# Patient Record
Sex: Male | Born: 1955
Health system: Southern US, Community
[De-identification: ages and names within clinical notes are randomized; demographics above are authoritative.]

## PROBLEM LIST (undated history)

## (undated) DIAGNOSIS — I509 Heart failure, unspecified: Secondary | ICD-10-CM

## (undated) DIAGNOSIS — E785 Hyperlipidemia, unspecified: Secondary | ICD-10-CM

## (undated) DIAGNOSIS — M545 Low back pain, unspecified: Secondary | ICD-10-CM

## (undated) DIAGNOSIS — G4733 Obstructive sleep apnea (adult) (pediatric): Secondary | ICD-10-CM

## (undated) DIAGNOSIS — R51 Headache: Secondary | ICD-10-CM

## (undated) DIAGNOSIS — R519 Headache, unspecified: Secondary | ICD-10-CM

## (undated) DIAGNOSIS — D126 Benign neoplasm of colon, unspecified: Secondary | ICD-10-CM

## (undated) DIAGNOSIS — M1990 Unspecified osteoarthritis, unspecified site: Secondary | ICD-10-CM

## (undated) DIAGNOSIS — E119 Type 2 diabetes mellitus without complications: Secondary | ICD-10-CM

## (undated) DIAGNOSIS — N529 Male erectile dysfunction, unspecified: Secondary | ICD-10-CM

## (undated) DIAGNOSIS — I10 Essential (primary) hypertension: Secondary | ICD-10-CM

## (undated) DIAGNOSIS — L03119 Cellulitis of unspecified part of limb: Secondary | ICD-10-CM

## (undated) DIAGNOSIS — M109 Gout, unspecified: Secondary | ICD-10-CM

## (undated) DIAGNOSIS — K5730 Diverticulosis of large intestine without perforation or abscess without bleeding: Secondary | ICD-10-CM

## (undated) DIAGNOSIS — L02419 Cutaneous abscess of limb, unspecified: Secondary | ICD-10-CM

## (undated) DIAGNOSIS — L0390 Cellulitis, unspecified: Secondary | ICD-10-CM

## (undated) HISTORY — DX: Cellulitis of unspecified part of limb: L03.119

## (undated) HISTORY — DX: Diverticulosis of large intestine without perforation or abscess without bleeding: K57.30

## (undated) HISTORY — DX: Hyperlipidemia, unspecified: E78.5

## (undated) HISTORY — DX: Low back pain, unspecified: M54.50

## (undated) HISTORY — DX: Obstructive sleep apnea (adult) (pediatric): G47.33

## (undated) HISTORY — DX: Morbid (severe) obesity due to excess calories: E66.01

## (undated) HISTORY — DX: Heart failure, unspecified: I50.9

## (undated) HISTORY — DX: Type 2 diabetes mellitus without complications: E11.9

## (undated) HISTORY — DX: Low back pain: M54.5

## (undated) HISTORY — PX: FOOT SURGERY: SHX648

## (undated) HISTORY — DX: Cutaneous abscess of limb, unspecified: L02.419

## (undated) HISTORY — DX: Gout, unspecified: M10.9

## (undated) HISTORY — DX: Essential (primary) hypertension: I10

## (undated) HISTORY — DX: Male erectile dysfunction, unspecified: N52.9

## (undated) HISTORY — PX: APPENDECTOMY: SHX54

## (undated) HISTORY — DX: Benign neoplasm of colon, unspecified: D12.6

---

## 2000-11-02 ENCOUNTER — Encounter: Payer: Self-pay | Admitting: Pulmonary Disease

## 2000-11-02 ENCOUNTER — Ambulatory Visit (HOSPITAL_BASED_OUTPATIENT_CLINIC_OR_DEPARTMENT_OTHER): Admission: RE | Admit: 2000-11-02 | Discharge: 2000-11-02 | Payer: Self-pay | Admitting: Internal Medicine

## 2000-12-01 ENCOUNTER — Encounter: Payer: Self-pay | Admitting: Pulmonary Disease

## 2001-02-16 ENCOUNTER — Encounter: Payer: Self-pay | Admitting: Occupational Medicine

## 2001-02-16 ENCOUNTER — Encounter: Admission: RE | Admit: 2001-02-16 | Discharge: 2001-02-16 | Payer: Self-pay | Admitting: Occupational Medicine

## 2001-05-18 ENCOUNTER — Inpatient Hospital Stay (HOSPITAL_COMMUNITY): Admission: EM | Admit: 2001-05-18 | Discharge: 2001-05-23 | Payer: Self-pay | Admitting: *Deleted

## 2001-05-18 ENCOUNTER — Encounter: Payer: Self-pay | Admitting: Internal Medicine

## 2001-05-30 ENCOUNTER — Encounter: Admission: RE | Admit: 2001-05-30 | Discharge: 2001-08-28 | Payer: Self-pay | Admitting: Internal Medicine

## 2002-05-15 ENCOUNTER — Inpatient Hospital Stay (HOSPITAL_COMMUNITY): Admission: EM | Admit: 2002-05-15 | Discharge: 2002-05-17 | Payer: Self-pay | Admitting: *Deleted

## 2003-07-06 ENCOUNTER — Ambulatory Visit (HOSPITAL_BASED_OUTPATIENT_CLINIC_OR_DEPARTMENT_OTHER): Admission: RE | Admit: 2003-07-06 | Discharge: 2003-07-06 | Payer: Self-pay | Admitting: Pulmonary Disease

## 2003-07-06 ENCOUNTER — Encounter: Payer: Self-pay | Admitting: Pulmonary Disease

## 2004-12-12 ENCOUNTER — Ambulatory Visit: Payer: Self-pay | Admitting: Internal Medicine

## 2004-12-16 ENCOUNTER — Inpatient Hospital Stay (HOSPITAL_COMMUNITY): Admission: EM | Admit: 2004-12-16 | Discharge: 2004-12-22 | Payer: Self-pay | Admitting: Internal Medicine

## 2004-12-16 ENCOUNTER — Ambulatory Visit: Payer: Self-pay | Admitting: Internal Medicine

## 2004-12-21 ENCOUNTER — Ambulatory Visit: Payer: Self-pay | Admitting: Internal Medicine

## 2004-12-31 ENCOUNTER — Ambulatory Visit: Payer: Self-pay | Admitting: Internal Medicine

## 2005-01-14 ENCOUNTER — Ambulatory Visit: Payer: Self-pay | Admitting: Internal Medicine

## 2005-01-26 ENCOUNTER — Ambulatory Visit: Payer: Self-pay | Admitting: Internal Medicine

## 2005-09-28 ENCOUNTER — Ambulatory Visit: Payer: Self-pay | Admitting: Internal Medicine

## 2005-11-05 ENCOUNTER — Ambulatory Visit: Payer: Self-pay | Admitting: Internal Medicine

## 2005-12-29 ENCOUNTER — Ambulatory Visit: Payer: Self-pay | Admitting: Internal Medicine

## 2006-01-04 ENCOUNTER — Ambulatory Visit: Payer: Self-pay | Admitting: Internal Medicine

## 2006-01-19 ENCOUNTER — Ambulatory Visit: Payer: Self-pay | Admitting: Internal Medicine

## 2006-01-25 ENCOUNTER — Ambulatory Visit: Payer: Self-pay | Admitting: Gastroenterology

## 2006-02-11 ENCOUNTER — Ambulatory Visit (HOSPITAL_COMMUNITY): Admission: RE | Admit: 2006-02-11 | Discharge: 2006-02-11 | Payer: Self-pay | Admitting: Internal Medicine

## 2006-02-11 ENCOUNTER — Encounter (INDEPENDENT_AMBULATORY_CARE_PROVIDER_SITE_OTHER): Payer: Self-pay | Admitting: *Deleted

## 2006-02-16 ENCOUNTER — Ambulatory Visit: Payer: Self-pay | Admitting: Internal Medicine

## 2006-03-02 ENCOUNTER — Ambulatory Visit: Payer: Self-pay | Admitting: Internal Medicine

## 2007-02-28 ENCOUNTER — Ambulatory Visit: Payer: Self-pay | Admitting: Internal Medicine

## 2007-03-07 ENCOUNTER — Encounter: Payer: Self-pay | Admitting: Internal Medicine

## 2007-03-07 ENCOUNTER — Ambulatory Visit (HOSPITAL_COMMUNITY): Admission: RE | Admit: 2007-03-07 | Discharge: 2007-03-07 | Payer: Self-pay | Admitting: Internal Medicine

## 2007-03-09 ENCOUNTER — Ambulatory Visit: Payer: Self-pay | Admitting: Internal Medicine

## 2007-05-21 ENCOUNTER — Encounter: Payer: Self-pay | Admitting: Internal Medicine

## 2007-05-21 DIAGNOSIS — E669 Obesity, unspecified: Secondary | ICD-10-CM | POA: Insufficient documentation

## 2007-05-21 DIAGNOSIS — I10 Essential (primary) hypertension: Secondary | ICD-10-CM

## 2007-05-21 DIAGNOSIS — E119 Type 2 diabetes mellitus without complications: Secondary | ICD-10-CM | POA: Insufficient documentation

## 2007-05-21 DIAGNOSIS — G4733 Obstructive sleep apnea (adult) (pediatric): Secondary | ICD-10-CM | POA: Insufficient documentation

## 2007-05-21 DIAGNOSIS — I509 Heart failure, unspecified: Secondary | ICD-10-CM | POA: Insufficient documentation

## 2007-05-21 DIAGNOSIS — F528 Other sexual dysfunction not due to a substance or known physiological condition: Secondary | ICD-10-CM | POA: Insufficient documentation

## 2007-05-21 DIAGNOSIS — M545 Low back pain: Secondary | ICD-10-CM

## 2007-05-21 DIAGNOSIS — M109 Gout, unspecified: Secondary | ICD-10-CM | POA: Insufficient documentation

## 2007-05-21 DIAGNOSIS — E1169 Type 2 diabetes mellitus with other specified complication: Secondary | ICD-10-CM | POA: Insufficient documentation

## 2007-05-21 DIAGNOSIS — E1122 Type 2 diabetes mellitus with diabetic chronic kidney disease: Secondary | ICD-10-CM | POA: Insufficient documentation

## 2007-06-06 ENCOUNTER — Ambulatory Visit: Payer: Self-pay | Admitting: Internal Medicine

## 2007-06-27 ENCOUNTER — Ambulatory Visit: Payer: Self-pay | Admitting: Internal Medicine

## 2007-06-27 ENCOUNTER — Encounter: Payer: Self-pay | Admitting: Internal Medicine

## 2007-06-27 DIAGNOSIS — L02419 Cutaneous abscess of limb, unspecified: Secondary | ICD-10-CM

## 2007-06-27 DIAGNOSIS — E785 Hyperlipidemia, unspecified: Secondary | ICD-10-CM | POA: Insufficient documentation

## 2007-06-27 DIAGNOSIS — L03119 Cellulitis of unspecified part of limb: Secondary | ICD-10-CM | POA: Insufficient documentation

## 2007-06-27 HISTORY — DX: Cellulitis of unspecified part of limb: L02.419

## 2007-11-10 ENCOUNTER — Telehealth (INDEPENDENT_AMBULATORY_CARE_PROVIDER_SITE_OTHER): Payer: Self-pay | Admitting: *Deleted

## 2008-03-08 ENCOUNTER — Telehealth (INDEPENDENT_AMBULATORY_CARE_PROVIDER_SITE_OTHER): Payer: Self-pay | Admitting: *Deleted

## 2008-03-13 ENCOUNTER — Telehealth (INDEPENDENT_AMBULATORY_CARE_PROVIDER_SITE_OTHER): Payer: Self-pay | Admitting: *Deleted

## 2008-03-14 ENCOUNTER — Encounter: Payer: Self-pay | Admitting: Internal Medicine

## 2008-03-14 ENCOUNTER — Telehealth (INDEPENDENT_AMBULATORY_CARE_PROVIDER_SITE_OTHER): Payer: Self-pay | Admitting: *Deleted

## 2008-03-14 DIAGNOSIS — D126 Benign neoplasm of colon, unspecified: Secondary | ICD-10-CM | POA: Insufficient documentation

## 2008-03-14 DIAGNOSIS — K5730 Diverticulosis of large intestine without perforation or abscess without bleeding: Secondary | ICD-10-CM | POA: Insufficient documentation

## 2008-03-22 ENCOUNTER — Ambulatory Visit: Payer: Self-pay | Admitting: Internal Medicine

## 2008-03-23 ENCOUNTER — Telehealth: Payer: Self-pay | Admitting: Internal Medicine

## 2008-03-26 ENCOUNTER — Encounter: Payer: Self-pay | Admitting: Internal Medicine

## 2008-03-26 ENCOUNTER — Ambulatory Visit (HOSPITAL_COMMUNITY): Admission: RE | Admit: 2008-03-26 | Discharge: 2008-03-26 | Payer: Self-pay | Admitting: Internal Medicine

## 2008-03-27 ENCOUNTER — Encounter: Payer: Self-pay | Admitting: Internal Medicine

## 2008-04-02 ENCOUNTER — Ambulatory Visit: Payer: Self-pay | Admitting: Internal Medicine

## 2008-04-18 ENCOUNTER — Ambulatory Visit: Payer: Self-pay | Admitting: Pulmonary Disease

## 2008-08-28 ENCOUNTER — Ambulatory Visit: Payer: Self-pay | Admitting: Internal Medicine

## 2008-08-28 DIAGNOSIS — J019 Acute sinusitis, unspecified: Secondary | ICD-10-CM

## 2008-08-29 LAB — CONVERTED CEMR LAB
ALT: 40 units/L (ref 0–53)
AST: 27 units/L (ref 0–37)
Albumin: 3.9 g/dL (ref 3.5–5.2)
Alkaline Phosphatase: 70 units/L (ref 39–117)
BUN: 9 mg/dL (ref 6–23)
Basophils Absolute: 0 10*3/uL (ref 0.0–0.1)
Basophils Relative: 0.6 % (ref 0.0–3.0)
Bilirubin Urine: NEGATIVE
Bilirubin, Direct: 0.1 mg/dL (ref 0.0–0.3)
CO2: 31 meq/L (ref 19–32)
Calcium: 9.1 mg/dL (ref 8.4–10.5)
Chloride: 108 meq/L (ref 96–112)
Cholesterol: 172 mg/dL (ref 0–200)
Creatinine, Ser: 0.8 mg/dL (ref 0.4–1.5)
Creatinine,U: 209.2 mg/dL
Direct LDL: 93.8 mg/dL
Eosinophils Absolute: 0.2 10*3/uL (ref 0.0–0.7)
Eosinophils Relative: 3.1 % (ref 0.0–5.0)
GFR calc Af Amer: 130 mL/min
GFR calc non Af Amer: 107 mL/min
Glucose, Bld: 195 mg/dL — ABNORMAL HIGH (ref 70–99)
HCT: 39 % (ref 39.0–52.0)
HDL: 40.1 mg/dL (ref >39.0)
Hemoglobin, Urine: NEGATIVE
Hemoglobin: 13.9 g/dL (ref 13.0–17.0)
Hgb A1c MFr Bld: 7.2 % — ABNORMAL HIGH (ref 4.6–6.0)
Ketones, ur: NEGATIVE mg/dL
Leukocytes, UA: NEGATIVE
Lymphocytes Relative: 21.8 % (ref 12.0–46.0)
MCHC: 35.6 g/dL (ref 30.0–36.0)
MCV: 89.8 fL (ref 78.0–100.0)
Microalb Creat Ratio: 41.1 mg/g — ABNORMAL HIGH (ref 0.0–30.0)
Microalb, Ur: 8.6 mg/dL — ABNORMAL HIGH (ref 0.0–1.9)
Monocytes Absolute: 0.6 10*3/uL (ref 0.1–1.0)
Monocytes Relative: 10 % (ref 3.0–12.0)
Neutro Abs: 3.6 10*3/uL (ref 1.4–7.7)
Neutrophils Relative %: 64.5 % (ref 43.0–77.0)
Nitrite: NEGATIVE
PSA: 0.31 ng/mL (ref 0.10–4.00)
Platelets: 189 10*3/uL (ref 150–400)
Potassium: 4.5 meq/L (ref 3.5–5.1)
RBC: 4.34 M/uL (ref 4.22–5.81)
RDW: 13.5 % (ref 11.5–14.6)
Sodium: 144 meq/L (ref 135–145)
Specific Gravity, Urine: >=1.03 (ref 1.000–1.03)
TSH: 1.69 microintl units/mL (ref 0.35–5.50)
Total Bilirubin: 0.7 mg/dL (ref 0.3–1.2)
Total CHOL/HDL Ratio: 4.3
Total Protein: 7.6 g/dL (ref 6.0–8.3)
Triglycerides: 225 mg/dL (ref 0–149)
Urine Glucose: NEGATIVE mg/dL
Urobilinogen, UA: 0.2 (ref 0.0–1.0)
VLDL: 45 mg/dL — ABNORMAL HIGH (ref 0–40)
WBC: 5.6 10*3/uL (ref 4.5–10.5)
pH: 5.5 (ref 5.0–8.0)

## 2009-02-21 ENCOUNTER — Telehealth (INDEPENDENT_AMBULATORY_CARE_PROVIDER_SITE_OTHER): Payer: Self-pay

## 2009-03-18 ENCOUNTER — Ambulatory Visit: Payer: Self-pay | Admitting: Internal Medicine

## 2009-04-01 ENCOUNTER — Ambulatory Visit: Payer: Self-pay | Admitting: Internal Medicine

## 2009-04-01 ENCOUNTER — Encounter: Payer: Self-pay | Admitting: Internal Medicine

## 2009-04-01 ENCOUNTER — Ambulatory Visit (HOSPITAL_COMMUNITY): Admission: RE | Admit: 2009-04-01 | Discharge: 2009-04-01 | Payer: Self-pay | Admitting: Internal Medicine

## 2009-04-01 LAB — HM COLONOSCOPY

## 2009-04-02 ENCOUNTER — Encounter: Payer: Self-pay | Admitting: Internal Medicine

## 2009-04-24 ENCOUNTER — Ambulatory Visit: Payer: Self-pay | Admitting: Internal Medicine

## 2009-04-24 DIAGNOSIS — N529 Male erectile dysfunction, unspecified: Secondary | ICD-10-CM

## 2009-07-06 ENCOUNTER — Ambulatory Visit: Payer: Self-pay | Admitting: Family Medicine

## 2009-07-08 ENCOUNTER — Encounter: Payer: Self-pay | Admitting: Internal Medicine

## 2009-09-02 ENCOUNTER — Ambulatory Visit: Payer: Self-pay | Admitting: Internal Medicine

## 2009-09-02 LAB — CONVERTED CEMR LAB
ALT: 26 units/L (ref 0–53)
AST: 21 units/L (ref 0–37)
Albumin: 4 g/dL (ref 3.5–5.2)
Alkaline Phosphatase: 54 units/L (ref 39–117)
BUN: 15 mg/dL (ref 6–23)
Basophils Absolute: 0 10*3/uL (ref 0.0–0.1)
Basophils Relative: 0 % (ref 0.0–3.0)
Bilirubin Urine: NEGATIVE
Bilirubin, Direct: 0.1 mg/dL (ref 0.0–0.3)
CO2: 28 meq/L (ref 19–32)
Calcium: 9.2 mg/dL (ref 8.4–10.5)
Chloride: 100 meq/L (ref 96–112)
Cholesterol: 196 mg/dL (ref 0–200)
Creatinine, Ser: 0.8 mg/dL (ref 0.4–1.5)
Creatinine,U: 107.3 mg/dL
Eosinophils Absolute: 0.2 10*3/uL (ref 0.0–0.7)
Eosinophils Relative: 1.9 % (ref 0.0–5.0)
GFR calc non Af Amer: 106.79 mL/min (ref >60)
Glucose, Bld: 142 mg/dL — ABNORMAL HIGH (ref 70–99)
HCT: 42.8 % (ref 39.0–52.0)
HDL: 51.7 mg/dL (ref >39.00)
Hemoglobin, Urine: NEGATIVE
Hemoglobin: 14.4 g/dL (ref 13.0–17.0)
Hgb A1c MFr Bld: 6.9 % — ABNORMAL HIGH (ref 4.6–6.5)
Ketones, ur: NEGATIVE mg/dL
LDL Cholesterol: 108 mg/dL — ABNORMAL HIGH (ref 0–99)
Leukocytes, UA: NEGATIVE
Lymphocytes Relative: 14.7 % (ref 12.0–46.0)
Lymphs Abs: 1.5 10*3/uL (ref 0.7–4.0)
MCHC: 33.7 g/dL (ref 30.0–36.0)
MCV: 93.4 fL (ref 78.0–100.0)
Microalb Creat Ratio: 45.7 mg/g — ABNORMAL HIGH (ref 0.0–30.0)
Microalb, Ur: 4.9 mg/dL — ABNORMAL HIGH (ref 0.0–1.9)
Monocytes Absolute: 0.5 10*3/uL (ref 0.1–1.0)
Monocytes Relative: 5.3 % (ref 3.0–12.0)
Neutro Abs: 8.1 10*3/uL — ABNORMAL HIGH (ref 1.4–7.7)
Neutrophils Relative %: 78.1 % — ABNORMAL HIGH (ref 43.0–77.0)
Nitrite: NEGATIVE
PSA: 0.26 ng/mL (ref 0.10–4.00)
Platelets: 167 10*3/uL (ref 150.0–400.0)
Potassium: 4 meq/L (ref 3.5–5.1)
RBC: 4.58 M/uL (ref 4.22–5.81)
RDW: 12.8 % (ref 11.5–14.6)
Sodium: 138 meq/L (ref 135–145)
Specific Gravity, Urine: 1.025 (ref 1.000–1.030)
TSH: 3.33 microintl units/mL (ref 0.35–5.50)
Total Bilirubin: 0.9 mg/dL (ref 0.3–1.2)
Total CHOL/HDL Ratio: 4
Total Protein, Urine: NEGATIVE mg/dL
Total Protein: 7.7 g/dL (ref 6.0–8.3)
Triglycerides: 180 mg/dL — ABNORMAL HIGH (ref 0.0–149.0)
Urine Glucose: NEGATIVE mg/dL
Urobilinogen, UA: 0.2 (ref 0.0–1.0)
VLDL: 36 mg/dL (ref 0.0–40.0)
WBC: 10.3 10*3/uL (ref 4.5–10.5)
pH: 5.5 (ref 5.0–8.0)

## 2009-09-05 ENCOUNTER — Ambulatory Visit: Payer: Self-pay | Admitting: Internal Medicine

## 2009-09-05 DIAGNOSIS — Z86010 Personal history of colonic polyps: Secondary | ICD-10-CM | POA: Insufficient documentation

## 2009-09-13 ENCOUNTER — Telehealth: Payer: Self-pay | Admitting: Internal Medicine

## 2009-10-30 ENCOUNTER — Encounter (INDEPENDENT_AMBULATORY_CARE_PROVIDER_SITE_OTHER): Payer: Self-pay | Admitting: *Deleted

## 2010-01-31 ENCOUNTER — Telehealth: Payer: Self-pay | Admitting: Internal Medicine

## 2010-02-07 ENCOUNTER — Encounter: Payer: Self-pay | Admitting: Internal Medicine

## 2010-02-27 ENCOUNTER — Ambulatory Visit: Payer: Self-pay | Admitting: Internal Medicine

## 2010-02-27 LAB — CONVERTED CEMR LAB
ALT: 39 units/L (ref 0–53)
AST: 21 units/L (ref 0–37)
Albumin: 4.2 g/dL (ref 3.5–5.2)
Alkaline Phosphatase: 62 units/L (ref 39–117)
BUN: 17 mg/dL (ref 6–23)
Bilirubin, Direct: 0.2 mg/dL (ref 0.0–0.3)
CO2: 27 meq/L (ref 19–32)
Calcium: 9.3 mg/dL (ref 8.4–10.5)
Chloride: 102 meq/L (ref 96–112)
Cholesterol: 166 mg/dL (ref 0–200)
Creatinine, Ser: 0.9 mg/dL (ref 0.4–1.5)
Direct LDL: 96 mg/dL
GFR calc non Af Amer: 99.39 mL/min (ref >60)
Glucose, Bld: 209 mg/dL — ABNORMAL HIGH (ref 70–99)
HDL: 45.7 mg/dL (ref >39.00)
Hgb A1c MFr Bld: 8.6 % — ABNORMAL HIGH (ref 4.6–6.5)
Potassium: 4 meq/L (ref 3.5–5.1)
Sodium: 139 meq/L (ref 135–145)
Total Bilirubin: 1.2 mg/dL (ref 0.3–1.2)
Total CHOL/HDL Ratio: 4
Total Protein: 7.9 g/dL (ref 6.0–8.3)
Triglycerides: 211 mg/dL — ABNORMAL HIGH (ref 0.0–149.0)
VLDL: 42.2 mg/dL — ABNORMAL HIGH (ref 0.0–40.0)

## 2010-03-03 ENCOUNTER — Ambulatory Visit: Payer: Self-pay | Admitting: Internal Medicine

## 2010-03-20 ENCOUNTER — Encounter (INDEPENDENT_AMBULATORY_CARE_PROVIDER_SITE_OTHER): Payer: Self-pay | Admitting: *Deleted

## 2010-03-24 ENCOUNTER — Encounter (INDEPENDENT_AMBULATORY_CARE_PROVIDER_SITE_OTHER): Payer: Self-pay | Admitting: *Deleted

## 2010-03-24 ENCOUNTER — Ambulatory Visit: Payer: Self-pay | Admitting: Internal Medicine

## 2010-04-02 ENCOUNTER — Ambulatory Visit (HOSPITAL_COMMUNITY): Admission: RE | Admit: 2010-04-02 | Discharge: 2010-04-02 | Payer: Self-pay | Admitting: Internal Medicine

## 2010-04-02 ENCOUNTER — Ambulatory Visit: Payer: Self-pay | Admitting: Internal Medicine

## 2010-04-02 ENCOUNTER — Encounter: Payer: Self-pay | Admitting: Internal Medicine

## 2010-08-27 ENCOUNTER — Ambulatory Visit: Payer: Self-pay | Admitting: Internal Medicine

## 2010-09-03 ENCOUNTER — Ambulatory Visit: Payer: Self-pay | Admitting: Internal Medicine

## 2010-10-09 NOTE — Assessment & Plan Note (Signed)
Summary: 6 mos f/u #/cd   Vital Signs:  Patient profile:   55 year old male Height:      72 inches Weight:      422 pounds BMI:     57.44 O2 Sat:      95 % on Room air Temp:     99.2 degrees F oral Pulse rate:   90 / minute BP sitting:   154 / 100  (left arm) Cuff size:   large  Vitals Entered By: Zella Ball Ewing CMA (AAMA) (March 03, 2010 3:22 PM)  O2 Flow:  Room air CC: 6 Month Followup/RE   Primary Care Provider:  Jonny Ruiz  CC:  6 Month Followup/RE.  History of Present Illness: BP at home similar to today - mild elevated;  Pt denies CP, sob, doe, wheezing, orthopnea, pnd, worsening LE edema, palps, dizziness or syncope Pt denies new neuro symptoms such as headache, facial or extremity weakness  Pt denies polydipsia, polyuria, or low sugar symptoms such as shakiness improved with eating.  Overall good compliance with meds, trying to follow low chol, DM diet, wt stable, little excercise however   Problems Prior to Update: 1)  Colonic Polyps, Hx of  (ICD-V12.72) 2)  Erectile Dysfunction, Organic  (ICD-607.84) 3)  Sinusitis- Acute-nos  (ICD-461.9) 4)  Preventive Health Care  (ICD-V70.0) 5)  Diverticulosis, Colon  (ICD-562.10) 6)  Colonic Polyps  (ICD-211.3) 7)  Cellulitis/abscess, Leg  (ICD-682.6) 8)  Erectile Dysfunction  (ICD-302.72) 9)  Hyperlipidemia  (ICD-272.4) 10)  Family History Diabetes 1st Degree Relative  (ICD-V18.0) 11)  Family History of Asthma  (ICD-V17.5) 12)  Low Back Pain  (ICD-724.2) 13)  Hypertension  (ICD-401.9) 14)  Diabetes Mellitus, Type II  (ICD-250.00) 15)  Sleep Apnea, Obstructive  (ICD-327.23) 16)  Morbid Obesity  (ICD-278.01) 17)  Gout Nos  (ICD-274.9) 18)  Erectile Dysfunction  (ICD-302.72) 19)  Congestive Heart Failure  (ICD-428.0)  Medications Prior to Update: 1)  Colchicine 0.6 Mg Tabs (Colchicine) .... Take 1 Tablet By Mouth Once A Day As Needed 2)  Furosemide 80 Mg Tabs (Furosemide) .... Take 1 Tablet By Mouth Twice A Day 3)  Glimepiride 4  Mg Tabs (Glimepiride) .... Take 1 Tablet By Mouth Once A Day 4)  Klor-Con M20 20 Meq Tbcr (Potassium Chloride Crys Cr) .... Take 2 Tablet By Mouth Once A Day 5)  Losartan Potassium 100 Mg Tabs (Losartan Potassium) .Marland Kitchen.. 1po Once Daily 6)  Adult Aspirin Ec Low Strength 81 Mg Tbec (Aspirin) .Marland Kitchen.. 1 By Mouth Once Daily 7)  Metformin Hcl 500 Mg Xr24h-Tab (Metformin Hcl) .... 2 By Mouth Each Morning 8)  Cialis 20 Mg Tabs (Tadalafil) .Marland Kitchen.. 1 By Mouth Every Other Day 9)  Allopurinol 300 Mg Tabs (Allopurinol) .Marland Kitchen.. 1 By Mouth Once Daily 10)  Ultram 50 Mg Tabs (Tramadol Hcl) .Marland Kitchen.. 1-2 By Mouth Every 6 Hours As Needed 11)  Pravachol 20 Mg Tabs (Pravastatin Sodium) .Marland Kitchen.. 1 By Mouth Once Daily  Current Medications (verified): 1)  Colchicine 0.6 Mg Tabs (Colchicine) .... Take 1 Tablet By Mouth Once A Day As Needed 2)  Furosemide 80 Mg Tabs (Furosemide) .... Take 1 Tablet By Mouth Twice A Day 3)  Glimepiride 4 Mg Tabs (Glimepiride) .... Take 1 Tablet By Mouth Once A Day 4)  Klor-Con M20 20 Meq Tbcr (Potassium Chloride Crys Cr) .... Take 2 Tablet By Mouth Once A Day 5)  Azor 5-40 Mg Tabs (Amlodipine-Olmesartan) .Marland Kitchen.. 1po Once Daily 6)  Adult Aspirin Ec Low Strength 81 Mg  Tbec (Aspirin) .Marland Kitchen.. 1 By Mouth Once Daily 7)  Metformin Hcl 500 Mg Xr24h-Tab (Metformin Hcl) .... 2 By Mouth Each Morning 8)  Cialis 20 Mg Tabs (Tadalafil) .Marland Kitchen.. 1 By Mouth Every Other Day 9)  Allopurinol 300 Mg Tabs (Allopurinol) .Marland Kitchen.. 1 By Mouth Once Daily 10)  Pravachol 40 Mg Tabs (Pravastatin Sodium) .Marland Kitchen.. 1po Once Daily 11)  Onetouch Ultra Test  Strp (Glucose Blood) .... Use Asd 1 Once Daily 12)  Lancets  Misc (Lancets) .... Use Asd 1 Once Daily  Allergies (verified): 1)  ! Pcn  Past History:  Past Medical History: Last updated: 09/05/2009 Congestive heart failure Erectile Dysfunction Gout Obstructive Sleep Apnea- CPAP Morbid Obesity Diabetes mellitus, type II Hypertension Low back pain Hyperlipidemia E.D. Colonic polyps, hx  of  Past Surgical History: Last updated: 05/21/2007 Appendectomy L Foot Surgery  Social History: Last updated: 08/28/2008 Never Smoked Alcohol use-yes pt is married. 3 children work - Airline pilot - Youth worker  Risk Factors: Smoking Status: never (06/27/2007)  Review of Systems       all otherwise negative per pt -    Physical Exam  General:  alert and overweight-appearing.   Head:  normocephalic and atraumatic.   Eyes:  vision grossly intact, pupils equal, and pupils round.   Ears:  R ear normal and L ear normal.   Nose:  no external deformity and no nasal discharge.   Mouth:  no gingival abnormalities and pharynx pink and moist.   Neck:  supple and no JVD.   Lungs:  normal respiratory effort and normal breath sounds.   Heart:  normal rate and regular rhythm.   Extremities:  trace edema bilat   Impression & Recommendations:  Problem # 1:  HYPERTENSION (ICD-401.9)  His updated medication list for this problem includes:    Furosemide 80 Mg Tabs (Furosemide) .Marland Kitchen... Take 1 tablet by mouth twice a day    Azor 5-40 Mg Tabs (Amlodipine-olmesartan) .Marland Kitchen... 1po once daily uncontrolled - to chang losartan to azor 5/40  - 1 per day  BP today: 154/100 Prior BP: 160/92 (09/05/2009)  Labs Reviewed: K+: 4.0 (02/27/2010) Creat: : 0.9 (02/27/2010)   Chol: 166 (02/27/2010)   HDL: 45.70 (02/27/2010)   LDL: 108 (09/02/2009)   TG: 211.0 (02/27/2010) uncontroled, to change meds as above  Problem # 2:  DIABETES MELLITUS, TYPE II (ICD-250.00)  His updated medication list for this problem includes:    Glimepiride 4 Mg Tabs (Glimepiride) .Marland Kitchen... Take 1 tablet by mouth once a day    Azor 5-40 Mg Tabs (Amlodipine-olmesartan) .Marland Kitchen... 1po once daily    Adult Aspirin Ec Low Strength 81 Mg Tbec (Aspirin) .Marland Kitchen... 1 by mouth once daily    Metformin Hcl 500 Mg Xr24h-tab (Metformin hcl) .Marland Kitchen... 2 by mouth each morning  Labs Reviewed: Creat: 0.9 (02/27/2010)    Reviewed HgBA1c  results: 8.6 (02/27/2010)  6.9 (09/02/2009) stable overall by hx and exam, ok to continue meds/tx as is   Problem # 3:  HYPERLIPIDEMIA (ICD-272.4)  His updated medication list for this problem includes:    Pravachol 40 Mg Tabs (Pravastatin sodium) .Marland Kitchen... 1po once daily  Labs Reviewed: SGOT: 21 (02/27/2010)   SGPT: 39 (02/27/2010)   HDL:45.70 (02/27/2010), 51.70 (09/02/2009)  LDL:108 (09/02/2009), DEL (08/28/2008)  Chol:166 (02/27/2010), 196 (09/02/2009)  Trig:211.0 (02/27/2010), 180.0 (09/02/2009) stable overall by hx and exam, ok to continue meds/tx as is   Problem # 4:  CONGESTIVE HEART FAILURE (ICD-428.0)  His updated medication list for this problem  includes:    Furosemide 80 Mg Tabs (Furosemide) .Marland Kitchen... Take 1 tablet by mouth twice a day    Azor 5-40 Mg Tabs (Amlodipine-olmesartan) .Marland Kitchen... 1po once daily    Adult Aspirin Ec Low Strength 81 Mg Tbec (Aspirin) .Marland Kitchen... 1 by mouth once daily stable overall by hx and exam, ok to continue meds/tx as is   Complete Medication List: 1)  Colchicine 0.6 Mg Tabs (Colchicine) .... Take 1 tablet by mouth once a day as needed 2)  Furosemide 80 Mg Tabs (Furosemide) .... Take 1 tablet by mouth twice a day 3)  Glimepiride 4 Mg Tabs (Glimepiride) .... Take 1 tablet by mouth once a day 4)  Klor-con M20 20 Meq Tbcr (Potassium chloride crys cr) .... Take 2 tablet by mouth once a day 5)  Azor 5-40 Mg Tabs (Amlodipine-olmesartan) .Marland Kitchen.. 1po once daily 6)  Adult Aspirin Ec Low Strength 81 Mg Tbec (Aspirin) .Marland Kitchen.. 1 by mouth once daily 7)  Metformin Hcl 500 Mg Xr24h-tab (Metformin hcl) .... 2 by mouth each morning 8)  Cialis 20 Mg Tabs (Tadalafil) .Marland Kitchen.. 1 by mouth every other day 9)  Allopurinol 300 Mg Tabs (Allopurinol) .Marland Kitchen.. 1 by mouth once daily 10)  Pravachol 40 Mg Tabs (Pravastatin sodium) .Marland Kitchen.. 1po once daily 11)  Onetouch Ultra Test Strp (Glucose blood) .... Use asd 1 once daily 12)  Lancets Misc (Lancets) .... Use asd 1 once daily  Patient Instructions: 1)   stop the losartan 2)  start the samples on the azor 5/40 mg - 1 per day 3)  re-start the diabetes medications as before 4)  increase the pravachol to 40 mg per day 5)  Continue all other previous medications as before this visit  6)  Please schedule a follow-up appointment in 6 months with CPX labs and: 7)  HbgA1C prior to visit, ICD-9: 250.02 8)  Urine Microalbumin prior to visit, ICD-9: Prescriptions: LANCETS  MISC (LANCETS) use asd 1 once daily  #100 x 11   Entered and Authorized by:   Corwin Levins MD   Signed by:   Corwin Levins MD on 03/03/2010   Method used:   [facility]  [address] [id number]       [address]       [place], [place]  [address]       Ph: [phone] or [phone]       Fax: [phone]   RxID:   [id number] [place] TEST  STRP (GLUCOSE BLOOD) use asd 1 once daily  #100 x 11   Entered and Authorized by:   Corwin Levins MD   Signed by:   Corwin Levins MD on 03/03/2010   Method used:   [facility]        [facility]  [address] [id number]       [address]       [place], [place]  [address]       Ph: [phone] or [phone]       Fax: [phone]   RxID:   [id number] FUROSEMIDE 80 MG TABS (FUROSEMIDE) Take 1 tablet by mouth twice a day  #180 x 3   Entered and Authorized by:   Corwin Levins MD   Signed by:   Corwin Levins MD on 03/03/2010   Method used:   Print then Give to Patient  RxID:   [id number] GLIMEPIRIDE 4 MG TABS (GLIMEPIRIDE) Take 1 tablet by mouth once a day  #90 x 3   Entered and Authorized by:   Corwin Levins MD   Signed by:   Corwin Levins MD on 03/03/2010   Method used:   Print then Give to Patient   RxID:   [id number] KLOR-CON M20 20 MEQ TBCR (POTASSIUM CHLORIDE CRYS CR) Take 2 tablet by mouth once a day  #180 x 3   Entered and Authorized by:   Corwin Levins MD   Signed by:   Corwin Levins MD on 03/03/2010   Method used:   Print then Give to  Patient   RxID:   [id number] METFORMIN HCL 500 MG XR24H-TAB (METFORMIN HCL) 2 by mouth each morning  #180 x 3   Entered and Authorized by:   Corwin Levins MD   Signed by:   Corwin Levins MD on 03/03/2010   Method used:   Print then Give to Patient   RxID:   [id number] CIALIS 20 MG TABS (TADALAFIL) 1 by mouth every other day  #15 x 3   Entered and Authorized by:   Corwin Levins MD   Signed by:   Corwin Levins MD on 03/03/2010   Method used:   Print then Give to Patient   RxID:   [id number] ALLOPURINOL 300 MG TABS (ALLOPURINOL) 1 by mouth once daily  #90 x 3   Entered and Authorized by:   Corwin Levins MD   Signed by:   Corwin Levins MD on 03/03/2010   Method used:   Print then Give to Patient   RxID:   [id number] PRAVACHOL 40 MG TABS (PRAVASTATIN SODIUM) 1po once daily  #90 x 3   Entered and Authorized by:   Corwin Levins MD   Signed by:   Corwin Levins MD on 03/03/2010   Method used:   Print then Give to Patient   RxID:   [id number] AZOR 5-40 MG TABS (AMLODIPINE-OLMESARTAN) 1po once daily  #90 x 3   Entered and Authorized by:   Corwin Levins MD   Signed by:   Corwin Levins MD on 03/03/2010   Method used:   Print then Give to Patient   RxID:   [id number]

## 2010-10-09 NOTE — Procedures (Signed)
Summary: Medication List/[facility]  [facility]/ [facility]   Imported By: Sherian Rein 03/31/2010 15:20:54  _____________________________________________________________________  External Attachment:    Type:   Image     Comment:   External Document

## 2010-10-09 NOTE — Procedures (Signed)
Summary: Colonoscopy / [facility]  Colonoscopy / [facility]   Imported By: Lennie Odor 04/15/2010 16:24:35  _____________________________________________________________________  External Attachment:    Type:   Image     Comment:   External Document

## 2010-10-09 NOTE — Letter (Signed)
Summary: Colonoscopy Letter  [facility]  [address] [place], [place] [address]   Phone: [phone]  Fax: [phone]      October 30, 2009 MRN: [id number]   [facility] [address] [place], [place]  [address]   Dear Mr. HERITAGE,   According to your medical record, it is time for you to schedule a Colonoscopy. The American Cancer Society recommends this procedure as a method to detect early colon cancer. Patients with a family history of colon cancer, or a personal history of colon polyps or inflammatory bowel disease are at increased risk.  This letter has beeen generated based on the recommendations made at the time of your procedure. If you feel that in your particular situation this may no longer apply, please contact our office.  Please call our office at [phone] to schedule this appointment or to update your records at your earliest convenience.  Thank you for cooperating with [place] to provide you with the very best care possible.   Sincerely,  Hedwig Morton. Juanda Chance, M.D.  [facility] [phone]

## 2010-10-09 NOTE — Letter (Signed)
Summary: Diabetic Instructions  [facility]  [address]   [place], [place] [address]   Phone: [phone]  Fax: [phone]    Gerald Day 12-21-1955 MRN: [id number]   _x  _   ORAL DIABETIC MEDICATION INSTRUCTIONS  The day before your procedure:   Take your diabetic pill as you do normally  The day of your procedure:   Do not take your diabetic pill    We will check your blood sugar levels during the admission process and again in Recovery before discharging you home  ________________________________________________________________________

## 2010-10-09 NOTE — Letter (Signed)
Summary: [facility] Instructions  [facility]  [address] [place], [place] [address]   Phone: [phone]  Fax: [phone]       Gerald Day    03/13/1956    MRN: [id number]        Procedure Day /Date:  04/02/10  Wednesday     Arrival Time:  8:00am      Procedure Time: 9:00am     Location of Procedure:                    _x [facility]   PREPARATION FOR COLONOSCOPY WITH MOVIPREP   Starting 5 days prior to your procedure _ 7/22/11_ do not eat nuts, seeds, popcorn, corn, beans, peas,  salads, or any raw vegetables.  Do not take any fiber supplements (e.g. Metamucil, Citrucel, and Benefiber).  THE DAY BEFORE YOUR PROCEDURE         DATE:   04/01/10  DAY:   Tuesday  1.  Drink clear liquids the entire day-NO SOLID FOOD  2.  Do not drink anything colored red or purple.  Avoid juices with pulp.  No orange juice.  3.  Drink at least 64 oz. (8 glasses) of fluid/clear liquids during the day to prevent dehydration and help the prep work efficiently.  CLEAR LIQUIDS INCLUDE: Water Jello Ice Popsicles Tea (sugar ok, no milk/cream) Powdered fruit flavored drinks Coffee (sugar ok, no milk/cream) Gatorade Juice: apple, white grape, white cranberry  Lemonade Clear bullion, consomm, broth Carbonated beverages (any kind) Strained chicken noodle soup Hard Candy                             4.  In the morning, mix first dose of MoviPrep solution:    Empty 1 Pouch A and 1 Pouch B into the disposable container    Add lukewarm drinking water to the top line of the container. Mix to dissolve    Refrigerate (mixed solution should be used within 24 hrs)  5.  Begin drinking the prep at 5:00 p.m. The MoviPrep container is divided by 4 marks.   Every 15 minutes drink the solution down to the next mark (approximately 8 oz) until the full liter is complete.   6.  Follow completed prep with 16 oz of clear liquid of your choice (Nothing red or purple).  Continue to  drink clear liquids until bedtime.  7.  Before going to bed, mix second dose of MoviPrep solution:    Empty 1 Pouch A and 1 Pouch B into the disposable container    Add lukewarm drinking water to the top line of the container. Mix to dissolve    Refrigerate  THE DAY OF YOUR PROCEDURE      DATE:   04/02/10 DAY:  Wednesday  Beginning at  4:00 a.m. (5 hours before procedure):         1. Every 15 minutes, drink the solution down to the next mark (approx 8 oz) until the full liter is complete.  2. Follow completed prep with 16 oz. of clear liquid of your choice.    3. You may drink clear liquids until  5:00am (4 HOURS BEFORE PROCEDURE).   MEDICATION INSTRUCTIONS  Unless otherwise instructed, you should take regular prescription medications with a small sip of water   as early as possible the morning of your procedure.  Diabetic patients - see separate instructions.  OTHER INSTRUCTIONS  You will need a responsible adult at least 56 years of age to accompany you and drive you home.   This person must remain in the waiting room during your procedure.  Wear loose fitting clothing that is easily removed.  Leave jewelry and other valuables at home.  However, you may wish to bring a book to read or  an iPod/MP3 player to listen to music as you wait for your procedure to start.  Remove all body piercing jewelry and leave at home.  Total time from sign-in until discharge is approximately 2-3 hours.  You should go home directly after your procedure and rest.  You can resume normal activities the  day after your procedure.  The day of your procedure you should not:   Drive   Make legal decisions   Operate machinery   Drink alcohol   Return to work  You will receive specific instructions about eating, activities and medications before you leave.    The above instructions have been reviewed and explained to me by   Karl Bales RN  March 24, 2010 3:45  P.M.    I fully understand and can verbalize these instructions _____________________________ Date _________

## 2010-10-09 NOTE — Miscellaneous (Signed)
Summary: [facility] previsit  Clinical Lists Changes  Medications: Added new medication of MOVIPREP 100 GM  SOLR (PEG-KCL-NACL-NASULF-NA ASC-C) As per prep instructions. - Signed Rx of MOVIPREP 100 GM  SOLR (PEG-KCL-NACL-NASULF-NA ASC-C) As per prep instructions.;  #1 x 0;  Signed;  Entered by: Karl Bales RN;  Authorized by: Hilarie Fredrickson MD;  Method [facility]  [address] [id number] [address], [place], [place]  [address], Ph: [phone] or [phone], Fax: [phone]    Prescriptions: MOVIPREP 100 GM  SOLR (PEG-KCL-NACL-NASULF-NA ASC-C) As per prep instructions.  #1 x 0   Entered by:   Karl Bales RN   Authorized by:   Hilarie Fredrickson MD   Signed by:   Karl Bales RN on 03/24/2010   Method used:   [facility]        [facility]  [address] [id number]       [address]       [place], [place]  [address]       Ph: [phone] or [phone]       Fax: [phone]   RxID:   [id number]

## 2010-10-09 NOTE — Progress Notes (Signed)
Summary: Gout RX needed  Phone Note Call from Patient Call back at Home Phone [phone]   Summary of Call: Patient called triage and left message c/o problems with gout in his ankle. Patient needs colchine/allopurinol. If ok, how many refills can patient have? Please advise, thanks Initial call taken by: Lucious Groves,  September 13, 2009 8:43 AM  Follow-up for Phone Call        already has meds on list - ok for refills per robin Follow-up by: Corwin Levins MD,  September 13, 2009 8:48 AM    Prescriptions: ALLOPURINOL 300 MG TABS (ALLOPURINOL) 1 by mouth once daily  #30 x 2   Entered by:   Scharlene Gloss   Authorized by:   Corwin Levins MD   Signed by:   Scharlene Gloss on 09/13/2009   Method used:   [facility] ...       [facility]  [address] [id number]       [address]       [place], [place]  [address]       Ph: [phone] or [phone]       Fax: [phone]   RxID:   [id number] COLCHICINE 0.6 MG TABS (COLCHICINE) Take 1 tablet by mouth once a day as needed  #30 x 2   Entered by:   Scharlene Gloss   Authorized by:   Corwin Levins MD   Signed by:   Scharlene Gloss on 09/13/2009   Method used:   [facility] to ...       [facility]  [address] [id number]       [address]       [place], [place]  [address]       Ph: [phone] or [phone]       Fax: [phone]   RxID:   [id number]   Appended Document: Gout RX needed Patient notified.

## 2010-10-09 NOTE — Progress Notes (Signed)
Summary: REFILLS  Phone Note Call from Patient Call back at Home Phone [phone]   Caller: Patient Summary of Call: REQUESTING REFILLS ON COLCHCICINE 0.6 MG FOR GOUT AND ALLOPURINOL 300 MG.  HE WANTS 2 REFILLS TO LAST UNTIL HIS APPT WITH DR Jonny Ruiz ON March 03, 2010.   H[facility] [address] Initial call taken by: Hilarie Fredrickson,  Jan 31, 2010 10:48 AM  Follow-up for Phone Call        pt has a total of 8 fills of both medications at his pharmacy, verified with pharmacist. I called pt to inform, no answer no VM. Follow-up by: Margaret Pyle, CMA,  Jan 31, 2010 10:56 AM  Additional Follow-up for Phone Call Additional follow up Details #1::        pt informed Additional Follow-up by: Margaret Pyle, CMA,  Jan 31, 2010 2:27 PM

## 2010-10-09 NOTE — Procedures (Signed)
Summary: Instruction for procedure/[facility]  Instruction for procedure/[facility]   Imported By: Sherian Rein 03/31/2010 15:18:36  _____________________________________________________________________  External Attachment:    Type:   Image     Comment:   External Document

## 2010-10-09 NOTE — Letter (Signed)
Summary: Hermelinda Medicus MD ENT  Hermelinda Medicus MD ENT   Imported By: Sherian Rein 02/13/2010 12:19:26  _____________________________________________________________________  External Attachment:    Type:   Image     Comment:   External Document

## 2010-10-09 NOTE — Procedures (Signed)
Summary: Colonoscopy  Patient: [name] Note: All result statuses are Final unless otherwise noted.  Tests: (1) Colonoscopy (COL)   COL Colonoscopy           DONE     [facility]     [address] [place], [place]  [address]           COLONOSCOPY PROCEDURE REPORT           PATIENT:  Gerald Day, Gerald Day  MR#:  [id number]     BIRTHDATE:  August 19, 1956, 54 yrs. old  GENDER:  male     ENDOSCOPIST:  Wilhemina Bonito. Eda Keys, MD     REF. BY:  Surveillance Program Recall,     PROCEDURE DATE:  04/02/2010     PROCEDURE:  Colonoscopy with snare polypectomy X 1     ASA CLASS:  Class II     INDICATIONS:  history of pre-cancerous (adenomatous) colon polyps,     surveillance and high-risk screening ;INDEX 2008 > 10 POLYPS; 2009     5 POLYPS; 2010 2 POLYPS     MEDICATIONS:   Fentanyl 125 mcg IV, Versed 12 mg IV           DESCRIPTION OF PROCEDURE:   After the risks benefits and     alternatives of the procedure were thoroughly explained, informed     consent was obtained.  Digital rectal exam was performed and     revealed no abnormalities.   The Pentax Colonoscope [id number]     endoscope was introduced through the anus and advanced to the     cecum, which was identified by both the appendix and ileocecal     valve, without limitations.TIME TO CECUM = 2 MIN.  The quality of     the prep was excellent, using MoviPrep.  The instrument was then     slowly withdrawn (TIME = 15 MIN)as the colon was fully examined.     <<PROCEDUREIMAGES>>           FINDINGS:  A diminutive polyp was found in the ascending colon.     Polyp was snared without cautery. Retrieval was unsuccessful.     snare polyp  Moderate diverticulosis was found throughout the     colon.  This was otherwise a normal examination of the colon.     Retroflexed views in the rectum revealed hemorrhoids.    The scope     was then withdrawn from the patient and the procedure completed.           COMPLICATIONS:  None     ENDOSCOPIC  IMPRESSION:     1) Diminutive polyp in the ascending colon - REMOVED     2) Moderate diverticulosis throughout the colon     3) Otherwise normal examination     4) Hemorrhoids, Small           RECOMMENDATIONS:     1) Follow up colonoscopy in 2 years           ______________________________     Wilhemina Bonito. Eda Keys, MD           CC:  Corwin Levins, MD;The Patient           n.     Rosalie DoctorWilhemina Bonito. Eda Keys at 04/02/2010 11:14 AM           Lissa Merlin, [phone]  Note: An exclamation mark (!) indicates a result that was not  dispersed into the flowsheet. Document Creation Date: 04/02/2010 11:15 AM _______________________________________________________________________  (1) Order result status: Final Collection or observation date-time: 04/02/2010 10:54 Requested date-time:  Receipt date-time:  Reported date-time:  Referring Physician:   Ordering Physician: Fransico Setters [phone]) Specimen Source:  Source: Launa Grill Order Number: [id number] Lab site:   Appended Document: Colonoscopy recall    Clinical Lists Changes  Observations: Added new observation of COLONNXTDUE: 03/07/2012 (04/08/2010 14:22)

## 2010-11-22 LAB — GLUCOSE, CAPILLARY: Glucose-Capillary: 301 mg/dL — ABNORMAL HIGH (ref 70–99)

## 2010-12-14 LAB — GLUCOSE, CAPILLARY: Glucose-Capillary: 166 mg/dL — ABNORMAL HIGH (ref 70–99)

## 2011-01-23 NOTE — Discharge Summary (Signed)
[facility]  Patient:    Gerald Day, Gerald Day Visit Number: [id number] MRN: [id number]          Service Type: MED Location: [phone] Attending Physician:  Corwin Levins Dictated by:   Claretta Fraise, M.D. Admit Date:  05/18/2001 Discharge Date: 05/23/2001   CC:         Corwin Levins, M.D. [facility]   Discharge Summary  ADDENDUM:  HYPOKALEMIA:  The patients initial potassium earlier today was 2.8.  He was given replacement and that came back up to 3.2.  He was given another 80 mEq and this should get him back into the normal range.  He is to go home on the home potassium, as previously listed on the initial discharge dictation.  In addition, blood cultures that were done on May 18, 2001, have remained negative x 2 sets. Dictated by:   Claretta Fraise, M.D. Attending Physician:  Corwin Levins DD:  05/23/01 TD:  05/23/01 Job: [id number]

## 2011-01-23 NOTE — H&P (Signed)
[facility]  Patient:    Gerald Day, Gerald Day Visit Number: [id number] MRN: [id number]          Service Type: MED Location: [id number] Attending Physician:  Corlis Leak. Dictated by:   Corwin Levins, M.D. [facility] Admit Date:  05/18/2001                           History and Physical  CHIEF COMPLAINT:  Right leg redness and swelling worsening.  HISTORY OF PRESENT ILLNESS:  Gerald Day is a 55 year old white male with cellulitis to the right leg beginning along the pretibial area of the right leg below the knee, rather mild with erythema and mild tenderness. He has been on Levaquin for the last six days. Unfortunately, it has progressed to the point where despite keeping the leg up and being off work, the erythema is worse, pain is worse, and the foot has become remarkably swollen as well. There is no rigors or chills.  He does have a history of previous cellulitis related to severe chronic stasis edema in the past. Also incidentally, he had oral herpes lesion for which he was given Valtrex five days ago, now crusted and nearly healed.  PAST MEDICAL HISTORY: 1. Morbid obesity. 2. Chronic venous stasis edema. 3. Diabetes mellitus, diet controlled, in the past. 4. Hypertension. 5. Gout. 6. Obstructive sleep apnea.  PAST SURGICAL HISTORY: 1. Status post left foot surgery. 2. Status post appendectomy.  ALLERGIES:  PENICILLIN.  CURRENT MEDICATIONS: 1. Testosterone patch. 2. Lasix 80 mg b.i.d. 3. K-Dur 40 mEq p.o. q.d. 4. Celebrex 200 mg p.o. b.i.d. p.r.n. 5. Levaquin, now six days into therapy, p.o. 500 mg per day.  SOCIAL HISTORY:  No tobacco. Alcohol occasional. Married, three children. 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  FAMILY HISTORY:  Negative for diabetes, hypertension, or heart disease.  REVIEW OF SYSTEMS:  Otherwise noncontributory.  PHYSICAL EXAMINATION:  GENERAL:  Gerald Day, a 55 year old, white male,  morbidly obese.  VITAL SIGNS:  Blood pressure 140/80, respirations 20, pulse 72, temperature 98.1, weight 445.  HEENT:  ENT exam: Sclerae clear, TMs clear, pharynx benign.  NECK:  Without lymphadenopathy, JVD, thyromegaly.  CHEST:  No rales, wheezing.  CARDIAC:  Regular rate and rhythm, no murmur.  ABDOMEN:  Soft, nontender, positive bowel sounds. No organomegaly, masses.  EXTREMITIES:  4+ edema with worsening of the right leg below the knee with marked erythema, swelling, tenderness, including the ankle and foot.  ASSESSMENT/PLAN: #1 - RIGHT LEG CELLULITIS:  No help with six days of p.o. Levaquin and I doubt gout as etiology as given the presentation and progression despite p.o. Levaquin. He is to be admitted, given IV antibiotics. Check blood cultures and plain films of the leg and foot as well as pain medication on a p.r.n. basis. Dictated by:   Corwin Levins, M.D. [facility] Attending Physician:  Corlis Leak DD:  05/18/01 TD:  05/18/01 Job: [id number]

## 2011-01-23 NOTE — Discharge Summary (Signed)
[place]  Patient:    JARRETT, ALBOR Visit Number: [id number] MRN: [id number]          Service Type: MED Location: [phone] Attending Physician:  Corwin Levins Dictated by:   Claretta Fraise, M.D. Admit Date:  05/18/2001 Disc. Date: 05/23/01   CC:         Corwin Levins, M.D. [facility]   Discharge Summary  DISCHARGE DIAGNOSES: 1. Right lower extremity cellulitis. 2. Lower extremity chronic venostasis. 3. Hypertension, started on Altace treatment. 4. Diabetes mellitus, primarily diet controlled with a hemoglobin A1C of 6.0    during this hospital admission. 5. Hypokalemia secondary to achieving good diuresis, this is being replaced. 6. History of obstructive sleep apnea, on CPAP. 7. Morbid obesity. 8. History of gout.  DISCHARGE MEDICATIONS: 1. Clindamycin 450 mg p.o. one tablet p.o. q.i.d. x 7 days. 2. Keflex 500 mg two tabs p.o. b.i.d. x 7 days. 3. Altace 5 mg p.o. q.d. 4. Lasix 80 mg p.o. b.i.d. 5. K-Dur 40 mEq p.o. b.i.d. x 2 days, then back to his usual dose of one q.d. 6. The patient is to continue on his testosterone patch and also the Celebrex    200 mg p.o. b.i.d. p.r.n.  ACTIVITY:  The patient is advised not to do any extensive walking this week, but allowed to walk, in fact, encouraged him to do some amount of walking. Gave him work excuse until May 30, 2001.  The remained of excuse will be determined by Dr. Jonny Ruiz at the time of his followup visit.  DIET:  Diabetic diet.  DISCHARGE INSTRUCTIONS:  The patient is to wear his compression stockings once he gets it, and up until then he will wear his TED hoses.  FOLLOWUP:  With Dr. Jonny Ruiz this Thursday.  He will need basic metabolic panel at the time of this visit for two reasons.  To recheck on his potassium, and to recheck on his BUN and creatinine since being on Altace for approximately a week.  HOSPITAL COURSE:  This patient of Dr. Melvyn Novas was admitted by Dr. Jonny Ruiz  through the clinic on May 18, 2001, with right lower extremity cellulitis that was not responding to outpatient treatment with Levaquin.  He had been on Levaquin six days prior to admission.  The patient is subsequently admitted, started on IV Kefzol, and on day #2, clindamycin was also added since there was no significant improvement in his right lower extremity cellulitis.  A major component of his cellulitis is secondary to chronic venostasis with his legs being markedly swollen from his usual.  Doppler of his right lower extremity was obtained and that was negative for deep venous thrombosis, although it was in the past studied because of his body habitus, but clearly there were no obvious deep venous thromboses noted.  X-rays were also obtained of his right lower extremity, and that was negative for any signs of osteomyelitis.  His initial white count was really not elevated, with his initial white count being 10.2 with an hemoglobin of 13.6, 38.9, and platelet count of 313.  The patient has responded well with the above treatment.  We also achieved excellent diuresis with him going from a weight of 445 on the date of admission, to 423.8 at the time of discharge.  The patient is switched to oral antibiotics the day prior to discharge, and he has remained afebrile with continued improvement of his right lower extremity cellulitis.  In regards to his  venostasis, the patient was kept on his Lasix 80 mg b.i.d. For a day or so, he had an additional Lasix dose, and also Zaroxolyn was added to his regimen 1/2 hour prior to his morning Lasix dose, and with that, we have achieved excellent diuresis as previously mentioned.  I have gone ahead and discontinued him off of his Zaroxolyn and he is to stay on his Lasix b.i.d. dosing.  In regards to his hypokalemia, secondary to excellent diuresis, we will go ahead and replace him today.  His potassium today was 2.8.  He will be  getting aggressive potassium replacement with a recheck prior to discharge.  I will dictate an addendum once I get the results of that.  There is no reason for him to stay in the hospital for this.  In regards to his history of hypertension.  During his hospital stay it was noted that his systolic blood pressure had remained greater than 115, in fact a couple of times he had gone up to 187.  Therefore, Altace 5 mg p.o. q.d. was started on him on September 12, and he has done relatively well with it.  On the day of discharge his blood pressure was 108/59, although prior to that he had been running in the 130 to 150 range, and this certainly can be monitored by Dr. Jonny Ruiz, and adjustments can be made.  His creatinine has stayed stable, and on the day of discharge his creatinine is 1.3.  This will need to be rechecked at the time of visit with Dr. Jonny Ruiz.  In regards to his diabetes mellitus, his CBGs were obtained and all of them remained in the less than 160 range, the highest being 151.  His hemoglobin A1C was obtained, and it was excellent at 6.  Diabetes educator was brought in, and it was recommended for him to have outpatient treatment which is what I  was thinking.  The patient will be given the number for setting an appointment with the DM educator, in terms of achieving and maintaining good diabetes control, and also for weight loss regimen, both in terms of his hypertension, sleep apnea, and diabetes, all of which will be helpful.  The patient is being discharged to home in stable condition, and as mentioned earlier and addendum will be dictated once his repleted potassium is done. The patient is to take an extra potassium dose for the next couple of days, with a recheck at the time of followup with Dr. Jonny Ruiz. Dictated by:   Claretta Fraise, M.D. Attending Physician:  Corwin Levins DD:  05/23/01 TD:  05/23/01 Job: [id number]

## 2011-01-23 NOTE — Discharge Summary (Signed)
NAME:  Gerald Day, Gerald Day                        ACCOUNT NO.:  [id number]   MEDICAL RECORD NO.:  [id number]                   PATIENT TYPE:  INP   LOCATION:  5506                                 FACILITY:  [facility]   PHYSICIAN:  Rene Paci, M.D. [facility]          DATE OF BIRTH:  1956/05/17   DATE OF ADMISSION:  05/15/2002  DATE OF DISCHARGE:  05/17/2002                                 DISCHARGE SUMMARY   DISCHARGE DIAGNOSES:  1. Cellulitis.  2. Hypertension.  3. Hyperglycemia.  4. Hypokalemia.   HISTORY OF PRESENT ILLNESS:  The patient is a 55 year old white male who  presented with a two week history of left lower extremity swelling. The  patient was  started on a 10  day course of Levaquin.  He did not have any  improvement after six days, and at that point his antibiotics were changed.  It is not clear what he was  changed to.   PAST MEDICAL HISTORY:  1. History of right lower extremity cellulitis two years ago.  2. Hypertension.  3. Diet controlled diabetes.  4. Morbid obesity.  5. Chronic venous  stasis disease.  6. Gout.  7. Obstructive sleep apnea.   HOSPITAL COURSE:  PROBLEM #1, INFECTIOUS DISEASE:  The patient remained  afebrile.  White count remained normal.  He did have some superficial  cellulitis and was started on IV clindamycin. He received three days of IV  antibiotics and we will change to oral and have him complete 10 days. The  patient has some mild left lower extremity erythema.   PROBLEM #2, HYPOKALEMIA:  The patient is on a large dose of  Lasix. We did  give him oral potassium with correction of his  hypokalemia. We will also  discharge the patient on a small dose of potassium.   PROBLEM #3, HYPERGLYCEMIA:  Poorly, the patient has a history of  diet  controlled diabetes, although the patient is not  aware of his diagnosis and  lacks insight into this process. The patient probably needs followup at the  nutrition management center regarding his diabetes  and may need to be  started on an oral  agent, but we will defer this to his primary physician.   PROBLEM #4, HYPERTENSION:  The patient states that in the past he was on  Altace, but  this is discontinued and he is not clear why. The patient's  systolic blood pressures have consistently been in the 150s. Again, it may  be worthwhile to resume his Altace .   LABORATORY DATA:  Labs at discharge, the BMET was normal.   DISCHARGE MEDICATIONS:  1. Clindamycin 600 mg t.i.d. x 7 days.  2. Potassium chloride 10 mEq q.d.  3. Lasix 80 mg b.i.d. as at home.   FOLLOW UP:  The patient was  to follow up with Dr. Jonny Ruiz on May 24, 2002, at 8 a.m. for followup of his cellulitis, hypertension  and diabetes.      Cornell Barman, P.A. [facility]                  Rene Paci, M.D. [facility]    LC/MEDQ  D:  05/17/2002  T:  05/18/2002  Job:  [id number]   cc:   Corwin Levins, M.D. [facility]

## 2011-01-23 NOTE — H&P (Signed)
NAME:  Gerald Day, SKOOG NO.:  [id number]   MEDICAL RECORD NO.:  [id number]            PATIENT TYPE:   LOCATION:                                 FACILITY:   PHYSICIAN:  Corwin Levins, M.D. [facility]     DATE OF BIRTH:   DATE OF ADMISSION:  12/16/2004  DATE OF DISCHARGE:                                HISTORY & PHYSICAL   CHIEF COMPLAINT:  Here with worsening left lower extremity cellulitis.   HISTORY OF PRESENT ILLNESS:  Mr. Shives is a 55 year old white male with a  history of chronic peripheral edema who presents for the above. He was seen  April 7 and treated with p.o. Avelox for left lower extremity redness,  swelling, and tenderness consistent with cellulitis. White blood cell count  post visit was 17,000. He presents today unfortunately with worsening  swelling, redness and pain especially much more difficult to ambulate. He  has had no high fevers or chills. He has had previous admissions for  cellulitis. He has never had MRSA or other problems known. He has chronic  lower extremity swelling, no history of DVT. Due to worsening situation, he  will need to be admitted for IV antibiotics.   PAST MEDICAL HISTORY:  1.  Morbid obesity.  2.  Chronic peripheral edema.  3.  Hypertension.  4.  Obstructive sleep apnea.  5.  Gout.  6.  Glucose intolerance/diabetes diet controlled.  7.  Erectile dysfunction.   PAST SURGICAL HISTORY:  1.  Status post left foot surgery.  2.  Status post appendectomy.   ALLERGIES:  PENICILLIN.   CURRENT MEDICATIONS:  1.  Lasix 80 mg b.i.d.  2.  Potassium supplement 10 mEq p.o. q.d.  3.  Allopurinol 300 mg p.o. q.d.   SOCIAL HISTORY:  No tobacco, no alcohol.   FAMILY HISTORY:  Noncontributory.   REVIEW OF SYMPTOMS:  Otherwise noncontributory.   PHYSICAL EXAMINATION:  GENERAL:  Mr. Counsell is a 55 year old white male.  VITAL SIGNS:  Blood pressure 140/70, respirations 20, pulse 64, temperature  97.2, weight less than 350.  ENT:   Sclera clear, TM's clear, pharynx benign.  NECK:  Without lymphadenopathy, JVD, thyromegaly.  CHEST:  No rales or wheezing.  CARDIAC:  Regular rate and rhythm. No murmur.  ABDOMEN:  Soft, nontender, positive bowel sounds. No organomegaly or masses.  EXTREMITIES:  No edema.  NEUROLOGIC:  Cranial nerves II-XII intact otherwise nonfocal.  SKIN:  No significant lesions except for left lower extremity below the knee  with worsening _________ swelling, redness and tenderness with swelling now  involving the dorsum of the foot as well.   ASSESSMENT:  1.  Cellulitis left lower extremity worsening now severe not responding to      p.o. Avelox for the last four days. He will need to be admitted, check      blood cultures, routine labs including a white blood cell count. He will      need IV vancomycin and gentamycin, leg elevation, check lower extremity      venous Doppler, rule out DVT and Vicodin p.r.n.  2.  Other medical problems. Continue home medications as is.       ___________________________________________  Corwin Levins, M.D. [facility]    JWJ/MEDQ  D:  12/16/2004  T:  12/16/2004  Job:  [id number]

## 2011-01-23 NOTE — Discharge Summary (Signed)
Gerald Day, Gerald Day              ACCOUNT NO.:  [id number]   MEDICAL RECORD NO.:  [id number]          PATIENT TYPE:  INP   LOCATION:  0444                         FACILITY:  [facility]   PHYSICIAN:  Sean A. Everardo All, M.D. [facility] OF BIRTH:  Jan 17, 1956   DATE OF ADMISSION:  12/16/2004  DATE OF DISCHARGE:  12/22/2004                                 DISCHARGE SUMMARY   REASON FOR ADMISSION:  Cellulitis.   HISTORY OF THE PRESENT ILLNESS:  The patient is a 55 year old man admitted  by Dr. Jonny Ruiz on December 16, 2004 with left lower extremity cellulitis after a  failure of oral therapy as an outpatient.  Please refer to his dictated  history and physical for details.   HOSPITAL COURSE:  The patient was admitted, and Doppler ultrasound of the  left leg did not show a DVT.  He was treated with intravenous vancomycin  because of his allergy to penicillin.  On this, he steadily improved  throughout his hospitalization.  Still, he continued to have significant  erythema at the leg.   Regarding his diabetes, previously on no medications, he was started on  Glucophage and Amaryl as well as insulin.  To facilitate his discharge, his  insulin is being discontinued, and his Glucophage is being increased at the  time of discharge.   By December 22, 2004, the patient was alert, oriented, eating his usual diet,  and ambulatory when necessary.  Thus, plan is to place PICC line today, then  continue vancomycin per home health pharmacy, for a total of two weeks  intravenous therapy.   DIAGNOSES AT THE TIME OF DISCHARGE:  1.  Left lower leg cellulitis due to #2 and 3.  2.  Chronic venous insufficiency of the left leg.  3.  Type 2 diabetes.  4.  Slightly elevated hepatic transaminases noted during his admission.  5.  Other chronic medical problems as noted in Dr. Raphael Gibney history and      physical.   MEDICATIONS AT DISCHARGE:  1.  Amaryl 2 mg daily.  2.  Lasix 80 mg daily.  3.  Potassium 10 mEq daily.  4.   Allopurinol 300 mg daily.  5.  Glucophage-XR 1,000 mg b.i.d.  6.  Vancocin per home health pharmacy, to complete two weeks total IV      therapy.  7.  Tylenol as needed for pain.   DIET:  Weight loss as advised.   FOLLOWUP:  Dr. Jonny Ruiz, next week.   ACTIVITY:  Keep left leg elevated.   SPECIAL INSTRUCTIONS:  He was given a prescription for a glucose meter and  strips, and he is advised to check his glucose daily, varying the time of  day among a.c. and at bedtime, and to bring the record to his next upcoming  appointment with Dr. Jonny Ruiz.      SAE/MEDQ  D:  12/22/2004  T:  12/22/2004  Job:  [id number]   cc:   Corwin Levins, M.D. [facility]

## 2011-06-05 LAB — GLUCOSE, CAPILLARY

## 2011-07-05 ENCOUNTER — Encounter: Payer: Self-pay | Admitting: Internal Medicine

## 2011-07-05 DIAGNOSIS — Z0000 Encounter for general adult medical examination without abnormal findings: Secondary | ICD-10-CM | POA: Insufficient documentation

## 2011-07-06 ENCOUNTER — Ambulatory Visit (INDEPENDENT_AMBULATORY_CARE_PROVIDER_SITE_OTHER): Payer: BC Managed Care – PPO | Admitting: Internal Medicine

## 2011-07-06 ENCOUNTER — Other Ambulatory Visit (INDEPENDENT_AMBULATORY_CARE_PROVIDER_SITE_OTHER): Payer: BC Managed Care – PPO

## 2011-07-06 ENCOUNTER — Other Ambulatory Visit: Payer: Self-pay | Admitting: Internal Medicine

## 2011-07-06 ENCOUNTER — Encounter: Payer: Self-pay | Admitting: Internal Medicine

## 2011-07-06 DIAGNOSIS — E119 Type 2 diabetes mellitus without complications: Secondary | ICD-10-CM

## 2011-07-06 DIAGNOSIS — I10 Essential (primary) hypertension: Secondary | ICD-10-CM

## 2011-07-06 DIAGNOSIS — E785 Hyperlipidemia, unspecified: Secondary | ICD-10-CM

## 2011-07-06 DIAGNOSIS — Z0000 Encounter for general adult medical examination without abnormal findings: Secondary | ICD-10-CM

## 2011-07-06 MED ORDER — METFORMIN HCL ER 500 MG PO TB24: 1000.0000 mg | ORAL_TABLET | Freq: Every day | ORAL | Status: DC

## 2011-07-06 MED ORDER — POTASSIUM CHLORIDE CRYS ER 20 MEQ PO TBCR: 40.0000 meq | EXTENDED_RELEASE_TABLET | Freq: Two times a day (BID) | ORAL | Status: DC

## 2011-07-06 MED ORDER — AMLODIPINE-OLMESARTAN 5-40 MG PO TABS: 1.0000 | ORAL_TABLET | Freq: Every day | ORAL | Status: DC

## 2011-07-06 MED ORDER — ALLOPURINOL 300 MG PO TABS: 300.0000 mg | ORAL_TABLET | Freq: Every day | ORAL | Status: DC

## 2011-07-06 MED ORDER — FUROSEMIDE 80 MG PO TABS: 80.0000 mg | ORAL_TABLET | Freq: Two times a day (BID) | ORAL | Status: DC

## 2011-07-06 NOTE — Assessment & Plan Note (Signed)
stable overall by hx and exam, most recent data reviewed with pt, and pt to continue medical treatment as before  BP Readings from Last 3 Encounters:  07/06/11 132/90  03/03/10 154/100  09/05/09 160/92

## 2011-07-06 NOTE — Patient Instructions (Addendum)
Continue all other medications as before Please go to LAB in the Basement for the blood and/or urine tests to be done today Please call the phone number [phone] (the [employer] System) for results of testing in 2-3 days;  When calling, simply dial the number, and when prompted enter the MRN number above (the Medical Record Number) and the # key, then the message should start. Please return in 3 mo with Lab testing done 3-5 days before (the office will call)

## 2011-07-06 NOTE — Assessment & Plan Note (Signed)
stable overall by hx and exam, most recent data reviewed with pt, and pt to continue medical treatment as before  Lab Results  Component Value Date   LDLCALC 108* 09/02/2009   Goal ldl < 70, may need to add lipitor

## 2011-07-06 NOTE — Assessment & Plan Note (Signed)
stable overall by hx and exam, most recent data reviewed with pt, and pt to continue medical treatment as before, but I am concerned he is not taking all meds, and a1c last was > 8; suspect he will need actos generic as well

## 2011-07-06 NOTE — Progress Notes (Signed)
  Subjective:    Patient ID: Gerald Day, male    DOB: Sep 12, 1955, 55 y.o.   MRN: [id number]  HPI  Here to f/u; overall doing ok,  Pt denies chest pain, increased sob or doe, wheezing, orthopnea, PND, increased LE swelling, palpitations, dizziness or syncope.  Pt denies new neurological symptoms such as new headache, or facial or extremity weakness or numbness   Pt denies polydipsia, polyuria, or low sugar symptoms such as weakness or confusion improved with po intake.  Pt states overall good compliance with meds, trying to follow lower cholesterol, diabetic diet, wt overall stable but little exercise however.  Has been concerned about how many meds he takes, and has not been taking the amaryl, pravachol, and low on other meds.   Past Medical History  Diagnosis Date  . COLONIC POLYPS, HX OF 09/05/2009  . LOW BACK PAIN 05/21/2007  . CELLULITIS/ABSCESS, LEG 06/27/2007  . ERECTILE DYSFUNCTION, ORGANIC 04/24/2009  . DIVERTICULOSIS, COLON 03/14/2008  . SINUSITIS- ACUTE-NOS 08/28/2008  . CONGESTIVE HEART FAILURE 05/21/2007  . HYPERTENSION 05/21/2007  . SLEEP APNEA, OBSTRUCTIVE 05/21/2007  . ERECTILE DYSFUNCTION 05/21/2007  . Morbid obesity 05/21/2007  . GOUT NOS 05/21/2007  . HYPERLIPIDEMIA 06/27/2007  . DIABETES MELLITUS, TYPE II 05/21/2007  . COLONIC POLYPS 03/14/2008   Past Surgical History  Procedure Date  . Appendectomy   . Foot surgery     LT    reports that he has never smoked. He does not have any smokeless tobacco history on file. He reports that he drinks alcohol. His drug history not on file. family history includes Asthma in his other and Diabetes in his father. Allergies  Allergen Reactions  . Penicillins    Current Outpatient Prescriptions on File Prior to Visit  Medication Sig Dispense Refill  . aspirin EC 81 MG tablet Take 81 mg by mouth daily.        Marland Kitchen glucose blood (ONE TOUCH ULTRA TEST) test strip 1 each by Other route as needed. Use as instructed       . Lancets MISC by  Does not apply route.         Review of Systems Review of Systems  Constitutional: Negative for diaphoresis and unexpected weight change.  HENT: Negative for drooling and tinnitus.   Eyes: Negative for photophobia and visual disturbance.  Respiratory: Negative for choking and stridor.   Gastrointestinal: Negative for vomiting and blood in stool.  Genitourinary: Negative for hematuria and decreased urine volume.    Objective:   Physical Exam BP 132/90  Pulse 64  Temp(Src) 98 F (36.7 C) (Oral)  Ht 6' (1.829 m)  Wt 410 lb 8 oz (186.202 kg)  BMI 55.67 kg/m2  SpO2 97% Physical Exam  VS noted, morbid obese Constitutional: Pt appears well-developed and well-nourished.  HENT: Head: Normocephalic.  Right Ear: External ear normal.  Left Ear: External ear normal.  Eyes: Conjunctivae and EOM are normal. Pupils are equal, round, and reactive to light.  Neck: Normal range of motion. Neck supple.  Cardiovascular: Normal rate and regular rhythm.   Pulmonary/Chest: Effort normal and breath sounds normal.  Neurological: Pt is alert. No cranial nerve deficit.  Skin: Skin is warm. No erythema.  Psychiatric: Pt behavior is normal. Thought content normal.     Assessment & Plan:

## 2011-07-07 LAB — BASIC METABOLIC PANEL
GFR: 100.26 mL/min (ref >60.00)
Glucose, Bld: 229 mg/dL — ABNORMAL HIGH (ref 70–99)
Potassium: 4.4 mEq/L (ref 3.5–5.1)
Sodium: 138 mEq/L (ref 135–145)

## 2011-07-07 LAB — LIPID PANEL: HDL: 47.9 mg/dL (ref >39.00)

## 2011-07-08 ENCOUNTER — Other Ambulatory Visit: Payer: Self-pay | Admitting: Internal Medicine

## 2011-07-08 MED ORDER — METFORMIN HCL ER 500 MG PO TB24: ORAL_TABLET | ORAL | Status: DC

## 2011-07-08 MED ORDER — ATORVASTATIN CALCIUM 10 MG PO TABS: 10.0000 mg | ORAL_TABLET | Freq: Every day | ORAL | Status: DC

## 2012-03-17 ENCOUNTER — Ambulatory Visit (INDEPENDENT_AMBULATORY_CARE_PROVIDER_SITE_OTHER): Payer: BC Managed Care – PPO | Admitting: Internal Medicine

## 2012-03-17 ENCOUNTER — Encounter: Payer: Self-pay | Admitting: Internal Medicine

## 2012-03-17 VITALS — BP 138/100 | HR 79 | Temp 97.2°F | Ht 72.0 in | Wt >= 6400 oz

## 2012-03-17 DIAGNOSIS — I10 Essential (primary) hypertension: Secondary | ICD-10-CM

## 2012-03-17 DIAGNOSIS — Z0000 Encounter for general adult medical examination without abnormal findings: Secondary | ICD-10-CM

## 2012-03-17 DIAGNOSIS — E785 Hyperlipidemia, unspecified: Secondary | ICD-10-CM

## 2012-03-17 DIAGNOSIS — F528 Other sexual dysfunction not due to a substance or known physiological condition: Secondary | ICD-10-CM

## 2012-03-17 DIAGNOSIS — E119 Type 2 diabetes mellitus without complications: Secondary | ICD-10-CM

## 2012-03-17 MED ORDER — METFORMIN HCL ER 500 MG PO TB24: ORAL_TABLET | ORAL | Status: DC

## 2012-03-17 MED ORDER — FUROSEMIDE 80 MG PO TABS: 80.0000 mg | ORAL_TABLET | Freq: Two times a day (BID) | ORAL | Status: DC

## 2012-03-17 MED ORDER — ALLOPURINOL 300 MG PO TABS: 300.0000 mg | ORAL_TABLET | Freq: Every day | ORAL | Status: DC

## 2012-03-17 MED ORDER — TADALAFIL 20 MG PO TABS: 20.0000 mg | ORAL_TABLET | Freq: Every day | ORAL | Status: DC | PRN

## 2012-03-17 MED ORDER — POTASSIUM CHLORIDE CRYS ER 20 MEQ PO TBCR: 40.0000 meq | EXTENDED_RELEASE_TABLET | Freq: Two times a day (BID) | ORAL | Status: DC

## 2012-03-17 MED ORDER — AMLODIPINE-OLMESARTAN 5-40 MG PO TABS: 1.0000 | ORAL_TABLET | Freq: Every day | ORAL | Status: DC

## 2012-03-17 MED ORDER — ATORVASTATIN CALCIUM 10 MG PO TABS: 10.0000 mg | ORAL_TABLET | Freq: Every day | ORAL | Status: DC

## 2012-03-17 NOTE — Patient Instructions (Addendum)
Take all new medications as prescribed - the cialis (given hardcopy) Continue all other medications as before - your medications were all refilled today No blood work needed today Please return in 2 mo with Lab testing done 3-5 days before

## 2012-03-19 ENCOUNTER — Encounter: Payer: Self-pay | Admitting: Internal Medicine

## 2012-03-19 NOTE — Assessment & Plan Note (Signed)
stable overall by hx and exam, most recent data reviewed with pt, and pt to continue medical treatment as before BP Readings from Last 3 Encounters:  03/17/12 138/100  07/06/11 132/90  03/03/10 154/100

## 2012-03-19 NOTE — Assessment & Plan Note (Signed)
stable overall by hx and exam, to re-start meds, most recent data reviewed with pt, and pt to continue medical treatment as before Lab Results  Component Value Date   HGBA1C 10.0* 07/06/2011

## 2012-03-19 NOTE — Progress Notes (Signed)
Subjective:    Patient ID: Gerald Day, male    DOB: [birth year], 56 y.o.   MRN: [id number]  HPI  Here to f/u; overall doing ok,  Pt denies chest pain, increased sob or doe, wheezing, orthopnea, PND, increased LE swelling, palpitations, dizziness or syncope.  Pt denies new neurological symptoms such as new headache, or facial or extremity weakness or numbness   Pt denies polydipsia, polyuria, or low sugar symptoms such as weakness or confusion improved with po intake.  Pt states overall good compliance with meds, trying to follow lower cholesterol, diabetic diet, wt overall stable but little exercise however.  No acute complaints.  Unfortunately  Out of meds for 1 mo.  Has worsening ED symptoms in the past 6 mo.  Pt denies fever, wt loss, night sweats, loss of appetite, or other constitutional symptoms   Past Medical History  Diagnosis Date  . COLONIC POLYPS, HX OF 09/05/2009  . LOW BACK PAIN 05/21/2007  . CELLULITIS/ABSCESS, LEG 06/27/2007  . ERECTILE DYSFUNCTION, ORGANIC 04/24/2009  . DIVERTICULOSIS, COLON 03/14/2008  . SINUSITIS- ACUTE-NOS 08/28/2008  . CONGESTIVE HEART FAILURE 05/21/2007  . HYPERTENSION 05/21/2007  . SLEEP APNEA, OBSTRUCTIVE 05/21/2007  . ERECTILE DYSFUNCTION 05/21/2007  . Morbid obesity 05/21/2007  . GOUT NOS 05/21/2007  . HYPERLIPIDEMIA 06/27/2007  . DIABETES MELLITUS, TYPE II 05/21/2007  . COLONIC POLYPS 03/14/2008   Past Surgical History  Procedure Date  . Appendectomy   . Foot surgery     LT    reports that he has never smoked. He does not have any smokeless tobacco history on file. He reports that he drinks alcohol. His drug history not on file. family history includes Asthma in his other and Diabetes in his father. Allergies  Allergen Reactions  . Penicillins    Current Outpatient Prescriptions on File Prior to Visit  Medication Sig Dispense Refill  . allopurinol (ZYLOPRIM) 300 MG tablet Take 1 tablet (300 mg total) by mouth daily.  90 tablet  3  . aspirin EC  81 MG tablet Take 81 mg by mouth daily.        . furosemide (LASIX) 80 MG tablet Take 1 tablet (80 mg total) by mouth 2 (two) times daily.  180 tablet  3  . metFORMIN (GLUCOPHAGE-XR) 500 MG 24 hr tablet 4 tabs by mouth per day  360 tablet  3  . potassium chloride SA (K-DUR,KLOR-CON) 20 MEQ tablet Take 2 tablets (40 mEq total) by mouth 2 (two) times daily.  360 tablet  3  . amLODipine-olmesartan (AZOR) 5-40 MG per tablet Take 1 tablet by mouth daily.  90 tablet  3  . atorvastatin (LIPITOR) 10 MG tablet Take 1 tablet (10 mg total) by mouth daily.  90 tablet  3  . glucose blood (ONE TOUCH ULTRA TEST) test strip 1 each by Other route as needed. Use as instructed       . Lancets MISC by Does not apply route.        . tadalafil (CIALIS) 20 MG tablet Take 1 tablet (20 mg total) by mouth daily as needed for erectile dysfunction.  5 tablet  11   Review of Systems Review of Systems  Constitutional: Negative for diaphoresis and unexpected weight change.  Eyes: Negative for photophobia and visual disturbance.  Respiratory: Negative for choking and stridor.   Gastrointestinal: Negative for vomiting and blood in stool.  Genitourinary: Negative for hematuria and decreased urine volume.  Musculoskeletal: Negative for gait problem.  Skin: Negative for color  change and wound.  Neurological: Negative for tremors and numbness.    Objective:   Physical Exam BP 138/100  Pulse 79  Temp 97.2 F (36.2 C) (Oral)  Ht 6' (1.829 m)  Wt 402 lb 6 oz (182.516 kg)  BMI 54.57 kg/m2  SpO2 97% Physical Exam  VS noted Constitutional: Pt appears well-developed and well-nourished.  HENT: Head: Normocephalic.  Right Ear: External ear normal.  Left Ear: External ear normal.  Eyes: Conjunctivae and EOM are normal. Pupils are equal, round, and reactive to light.  Neck: Normal range of motion. Neck supple.  Cardiovascular: Normal rate and regular rhythm.   Pulmonary/Chest: Effort normal and breath sounds normal.    Neurological: Pt is alert. Not confused Skin: Skin is warm. No erythema.  Psychiatric: Pt behavior is normal. Thought content normal.     Assessment & Plan:

## 2012-03-19 NOTE — Assessment & Plan Note (Signed)
stable overall by hx and exam, most recent data reviewed with pt, and pt to continue medical treatment as before Lab Results  Component Value Date   LDLCALC 108* 09/02/2009

## 2012-03-19 NOTE — Assessment & Plan Note (Signed)
Ok for cialis prn,  to f/u any worsening symptoms or concerns  

## 2012-03-24 ENCOUNTER — Encounter: Payer: Self-pay | Admitting: *Deleted

## 2012-05-12 ENCOUNTER — Telehealth: Payer: Self-pay

## 2012-05-12 NOTE — Telephone Encounter (Signed)
Spoke with patient's wife who said he is having cataract surgery tomorrow.  She said she would have him call me after the surgery to discuss scheduling his hospital colon

## 2012-05-19 ENCOUNTER — Encounter: Payer: BC Managed Care – PPO | Admitting: Internal Medicine

## 2012-07-29 ENCOUNTER — Encounter: Payer: Self-pay | Admitting: Internal Medicine

## 2012-09-08 ENCOUNTER — Telehealth: Payer: Self-pay

## 2012-09-09 NOTE — Telephone Encounter (Signed)
error 

## 2012-10-17 ENCOUNTER — Telehealth: Payer: Self-pay

## 2012-10-17 NOTE — Telephone Encounter (Signed)
Called to schedule hospital colon; no answer

## 2012-11-02 ENCOUNTER — Telehealth: Payer: Self-pay

## 2012-11-02 NOTE — Telephone Encounter (Signed)
Called to schedule colonoscopy - no answer at home or on mobile

## 2012-11-08 ENCOUNTER — Encounter: Payer: Self-pay | Admitting: Internal Medicine

## 2013-04-08 ENCOUNTER — Other Ambulatory Visit: Payer: Self-pay | Admitting: Internal Medicine

## 2013-05-06 ENCOUNTER — Other Ambulatory Visit: Payer: Self-pay | Admitting: Internal Medicine

## 2013-05-26 ENCOUNTER — Other Ambulatory Visit: Payer: Self-pay | Admitting: Internal Medicine

## 2013-07-07 ENCOUNTER — Other Ambulatory Visit: Payer: Self-pay | Admitting: Internal Medicine

## 2013-08-04 ENCOUNTER — Other Ambulatory Visit: Payer: Self-pay | Admitting: Internal Medicine

## 2013-09-19 ENCOUNTER — Other Ambulatory Visit: Payer: Self-pay | Admitting: Internal Medicine

## 2013-09-22 ENCOUNTER — Other Ambulatory Visit: Payer: Self-pay | Admitting: Internal Medicine

## 2013-10-17 ENCOUNTER — Telehealth: Payer: Self-pay

## 2013-10-17 DIAGNOSIS — Z0000 Encounter for general adult medical examination without abnormal findings: Secondary | ICD-10-CM

## 2013-10-17 NOTE — Telephone Encounter (Signed)
Labs entered.

## 2013-10-23 ENCOUNTER — Ambulatory Visit (INDEPENDENT_AMBULATORY_CARE_PROVIDER_SITE_OTHER): Payer: BC Managed Care – PPO | Admitting: Internal Medicine

## 2013-10-23 ENCOUNTER — Encounter: Payer: Self-pay | Admitting: *Deleted

## 2013-10-23 ENCOUNTER — Other Ambulatory Visit (INDEPENDENT_AMBULATORY_CARE_PROVIDER_SITE_OTHER): Payer: BC Managed Care – PPO

## 2013-10-23 ENCOUNTER — Encounter: Payer: Self-pay | Admitting: Internal Medicine

## 2013-10-23 VITALS — BP 130/92 | HR 88 | Temp 97.8°F | Wt >= 6400 oz

## 2013-10-23 DIAGNOSIS — E1169 Type 2 diabetes mellitus with other specified complication: Secondary | ICD-10-CM

## 2013-10-23 DIAGNOSIS — E119 Type 2 diabetes mellitus without complications: Secondary | ICD-10-CM

## 2013-10-23 DIAGNOSIS — L03119 Cellulitis of unspecified part of limb: Principal | ICD-10-CM

## 2013-10-23 DIAGNOSIS — E11621 Type 2 diabetes mellitus with foot ulcer: Secondary | ICD-10-CM | POA: Insufficient documentation

## 2013-10-23 DIAGNOSIS — E1149 Type 2 diabetes mellitus with other diabetic neurological complication: Secondary | ICD-10-CM

## 2013-10-23 DIAGNOSIS — L97509 Non-pressure chronic ulcer of other part of unspecified foot with unspecified severity: Secondary | ICD-10-CM

## 2013-10-23 DIAGNOSIS — L02419 Cutaneous abscess of limb, unspecified: Secondary | ICD-10-CM

## 2013-10-23 LAB — CBC WITH DIFFERENTIAL/PLATELET
BASOS ABS: 0 10*3/uL (ref 0.0–0.1)
Basophils Relative: 0.1 % (ref 0.0–3.0)
EOS ABS: 0 10*3/uL (ref 0.0–0.7)
Eosinophils Relative: 0.2 % (ref 0.0–5.0)
HEMATOCRIT: 40.8 % (ref 39.0–52.0)
Hemoglobin: 13.5 g/dL (ref 13.0–17.0)
LYMPHS ABS: 0.6 10*3/uL — AB (ref 0.7–4.0)
Lymphocytes Relative: 6.3 % — ABNORMAL LOW (ref 12.0–46.0)
MCHC: 33.2 g/dL (ref 30.0–36.0)
MCV: 91.1 fl (ref 78.0–100.0)
Monocytes Absolute: 0.9 10*3/uL (ref 0.1–1.0)
Monocytes Relative: 9.2 % (ref 3.0–12.0)
Neutro Abs: 8.5 10*3/uL — ABNORMAL HIGH (ref 1.4–7.7)
Neutrophils Relative %: 84.2 % — ABNORMAL HIGH (ref 43.0–77.0)
Platelets: 152 10*3/uL (ref 150.0–400.0)
RBC: 4.47 Mil/uL (ref 4.22–5.81)
RDW: 13.4 % (ref 11.5–14.6)
WBC: 10.1 10*3/uL (ref 4.5–10.5)

## 2013-10-23 LAB — BASIC METABOLIC PANEL
BUN: 12 mg/dL (ref 6–23)
CHLORIDE: 97 meq/L (ref 96–112)
CO2: 27 meq/L (ref 19–32)
Calcium: 8.7 mg/dL (ref 8.4–10.5)
Creatinine, Ser: 0.9 mg/dL (ref 0.4–1.5)
GFR: 91.85 mL/min (ref >60.00)
GLUCOSE: 319 mg/dL — AB (ref 70–99)
POTASSIUM: 4.3 meq/L (ref 3.5–5.1)
SODIUM: 132 meq/L — AB (ref 135–145)

## 2013-10-23 LAB — HEMOGLOBIN A1C: Hgb A1c MFr Bld: 9.8 % — ABNORMAL HIGH (ref 4.6–6.5)

## 2013-10-23 MED ORDER — SULFAMETHOXAZOLE-TRIMETHOPRIM 800-160 MG PO TABS: 1.0000 | ORAL_TABLET | Freq: Two times a day (BID) | ORAL | Status: DC

## 2013-10-23 NOTE — Patient Instructions (Addendum)
It was good to see you today.  We have reviewed your prior records including labs and tests today  Test(s) ordered today. Your results will be released to Gerald Day (or called to you) after review, usually within 72hours after test completion. If any changes need to be made, you will be notified at that same time.  Septra antibiotics 2x/day for infection - no changes recommended at this time. Your prescription(s) have been submitted to your pharmacy. Please take as directed and contact our office if you believe you are having problem(s) with the medication(s).  Please schedule followup in 3-4 days for recheck, call sooner if problems.   Cellulitis Cellulitis is an infection of the skin and the tissue beneath it. The infected area is usually red and tender. Cellulitis occurs most often in the arms and lower legs.  CAUSES  Cellulitis is caused by bacteria that enter the skin through cracks or cuts in the skin. The most common types of bacteria that cause cellulitis are Staphylococcus and Streptococcus. SYMPTOMS   Redness and warmth.  Swelling.  Tenderness or pain.  Fever. DIAGNOSIS  Your caregiver can usually determine what is wrong based on a physical exam. Blood tests may also be done. TREATMENT  Treatment usually involves taking an antibiotic medicine. HOME CARE INSTRUCTIONS   Take your antibiotics as directed. Finish them even if you start to feel better.  Keep the infected arm or leg elevated to reduce swelling.  Apply a warm cloth to the affected area up to 4 times per day to relieve pain.  Only take over-the-counter or prescription medicines for pain, discomfort, or fever as directed by your caregiver.  Keep all follow-up appointments as directed by your caregiver. SEEK MEDICAL CARE IF:   You notice red streaks coming from the infected area.  Your red area gets larger or turns dark in color.  Your bone or joint underneath the infected area becomes painful after the  skin has healed.  Your infection returns in the same area or another area.  You notice a swollen bump in the infected area.  You develop new symptoms. SEEK IMMEDIATE MEDICAL CARE IF:   You have a fever.  You feel very sleepy.  You develop vomiting or diarrhea.  You have a general ill feeling (malaise) with muscle aches and pains. MAKE SURE YOU:   Understand these instructions.  Will watch your condition.  Will get help right away if you are not doing well or get worse. Document Released: 06/03/2005 Document Revised: 02/23/2012 Document Reviewed: 11/09/2011 [facility] Patient Information 2014 [name].

## 2013-10-23 NOTE — Progress Notes (Signed)
Subjective:    Patient ID: Gerald Day, male    DOB: 20-Jan-1956, 58 y.o.   MRN: [id number]  Leg Pain     Patient seen today with complaints of right leg swelling and redness.  He stated that it began yesterday.  Erythema up to knee.  Right foot without pain, numbness - redness sparing R foot.  No fever or chills. States he hasn't had an appetite since yesterday.  He has had cellulitis about 10 years ago that was treated with IV antibiotics.    DM - pt reports compliance with Metformin. Does not check blood sugars at home.  Last HgbA1C 2012 was 10.  Past Medical History  Diagnosis Date  . COLONIC POLYPS, HX OF 09/05/2009  . LOW BACK PAIN 05/21/2007  . CELLULITIS/ABSCESS, LEG 06/27/2007  . ERECTILE DYSFUNCTION, ORGANIC 04/24/2009  . DIVERTICULOSIS, COLON 03/14/2008  . SINUSITIS- ACUTE-NOS 08/28/2008  . CONGESTIVE HEART FAILURE 05/21/2007  . HYPERTENSION 05/21/2007  . SLEEP APNEA, OBSTRUCTIVE 05/21/2007  . ERECTILE DYSFUNCTION 05/21/2007  . Morbid obesity 05/21/2007  . GOUT NOS 05/21/2007  . HYPERLIPIDEMIA 06/27/2007  . DIABETES MELLITUS, TYPE II 05/21/2007  . COLONIC POLYPS 03/14/2008     Review of Systems  Constitutional: Positive for appetite change (decreased appetite since 2/15). Negative for fever and chills.  Respiratory: Negative for cough, chest tightness and shortness of breath.   Cardiovascular: Positive for leg swelling (right lower extremity). Negative for chest pain and palpitations.  Endocrine: Negative for polydipsia, polyphagia and polyuria.  Skin: Positive for color change (redness to right lower extremity).  Hematological: Negative for adenopathy.       Objective:   Physical Exam  Constitutional: He is oriented to person, place, and time. He appears well-nourished.  Obese   Pulmonary/Chest: Effort normal.  Musculoskeletal: Normal range of motion. He exhibits no tenderness.  Neurological: He is alert and oriented to person, place, and time.  Skin: Skin is  warm and dry.  Right lower extremity with erythema, edema, and warmth.  Right great toe ulcer.   Psychiatric: He has a normal mood and affect. His behavior is normal. Judgment and thought content normal.    Wt Readings from Last 3 Encounters:  10/23/13 406 lb 12.8 oz (184.523 kg)  03/17/12 402 lb 6 oz (182.516 kg)  07/06/11 410 lb 8 oz (186.202 kg)   BP Readings from Last 3 Encounters:  10/23/13 130/92  03/17/12 138/100  07/06/11 132/90   Lab Results  Component Value Date   WBC 10.3 09/02/2009   HGB 14.4 09/02/2009   HCT 42.8 09/02/2009   PLT 167.0 09/02/2009   GLUCOSE 229* 07/06/2011   CHOL 187 07/06/2011   TRIG 274.0* 07/06/2011   HDL 47.90 07/06/2011   LDLDIRECT 115.0 07/06/2011   LDLCALC 108* 09/02/2009   ALT 39 02/27/2010   AST 21 02/27/2010   NA 138 07/06/2011   K 4.4 07/06/2011   CL 102 07/06/2011   CREATININE 0.8 07/06/2011   BUN 11 07/06/2011   CO2 28 07/06/2011   TSH 3.33 09/02/2009   PSA 0.26 09/02/2009   HGBA1C 10.0* 07/06/2011   MICROALBUR 4.9* 09/02/2009       Assessment & Plan:   Right lower extremity cellulitis - no fevers or chills, patient has allergy to penicillin.  Will RX with Septra DS twice daily for 10 days.  Pt will return to clinic in 72 hours for recheck.  Advised to call back before then if symptoms worsen, fevers develop. Eduction on dx and  mgmt provided - work excuse note provided to keep leg elevated until follow up this week  Problem List Items Addressed This Visit   CELLULITIS/ABSCESS, LEG - Primary     Hx same requiring hospitalization 2008 Will check labs and start septra now follow up in 72h for recheck clinically, pt to call sooner if problems    Relevant Orders      Basic metabolic panel      Hemoglobin A1c      CBC with Differential   DIABETES MELLITUS, TYPE II      There have been some probable medication, dietary and lifestyle compliance issues here. I have discussed with him the great importance of following the  treatment plan exactly as directed in order to achieve a good medical outcome. Check a1c now, titrate as needed Lab Results  Component Value Date   HGBA1C 10.0* 07/06/2011        Relevant Orders      Basic metabolic panel      Hemoglobin A1c    Other Visit Diagnoses   Type II or unspecified type diabetes mellitus with neurological manifestations, uncontrolled          Chronic R great toe ulcer noted - doubt source of cellulitis as affected skin above ankle not confluent with skin breakdown - advised attention to wound care and closer mgmt of DM

## 2013-10-23 NOTE — Progress Notes (Signed)
Pre-visit discussion using our clinic review tool. No additional management support is needed unless otherwise documented below in the visit note.  

## 2013-10-23 NOTE — Assessment & Plan Note (Signed)
There have been some probable medication, dietary and lifestyle compliance issues here. I have discussed with him the great importance of following the treatment plan exactly as directed in order to achieve a good medical outcome. Check a1c now, titrate as needed Lab Results  Component Value Date   HGBA1C 10.0* 07/06/2011

## 2013-10-23 NOTE — Assessment & Plan Note (Signed)
Hx same requiring hospitalization 2008 Will check labs and start septra now follow up in 72h for recheck clinically, pt to call sooner if problems

## 2013-10-24 MED ORDER — SITAGLIPTIN PHOSPHATE 100 MG PO TABS: 100.0000 mg | ORAL_TABLET | Freq: Every day | ORAL | Status: DC

## 2013-10-24 NOTE — Addendum Note (Signed)
Addended by: Gwendolyn Grant A on: 10/24/2013 01:20 PM   Modules accepted: Orders

## 2013-10-26 ENCOUNTER — Ambulatory Visit (INDEPENDENT_AMBULATORY_CARE_PROVIDER_SITE_OTHER): Payer: BC Managed Care – PPO | Admitting: Internal Medicine

## 2013-10-26 ENCOUNTER — Encounter: Payer: Self-pay | Admitting: *Deleted

## 2013-10-26 ENCOUNTER — Inpatient Hospital Stay (HOSPITAL_COMMUNITY)
Admission: AD | Admit: 2013-10-26 | Discharge: 2013-10-30 | DRG: 603 | Disposition: A | Discharge status: Home / Self Care | Payer: BC Managed Care – PPO | Source: Ambulatory Visit | Attending: Internal Medicine | Admitting: Internal Medicine

## 2013-10-26 ENCOUNTER — Encounter (HOSPITAL_COMMUNITY): Payer: Self-pay | Admitting: General Practice

## 2013-10-26 ENCOUNTER — Encounter: Payer: Self-pay | Admitting: Internal Medicine

## 2013-10-26 VITALS — BP 132/82 | HR 85 | Temp 97.7°F | Wt 399.8 lb

## 2013-10-26 DIAGNOSIS — G4733 Obstructive sleep apnea (adult) (pediatric): Secondary | ICD-10-CM

## 2013-10-26 DIAGNOSIS — F1020 Alcohol dependence, uncomplicated: Secondary | ICD-10-CM | POA: Diagnosis present

## 2013-10-26 DIAGNOSIS — M79609 Pain in unspecified limb: Secondary | ICD-10-CM

## 2013-10-26 DIAGNOSIS — L0390 Cellulitis, unspecified: Secondary | ICD-10-CM

## 2013-10-26 DIAGNOSIS — I509 Heart failure, unspecified: Secondary | ICD-10-CM | POA: Diagnosis present

## 2013-10-26 DIAGNOSIS — I872 Venous insufficiency (chronic) (peripheral): Secondary | ICD-10-CM | POA: Diagnosis present

## 2013-10-26 DIAGNOSIS — D126 Benign neoplasm of colon, unspecified: Secondary | ICD-10-CM

## 2013-10-26 DIAGNOSIS — K5730 Diverticulosis of large intestine without perforation or abscess without bleeding: Secondary | ICD-10-CM

## 2013-10-26 DIAGNOSIS — M545 Low back pain, unspecified: Secondary | ICD-10-CM

## 2013-10-26 DIAGNOSIS — E8881 Metabolic syndrome: Secondary | ICD-10-CM | POA: Diagnosis present

## 2013-10-26 DIAGNOSIS — E785 Hyperlipidemia, unspecified: Secondary | ICD-10-CM

## 2013-10-26 DIAGNOSIS — F528 Other sexual dysfunction not due to a substance or known physiological condition: Secondary | ICD-10-CM

## 2013-10-26 DIAGNOSIS — L02419 Cutaneous abscess of limb, unspecified: Principal | ICD-10-CM | POA: Diagnosis present

## 2013-10-26 DIAGNOSIS — E11621 Type 2 diabetes mellitus with foot ulcer: Secondary | ICD-10-CM

## 2013-10-26 DIAGNOSIS — Z7982 Long term (current) use of aspirin: Secondary | ICD-10-CM

## 2013-10-26 DIAGNOSIS — E119 Type 2 diabetes mellitus without complications: Secondary | ICD-10-CM

## 2013-10-26 DIAGNOSIS — L97509 Non-pressure chronic ulcer of other part of unspecified foot with unspecified severity: Secondary | ICD-10-CM

## 2013-10-26 DIAGNOSIS — E1169 Type 2 diabetes mellitus with other specified complication: Secondary | ICD-10-CM

## 2013-10-26 DIAGNOSIS — L03119 Cellulitis of unspecified part of limb: Principal | ICD-10-CM

## 2013-10-26 DIAGNOSIS — I10 Essential (primary) hypertension: Secondary | ICD-10-CM

## 2013-10-26 DIAGNOSIS — M109 Gout, unspecified: Secondary | ICD-10-CM | POA: Diagnosis present

## 2013-10-26 DIAGNOSIS — Z79899 Other long term (current) drug therapy: Secondary | ICD-10-CM

## 2013-10-26 DIAGNOSIS — E1149 Type 2 diabetes mellitus with other diabetic neurological complication: Secondary | ICD-10-CM

## 2013-10-26 DIAGNOSIS — Z6841 Body Mass Index (BMI) 40.0 and over, adult: Secondary | ICD-10-CM

## 2013-10-26 DIAGNOSIS — E1165 Type 2 diabetes mellitus with hyperglycemia: Secondary | ICD-10-CM

## 2013-10-26 DIAGNOSIS — Z86010 Personal history of colonic polyps: Secondary | ICD-10-CM

## 2013-10-26 DIAGNOSIS — Z23 Encounter for immunization: Secondary | ICD-10-CM

## 2013-10-26 DIAGNOSIS — Z0000 Encounter for general adult medical examination without abnormal findings: Secondary | ICD-10-CM

## 2013-10-26 DIAGNOSIS — IMO0001 Reserved for inherently not codable concepts without codable children: Secondary | ICD-10-CM | POA: Diagnosis present

## 2013-10-26 HISTORY — DX: Unspecified osteoarthritis, unspecified site: M19.90

## 2013-10-26 HISTORY — DX: Cellulitis, unspecified: L03.90

## 2013-10-26 LAB — CBC WITH DIFFERENTIAL/PLATELET
BASOS PCT: 0 % (ref 0–1)
Basophils Absolute: 0 10*3/uL (ref 0.0–0.1)
EOS ABS: 0.1 10*3/uL (ref 0.0–0.7)
Eosinophils Relative: 1 % (ref 0–5)
HCT: 37 % — ABNORMAL LOW (ref 39.0–52.0)
HEMOGLOBIN: 12.7 g/dL — AB (ref 13.0–17.0)
Lymphocytes Relative: 12 % (ref 12–46)
Lymphs Abs: 1.1 10*3/uL (ref 0.7–4.0)
MCH: 30.8 pg (ref 26.0–34.0)
MCHC: 34.3 g/dL (ref 30.0–36.0)
MCV: 89.8 fL (ref 78.0–100.0)
Monocytes Absolute: 0.9 10*3/uL (ref 0.1–1.0)
Monocytes Relative: 10 % (ref 3–12)
NEUTROS PCT: 77 % (ref 43–77)
Neutro Abs: 6.9 10*3/uL (ref 1.7–7.7)
Platelets: 239 10*3/uL (ref 150–400)
RBC: 4.12 MIL/uL — ABNORMAL LOW (ref 4.22–5.81)
RDW: 13.3 % (ref 11.5–15.5)
WBC: 9 10*3/uL (ref 4.0–10.5)

## 2013-10-26 LAB — COMPREHENSIVE METABOLIC PANEL
ALT: 60 U/L — AB (ref 0–53)
AST: 31 U/L (ref 0–37)
Albumin: 2.8 g/dL — ABNORMAL LOW (ref 3.5–5.2)
Alkaline Phosphatase: 87 U/L (ref 39–117)
BILIRUBIN TOTAL: 0.5 mg/dL (ref 0.3–1.2)
BUN: 10 mg/dL (ref 6–23)
CHLORIDE: 95 meq/L — AB (ref 96–112)
CO2: 27 mEq/L (ref 19–32)
Calcium: 9 mg/dL (ref 8.4–10.5)
Creatinine, Ser: 0.79 mg/dL (ref 0.50–1.35)
GFR calc Af Amer: >90 mL/min (ref >90)
Glucose, Bld: 340 mg/dL — ABNORMAL HIGH (ref 70–99)
Potassium: 4.4 mEq/L (ref 3.7–5.3)
SODIUM: 134 meq/L — AB (ref 137–147)
Total Protein: 7.8 g/dL (ref 6.0–8.3)

## 2013-10-26 LAB — GLUCOSE, CAPILLARY
Glucose-Capillary: 337 mg/dL — ABNORMAL HIGH (ref 70–99)
Glucose-Capillary: 273 mg/dL — ABNORMAL HIGH (ref 70–99)
Glucose-Capillary: 294 mg/dL — ABNORMAL HIGH (ref 70–99)

## 2013-10-26 MED ORDER — VANCOMYCIN HCL 10 G IV SOLR
1500.0000 mg | Freq: Two times a day (BID) | INTRAVENOUS | Status: DC
  Administered 2013-10-26 – 2013-10-29 (×5): 1500 mg via INTRAVENOUS
  Filled 2013-10-26 (×5): qty 1500

## 2013-10-26 MED ORDER — ENOXAPARIN SODIUM 40 MG/0.4ML ~~LOC~~ SOLN
40.0000 mg | SUBCUTANEOUS | Status: DC
Start: 2013-10-26 — End: 2013-10-27
  Administered 2013-10-26: 40 mg via SUBCUTANEOUS
  Filled 2013-10-26 (×2): qty 0.4

## 2013-10-26 MED ORDER — ASPIRIN EC 81 MG PO TBEC
81.0000 mg | DELAYED_RELEASE_TABLET | Freq: Every day | ORAL | Status: DC
  Administered 2013-10-26 – 2013-10-30 (×5): 81 mg via ORAL
  Filled 2013-10-26 (×5): qty 1

## 2013-10-26 MED ORDER — LINAGLIPTIN 5 MG PO TABS
5.0000 mg | ORAL_TABLET | Freq: Every day | ORAL | Status: DC
  Administered 2013-10-27 – 2013-10-30 (×4): 5 mg via ORAL
  Filled 2013-10-26 (×5): qty 1

## 2013-10-26 MED ORDER — HYDROCODONE-ACETAMINOPHEN 5-325 MG PO TABS: 1.0000 | ORAL_TABLET | ORAL | Status: DC | PRN

## 2013-10-26 MED ORDER — INSULIN DETEMIR 100 UNIT/ML ~~LOC~~ SOLN
25.0000 [IU] | Freq: Every day | SUBCUTANEOUS | Status: DC
  Administered 2013-10-26: 25 [IU] via SUBCUTANEOUS
  Filled 2013-10-26 (×2): qty 0.25

## 2013-10-26 MED ORDER — AMLODIPINE BESYLATE 5 MG PO TABS
5.0000 mg | ORAL_TABLET | Freq: Every day | ORAL | Status: DC
  Administered 2013-10-27 – 2013-10-28 (×2): 5 mg via ORAL
  Filled 2013-10-26 (×3): qty 1

## 2013-10-26 MED ORDER — AMLODIPINE-OLMESARTAN 5-40 MG PO TABS: 1.0000 | ORAL_TABLET | Freq: Every day | ORAL | Status: DC

## 2013-10-26 MED ORDER — VANCOMYCIN HCL 10 G IV SOLR
2000.0000 mg | Freq: Once | INTRAVENOUS | Status: AC
  Administered 2013-10-26: 2000 mg via INTRAVENOUS
  Filled 2013-10-26: qty 2000

## 2013-10-26 MED ORDER — INSULIN ASPART 100 UNIT/ML ~~LOC~~ SOLN
0.0000 [IU] | Freq: Every day | SUBCUTANEOUS | Status: DC
  Administered 2013-10-26: 3 [IU] via SUBCUTANEOUS
  Administered 2013-10-27: 2 [IU] via SUBCUTANEOUS

## 2013-10-26 MED ORDER — ONDANSETRON HCL 4 MG/2ML IJ SOLN: 4.0000 mg | Freq: Four times a day (QID) | INTRAMUSCULAR | Status: DC | PRN

## 2013-10-26 MED ORDER — ACETAMINOPHEN 650 MG RE SUPP: 650.0000 mg | Freq: Four times a day (QID) | RECTAL | Status: DC | PRN

## 2013-10-26 MED ORDER — INSULIN ASPART 100 UNIT/ML ~~LOC~~ SOLN
0.0000 [IU] | Freq: Three times a day (TID) | SUBCUTANEOUS | Status: DC
  Administered 2013-10-26: 11 [IU] via SUBCUTANEOUS
  Administered 2013-10-26: 15 [IU] via SUBCUTANEOUS
  Administered 2013-10-27: 11 [IU] via SUBCUTANEOUS
  Administered 2013-10-27: 7 [IU] via SUBCUTANEOUS
  Administered 2013-10-27: 15 [IU] via SUBCUTANEOUS
  Administered 2013-10-28: 4 [IU] via SUBCUTANEOUS
  Administered 2013-10-28 (×2): 7 [IU] via SUBCUTANEOUS
  Administered 2013-10-29: 4 [IU] via SUBCUTANEOUS
  Administered 2013-10-29: 11 [IU] via SUBCUTANEOUS
  Administered 2013-10-29 – 2013-10-30 (×3): 4 [IU] via SUBCUTANEOUS

## 2013-10-26 MED ORDER — ACETAMINOPHEN 325 MG PO TABS: 650.0000 mg | ORAL_TABLET | Freq: Four times a day (QID) | ORAL | Status: DC | PRN

## 2013-10-26 MED ORDER — ATORVASTATIN CALCIUM 10 MG PO TABS
10.0000 mg | ORAL_TABLET | Freq: Every day | ORAL | Status: DC
  Administered 2013-10-26 – 2013-10-29 (×4): 10 mg via ORAL
  Filled 2013-10-26 (×5): qty 1

## 2013-10-26 MED ORDER — IRBESARTAN 300 MG PO TABS
300.0000 mg | ORAL_TABLET | Freq: Every day | ORAL | Status: DC
  Administered 2013-10-27 – 2013-10-28 (×2): 300 mg via ORAL
  Filled 2013-10-26 (×3): qty 1

## 2013-10-26 MED ORDER — ONDANSETRON HCL 4 MG PO TABS: 4.0000 mg | ORAL_TABLET | Freq: Four times a day (QID) | ORAL | Status: DC | PRN

## 2013-10-26 MED ORDER — ALLOPURINOL 300 MG PO TABS
300.0000 mg | ORAL_TABLET | Freq: Every day | ORAL | Status: DC
  Filled 2013-10-26: qty 1

## 2013-10-26 MED ORDER — PNEUMOCOCCAL VAC POLYVALENT 25 MCG/0.5ML IJ INJ
0.5000 mL | INJECTION | INTRAMUSCULAR | Status: AC
  Administered 2013-10-27: 0.5 mL via INTRAMUSCULAR
  Filled 2013-10-26: qty 0.5

## 2013-10-26 MED ORDER — FUROSEMIDE 80 MG PO TABS
80.0000 mg | ORAL_TABLET | Freq: Two times a day (BID) | ORAL | Status: DC
  Administered 2013-10-26 – 2013-10-30 (×8): 80 mg via ORAL
  Filled 2013-10-26 (×10): qty 1

## 2013-10-26 MED ORDER — POTASSIUM CHLORIDE CRYS ER 20 MEQ PO TBCR
20.0000 meq | EXTENDED_RELEASE_TABLET | Freq: Two times a day (BID) | ORAL | Status: DC
  Administered 2013-10-26 – 2013-10-30 (×8): 20 meq via ORAL
  Filled 2013-10-26 (×10): qty 1

## 2013-10-26 NOTE — H&P (Signed)
[facility] History and Physical  Gerald Day [id number] DOB: Jun 09, 1956 DOA: 10/26/2013   PCP: Cathlean Cower, MD   Chief Complaint: Right lower extremity pain and erythema  HPI:  58 year old male with a history of metabolic syndrome, chronic CHF, diverticulosis, hyperlipidemia, and obstructive sleep apnea presents with four-day history of worsening right lower extremity pain, erythema, edema. The patient saw his primary care provider on 10/23/2013. The patient was started on Bactrim DS, 1 tablet twice a day. He stated that the erythema started around his calf region on 10/22/2013. He denied any antecedent trauma or injury. He states that he does wear shoes anytime he walks outside the house. He denied any fevers, chills, chest discomfort, shortness breath, nausea, vomiting, diarrhea, abdominal pain, dysuria, hematuria. The patient endorses compliance with the Bactrim DS. He went back to see his primary care provider on the day of admission. There was no improvement in the patient's pain, erythema, edema. As a result, the patient was directly admitted for intravenous antibiotics. The patient states that he had cellulitis in his lower extremities approximately 10 years ago requiring intravenous vancomycin. The patient also had recent blood work on 10/23/2013 that showed sodium 132, serum creatinine 0.9, WBC 10.1, hemoglobin A1c 9.8. Assessment/Plan: Cellulitis right lower extremity -Failed outpatient oral antibiotics -Start intravenous vancomycin -Venous Doppler right lower extremity rule out DVT Diabetes mellitus type 2, uncontrolled -10/23/2013 hemoglobin A1c 9.8 -I had a long discussion with the patient regarding the risks, benefits, and alternatives of starting insulin--he is very amenable to starting insulin during this admission -Consult diabetic educator to provide further education -The patient will need basal bolus dosing, but we'll await the patient's labs and CBG checks to  determine appropriate dosing of his long-acting insulin -Check lipids in the morning Chronic CHF -Appears compensated -Continue home dose of furosemide Hypertension -Continue home medications including amlodipine/olmesartan Hyperlipidemia -Continue statin Alcohol dependence -The patient drinks 2-3 beers daily for numerous years -place on CIWA      Past Medical History  Diagnosis Date  . LOW BACK PAIN   . CELLULITIS/ABSCESS, LEG 06/27/2007  . ERECTILE DYSFUNCTION, ORGANIC   . DIVERTICULOSIS, COLON   . CONGESTIVE HEART FAILURE   . HYPERTENSION   . SLEEP APNEA, OBSTRUCTIVE   . Morbid obesity   . GOUT NOS   . HYPERLIPIDEMIA   . DIABETES MELLITUS, TYPE II   . COLONIC POLYPS    Past Surgical History  Procedure Laterality Date  . Appendectomy    . Foot surgery      LT   Social History:  reports that he has never smoked. He does not have any smokeless tobacco history on file. He reports that he drinks alcohol. His drug history is not on file.   Family History  Problem Relation Age of Onset  . Cardiomyopathy Father   . Asthma Other      Allergies  Allergen Reactions  . Penicillins       Prior to Admission medications   Medication Sig Start Date End Date Taking? Authorizing Provider  allopurinol (ZYLOPRIM) 300 MG tablet Take 1 tablet (300 mg total) by mouth daily. 03/17/12   Biagio Borg, MD  aspirin EC 81 MG tablet Take 81 mg by mouth daily.      Historical Provider, MD  atorvastatin (LIPITOR) 10 MG tablet TAKE 1 TABLET DAILY 05/06/13   Biagio Borg, MD  AZOR 5-40 MG per tablet TAKE 1 TABLET DAILY 05/06/13   Biagio Borg, MD  furosemide (LASIX)  80 MG tablet Take 1 tablet (80 mg total) by mouth 2 (two) times daily. 03/17/12   Biagio Borg, MD  glucose blood (ONE TOUCH ULTRA TEST) test strip 1 each by Other route as needed. Use as instructed     Historical Provider, MD  KLOR-CON M20 20 MEQ tablet TAKE 2 TABLETS (40 MEQ) TWICE A DAY 04/08/13   Biagio Borg, MD  Lancets  MISC by Does not apply route.      Historical Provider, MD  metFORMIN (GLUCOPHAGE-XR) 500 MG 24 hr tablet 4 tabs by mouth per day 03/17/12   Biagio Borg, MD  sitaGLIPtin (JANUVIA) 100 MG tablet Take 1 tablet (100 mg total) by mouth daily. 10/24/13   Rowe Clack, MD  sulfamethoxazole-trimethoprim (SEPTRA DS) 800-160 MG per tablet Take 1 tablet by mouth 2 (two) times daily. 10/23/13   Rowe Clack, MD    Review of Systems:  Constitutional:  No weight loss, night sweats, Fevers, chills, fatigue.  Head&Eyes: No headache.  No vision loss.  No eye pain or scotoma ENT:  No Difficulty swallowing,Tooth/dental problems,Sore throat,  No ear ache, post nasal drip,  Cardio-vascular:  No chest pain, Orthopnea, PND,  dizziness, palpitations  GI:  No  abdominal pain, nausea, vomiting, diarrhea, loss of appetite, hematochezia, melena, heartburn, indigestion, Resp:  No shortness of breath with exertion or at rest. No cough. No coughing up of blood .No wheezing.No chest wall deformity  Skin:  no rash or lesions.  GU:  no dysuria, change in color of urine, no urgency or frequency. No flank pain.  Musculoskeletal:  No joint pain or swelling. No decreased range of motion. No back pain.  Psych:  No change in mood or affect. No depression or anxiety. Neurologic: No headache, no dysesthesia, no focal weakness, no vision loss. No syncope  Physical Exam: There were no vitals filed for this visit. General:  A&O x 3, NAD, nontoxic, pleasant/cooperative Head/Eye: No conjunctival hemorrhage, no icterus, [place]/AT, No nystagmus ENT:  No icterus,  No thrush, good dentition, no pharyngeal exudate Neck:  No masses, no lymphadenpathy, no bruits CV:  RRR, no rub, no gallop, no S3 Lung:  CTAB, good air movement, no wheeze, no rhonchi Abdomen: soft/NT, +BS, nondistended, no peritoneal signs Ext: No cyanosis, No petechiae, No lymphangitis, 3+ RLE edema with erythema extending from the patient's ankle to his  infrapatellar area-there is blanchable and non-blanchable aspects of erythema without any open wounds, necrosis. Compartment is soft. Left lower extremity with venous stasis changes. Right hallux ulceration without any erythema, drainage, crepitance.   Labs on Admission:  Basic Metabolic Panel:  Recent Labs Lab 10/23/13 1002  NA 132*  K 4.3  CL 97  CO2 27  GLUCOSE 319*  BUN 12  CREATININE 0.9  CALCIUM 8.7   Liver Function Tests: No results found for this basename: AST, ALT, ALKPHOS, BILITOT, PROT, ALBUMIN,  in the last 168 hours No results found for this basename: LIPASE, AMYLASE,  in the last 168 hours No results found for this basename: AMMONIA,  in the last 168 hours CBC:  Recent Labs Lab 10/23/13 1002  WBC 10.1  NEUTROABS 8.5*  HGB 13.5  HCT 40.8  MCV 91.1  PLT 152.0   Cardiac Enzymes: No results found for this basename: CKTOTAL, CKMB, CKMBINDEX, TROPONINI,  in the last 168 hours BNP: No components found with this basename: POCBNP,  CBG: No results found for this basename: GLUCAP,  in the last 168 hours  Radiological  Exams on Admission: No results found.      Time spent:60 minutes Code Status:   FULL Family Communication:   Family at bedside   Jacy Howat, DO  [facility] Pager [phone]  If 7PM-7AM, please contact night-coverage [email or web address] Password [facility] 10/26/2013, 11:47 AM

## 2013-10-26 NOTE — Patient Instructions (Signed)
Please proceed to admitting at [facility] where they are expecting you -   Will admit for IV antibiotics to treat your leg cellulitis  Please plan follow up with Dr Jenny Reichmann in 1-2 weeks after discharge

## 2013-10-26 NOTE — Assessment & Plan Note (Signed)
Reports compliance with atorva Check lipids, titrate as needed

## 2013-10-26 NOTE — Progress Notes (Addendum)
Inpatient Diabetes Program Recommendations  AACE/ADA: New Consensus Statement on Inpatient Glycemic Control (2013)  Target Ranges:  Prepandial:   less than 140 mg/dL      Peak postprandial:   less than 180 mg/dL (1-2 hours)      Critically ill patients:  140 - 180 mg/dL     Results for NIKALAS, BRAMEL (MRN [id number]) as of 10/26/2013 15:11  Ref. Range 10/26/2013 12:48  Glucose-Capillary Latest Range: 70-99 mg/dL 337 (H)    Results for YUREM, VINER (MRN [id number]) as of 10/26/2013 15:11  Ref. Range 10/23/2013 10:02  Hemoglobin A1C Latest Range: 4.6-6.5 % 9.8 (H)    **Admitted with R LE cellulitis.  Noted Dr. Carles Collet plans to send patient home on insulin.  **Through chart review, noted that patient saw Dr. Asa Lente (with [facility]) on 02/16.  Patient was given Rx to start Januvia in addition to his home Metformin for elevated A1c.  Per patient, he did not start Januvia at home yet.  Patient told me he spoke with Dr. Carles Collet about starting insulin.  Patient told me he is OK using insulin for now but would like to try to transition off of it as his cellulitis improves.  Patient stated he would prefer to use insulin pens at home.  Has [employer].  **Educated patient on insulin pen use at home.  Reviewed contents of insulin flexpen starter kit.  Reviewed all steps if insulin pen including attachment of needle, 2-unit air shot, dialing up dose, giving injection, removing needle, disposal of sharps, storage of unused insulin, disposal of insulin etc.  Patient able to provide successful return demonstration.  Also reviewed troubleshooting with insulin pen.  MD to give patient Rxs for insulin pens and insulin pen needles.  **Reviewed s/sxs of hypoglycemia with patient and also how to treat    MD- Patient will need the following insulin Rxs at d/c:  1. Levemir flexpen 2. Novolog flexpen (if Novolog desired at d/c) 3. Insulin pen needles (32 gauge X 35m)   Will follow. JWyn QuakerRN, MSN, CDE Diabetes Coordinator Inpatient Diabetes Program Team Pager: [phone](8a-10p)

## 2013-10-26 NOTE — Progress Notes (Signed)
ANTIBIOTIC CONSULT NOTE - INITIAL  Pharmacy Consult for Vancomycin Indication: cellulitis  Allergies  Allergen Reactions  . Penicillins Other (See Comments)    Unknown allergy - childhood reaction    Microbiology: No results found for this or any previous visit (from the past 720 hour(s)).  Medical History: Past Medical History  Diagnosis Date  . LOW BACK PAIN   . CELLULITIS/ABSCESS, LEG 06/27/2007  . ERECTILE DYSFUNCTION, ORGANIC   . DIVERTICULOSIS, COLON   . CONGESTIVE HEART FAILURE   . HYPERTENSION   . SLEEP APNEA, OBSTRUCTIVE   . Morbid obesity   . GOUT NOS   . HYPERLIPIDEMIA   . DIABETES MELLITUS, TYPE II   . COLONIC POLYPS     Assessment: 58 year old male to begin vancomycin for cellultiis  Goal of Therapy:  Vancomycin trough level 10-15 mcg/ml  Plan:  1) Vancomycin 2000 mg iv x 1 dose then 1500 mg iv Q 12 hours 2) Follow up Scr, fever trend, progress, cultures  Thank you. Anette Guarneri, PharmD [phone]  10/26/2013,12:49 PM

## 2013-10-26 NOTE — Progress Notes (Signed)
Subjective:    Patient ID: Gerald Day, male    DOB: Sep 26, 1955, 58 y.o.   MRN: [id number]  Date of admission: 10/26/2013  HPI Mr. Gerald Day is a 58 yo male with a history of chronic peripheral edema and diabetes.  On 10/23/13 he presented with R lower leg swelling and redness that started on 10/22/13.   Today leg is more painful (6/10) especially with ambulation, more swollen and the borders of the redness have increased slightly since 10/23/13 despite oral antibiotic treatment with Septra DS.  He has no fever or chills, N/V, SOB, CP, or history of DVT. States he hasn't had an appetite since 10/22/13. He has had cellulitis about 10 years ago that was treated with IV antibiotics.    Past Medical History  Diagnosis Date  . LOW BACK PAIN   . CELLULITIS/ABSCESS, LEG 06/27/2007  . ERECTILE DYSFUNCTION, ORGANIC   . DIVERTICULOSIS, COLON   . CONGESTIVE HEART FAILURE   . HYPERTENSION   . SLEEP APNEA, OBSTRUCTIVE   . Morbid obesity   . GOUT NOS   . HYPERLIPIDEMIA   . DIABETES MELLITUS, TYPE II   . COLONIC POLYPS    Family History  Problem Relation Age of Onset  . Cardiomyopathy Father   . Asthma Other    History   Social History  . Marital Status: Married    Spouse Name: N/A    Number of Children: N/A  . Years of Education: N/A   Social History Main Topics  . Smoking status: Never Smoker   . Smokeless tobacco: None  . Alcohol Use: Yes  . Drug Use:   . Sexual Activity:    Other Topics Concern  . None   Social History Narrative  . None    Review of Systems  Constitutional: Positive for appetite change. Negative for fever, chills and diaphoresis.  HENT: Negative for congestion and rhinorrhea.   Respiratory: Negative for chest tightness and shortness of breath.   Cardiovascular: Negative for chest pain and palpitations.  Gastrointestinal: Negative for nausea, vomiting, abdominal pain, diarrhea and constipation.  Skin: Positive for color change and rash.       Redness  and swelling  Have increased some since Monday.  Compliant w/ oral abx treatment.  Neurological: Negative for dizziness and headaches.  Psychiatric/Behavioral: Negative for confusion and agitation.       Objective:   Physical Exam  Constitutional: He is oriented to person, place, and time. He appears well-developed and well-nourished. No distress.  Obese, NAD  HENT:  Head: Normocephalic and atraumatic.  Eyes: Conjunctivae are normal. Pupils are equal, round, and reactive to light.  Neck: Normal range of motion. Neck supple.  Cardiovascular: Normal rate, regular rhythm and normal heart sounds.   Pulmonary/Chest: No stridor.  Musculoskeletal: Normal range of motion. He exhibits no edema (R lower leg).  Neurological: He is alert and oriented to person, place, and time.  Skin: Skin is warm. He is not diaphoretic. There is erythema (R Lower Leg).  Right lower extremity with erythema, edema, and warmth.  Right great toe ulcer.   Psychiatric: He has a normal mood and affect. His behavior is normal. Judgment and thought content normal.    Filed Vitals:   10/26/13 0906  BP: 132/82  Pulse: 85  Temp: 97.7 F (36.5 C)   Wt Readings from Last 3 Encounters:  10/26/13 399 lb 12.8 oz (181.348 kg)  10/23/13 406 lb 12.8 oz (184.523 kg)  03/17/12 402 lb 6 oz (182.516  kg)     Lab Results  Component Value Date   WBC 10.1 10/23/2013   HGB 13.5 10/23/2013   HCT 40.8 10/23/2013   PLT 152.0 10/23/2013   GLUCOSE 319* 10/23/2013   CHOL 187 07/06/2011   TRIG 274.0* 07/06/2011   HDL 47.90 07/06/2011   LDLDIRECT 115.0 07/06/2011   LDLCALC 108* 09/02/2009   ALT 39 02/27/2010   AST 21 02/27/2010   NA 132* 10/23/2013   K 4.3 10/23/2013   CL 97 10/23/2013   CREATININE 0.9 10/23/2013   BUN 12 10/23/2013   CO2 27 10/23/2013   TSH 3.33 09/02/2009   PSA 0.26 09/02/2009   HGBA1C 9.8* 10/23/2013   MICROALBUR 4.9* 09/02/2009    Lab Results  Component Value Date   WBC 10.1 10/23/2013   HGB 13.5 10/23/2013    HCT 40.8 10/23/2013   PLT 152.0 10/23/2013   GLUCOSE 319* 10/23/2013   CHOL 187 07/06/2011   TRIG 274.0* 07/06/2011   HDL 47.90 07/06/2011   LDLDIRECT 115.0 07/06/2011   LDLCALC 108* 09/02/2009   ALT 39 02/27/2010   AST 21 02/27/2010   NA 132* 10/23/2013   K 4.3 10/23/2013   CL 97 10/23/2013   CREATININE 0.9 10/23/2013   BUN 12 10/23/2013   CO2 27 10/23/2013   TSH 3.33 09/02/2009   PSA 0.26 09/02/2009   HGBA1C 9.8* 10/23/2013   MICROALBUR 4.9* 09/02/2009       Assessment & Plan:   1.  Cellulitis right lower extremity - worsening without significant response to or Septra DS started 10/23/13.  He will need to be admitted.  Check CBC (especially white count), Blood Cx.  He will need IV abx treatment.  Likely IV Vancomycin, which has worked in the past, but considering culture results.  Consider venous doppler to r/o DVT.  2. Type 2 DM uncontrolled.  Last A1C 9.8 (10/23/13).  He has not taken his prescribed medications regularly or monitored blood glucose for same and seen his PCP regularly.  This likely has contributed significantly to and has exacerbated #1 this occurrence and in the past.  There have been some probable medication, dietary and lifestyle compliance issues here. I have discussed with him the great importance of following the treatment plan exactly as directed in order to achieve a good medical outcome.  HC Martensen, PA-S  I have personally reviewed this case with PA student. I also personally examined this patient. I agree with history and findings as documented above. I reviewed, discussed and approve of the assessment and plan as listed above. Gwendolyn Grant, MD

## 2013-10-26 NOTE — Assessment & Plan Note (Signed)
There have been some probable medication, dietary and lifestyle compliance issues here. I have discussed with him the great importance of following the treatment plan exactly as directed in order to achieve a good medical outcome.  Januvia added (10/23/13) to ongoing metformin - encouraged compliance with same Note chronic R great toe ulcer - refer to podiatry for assistance with mgmt and observation of same  Lab Results  Component Value Date   HGBA1C 9.8* 10/23/2013

## 2013-10-26 NOTE — Progress Notes (Signed)
*  PRELIMINARY RESULTS* Vascular Ultrasound Right lower extremity venous duplex has been completed.  Preliminary findings: no evidence of DVT.   Landry Mellow, RDMS, RVT  10/26/2013, 2:36 PM

## 2013-10-26 NOTE — Progress Notes (Signed)
Pre-visit discussion using our clinic review tool. No additional management support is needed unless otherwise documented below in the visit note.  

## 2013-10-26 NOTE — Progress Notes (Signed)
Subjective:    Patient ID: Gerald Day, male    DOB: 09/07/1956, 58 y.o.   MRN: [id number]  HPI  Here for follow up RLE cellulitis -seen 72h ago here for same - taking bactrim - reports redness and pain not improved, swelling not significantly changed  DM - pt reports compliance with Metformin. Does not check blood sugars at home. Has not yet begun Januvia  Past Medical History  Diagnosis Date  . LOW BACK PAIN   . CELLULITIS/ABSCESS, LEG 06/27/2007  . ERECTILE DYSFUNCTION, ORGANIC   . DIVERTICULOSIS, COLON   . CONGESTIVE HEART FAILURE   . HYPERTENSION   . SLEEP APNEA, OBSTRUCTIVE   . Morbid obesity   . GOUT NOS   . HYPERLIPIDEMIA   . DIABETES MELLITUS, TYPE II   . COLONIC POLYPS      Review of Systems  Constitutional: Positive for fatigue. Negative for fever and unexpected weight change.  Respiratory: Negative for cough and shortness of breath.   Cardiovascular: Positive for leg swelling (but unchanged in past 72, R>L (chronic)). Negative for chest pain.       Objective:   Physical Exam  Wt Readings from Last 3 Encounters:  10/26/13 399 lb 12.8 oz (181.348 kg)  10/23/13 406 lb 12.8 oz (184.523 kg)  03/17/12 402 lb 6 oz (182.516 kg)  Constitutional: he is obese, nontoxic -appears well-developed and well-nourished. No distress. Wife at side HENT: Head: Normocephalic and atraumatic. Ears: B TMs ok, no erythema or effusion; Nose: Nose normal. Mouth/Throat: Oropharynx is clear and moist. No oropharyngeal exudate.  Eyes: Conjunctivae and EOM are normal. Pupils are equal, round, and reactive to light. No scleral icterus.  Neck: Normal range of motion. Neck supple. No JVD present. No thyromegaly present.  Cardiovascular: Normal rate, regular rhythm and normal heart sounds.  No murmur heard. tight 2+ RLE edema.- see skin below Pulmonary/Chest: Effort normal and breath sounds normal. No respiratory distress. he has no wheezes.  Abdominal: Soft. Bowel sounds are normal. he  exhibits no distension. There is no tenderness. no masses Musculoskeletal: Normal range of motion, no joint effusions. No gross deformities Neurological: he is alert and oriented to person, place, and time. No cranial nerve deficit. Coordination, balance, strength, speech and gait are normal.  Skin: deep violaceous change, confluent erythema RLE knee to above ankle - slight progression in past 72h beyond "ink line" below knee - no abscess or open wound on leg appreciated (chronic R great toe wound unchanged) -warm to touch and tender -remaining skin is warm and dry. No diffuse rash noted. No other erythema.  Psychiatric: he has a normal mood and affect. behavior is normal. Judgment and thought content normal.   Lab Results  Component Value Date   WBC 10.1 10/23/2013   HGB 13.5 10/23/2013   HCT 40.8 10/23/2013   PLT 152.0 10/23/2013   GLUCOSE 319* 10/23/2013   CHOL 187 07/06/2011   TRIG 274.0* 07/06/2011   HDL 47.90 07/06/2011   LDLDIRECT 115.0 07/06/2011   LDLCALC 108* 09/02/2009   ALT 39 02/27/2010   AST 21 02/27/2010   NA 132* 10/23/2013   K 4.3 10/23/2013   CL 97 10/23/2013   CREATININE 0.9 10/23/2013   BUN 12 10/23/2013   CO2 27 10/23/2013   TSH 3.33 09/02/2009   PSA 0.26 09/02/2009   HGBA1C 9.8* 10/23/2013   MICROALBUR 4.9* 09/02/2009       Assessment & Plan:   Right lower extremity cellulitis - not improving on Septra  DS 72h, - refer for IP care to include IV abx and IP observation/support to include pain control - Instructed to keep leg elevated as tolerated - work excuse stating our of work until further notice provided today  Problem List Items Addressed This Visit   DIABETES MELLITUS, TYPE II      There have been some probable medication, dietary and lifestyle compliance issues here. I have discussed with him the great importance of following the treatment plan exactly as directed in order to achieve a good medical outcome.  Januvia added (10/23/13) to ongoing metformin -  encouraged compliance with same Note chronic R great toe ulcer - refer to podiatry for assistance with mgmt and observation of same  Lab Results  Component Value Date   HGBA1C 9.8* 10/23/2013       HYPERLIPIDEMIA     Reports compliance with atorva Check lipids, titrate as needed    HYPERTENSION      BP Readings from Last 3 Encounters:  10/26/13 132/82  10/23/13 130/92  03/17/12 138/100   The current medical regimen is effective;  continue present plan and medications.      Other Visit Diagnoses   Cellulitis and abscess of leg    -  Primary    Type II or unspecified type diabetes mellitus with neurological manifestations, uncontrolled        Relevant Orders       Ambulatory referral to Podiatry    DM type 2 with diabetic foot ulcer           Time spent with pt/family today 45 minutes, greater than 50% time spent counseling patient on cellulitis requiring hospitalization for IV abx, uncontrolled diabetes and medication review. Also review of prior records and coordination of admission

## 2013-10-26 NOTE — Progress Notes (Signed)
Patient has home CPAP and set himself up. Machine to be checked by [facility] but I checked wire myself and no fraying or breaks noted. Patient explained to that if he needed help to just call Rn to call Rt. Will continue to monitor.

## 2013-10-26 NOTE — Assessment & Plan Note (Signed)
BP Readings from Last 3 Encounters:  10/26/13 132/82  10/23/13 130/92  03/17/12 138/100   The current medical regimen is effective;  continue present plan and medications.

## 2013-10-27 ENCOUNTER — Telehealth: Payer: Self-pay

## 2013-10-27 LAB — BASIC METABOLIC PANEL
BUN: 11 mg/dL (ref 6–23)
CALCIUM: 8.4 mg/dL (ref 8.4–10.5)
CO2: 26 mEq/L (ref 19–32)
CREATININE: 0.85 mg/dL (ref 0.50–1.35)
Chloride: 99 mEq/L (ref 96–112)
GFR calc Af Amer: >90 mL/min (ref >90)
GFR calc non Af Amer: >90 mL/min (ref >90)
Glucose, Bld: 274 mg/dL — ABNORMAL HIGH (ref 70–99)
Potassium: 4.3 mEq/L (ref 3.7–5.3)
Sodium: 137 mEq/L (ref 137–147)

## 2013-10-27 LAB — CBC
HCT: 34.4 % — ABNORMAL LOW (ref 39.0–52.0)
Hemoglobin: 11.9 g/dL — ABNORMAL LOW (ref 13.0–17.0)
MCH: 31 pg (ref 26.0–34.0)
MCHC: 34.6 g/dL (ref 30.0–36.0)
MCV: 89.6 fL (ref 78.0–100.0)
PLATELETS: 233 10*3/uL (ref 150–400)
RBC: 3.84 MIL/uL — ABNORMAL LOW (ref 4.22–5.81)
RDW: 13.4 % (ref 11.5–15.5)
WBC: 7.9 10*3/uL (ref 4.0–10.5)

## 2013-10-27 LAB — GLUCOSE, CAPILLARY
GLUCOSE-CAPILLARY: 247 mg/dL — AB (ref 70–99)
Glucose-Capillary: 266 mg/dL — ABNORMAL HIGH (ref 70–99)
Glucose-Capillary: 309 mg/dL — ABNORMAL HIGH (ref 70–99)
Glucose-Capillary: 246 mg/dL — ABNORMAL HIGH (ref 70–99)
Glucose-Capillary: 235 mg/dL — ABNORMAL HIGH (ref 70–99)

## 2013-10-27 MED ORDER — INSULIN DETEMIR 100 UNIT/ML ~~LOC~~ SOLN
35.0000 [IU] | Freq: Every day | SUBCUTANEOUS | Status: DC
  Administered 2013-10-27: 35 [IU] via SUBCUTANEOUS
  Filled 2013-10-27 (×2): qty 0.35

## 2013-10-27 MED ORDER — INSULIN ASPART 100 UNIT/ML ~~LOC~~ SOLN
4.0000 [IU] | Freq: Three times a day (TID) | SUBCUTANEOUS | Status: DC
  Administered 2013-10-27 – 2013-10-28 (×3): 4 [IU] via SUBCUTANEOUS

## 2013-10-27 MED ORDER — ENOXAPARIN SODIUM 80 MG/0.8ML ~~LOC~~ SOLN
80.0000 mg | SUBCUTANEOUS | Status: DC
  Administered 2013-10-27 – 2013-10-29 (×3): 80 mg via SUBCUTANEOUS
  Filled 2013-10-27 (×4): qty 0.8

## 2013-10-27 NOTE — Progress Notes (Signed)
[facility] PROGRESS NOTE  Gerald Day HTD:[id number] DOB: 1955-12-20 DOA: 10/26/2013 PCP: Cathlean Cower, MD  Assessment/Plan: Cellulitis right lower extremity  -Failed outpatient oral antibiotics  -continue intravenous vancomycin  -Venous Doppler right lower extremity--neg for DVT Diabetes mellitus type 2, uncontrolled  -10/23/2013 hemoglobin A1c 9.8  -I had a long discussion with the patient regarding the risks, benefits, and alternatives of starting insulin--he is very amenable to starting insulin during this admission  -Consult diabetic educator to provide further education  -Increase Levemir to 35 units at bedtime -Pre-meal NovoLog 4 units with each meal -Continue NovoLog sliding scale -Check lipids in the morning  Chronic CHF  -Appears compensated  -Continue home dose of furosemide  Hypertension  -Continue home medications including amlodipine/olmesartan  Hyperlipidemia  -Continue statin  Alcohol dependence  -The patient drinks 2-3 beers daily for numerous years  -place on CIWA   Family Communication:   Pt at beside Disposition Plan:   Home when medically stable  Antibiotics:  Vancomycin 10/26/13>>>    Procedures/Studies:  No results found.      Subjective: Patient states that his right leg is improving a little bit. Denies any fevers, chills, chest discomfort, shortness breath, nausea, vomiting, diarrhea, abdominal pain.  Objective: Filed Vitals:   10/26/13 2123 10/27/13 0626 10/27/13 0838 10/27/13 1148  BP: 153/78 146/79 166/87 153/81  Pulse: 82 85 75 81  Temp: 98.3 F (36.8 C) 98.1 F (36.7 C) 98.6 F (37 C) 98.1 F (36.7 C)  TempSrc: Oral Oral Oral Oral  Resp: 20 20 20 20   Height: 6' (1.829 m)     Weight: 180.35 kg (397 lb 9.6 oz)     SpO2: 94% 97% 96% 98%    Intake/Output Summary (Last 24 hours) at 10/27/13 1313 Last data filed at 10/27/13 0900  Gross per 24 hour  Intake    480 ml  Output    300 ml  Net    180 ml   Weight  change:  Exam:   General:  Pt is alert, follows commands appropriately, not in acute distress  HEENT: No icterus, No thrush, No neck mass, [place]/AT  Cardiovascular: RRR, S1/S2, no rubs, no gallops  Respiratory: CTA bilaterally, no wheezing, no crackles, no rhonchi  Abdomen: Soft/+BS, non tender, non distended, no guarding  Extremities: 2+ R>L LE edema, No lymphangitis, No petechiae,no synovitis; erythema extending from the infrapatellar area to the right ankle without any crepitance. There are non-blanchable and blanchable components no open or draining wounds on the right leg;   Data Reviewed: Basic Metabolic Panel:  Recent Labs Lab 10/23/13 1002 10/26/13 1232 10/27/13 0416  NA 132* 134* 137  K 4.3 4.4 4.3  CL 97 95* 99  CO2 27 27 26   GLUCOSE 319* 340* 274*  BUN 12 10 11   CREATININE 0.9 0.79 0.85  CALCIUM 8.7 9.0 8.4   Liver Function Tests:  Recent Labs Lab 10/26/13 1232  AST 31  ALT 60*  ALKPHOS 87  BILITOT 0.5  PROT 7.8  ALBUMIN 2.8*   No results found for this basename: LIPASE, AMYLASE,  in the last 168 hours No results found for this basename: AMMONIA,  in the last 168 hours CBC:  Recent Labs Lab 10/23/13 1002 10/26/13 1232 10/27/13 0416  WBC 10.1 9.0 7.9  NEUTROABS 8.5* 6.9  --   HGB 13.5 12.7* 11.9*  HCT 40.8 37.0* 34.4*  MCV 91.1 89.8 89.6  PLT 152.0 239 233   Cardiac Enzymes: No results found for this basename:  CKTOTAL, CKMB, CKMBINDEX, TROPONINI,  in the last 168 hours BNP: No components found with this basename: POCBNP,  CBG:  Recent Labs Lab 10/26/13 1248 10/26/13 1611 10/26/13 2120 10/27/13 0831 10/27/13 1146  GLUCAP 337* 273* 294* 266* 247*    No results found for this or any previous visit (from the past 240 hour(s)).   Scheduled Meds: . amLODipine  5 mg Oral Daily   And  . irbesartan  300 mg Oral Daily  . aspirin EC  81 mg Oral Daily  . atorvastatin  10 mg Oral q1800  . enoxaparin (LOVENOX) injection  80 mg Subcutaneous  Q24H  . furosemide  80 mg Oral BID  . insulin aspart  0-20 Units Subcutaneous TID WC  . insulin aspart  0-5 Units Subcutaneous QHS  . insulin detemir  25 Units Subcutaneous QHS  . linagliptin  5 mg Oral Daily  . potassium chloride SA  20 mEq Oral BID  . vancomycin  1,500 mg Intravenous Q12H   Continuous Infusions:    [name], DO  [facility] Pager [phone]  If 7PM-7AM, please contact night-coverage [email or web address] Password [email or web address] 10/27/2013, 1:13 PM   LOS: 1 day

## 2013-10-27 NOTE — Progress Notes (Signed)
Inpatient Diabetes Program Recommendations  AACE/ADA: New Consensus Statement on Inpatient Glycemic Control (2013)  Target Ranges:  Prepandial:   less than 140 mg/dL      Peak postprandial:   less than 180 mg/dL (1-2 hours)      Critically ill patients:  140 - 180 mg/dL   Reason for Visit: F/U regarding insulin pen instruction  Note:  Supportive visit at bedside.  Patient feels confident regarding use of insulin pens.  Informed him that meal coverage has been added, but it can given by pen as well.  Needs to be sure he doesn't get the two different kinds of insulins mixed up.  Patient concerned that if he starts insulin, he may not ever come off of it.  Assured him that this is not necessarily the case since he has type 2 diabetes.  Kaydance Bowie S. Marcelline Mates, RN, CNS, CDE Inpatient Diabetes Program, team pager [phone]

## 2013-10-27 NOTE — Progress Notes (Signed)
Received report from morning RN about patient banging his right leg (cellulitis present) during the day. Leg has been weeping, with increased weeping during the night abdominal pads in place. Notified NP on call, will continue to monitor.

## 2013-10-27 NOTE — Telephone Encounter (Signed)
[employer] Education mailed to patient

## 2013-10-28 LAB — GLUCOSE, CAPILLARY
GLUCOSE-CAPILLARY: 185 mg/dL — AB (ref 70–99)
Glucose-Capillary: 204 mg/dL — ABNORMAL HIGH (ref 70–99)
Glucose-Capillary: 190 mg/dL — ABNORMAL HIGH (ref 70–99)
Glucose-Capillary: 213 mg/dL — ABNORMAL HIGH (ref 70–99)

## 2013-10-28 LAB — CBC
HCT: 34.9 % — ABNORMAL LOW (ref 39.0–52.0)
Hemoglobin: 11.9 g/dL — ABNORMAL LOW (ref 13.0–17.0)
MCH: 30.5 pg (ref 26.0–34.0)
MCHC: 34.1 g/dL (ref 30.0–36.0)
MCV: 89.5 fL (ref 78.0–100.0)
Platelets: 271 10*3/uL (ref 150–400)
RBC: 3.9 MIL/uL — ABNORMAL LOW (ref 4.22–5.81)
RDW: 13.3 % (ref 11.5–15.5)
WBC: 9.2 10*3/uL (ref 4.0–10.5)

## 2013-10-28 LAB — BASIC METABOLIC PANEL
BUN: 10 mg/dL (ref 6–23)
CALCIUM: 8.4 mg/dL (ref 8.4–10.5)
CO2: 28 mEq/L (ref 19–32)
CREATININE: 0.88 mg/dL (ref 0.50–1.35)
Chloride: 96 mEq/L (ref 96–112)
GFR calc non Af Amer: >90 mL/min (ref >90)
Glucose, Bld: 228 mg/dL — ABNORMAL HIGH (ref 70–99)
Potassium: 4 mEq/L (ref 3.7–5.3)
Sodium: 135 mEq/L — ABNORMAL LOW (ref 137–147)

## 2013-10-28 MED ORDER — INSULIN DETEMIR 100 UNIT/ML ~~LOC~~ SOLN
42.0000 [IU] | Freq: Every day | SUBCUTANEOUS | Status: DC
  Administered 2013-10-28: 42 [IU] via SUBCUTANEOUS
  Filled 2013-10-28 (×2): qty 0.42

## 2013-10-28 MED ORDER — AMLODIPINE BESYLATE 10 MG PO TABS
10.0000 mg | ORAL_TABLET | Freq: Every day | ORAL | Status: DC
  Administered 2013-10-29 – 2013-10-30 (×2): 10 mg via ORAL
  Filled 2013-10-28 (×2): qty 1

## 2013-10-28 MED ORDER — INSULIN ASPART 100 UNIT/ML ~~LOC~~ SOLN
5.0000 [IU] | Freq: Three times a day (TID) | SUBCUTANEOUS | Status: DC
  Administered 2013-10-28 – 2013-10-29 (×3): 5 [IU] via SUBCUTANEOUS

## 2013-10-28 MED ORDER — IRBESARTAN 300 MG PO TABS
300.0000 mg | ORAL_TABLET | Freq: Every day | ORAL | Status: DC
  Administered 2013-10-29 – 2013-10-30 (×2): 300 mg via ORAL
  Filled 2013-10-28 (×2): qty 1

## 2013-10-28 MED ORDER — AMLODIPINE BESYLATE 5 MG PO TABS
5.0000 mg | ORAL_TABLET | Freq: Once | ORAL | Status: AC
  Administered 2013-10-28: 5 mg via ORAL
  Filled 2013-10-28: qty 1

## 2013-10-28 NOTE — Progress Notes (Signed)
[facility] PROGRESS NOTE  Gerald Day PIR:[id number] DOB: Jan 18, 1956 DOA: 10/26/2013 PCP: Cathlean Cower, MD  Assessment/Plan: Cellulitis right lower extremity  -Failed outpatient oral antibiotics  -continue intravenous vancomycin  -Venous Doppler right lower extremity--neg for DVT  Diabetes mellitus type 2, uncontrolled  -10/23/2013 hemoglobin A1c 9.8  -I had a long discussion with the patient regarding the risks, benefits, and alternatives of starting insulin--he is very amenable to starting insulin during this admission  -Consult diabetic educator to provide further education  -Increase Levemir to 42 units at bedtime  -Pre-meal NovoLog 5 units with each meal  -Continue NovoLog sliding scale  -Check lipids in the morning  Chronic CHF  -Appears compensated  -Continue home dose of furosemide  Hypertension  -Continue home medications including amlodipine/olmesartan  Hyperlipidemia  -Continue statin  Alcohol dependence  -The patient drinks 2-3 beers daily for numerous years  -place on CIWA  Family Communication: Pt at beside  Disposition Plan: Home when medically stable  Antibiotics:  Vancomycin 10/26/13>>>        Procedures/Studies:  No results found.      Subjective: Patient states that leg and improving. Denies any fevers, chills, chest discomfort, shortness breath, nausea, vomiting, diarrhea. Tolerating diet.  Objective: Filed Vitals:   10/27/13 2024 10/28/13 0436 10/28/13 0850 10/28/13 1315  BP: 165/56 152/68 184/84 145/81  Pulse: 90 73 108 96  Temp: 97.7 F (36.5 C) 98.1 F (36.7 C) 97.9 F (36.6 C) 97.9 F (36.6 C)  TempSrc: Oral Oral Oral Oral  Resp: 20 20 24 22   Height: 6' (1.829 m)     Weight: 180.35 kg (397 lb 9.6 oz)     SpO2: 97% 100% 99% 100%    Intake/Output Summary (Last 24 hours) at 10/28/13 1601 Last data filed at 10/28/13 1441  Gross per 24 hour  Intake   2840 ml  Output   4000 ml  Net  -1160 ml   Weight change: 0 kg (0  lb) Exam:   General:  Pt is alert, follows commands appropriately, not in acute distress  HEENT: No icterus, No thrush, [place]/AT  Cardiovascular: RRR, S1/S2, no rubs, no gallops  Respiratory: CTA bilaterally, no wheezing, no crackles, no rhonchi  Abdomen: Soft/+BS, non tender, non distended, no guarding  Extremities: 2+ RLE edema,  No petechiae, No rashes, no synovitis; erythema extending from the patient's right ankle up to the infrapatellar area without any crepitance. Small skin tear on the right cath with clear yellow drainage. No crepitance. No necrosis.  Data Reviewed: Basic Metabolic Panel:  Recent Labs Lab 10/23/13 1002 10/26/13 1232 10/27/13 0416 10/28/13 0355  NA 132* 134* 137 135*  K 4.3 4.4 4.3 4.0  CL 97 95* 99 96  CO2 27 27 26 28   GLUCOSE 319* 340* 274* 228*  BUN 12 10 11 10   CREATININE 0.9 0.79 0.85 0.88  CALCIUM 8.7 9.0 8.4 8.4   Liver Function Tests:  Recent Labs Lab 10/26/13 1232  AST 31  ALT 60*  ALKPHOS 87  BILITOT 0.5  PROT 7.8  ALBUMIN 2.8*   No results found for this basename: LIPASE, AMYLASE,  in the last 168 hours No results found for this basename: AMMONIA,  in the last 168 hours CBC:  Recent Labs Lab 10/23/13 1002 10/26/13 1232 10/27/13 0416 10/28/13 0355  WBC 10.1 9.0 7.9 9.2  NEUTROABS 8.5* 6.9  --   --   HGB 13.5 12.7* 11.9* 11.9*  HCT 40.8 37.0* 34.4* 34.9*  MCV 91.1 89.8 89.6  89.5  PLT 152.0 239 233 271   Cardiac Enzymes: No results found for this basename: CKTOTAL, CKMB, CKMBINDEX, TROPONINI,  in the last 168 hours BNP: No components found with this basename: POCBNP,  CBG:  Recent Labs Lab 10/27/13 1705 10/27/13 2028 10/27/13 2205 10/28/13 0759 10/28/13 1148  GLUCAP 309* 246* 235* 204* 190*    No results found for this or any previous visit (from the past 240 hour(s)).   Scheduled Meds: . [START ON 10/29/2013] amLODipine  10 mg Oral Daily   And  . [START ON 10/29/2013] irbesartan  300 mg Oral Daily  .  amLODipine  5 mg Oral Once  . aspirin EC  81 mg Oral Daily  . atorvastatin  10 mg Oral q1800  . enoxaparin (LOVENOX) injection  80 mg Subcutaneous Q24H  . furosemide  80 mg Oral BID  . insulin aspart  0-20 Units Subcutaneous TID WC  . insulin aspart  0-5 Units Subcutaneous QHS  . insulin aspart  5 Units Subcutaneous TID WC  . insulin detemir  42 Units Subcutaneous QHS  . linagliptin  5 mg Oral Daily  . potassium chloride SA  20 mEq Oral BID  . vancomycin  1,500 mg Intravenous Q12H   Continuous Infusions:    Freda Jaquith, DO  [facility] Pager [phone]  If 7PM-7AM, please contact night-coverage [email or web address] Password [email or web address] 10/28/2013, 4:01 PM   LOS: 2 days

## 2013-10-29 DIAGNOSIS — R609 Edema, unspecified: Secondary | ICD-10-CM

## 2013-10-29 LAB — LIPID PANEL
CHOL/HDL RATIO: 2.4 ratio
Cholesterol: 97 mg/dL (ref 0–200)
HDL: 40 mg/dL (ref >39)
LDL CALC: 38 mg/dL (ref 0–99)
Triglycerides: 96 mg/dL (ref <150)
VLDL: 19 mg/dL (ref 0–40)

## 2013-10-29 LAB — GLUCOSE, CAPILLARY
Glucose-Capillary: 191 mg/dL — ABNORMAL HIGH (ref 70–99)
Glucose-Capillary: 194 mg/dL — ABNORMAL HIGH (ref 70–99)
Glucose-Capillary: 262 mg/dL — ABNORMAL HIGH (ref 70–99)
Glucose-Capillary: 184 mg/dL — ABNORMAL HIGH (ref 70–99)

## 2013-10-29 LAB — CBC
HEMATOCRIT: 39.1 % (ref 39.0–52.0)
HEMOGLOBIN: 13.5 g/dL (ref 13.0–17.0)
MCH: 31.3 pg (ref 26.0–34.0)
MCHC: 34.5 g/dL (ref 30.0–36.0)
MCV: 90.5 fL (ref 78.0–100.0)
Platelets: 371 10*3/uL (ref 150–400)
RBC: 4.32 MIL/uL (ref 4.22–5.81)
RDW: 13.1 % (ref 11.5–15.5)
WBC: 13.7 10*3/uL — AB (ref 4.0–10.5)

## 2013-10-29 LAB — VANCOMYCIN, TROUGH: Vancomycin Tr: 9.6 ug/mL — ABNORMAL LOW (ref 10.0–20.0)

## 2013-10-29 MED ORDER — VANCOMYCIN HCL 10 G IV SOLR
1750.0000 mg | Freq: Two times a day (BID) | INTRAVENOUS | Status: DC
  Administered 2013-10-29 – 2013-10-30 (×3): 1750 mg via INTRAVENOUS
  Filled 2013-10-29 (×4): qty 1750

## 2013-10-29 MED ORDER — INSULIN ASPART 100 UNIT/ML ~~LOC~~ SOLN
7.0000 [IU] | Freq: Three times a day (TID) | SUBCUTANEOUS | Status: DC
  Administered 2013-10-29 – 2013-10-30 (×2): 7 [IU] via SUBCUTANEOUS

## 2013-10-29 MED ORDER — INSULIN DETEMIR 100 UNIT/ML ~~LOC~~ SOLN
45.0000 [IU] | Freq: Every day | SUBCUTANEOUS | Status: DC
Start: 2013-10-29 — End: 2013-10-30
  Administered 2013-10-29: 45 [IU] via SUBCUTANEOUS
  Filled 2013-10-29 (×2): qty 0.45

## 2013-10-29 NOTE — Progress Notes (Signed)
ANTIBIOTIC CONSULT NOTE - FOLLOW UP  Pharmacy Consult for Vancomycin  Indication: Cellulitis  Allergies  Allergen Reactions  . Penicillins Other (See Comments)    Unknown allergy - childhood reaction    Patient Measurements: Height: 6' (182.9 cm) Weight: 397 lb 9.6 oz (180.35 kg) IBW/kg (Calculated) : 77.6  Vital Signs: Temp: 98.8 F (37.1 C) (02/21 2232) Temp src: Oral (02/21 2232) BP: 135/56 mmHg (02/21 2232) Pulse Rate: 76 (02/21 2232) Intake/Output from previous day: 02/21 0701 - 02/22 0700 In: 860 [P.O.:360; IV Piggyback:500] Out: 2275 [Urine:2275] Intake/Output from this shift: Total I/O In: -  Out: 1250 [Urine:1250]  Labs:  Recent Labs  10/26/13 1232 10/27/13 0416 10/28/13 0355  WBC 9.0 7.9 9.2  HGB 12.7* 11.9* 11.9*  PLT 239 233 271  CREATININE 0.79 0.85 0.88   Estimated Creatinine Clearance: 153.6 ml/min (by C-G formula based on Cr of 0.88).  Recent Labs  10/28/13 2223  VANCOTROUGH 9.6*     Microbiology: No results found for this or any previous visit (from the past 720 hour(s)).  Anti-infectives   Start     Dose/Rate Route Frequency Ordered Stop   10/27/13 0000  vancomycin (VANCOCIN) 1,500 mg in sodium chloride 0.9 % 500 mL IVPB     1,500 mg 250 mL/hr over 120 Minutes Intravenous Every 12 hours 10/26/13 1247     10/26/13 1300  vancomycin (VANCOCIN) 2,000 mg in sodium chloride 0.9 % 500 mL IVPB     2,000 mg 250 mL/hr over 120 Minutes Intravenous  Once 10/26/13 1247 10/26/13 1820      Assessment: 58 y/o M on vancomycin for cellulitis. VT this AM is 9.6 on vancomycin 1500 mg IV q12h. Other labs as above.   Goal of Therapy:  Vancomycin trough level 10-15 mcg/ml  Plan:  -Increase vancomycin to 1750 mg IV q12h -Repeat VT as indicated   Narda Bonds 10/29/2013,12:43 AM

## 2013-10-29 NOTE — Progress Notes (Signed)
Pt placed own CPAP prior to RT arrival.  Pt resting comfortably on home unit.  21% FiO2.

## 2013-10-29 NOTE — Progress Notes (Signed)
VASCULAR LAB PRELIMINARY  PRELIMINARY  PRELIMINARY  PRELIMINARY  Left lower extremity venous Doppler completed.    Preliminary report:  There is no obvious evidence of DVT or SVT noted in the left lower extremity.  Massai Hankerson, RVT 10/29/2013, 4:32 PM

## 2013-10-29 NOTE — Progress Notes (Signed)
[facility] PROGRESS NOTE  Gerald Day:[id number] DOB: 1955/11/20 DOA: 10/26/2013 PCP: Cathlean Cower, MD  Assessment/Plan: Cellulitis right lower extremity  -Failed outpatient oral antibiotics  -continue intravenous vancomycin  -Venous Doppler right lower extremity--neg for DVT  -MR right leg due to increase WBC -duplex left leg -check cpk Diabetes mellitus type 2, uncontrolled  -10/23/2013 hemoglobin A1c 9.8  -I had a long discussion with the patient regarding the risks, benefits, and alternatives of starting insulin--he is very amenable to starting insulin during this admission  -Consulted diabetic educator to provide further education  -Increase Levemir to 45 units at bedtime  -Pre-meal NovoLog 7 units with each meal  -Continue NovoLog sliding scale  -LDL--38  Chronic CHF  -Appears compensated  -Continue home dose of furosemide  Hypertension  -Continue home medications including amlodipine/olmesartan  Hyperlipidemia  -Continue statin  Alcohol dependence  -The patient drinks 2-3 beers daily for numerous years  -no signs of withdraw Family Communication: wife at beside  Disposition Plan: Home when medically stable  Antibiotics:  Vancomycin 10/26/13>>>          Procedures/Studies:  No results found.      Subjective: Patient denies fevers, chills, chest discomfort, shortness breath, nausea, vomiting, diarrhea, abdominal pain. He feels that his right leg is improving.  Objective: Filed Vitals:   10/28/13 1724 10/28/13 2232 10/29/13 0522 10/29/13 0926  BP: 156/71 135/56 128/63 154/78  Pulse: 92 76 74 95  Temp: 98.2 F (36.8 C) 98.8 F (37.1 C) 98.3 F (36.8 C) 98 F (36.7 C)  TempSrc: Oral Oral Oral Oral  Resp: 22 20 20 20   Height:      Weight:      SpO2: 99% 99% 96% 97%    Intake/Output Summary (Last 24 hours) at 10/29/13 1301 Last data filed at 10/29/13 1200  Gross per 24 hour  Intake    720 ml  Output   3750 ml  Net  -3030 ml    Weight change:  Exam:   General:  Pt is alert, follows commands appropriately, not in acute distress  HEENT: No icterus, No thrush,  [place]/AT  Cardiovascular: RRR, S1/S2, no rubs, no gallops  Respiratory: CTA bilaterally, no wheezing, no crackles, no rhonchi  Abdomen: Soft/+BS, non tender, non distended, no guarding  Extremities: 2+ LE edema, No lymphangitis, No petechiae, No rashes, no synovitis; erythema extending from the patient's right ankle to infrapatellar area without any crepitance.  Data Reviewed: Basic Metabolic Panel:  Recent Labs Lab 10/23/13 1002 10/26/13 1232 10/27/13 0416 10/28/13 0355  NA 132* 134* 137 135*  K 4.3 4.4 4.3 4.0  CL 97 95* 99 96  CO2 27 27 26 28   GLUCOSE 319* 340* 274* 228*  BUN 12 10 11 10   CREATININE 0.9 0.79 0.85 0.88  CALCIUM 8.7 9.0 8.4 8.4   Liver Function Tests:  Recent Labs Lab 10/26/13 1232  AST 31  ALT 60*  ALKPHOS 87  BILITOT 0.5  PROT 7.8  ALBUMIN 2.8*   No results found for this basename: LIPASE, AMYLASE,  in the last 168 hours No results found for this basename: AMMONIA,  in the last 168 hours CBC:  Recent Labs Lab 10/23/13 1002 10/26/13 1232 10/27/13 0416 10/28/13 0355 10/29/13 0840  WBC 10.1 9.0 7.9 9.2 13.7*  NEUTROABS 8.5* 6.9  --   --   --   HGB 13.5 12.7* 11.9* 11.9* 13.5  HCT 40.8 37.0* 34.4* 34.9* 39.1  MCV 91.1 89.8 89.6 89.5 90.5  PLT  152.0 239 233 271 371   Cardiac Enzymes: No results found for this basename: CKTOTAL, CKMB, CKMBINDEX, TROPONINI,  in the last 168 hours BNP: No components found with this basename: POCBNP,  CBG:  Recent Labs Lab 10/28/13 0759 10/28/13 1148 10/28/13 1710 10/28/13 2005 10/29/13 0833  GLUCAP 204* 190* 213* 185* 191*    No results found for this or any previous visit (from the past 240 hour(s)).   Scheduled Meds: . amLODipine  10 mg Oral Daily   And  . irbesartan  300 mg Oral Daily  . aspirin EC  81 mg Oral Daily  . atorvastatin  10 mg Oral q1800   . enoxaparin (LOVENOX) injection  80 mg Subcutaneous Q24H  . furosemide  80 mg Oral BID  . insulin aspart  0-20 Units Subcutaneous TID WC  . insulin aspart  0-5 Units Subcutaneous QHS  . insulin aspart  5 Units Subcutaneous TID WC  . insulin detemir  42 Units Subcutaneous QHS  . linagliptin  5 mg Oral Daily  . potassium chloride SA  20 mEq Oral BID  . vancomycin  1,750 mg Intravenous Q12H   Continuous Infusions:    Saia Derossett, DO  [facility] Pager [phone]  If 7PM-7AM, please contact night-coverage [email or web address] Password [email or web address] 10/29/2013, 1:01 PM   LOS: 3 days

## 2013-10-30 LAB — BASIC METABOLIC PANEL
BUN: 10 mg/dL (ref 6–23)
CALCIUM: 8.9 mg/dL (ref 8.4–10.5)
CO2: 30 mEq/L (ref 19–32)
CREATININE: 0.86 mg/dL (ref 0.50–1.35)
Chloride: 98 mEq/L (ref 96–112)
GFR calc non Af Amer: >90 mL/min (ref >90)
Glucose, Bld: 191 mg/dL — ABNORMAL HIGH (ref 70–99)
Potassium: 4.5 mEq/L (ref 3.7–5.3)
Sodium: 138 mEq/L (ref 137–147)

## 2013-10-30 LAB — CBC
HEMATOCRIT: 37.1 % — AB (ref 39.0–52.0)
Hemoglobin: 12.6 g/dL — ABNORMAL LOW (ref 13.0–17.0)
MCH: 30.5 pg (ref 26.0–34.0)
MCHC: 34 g/dL (ref 30.0–36.0)
MCV: 89.8 fL (ref 78.0–100.0)
Platelets: 311 10*3/uL (ref 150–400)
RBC: 4.13 MIL/uL — ABNORMAL LOW (ref 4.22–5.81)
RDW: 13 % (ref 11.5–15.5)
WBC: 9.8 10*3/uL (ref 4.0–10.5)

## 2013-10-30 LAB — GLUCOSE, CAPILLARY
Glucose-Capillary: 180 mg/dL — ABNORMAL HIGH (ref 70–99)
Glucose-Capillary: 191 mg/dL — ABNORMAL HIGH (ref 70–99)

## 2013-10-30 LAB — CK: Total CK: 36 U/L (ref 7–232)

## 2013-10-30 MED ORDER — INSULIN ASPART 100 UNIT/ML FLEXPEN: 8.0000 [IU] | PEN_INJECTOR | Freq: Three times a day (TID) | SUBCUTANEOUS | Status: DC

## 2013-10-30 MED ORDER — INSULIN DETEMIR 100 UNIT/ML FLEXPEN: 50.0000 [IU] | PEN_INJECTOR | Freq: Every day | SUBCUTANEOUS | Status: DC

## 2013-10-30 MED ORDER — POTASSIUM CHLORIDE CRYS ER 20 MEQ PO TBCR: 20.0000 meq | EXTENDED_RELEASE_TABLET | Freq: Every day | ORAL | Status: DC

## 2013-10-30 MED ORDER — INSULIN PEN NEEDLE 32G X 4 MM MISC: Status: DC

## 2013-10-30 MED ORDER — UNABLE TO FIND: Status: DC

## 2013-10-30 MED ORDER — INSULIN DETEMIR 100 UNIT/ML ~~LOC~~ SOLN
50.0000 [IU] | Freq: Every day | SUBCUTANEOUS | Status: DC
  Filled 2013-10-30: qty 0.5

## 2013-10-30 MED ORDER — SULFAMETHOXAZOLE-TRIMETHOPRIM 800-160 MG PO TABS
2.0000 | ORAL_TABLET | Freq: Two times a day (BID) | ORAL | Status: DC
Start: 2013-10-30 — End: 2014-05-11

## 2013-10-30 MED ORDER — AMLODIPINE-OLMESARTAN 10-40 MG PO TABS: 1.0000 | ORAL_TABLET | Freq: Every day | ORAL | Status: DC

## 2013-10-30 MED ORDER — INSULIN ASPART 100 UNIT/ML ~~LOC~~ SOLN
8.0000 [IU] | Freq: Three times a day (TID) | SUBCUTANEOUS | Status: DC
  Administered 2013-10-30: 8 [IU] via SUBCUTANEOUS

## 2013-10-30 NOTE — Progress Notes (Signed)
Patient was discharged home with wife. Patient was given discharge instructions and information on prescriptions. Patient was given a home insulin starter kit and educated on diabetes and symptoms of hypo and hyperglycemia. Patient was told to contact the doctor if cellulitis does not improve or symptoms get worse. Patient was stable upon discharge.

## 2013-10-30 NOTE — Discharge Summary (Signed)
Physician Discharge Summary  Gerald Day:[id number] DOB: 10-15-1955 DOA: 10/26/2013  PCP: Cathlean Cower, MD  Admit date: 10/26/2013 Discharge date: 10/30/2013  Recommendations for Outpatient Follow-up:  1. Pt will need to follow up with PCP in 2 weeks post discharge 2. Please obtain BMP to evaluate electrolytes and kidney function 3. Please also check CBC to evaluate Hg and Hct levels  Discharge Diagnoses:  Cellulitis right lower extremity  -Failed outpatient oral antibiotics  -continue intravenous vancomycin  -Venous Doppler right lower extremity--neg for DVT  -MR right leg-was canceled due to clinical improvement -Elevated WBC likely due to flareup of the patient's gouty arthritis--the patient deferred any acute treatment at this time -check cpk--36  -The patient will go home on Bactrim DS, 2 tablets twice a day for 7 additional days which will complete 10 days of therapy -The patient was instructed to keep his right lower extremity elevated when he is recumbent Diabetes mellitus type 2, uncontrolled  -10/23/2013 hemoglobin A1c 9.8  -I had a long discussion with the patient regarding the risks, benefits, and alternatives of starting insulin--he is very amenable to starting insulin during this admission  -Consulted diabetic educator to provide further education  -Increase Levemir to 50 units at bedtime  -Pre-meal NovoLog 8 units with each meal  -Continue NovoLog sliding scale  -LDL--38  -During this hospitalization, the patient was seen by the diabetic educator. His insulin was adjusted. The patient will go home on basal bolus dosing. -Diabetic supplies including glucometer, test strips, lancets were provided.  -Patient was instructed to restart his metformin and Januvia at home.  Chronic CHF  -Appears compensated  -Continue home dose of furosemide  -The patient's potassium ranged in the 4.0-4.5 range -The patient will discontinue his previous dose of potassium  supplementation -He will go home only on 20 mEq once daily Hypertension  -Continue home medications including amlodipine/olmesartan  -The patient's Azor was increased to 10-40 mg once daily Hyperlipidemia  -Continue statin  Alcohol dependence  -The patient drinks 2-3 beers daily for numerous years  -no signs of withdraw  -Discussed the importance of cessation Family Communication: wife at beside  Disposition Plan: Home when medically stable  Antibiotics:  Vancomycin 10/26/13>>> 10/30/2013   Discharge Condition: Stable  Disposition: Discharge home  Diet: Carb modified  Wt Readings from Last 3 Encounters:  10/27/13 180.35 kg (397 lb 9.6 oz)  10/26/13 181.348 kg (399 lb 12.8 oz)  10/23/13 184.523 kg (406 lb 12.8 oz)    History of present illness:  58 year old male with a history of metabolic syndrome, chronic CHF, diverticulosis, hyperlipidemia, and obstructive sleep apnea presents with four-day history of worsening right lower extremity pain, erythema, edema. The patient saw his primary care provider on 10/23/2013. The patient was started on Bactrim DS, 1 tablet twice a day. He stated that the erythema started around his calf region on 10/22/2013. He denied any antecedent trauma or injury. He states that he does wear shoes anytime he walks outside the house. He denied any fevers, chills, chest discomfort, shortness breath, nausea, vomiting, diarrhea, abdominal pain, dysuria, hematuria. The patient endorses compliance with the Bactrim DS. He went back to see his primary care provider on the day of admission. There was no improvement in the patient's pain, erythema, edema. As a result, the patient was directly admitted for intravenous antibiotics. The patient states that he had cellulitis in his lower extremities approximately 10 years ago requiring intravenous vancomycin. The patient will start intravenous vancomycin. The patient experienced clinical  improvement with progression of his  erythema. Edema of his right lower extremity continued to be a problem.  This problem was due to the patient's venous stasis. The patient was instructed to keep his right upper extremity elevated. The patient was started on basal bolus dosing for his insulin. Diabetic educator saw the patient and provided education. He is agreeable to go home on Levemir and NovoLog dosing. The patient was instructed to keep a glycemic log and take his primary care provider who will adjust his insulin regimen in the future. Alternatively optimize, the patient's antihypertensive regimen was adjusted during the hospitalization. Again, the patient will follow up with his primary care provider for further adjustments.     Discharge Exam: Filed Vitals:   10/30/13 0810  BP: 165/73  Pulse: 89  Temp: 98.1 F (36.7 C)  Resp: 20   Filed Vitals:   10/29/13 1743 10/29/13 2154 10/30/13 0449 10/30/13 0810  BP: 136/81 151/60 136/53 165/73  Pulse: 95 82 73 89  Temp: 98 F (36.7 C) 99.2 F (37.3 C) 98.8 F (37.1 C) 98.1 F (36.7 C)  TempSrc: Oral Oral Oral Oral  Resp: 18 18 20 20   Height:      Weight:      SpO2: 99% 97% 99% 95%   General: A&O x 3, NAD, pleasant, cooperative Cardiovascular: RRR, no rub, no gallop, no S3 Respiratory: CTAB, no wheeze, no rhonchi Abdomen:soft, nontender, nondistended, positive bowel sounds Extremities:  No lymphangitis, no petechiae; right lower extremity erythema extending from the patient's prepatellar area to the ankle without any crepitance, or necrosis there  Discharge Instructions      Discharge Orders   Future Appointments Provider Department Dept Phone   01/11/2014 1:00 PM Biagio Borg, MD [place] [phone]   Future Orders Complete By Expires   Diet - low sodium heart healthy  As directed    Discharge instructions  As directed    Comments:     Stop Azor 5-40mg . Start Azor 10-40mg , one pill daily Restart your allopurinol on  11/05/13 Continue taking your metformin and Celesta Gentile Stop previous doses of your potassium Take potassium 35mEQ, once daily   Increase activity slowly  As directed        Medication List    STOP taking these medications       AZOR 5-40 MG per tablet  Generic drug:  amLODipine-olmesartan  Replaced by:  amLODipine-olmesartan 10-40 MG per tablet      TAKE these medications       amLODipine-olmesartan 10-40 MG per tablet  Commonly known as:  AZOR  Take 1 tablet by mouth daily.     aspirin EC 81 MG tablet  Take 81 mg by mouth daily as needed (headache).     atorvastatin 10 MG tablet  Commonly known as:  LIPITOR  Take 10 mg by mouth daily.     furosemide 80 MG tablet  Commonly known as:  LASIX  Take 80 mg by mouth daily.     insulin aspart 100 UNIT/ML FlexPen  Commonly known as:  NOVOLOG  Inject 8 Units into the skin 3 (three) times daily with meals.     Insulin Detemir 100 UNIT/ML Pen  Commonly known as:  LEVEMIR  Inject 50 Units into the skin daily at 10 pm.     Insulin Pen Needle 32G X 4 MM Misc  Use with novolog and levemir pens to dispense insulin as directed     metFORMIN 500 MG 24  hr tablet  Commonly known as:  GLUCOPHAGE-XR  Take 2,000 mg by mouth daily with breakfast.     potassium chloride SA 20 MEQ tablet  Commonly known as:  K-DUR,KLOR-CON  Take 1 tablet (20 mEq total) by mouth daily.     sitaGLIPtin 100 MG tablet  Commonly known as:  JANUVIA  Take 1 tablet (100 mg total) by mouth daily.     sulfamethoxazole-trimethoprim 800-160 MG per tablet  Commonly known as:  SEPTRA DS  Take 2 tablets by mouth 2 (two) times daily.     UNABLE TO FIND  Mr. Buda was hospitalized from 10/26/13 until 10/30/13.  He will be medically stable to return to work on 11/06/13         The results of significant diagnostics from this hospitalization (including imaging, microbiology, ancillary and laboratory) are listed below for reference.    Significant Diagnostic  Studies: No results found.   Microbiology: No results found for this or any previous visit (from the past 240 hour(s)).   Labs: Basic Metabolic Panel:  Recent Labs Lab 10/23/13 1002 10/26/13 1232 10/27/13 0416 10/28/13 0355 10/30/13 0650  NA 132* 134* 137 135* 138  K 4.3 4.4 4.3 4.0 4.5  CL 97 95* 99 96 98  CO2 27 27 26 28 30   GLUCOSE 319* 340* 274* 228* 191*  BUN 12 10 11 10 10   CREATININE 0.9 0.79 0.85 0.88 0.86  CALCIUM 8.7 9.0 8.4 8.4 8.9   Liver Function Tests:  Recent Labs Lab 10/26/13 1232  AST 31  ALT 60*  ALKPHOS 87  BILITOT 0.5  PROT 7.8  ALBUMIN 2.8*   No results found for this basename: LIPASE, AMYLASE,  in the last 168 hours No results found for this basename: AMMONIA,  in the last 168 hours CBC:  Recent Labs Lab 10/23/13 1002 10/26/13 1232 10/27/13 0416 10/28/13 0355 10/29/13 0840 10/30/13 0650  WBC 10.1 9.0 7.9 9.2 13.7* 9.8  NEUTROABS 8.5* 6.9  --   --   --   --   HGB 13.5 12.7* 11.9* 11.9* 13.5 12.6*  HCT 40.8 37.0* 34.4* 34.9* 39.1 37.1*  MCV 91.1 89.8 89.6 89.5 90.5 89.8  PLT 152.0 239 233 271 371 311   Cardiac Enzymes:  Recent Labs Lab 10/30/13 0650  CKTOTAL 36   BNP: No components found with this basename: POCBNP,  CBG:  Recent Labs Lab 10/29/13 0833 10/29/13 1213 10/29/13 1714 10/29/13 2012 10/30/13 0805  GLUCAP 191* 194* 262* 184* 180*    Time coordinating discharge:  Greater than 30 minutes  Signed:  Pola Furno, DO [facility] Pager: [phone] 10/30/2013, 10:00 AM

## 2013-10-30 NOTE — Plan of Care (Signed)
Problem: Food- and Nutrition-Related Knowledge Deficit (NB-1.1) Goal: Nutrition education Formal process to instruct or train a patient/client in a skill or to impart knowledge to help patients/clients voluntarily manage or modify food choices and eating behavior to maintain or improve health. Outcome: Completed/Met Date Met:  10/30/13  RD consulted for nutrition education regarding diabetes.     Lab Results  Component Value Date    HGBA1C 9.8* 10/23/2013    RD provided "Carbohydrate Counting for People with Diabetes" handout from the Academy of [employer] Dietetics. Discussed different food groups and their effects on blood sugar, emphasizing carbohydrate-containing foods. Provided list of carbohydrates and recommended serving sizes of common foods.  Discussed importance of controlled and consistent carbohydrate intake throughout the day. Provided examples of ways to balance meals/snacks and encouraged intake of high-fiber, whole grain complex carbohydrates. Teach back method used.  Expect fair compliance.  Body mass index is 53.91 kg/(m^2). Pt meets criteria for Obese Class III based on current BMI.  Current diet order is Carbohydrate Modified Medium, patient is consuming approximately 90% of meals at this time. Labs and medications reviewed. No further nutrition interventions warranted at this time. RD contact information provided. If additional nutrition issues arise, please re-consult RD.  Inda Coke MS, RD, LDN Inpatient Registered Dietitian Pager: [phone] After-hours pager: [phone]

## 2013-11-01 ENCOUNTER — Encounter: Payer: Self-pay | Admitting: Internal Medicine

## 2013-11-01 ENCOUNTER — Ambulatory Visit (INDEPENDENT_AMBULATORY_CARE_PROVIDER_SITE_OTHER): Payer: BC Managed Care – PPO | Admitting: Internal Medicine

## 2013-11-01 ENCOUNTER — Other Ambulatory Visit (INDEPENDENT_AMBULATORY_CARE_PROVIDER_SITE_OTHER): Payer: BC Managed Care – PPO

## 2013-11-01 VITALS — BP 132/80 | HR 101 | Temp 97.5°F | Ht 72.0 in | Wt 388.0 lb

## 2013-11-01 DIAGNOSIS — Z0000 Encounter for general adult medical examination without abnormal findings: Secondary | ICD-10-CM

## 2013-11-01 DIAGNOSIS — E119 Type 2 diabetes mellitus without complications: Secondary | ICD-10-CM

## 2013-11-01 DIAGNOSIS — L02419 Cutaneous abscess of limb, unspecified: Secondary | ICD-10-CM

## 2013-11-01 DIAGNOSIS — I10 Essential (primary) hypertension: Secondary | ICD-10-CM

## 2013-11-01 DIAGNOSIS — L03119 Cellulitis of unspecified part of limb: Principal | ICD-10-CM

## 2013-11-01 LAB — HEPATIC FUNCTION PANEL
ALT: 54 U/L — ABNORMAL HIGH (ref 0–53)
AST: 33 U/L (ref 0–37)
Albumin: 2.7 g/dL — ABNORMAL LOW (ref 3.5–5.2)
Alkaline Phosphatase: 74 U/L (ref 39–117)
BILIRUBIN DIRECT: 0.2 mg/dL (ref 0.0–0.3)
BILIRUBIN TOTAL: 0.7 mg/dL (ref 0.3–1.2)
Total Protein: 8 g/dL (ref 6.0–8.3)

## 2013-11-01 LAB — CBC WITH DIFFERENTIAL/PLATELET
BASOS ABS: 0 10*3/uL (ref 0.0–0.1)
Basophils Relative: 0.4 % (ref 0.0–3.0)
EOS PCT: 1.9 % (ref 0.0–5.0)
Eosinophils Absolute: 0.2 10*3/uL (ref 0.0–0.7)
HCT: 41 % (ref 39.0–52.0)
Hemoglobin: 13.4 g/dL (ref 13.0–17.0)
Lymphocytes Relative: 13 % (ref 12.0–46.0)
Lymphs Abs: 1.5 10*3/uL (ref 0.7–4.0)
MCHC: 32.7 g/dL (ref 30.0–36.0)
MCV: 91.4 fl (ref 78.0–100.0)
Monocytes Absolute: 1 10*3/uL (ref 0.1–1.0)
Monocytes Relative: 8.4 % (ref 3.0–12.0)
Neutro Abs: 8.7 10*3/uL — ABNORMAL HIGH (ref 1.4–7.7)
Neutrophils Relative %: 76.3 % (ref 43.0–77.0)
Platelets: 445 10*3/uL — ABNORMAL HIGH (ref 150.0–400.0)
RBC: 4.49 Mil/uL (ref 4.22–5.81)
RDW: 13.2 % (ref 11.5–14.6)
WBC: 11.4 10*3/uL — AB (ref 4.5–10.5)

## 2013-11-01 LAB — URINALYSIS, ROUTINE W REFLEX MICROSCOPIC
Ketones, ur: NEGATIVE
LEUKOCYTES UA: NEGATIVE
Nitrite: NEGATIVE
Total Protein, Urine: 100 — AB
UROBILINOGEN UA: 0.2 (ref 0.0–1.0)
Urine Glucose: NEGATIVE
pH: 6 (ref 5.0–8.0)

## 2013-11-01 LAB — BASIC METABOLIC PANEL
BUN: 9 mg/dL (ref 6–23)
CO2: 25 mEq/L (ref 19–32)
CREATININE: 1 mg/dL (ref 0.4–1.5)
Calcium: 9.2 mg/dL (ref 8.4–10.5)
Chloride: 97 mEq/L (ref 96–112)
GFR: 86.28 mL/min (ref >60.00)
Glucose, Bld: 180 mg/dL — ABNORMAL HIGH (ref 70–99)
POTASSIUM: 4.5 meq/L (ref 3.5–5.1)
Sodium: 132 mEq/L — ABNORMAL LOW (ref 135–145)

## 2013-11-01 LAB — LIPID PANEL
CHOL/HDL RATIO: 3
Cholesterol: 101 mg/dL (ref 0–200)
HDL: 32.2 mg/dL — ABNORMAL LOW (ref >39.00)
LDL CALC: 45 mg/dL (ref 0–99)
Triglycerides: 121 mg/dL (ref 0.0–149.0)
VLDL: 24.2 mg/dL (ref 0.0–40.0)

## 2013-11-01 LAB — PSA: PSA: 0.44 ng/mL (ref 0.10–4.00)

## 2013-11-01 LAB — TSH: TSH: 2.57 u[IU]/mL (ref 0.35–5.50)

## 2013-11-01 LAB — MICROALBUMIN / CREATININE URINE RATIO
CREATININE, U: 303.6 mg/dL
MICROALB UR: 41.3 mg/dL — AB (ref 0.0–1.9)
Microalb Creat Ratio: 13.6 mg/g (ref 0.0–30.0)

## 2013-11-01 LAB — HEMOGLOBIN A1C: Hgb A1c MFr Bld: 10.1 % — ABNORMAL HIGH (ref 4.6–6.5)

## 2013-11-01 MED ORDER — GLUCOSE BLOOD VI STRP: ORAL_STRIP | Status: DC

## 2013-11-01 NOTE — Assessment & Plan Note (Signed)
Slow improvement, to finish antibx, f/u any owrsening fever, red/sweling/tender

## 2013-11-01 NOTE — Assessment & Plan Note (Signed)
stable overall by history and exam, recent data reviewed with pt, and pt to continue medical treatment as before,  to f/u any worsening symptoms or concerns BP Readings from Last 3 Encounters:  11/01/13 132/80  10/30/13 112/48  10/26/13 132/82

## 2013-11-01 NOTE — Patient Instructions (Signed)
Please continue all other medications as before - to finish the antibiotic Refills of the strips has been done Please have the pharmacy call with any other refills you may need. Check your blood sugars at least once per day, usually in the AM before bfast, but also sometimes later in the day before lunch or dinner or bedtime  Please remember to sign up for MyChart if you have not done so, as this will be important to you in the future with finding out test results, communicating by private email, and scheduling acute appointments online when needed.  Please return in May 2015, or sooner if needed, with Lab testing done 3-5 days before

## 2013-11-01 NOTE — Progress Notes (Signed)
Pre visit review using our clinic review tool, if applicable. No additional management support is needed unless otherwise documented below in the visit note. 

## 2013-11-01 NOTE — Progress Notes (Signed)
Subjective:    Patient ID: Gerald Day, male    DOB: [birth year], 58 y.o.   MRN: [id number]  HPI here to f/u, overall doing ok, except RLE still warm and some painful/tender most distal aspect above the ankle, but at least can walk, no fever, and feels overall improved; today not ready and not planning to return to work until [place], concerned about his ST disability and getting the dates right if cant go back on Monday mar 2. No overt bleeding or bruising, was rec'd for f/u cbc and bmp per hospitalist.  Acute right first MTP area improved as well with recent gout attack, with now decreasd red/swelling.   Pt denies fever, wt loss, night sweats, loss of appetite, or other constitutional symptoms. Pt denies chest pain, increased sob or doe, wheezing, orthopnea, PND, increased LE swelling, palpitations, dizziness or syncope.   Pt denies polydipsia, polyuria, but was found to have severe uncontrolled overall sugar with a1c 9.8.  Now on insulin and metformin, no questinos, finished Dm education while hospd, but not yet checking cbg's as had no rx for strips. Past Medical History  Diagnosis Date  . LOW BACK PAIN   . CELLULITIS/ABSCESS, LEG 06/27/2007  . ERECTILE DYSFUNCTION, ORGANIC   . DIVERTICULOSIS, COLON   . CONGESTIVE HEART FAILURE   . HYPERTENSION   . Morbid obesity   . GOUT NOS   . HYPERLIPIDEMIA   . COLONIC POLYPS   . SLEEP APNEA, OBSTRUCTIVE     USES CPAP  . DIABETES MELLITUS, TYPE II   . Arthritis     IN KNEES  . Cellulitis 10/26/2013    LOWER RT EXTREMITY   Past Surgical History  Procedure Laterality Date  . Appendectomy    . Foot surgery      LT    reports that he has never smoked. He has never used smokeless tobacco. He reports that he drinks about 1.2 ounces of alcohol per week. He reports that he does not use illicit drugs. family history includes Asthma in his other; Cardiomyopathy in his father. Allergies  Allergen Reactions  . Penicillins Other (See Comments)   Unknown allergy - childhood reaction   Current Outpatient Prescriptions on File Prior to Visit  Medication Sig Dispense Refill  . amLODipine-olmesartan (AZOR) 10-40 MG per tablet Take 1 tablet by mouth daily.  30 tablet  1  . aspirin EC 81 MG tablet Take 81 mg by mouth daily as needed (headache).       Marland Kitchen atorvastatin (LIPITOR) 10 MG tablet Take 10 mg by mouth daily.      . furosemide (LASIX) 80 MG tablet Take 80 mg by mouth daily.      . insulin aspart (NOVOLOG) 100 UNIT/ML FlexPen Inject 8 Units into the skin 3 (three) times daily with meals.  15 mL  1  . Insulin Detemir (LEVEMIR) 100 UNIT/ML Pen Inject 50 Units into the skin daily at 10 pm.  15 mL  1  . Insulin Pen Needle 32G X 4 MM MISC Use with novolog and levemir pens to dispense insulin as directed  100 each  11  . metFORMIN (GLUCOPHAGE-XR) 500 MG 24 hr tablet Take 2,000 mg by mouth daily with breakfast.      . potassium chloride SA (K-DUR,KLOR-CON) 20 MEQ tablet Take 1 tablet (20 mEq total) by mouth daily.  30 tablet  0  . sitaGLIPtin (JANUVIA) 100 MG tablet Take 1 tablet (100 mg total) by mouth daily.  90 tablet  1  . sulfamethoxazole-trimethoprim (SEPTRA DS) 800-160 MG per tablet Take 2 tablets by mouth 2 (two) times daily.  28 tablet  0  . UNABLE TO FIND Mr. Armitage was hospitalized from 10/26/13 until 10/30/13.  He will be medically stable to return to work on 11/06/13  1 Act  0   No current facility-administered medications on file prior to visit.   Review of Systems  Constitutional: Negative for unexpected weight change, or unusual diaphoresis  HENT: Negative for tinnitus.   Eyes: Negative for photophobia and visual disturbance.  Respiratory: Negative for choking and stridor.   Gastrointestinal: Negative for vomiting and blood in stool.  Genitourinary: Negative for hematuria and decreased urine volume.  Musculoskeletal: Negative for acute joint swelling Skin: Negative for color change and wound.  Neurological: Negative for  tremors and numbness other than noted  Psychiatric/Behavioral: Negative for decreased concentration or  hyperactivity.       Objective:   Physical Exam BP 132/80  Pulse 101  Temp(Src) 97.5 F (36.4 C) (Oral)  Ht 6' (1.829 m)  Wt 388 lb (175.996 kg)  BMI 52.61 kg/m2  SpO2 95% VS noted,  Constitutional: Pt appears well-developed and well-nourished.  HENT: Head: NCAT.  Right Ear: External ear normal.  Left Ear: External ear normal.  Eyes: Conjunctivae and EOM are normal. Pupils are equal, round, and reactive to light.  Neck: Normal range of motion. Neck supple.  Cardiovascular: Normal rate and regular rhythm.   Pulmonary/Chest: Effort normal and breath sounds normal.  Abd:  Soft, NT, non-distended, + BS Neurological: Pt is alert. Not confused  Skin: RLE with 2+ edema to knee, warm/red/tender area noted above ankle aprox 3 cm area anteriorly, and 2 cm area posteriorly, without drainage or weeping, no ulcers Psychiatric: Pt behavior is normal. Thought content normal.      Assessment & Plan:

## 2013-11-01 NOTE — Assessment & Plan Note (Signed)
stable overall by history and exam, recent data reviewed with pt, and pt to continue medical treatment as before,  to f/u any worsening symptoms or concerns Lab Results  Component Value Date   HGBA1C 9.8* 10/23/2013   For cont same tx, f/u may 2015

## 2013-11-07 DIAGNOSIS — Z0279 Encounter for issue of other medical certificate: Secondary | ICD-10-CM

## 2013-11-29 ENCOUNTER — Other Ambulatory Visit: Payer: Self-pay | Admitting: Internal Medicine

## 2013-11-30 ENCOUNTER — Telehealth: Payer: Self-pay

## 2013-11-30 NOTE — Telephone Encounter (Signed)
Patient called [facility] requesting a call back about refills. Returned call to pt, no answer/vm, per chart medication metformin received and approved yesterday.

## 2014-01-08 ENCOUNTER — Other Ambulatory Visit: Payer: Self-pay | Admitting: Internal Medicine

## 2014-01-11 ENCOUNTER — Encounter: Payer: BC Managed Care – PPO | Admitting: Internal Medicine

## 2014-03-21 ENCOUNTER — Telehealth: Payer: Self-pay

## 2014-03-21 NOTE — Telephone Encounter (Signed)
Message left with wife to call back to schedule an a1c check.

## 2014-05-08 ENCOUNTER — Telehealth: Payer: Self-pay

## 2014-05-08 ENCOUNTER — Other Ambulatory Visit (INDEPENDENT_AMBULATORY_CARE_PROVIDER_SITE_OTHER): Payer: BC Managed Care – PPO

## 2014-05-08 ENCOUNTER — Ambulatory Visit: Payer: BC Managed Care – PPO

## 2014-05-08 ENCOUNTER — Encounter: Payer: Self-pay | Admitting: Internal Medicine

## 2014-05-08 DIAGNOSIS — Z0000 Encounter for general adult medical examination without abnormal findings: Secondary | ICD-10-CM

## 2014-05-08 DIAGNOSIS — R7309 Other abnormal glucose: Secondary | ICD-10-CM

## 2014-05-08 LAB — PSA: PSA: 0.33 ng/mL (ref 0.10–4.00)

## 2014-05-08 LAB — CBC WITH DIFFERENTIAL/PLATELET
Basophils Absolute: 0 10*3/uL (ref 0.0–0.1)
Basophils Relative: 0.2 % (ref 0.0–3.0)
Eosinophils Absolute: 0.2 10*3/uL (ref 0.0–0.7)
Eosinophils Relative: 3.4 % (ref 0.0–5.0)
HEMATOCRIT: 41 % (ref 39.0–52.0)
HEMOGLOBIN: 14.1 g/dL (ref 13.0–17.0)
LYMPHS ABS: 1.1 10*3/uL (ref 0.7–4.0)
Lymphocytes Relative: 16.2 % (ref 12.0–46.0)
MCHC: 34.4 g/dL (ref 30.0–36.0)
MCV: 92.3 fl (ref 78.0–100.0)
MONO ABS: 0.8 10*3/uL (ref 0.1–1.0)
MONOS PCT: 10.7 % (ref 3.0–12.0)
NEUTROS ABS: 4.9 10*3/uL (ref 1.4–7.7)
Neutrophils Relative %: 69.5 % (ref 43.0–77.0)
PLATELETS: 183 10*3/uL (ref 150.0–400.0)
RBC: 4.44 Mil/uL (ref 4.22–5.81)
RDW: 14 % (ref 11.5–15.5)
WBC: 7.1 10*3/uL (ref 4.0–10.5)

## 2014-05-08 LAB — HEPATIC FUNCTION PANEL
ALBUMIN: 3.9 g/dL (ref 3.5–5.2)
ALK PHOS: 52 U/L (ref 39–117)
ALT: 23 U/L (ref 0–53)
AST: 18 U/L (ref 0–37)
Bilirubin, Direct: 0.1 mg/dL (ref 0.0–0.3)
TOTAL PROTEIN: 7.7 g/dL (ref 6.0–8.3)
Total Bilirubin: 0.9 mg/dL (ref 0.2–1.2)

## 2014-05-08 LAB — URINALYSIS, ROUTINE W REFLEX MICROSCOPIC
Bilirubin Urine: NEGATIVE
Ketones, ur: NEGATIVE
Leukocytes, UA: NEGATIVE
Nitrite: NEGATIVE
SPECIFIC GRAVITY, URINE: 1.02 (ref 1.000–1.030)
Total Protein, Urine: 100 — AB
UROBILINOGEN UA: 0.2 (ref 0.0–1.0)
Urine Glucose: NEGATIVE
WBC UA: NONE SEEN (ref 0–?)
pH: 5.5 (ref 5.0–8.0)

## 2014-05-08 LAB — HEMOGLOBIN A1C: HEMOGLOBIN A1C: 7.3 % — AB (ref 4.6–6.5)

## 2014-05-08 LAB — LIPID PANEL
CHOL/HDL RATIO: 4
Cholesterol: 168 mg/dL (ref 0–200)
HDL: 42.8 mg/dL (ref >39.00)
NonHDL: 125.2
TRIGLYCERIDES: 215 mg/dL — AB (ref 0.0–149.0)
VLDL: 43 mg/dL — ABNORMAL HIGH (ref 0.0–40.0)

## 2014-05-08 LAB — BASIC METABOLIC PANEL
BUN: 22 mg/dL (ref 6–23)
CALCIUM: 9.3 mg/dL (ref 8.4–10.5)
CO2: 29 meq/L (ref 19–32)
CREATININE: 1.1 mg/dL (ref 0.4–1.5)
Chloride: 102 mEq/L (ref 96–112)
GFR: 75.08 mL/min (ref >60.00)
Glucose, Bld: 189 mg/dL — ABNORMAL HIGH (ref 70–99)
Potassium: 4 mEq/L (ref 3.5–5.1)
SODIUM: 140 meq/L (ref 135–145)

## 2014-05-08 LAB — TSH: TSH: 2.8 u[IU]/mL (ref 0.35–4.50)

## 2014-05-08 LAB — LDL CHOLESTEROL, DIRECT: Direct LDL: 107.6 mg/dL

## 2014-05-08 NOTE — Telephone Encounter (Signed)
cpx labs entered  

## 2014-05-11 ENCOUNTER — Encounter: Payer: Self-pay | Admitting: Internal Medicine

## 2014-05-11 ENCOUNTER — Ambulatory Visit (INDEPENDENT_AMBULATORY_CARE_PROVIDER_SITE_OTHER): Payer: BC Managed Care – PPO | Admitting: Internal Medicine

## 2014-05-11 VITALS — BP 132/72 | HR 83 | Temp 98.1°F | Ht 72.0 in | Wt 393.5 lb

## 2014-05-11 DIAGNOSIS — E119 Type 2 diabetes mellitus without complications: Secondary | ICD-10-CM

## 2014-05-11 DIAGNOSIS — I10 Essential (primary) hypertension: Secondary | ICD-10-CM

## 2014-05-11 DIAGNOSIS — Z0000 Encounter for general adult medical examination without abnormal findings: Secondary | ICD-10-CM

## 2014-05-11 MED ORDER — IRBESARTAN 300 MG PO TABS: 300.0000 mg | ORAL_TABLET | Freq: Every day | ORAL | Status: DC

## 2014-05-11 MED ORDER — GLUCOSE BLOOD VI STRP: ORAL_STRIP | Status: DC

## 2014-05-11 MED ORDER — AMLODIPINE BESYLATE 10 MG PO TABS: 10.0000 mg | ORAL_TABLET | Freq: Every day | ORAL | Status: DC

## 2014-05-11 NOTE — Progress Notes (Signed)
Subjective:    Patient ID: Gerald Day, male    DOB: 01/06/1956, 58 y.o.   MRN: [id number]  HPI  Here for wellness and f/u;  Overall doing ok;  Pt denies CP, worsening SOB, DOE, wheezing, orthopnea, PND, worsening LE edema, palpitations, dizziness or syncope.  Pt denies neurological change such as new headache, facial or extremity weakness.  Pt denies polydipsia, polyuria, or low sugar symptoms. Pt states overall good compliance with treatment and medications, good tolerability, and has been trying to follow lower cholesterol diet.  Pt denies worsening depressive symptoms, suicidal ideation or panic. No fever, night sweats, wt loss, loss of appetite, or other constitutional symptoms.  Pt states good ability with ADL's, has low fall risk, home safety reviewed and adequate, no other significant changes in hearing or vision, and more occasionally active with exercise now that he is living in apartment with pool.  Declines flu shot. No longer taking the insulin or januvia, really working on diet and activity.  Azor is too expensive now Past Medical History  Diagnosis Date  . LOW BACK PAIN   . CELLULITIS/ABSCESS, LEG 06/27/2007  . ERECTILE DYSFUNCTION, ORGANIC   . DIVERTICULOSIS, COLON   . CONGESTIVE HEART FAILURE   . HYPERTENSION   . Morbid obesity   . GOUT NOS   . HYPERLIPIDEMIA   . COLONIC POLYPS   . SLEEP APNEA, OBSTRUCTIVE     USES CPAP  . DIABETES MELLITUS, TYPE II   . Arthritis     IN KNEES  . Cellulitis 10/26/2013    LOWER RT EXTREMITY   Past Surgical History  Procedure Laterality Date  . Appendectomy    . Foot surgery      LT    reports that he has never smoked. He has never used smokeless tobacco. He reports that he drinks about 1.2 ounces of alcohol per week. He reports that he does not use illicit drugs. family history includes Asthma in his other; Cardiomyopathy in his father. Allergies  Allergen Reactions  . Penicillins Other (See Comments)    Unknown allergy -  childhood reaction   Current Outpatient Prescriptions on File Prior to Visit  Medication Sig Dispense Refill  . amLODipine-olmesartan (AZOR) 10-40 MG per tablet Take 1 tablet by mouth daily.  30 tablet  1  . aspirin EC 81 MG tablet Take 81 mg by mouth daily as needed (headache).       Marland Kitchen atorvastatin (LIPITOR) 10 MG tablet Take 10 mg by mouth daily.      . furosemide (LASIX) 80 MG tablet TAKE 1 TABLET TWICE A DAY  60 tablet  11  . insulin aspart (NOVOLOG) 100 UNIT/ML FlexPen Inject 8 Units into the skin 3 (three) times daily with meals.  15 mL  1  . Insulin Detemir (LEVEMIR) 100 UNIT/ML Pen Inject 50 Units into the skin daily at 10 pm.  15 mL  1  . Insulin Pen Needle 32G X 4 MM MISC Use with novolog and levemir pens to dispense insulin as directed  100 each  11  . metFORMIN (GLUCOPHAGE-XR) 500 MG 24 hr tablet Take 2,000 mg by mouth daily with breakfast.      . metFORMIN (GLUCOPHAGE-XR) 500 MG 24 hr tablet TAKE 4 TABLETS BY MOUTH PER DAY  360 tablet  3  . potassium chloride SA (K-DUR,KLOR-CON) 20 MEQ tablet Take 1 tablet (20 mEq total) by mouth daily.  30 tablet  0  . sitaGLIPtin (JANUVIA) 100 MG tablet Take 1  tablet (100 mg total) by mouth daily.  90 tablet  1  . sulfamethoxazole-trimethoprim (SEPTRA DS) 800-160 MG per tablet Take 2 tablets by mouth 2 (two) times daily.  28 tablet  0  . UNABLE TO FIND Mr. Luber was hospitalized from 10/26/13 until 10/30/13.  He will be medically stable to return to work on 11/06/13  1 Act  0   No current facility-administered medications on file prior to visit.    Review of Systems Constitutional: Negative for increased diaphoresis, other activity, appetite or other siginficant weight change  HENT: Negative for worsening hearing loss, ear pain, facial swelling, mouth sores and neck stiffness.   Eyes: Negative for other worsening pain, redness or visual disturbance.  Respiratory: Negative for shortness of breath and wheezing.   Cardiovascular: Negative for  chest pain and palpitations.  Gastrointestinal: Negative for diarrhea, blood in stool, abdominal distention or other pain Genitourinary: Negative for hematuria, flank pain or change in urine volume.  Musculoskeletal: Negative for myalgias or other joint complaints.  Skin: Negative for color change and wound.  Neurological: Negative for syncope and numbness. other than noted Hematological: Negative for adenopathy. or other swelling Psychiatric/Behavioral: Negative for hallucinations, self-injury, decreased concentration or other worsening agitation.      Objective:   Physical Exam BP 132/72  Pulse 83  Temp(Src) 98.1 F (36.7 C) (Oral)  Ht 6' (1.829 m)  Wt 393 lb 8 oz (178.49 kg)  BMI 53.36 kg/m2  SpO2 96% VS noted,  Constitutional: Pt is oriented to person, place, and time. Appears well-developed and well-nourished.  Head: Normocephalic and atraumatic.  Right Ear: External ear normal.  Left Ear: External ear normal.  Nose: Nose normal.  Mouth/Throat: Oropharynx is clear and moist.  Eyes: Conjunctivae and EOM are normal. Pupils are equal, round, and reactive to light.  Neck: Normal range of motion. Neck supple. No JVD present. No tracheal deviation present.  Cardiovascular: Normal rate, regular rhythm, normal heart sounds and intact distal pulses.   Pulmonary/Chest: Effort normal and breath sounds without rales or wheezing  Abdominal: Soft. Bowel sounds are normal. NT. No HSM  Musculoskeletal: Normal range of motion. Exhibits no edema.  Lymphadenopathy:  Has no cervical adenopathy.  Neurological: Pt is alert and oriented to person, place, and time. Pt has normal reflexes. No cranial nerve deficit. Motor grossly intact Skin: Skin is warm and dry. No rash noted.  Psychiatric:  Has normal mood and affect. Behavior is normal.      Assessment & Plan:

## 2014-05-11 NOTE — Patient Instructions (Addendum)
OK to stop the azor  Please take all new medication as prescribed - the amlodipine 10 mg, and the generic Avapro 300 mg instead  Please continue all other medications as before, and refills have been done if requested.  Please have the pharmacy call with any other refills you may need.  Please continue your efforts at being more active, low cholesterol diet, and weight control.  You are otherwise up to date with prevention measures today.  Please keep your appointments with your specialists as you may have planned  Please return in 6 months, or sooner if needed, with Lab testing done 3-5 days before

## 2014-05-11 NOTE — Progress Notes (Signed)
Pre visit review using our clinic review tool, if applicable. No additional management support is needed unless otherwise documented below in the visit note. 

## 2014-05-12 NOTE — Assessment & Plan Note (Signed)
stable overall by history and exam, recent data reviewed with pt, and pt to continue medical treatment as before,  to f/u any worsening symptoms or concerns Lab Results  Component Value Date   HGBA1C 7.3* 05/08/2014

## 2014-05-12 NOTE — Assessment & Plan Note (Addendum)
stable overall by history and exam, recent data reviewed with pt, and pt to continue medical treatment as before except chang azor to amlodipine/avapro,  to f/u any worsening symptoms or concerns BP Readings from Last 3 Encounters:  05/11/14 132/72  11/01/13 132/80  10/30/13 112/48

## 2014-05-12 NOTE — Assessment & Plan Note (Signed)

## 2014-05-22 ENCOUNTER — Other Ambulatory Visit: Payer: Self-pay | Admitting: Podiatry

## 2014-05-28 ENCOUNTER — Telehealth: Payer: Self-pay | Admitting: Internal Medicine

## 2014-05-28 NOTE — Telephone Encounter (Signed)
Pt called in looking for all medications.  He stated they were suppose to be be sent to pharmacy [facility] on [address] .  He said he check several times.    Thank You!

## 2014-05-28 NOTE — Telephone Encounter (Signed)
Called pt inform him md had already sent over his medication on 05/11/14. Pt stated he think [facility] address will contact them again...Gerald Day

## 2014-07-19 ENCOUNTER — Other Ambulatory Visit: Payer: Self-pay | Admitting: Internal Medicine

## 2014-09-05 ENCOUNTER — Institutional Professional Consult (permissible substitution): Payer: BC Managed Care – PPO | Admitting: Pulmonary Disease

## 2014-09-14 ENCOUNTER — Institutional Professional Consult (permissible substitution): Payer: BC Managed Care – PPO | Admitting: Pulmonary Disease

## 2014-10-08 ENCOUNTER — Encounter: Payer: Self-pay | Admitting: Internal Medicine

## 2014-10-08 ENCOUNTER — Ambulatory Visit (INDEPENDENT_AMBULATORY_CARE_PROVIDER_SITE_OTHER): Payer: BLUE CROSS/BLUE SHIELD | Admitting: Internal Medicine

## 2014-10-08 VITALS — BP 152/86 | HR 85 | Temp 98.3°F | Ht 72.0 in | Wt 391.5 lb

## 2014-10-08 DIAGNOSIS — H6983 Other specified disorders of Eustachian tube, bilateral: Secondary | ICD-10-CM

## 2014-10-08 DIAGNOSIS — J0100 Acute maxillary sinusitis, unspecified: Secondary | ICD-10-CM

## 2014-10-08 DIAGNOSIS — J310 Chronic rhinitis: Secondary | ICD-10-CM

## 2014-10-08 DIAGNOSIS — H6993 Unspecified Eustachian tube disorder, bilateral: Secondary | ICD-10-CM

## 2014-10-08 MED ORDER — SULFAMETHOXAZOLE-TRIMETHOPRIM 800-160 MG PO TABS: 1.0000 | ORAL_TABLET | Freq: Two times a day (BID) | ORAL | Status: DC

## 2014-10-08 NOTE — Progress Notes (Signed)
Pre visit review using our clinic review tool, if applicable. No additional management support is needed unless otherwise documented below in the visit note. 

## 2014-10-08 NOTE — Progress Notes (Signed)
   Subjective:    Patient ID: Gerald Day, male    DOB: 1955/12/08, 59 y.o.   MRN: [id number]  HPI  Symptoms began as loss of hearing greater on the left than the right as of 10/04/14. This was associated with pressure symptoms and slight tinnitus.  The last 3 days he describes pain in the right maxillary sinus with yellow nasal discharge. His also coughed up some yellow material; the volume is greater from the head than from the chest.  He does use CPAP and uses Afrin nasal spray at night.  He has no other upper respiratory tract infection or extrinsic symptoms.  Review of Systems Frontal headache, L facial pain , dental pain, sore throat , otic pain or otic discharge denied. No fever , chills or sweats.Extrinsic symptoms of itchy, watery eyes, sneezing, or angioedema are denied. There is no associated  wheezing,or  paroxysmal nocturnal dyspnea.    Objective:   Physical Exam  Positive or pertinent findings include:  He has a full beard and mustache. There is erythema of the superior aspect of each tympanic membrane. Inferiorly the tympanic membranes are dull.  The right nasal septum is erythematous. He has deep creases in the earlobes. Abdomen is massive.  General appearance:Adequately nourished; no acute distress or increased work of breathing is present.  No  lymphadenopathy about the head, neck, or axilla noted.  Eyes: No conjunctival inflammation or lid edema is present. There is no scleral icterus. Nose:  External nasal examination shows no deformity or inflammation. No septal dislocation or deviation.No obstruction to airflow.  Oral exam: Dental hygiene is good; lips and gums are healthy appearing.There is no oropharyngeal erythema or exudate noted.  Neck:  No deformities, thyromegaly, masses, or tenderness noted.  Heart:  Normal rate and regular rhythm. S1 and S2 normal without gallop, murmur, click, rub or other extra sounds. Lungs:Chest clear to auscultation; no  wheezes, rhonchi,rales ,or rubs present. Extremities:  No cyanosis, edema, or clubbing  noted  Skin: Warm & dry w/o jaundice or tenting.      Assessment & Plan:  #1 eustachian tube dysfunction #2 R maxillary sinusitis #3 non allergic rhinitis  Plan: as per protocol in [facility]S

## 2014-10-08 NOTE — Patient Instructions (Addendum)
  Nasal cleansing in the shower as discussed with lather of mild shampoo.After 10 seconds wash off lather while  exhaling through nostrils. Make sure that all residual soap is removed to prevent irritation.  Flonase OR Nasacort AQ 1 spray in each nostril twice a day as needed. Use the "crossover" technique into opposite nostril spraying toward opposite ear @ 45 degree angle, not straight up into nostril.  Plain Allegra (NOT D )  160 daily , Loratidine 10 mg , OR Zyrtec 10 mg @ bedtime  as needed for itchy eyes & sneezing.   Drink thin  fluids liberally through the day and chew sugarless gum . Do the Valsalva maneuver several times a day to "pop" ears open.

## 2014-10-29 ENCOUNTER — Ambulatory Visit (INDEPENDENT_AMBULATORY_CARE_PROVIDER_SITE_OTHER): Payer: BLUE CROSS/BLUE SHIELD | Admitting: Pulmonary Disease

## 2014-10-29 ENCOUNTER — Encounter: Payer: Self-pay | Admitting: Pulmonary Disease

## 2014-10-29 VITALS — BP 118/62 | HR 60 | Temp 97.0°F | Ht 72.0 in | Wt 395.2 lb

## 2014-10-29 DIAGNOSIS — G4733 Obstructive sleep apnea (adult) (pediatric): Secondary | ICD-10-CM

## 2014-10-29 NOTE — Assessment & Plan Note (Signed)
The patient has a history of very severe obstructive sleep apnea, and has done very well with C Pap over the years. His current device is having issues with the electronics, and probably needs to be either replaced or repaired. The patient feels that he has done well with his C Pap device and is satisfied with his sleep and daytime alertness. I have encouraged him to keep working on weight loss, and to keep up with his mask changes and supplies. I also reminded him that I need to see him on a yearly basis.

## 2014-10-29 NOTE — Progress Notes (Signed)
Subjective:    Patient ID: Gerald Day, male    DOB: Aug 07, 1956, 59 y.o.   MRN: [id number]  HPI The patient is a 59 year old male who comes in today to reestablish for management of obstructive sleep apnea. He was first diagnosed in 2002 with very severe OSA, with an AHI of 92 events per hour. He was started on C Pap and did well with his device, and did not follow-up again until 2012. He got a new machine at that time, and I have not seen him since. The patient currently is compliant with his device and feels that overall he sleeps very well. He denies any daytime alertness issues, and his Epworth is only 5 today. He feels rested in the mornings upon arising. His weight is actually down 45 pounds since his last visit in 2012. He has kept up with his mask changes and supplies on a regular basis. His only issue at this time is related to his device. He has a hard time getting his on/off switch to work properly, and his device will often cut off by itself in the middle of the night.  Sleep Questionnaire What time do you typically go to bed?( Between what hours) 9:30p 9:30p at 1524 on 10/29/14 by Inge Rise, CMA How long does it take you to fall asleep? few min few min at 1524 on 10/29/14 by Inge Rise, CMA How many times during the night do you wake up? 0 0 at 1524 on 10/29/14 by Inge Rise, CMA What time do you get out of bed to start your day? 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 Yes Yes at 1524 on 10/29/14 by Inge Rise, CMA How much has your weight changed (up or down) over the past two years? (In pounds) 25 lb (11.34 kg) 25 lb (11.34 kg) at 1524 on 10/29/14 by Inge Rise, CMA Have you ever had a sleep study before? Yes Yes at 1524 on 10/29/14 by Inge Rise, CMA If yes, [facility] of [facility] at 1524 on 10/29/14 by Inge Rise, CMA If yes, date of study? 2002 2002 at 1524 on  10/29/14 by Inge Rise, CMA Do you currently use CPAP? Yes Yes at 1524 on 10/29/14 by Inge Rise, CMA If so, what pressure? not sure not sure at 1524 on 10/29/14 by Inge Rise, CMA Do you wear oxygen at any time? No No at 1524 on 10/29/14 by Inge Rise, CMA   Review of Systems  Constitutional: Negative for fever and unexpected weight change.  HENT: Negative for congestion, dental problem, ear pain, nosebleeds, postnasal drip, rhinorrhea, sinus pressure, sneezing, sore throat and trouble swallowing.   Eyes: Negative for redness and itching.  Respiratory: Negative for cough, chest tightness, shortness of breath and wheezing.   Cardiovascular: Positive for leg swelling. Negative for palpitations.  Gastrointestinal: Negative for nausea and vomiting.  Genitourinary: Negative for dysuria.  Musculoskeletal: Negative for joint swelling.  Skin: Negative for rash.  Neurological: Negative for headaches.  Hematological: Does not bruise/bleed easily.  Psychiatric/Behavioral: Negative for dysphoric mood. The patient is not nervous/anxious.        Objective:   Physical Exam Constitutional:  Morbidly obese male, no acute distress  HENT:  Nares patent without discharge, septal deviation to the left with narrowing  Oropharynx without exudate, palate and uvula are thick and elongated.  Eyes:  Perrla, eomi, no scleral icterus  Neck:  No JVD, no TMG  Cardiovascular:  Normal rate, regular rhythm, no rubs or gallops.  No murmurs        Intact distal pulses but very diminished  Pulmonary :  Normal breath sounds, no stridor or respiratory distress   No rales, rhonchi, or wheezing  Abdominal:  Soft, nondistended, bowel sounds present.  No tenderness noted.   Musculoskeletal:  3+ lower extremity edema noted.  Lymph Nodes:  No cervical lymphadenopathy noted  Skin:  No cyanosis noted  Neurologic:  Alert, appropriate, moves all 4 extremities without obvious deficit.           Assessment & Plan:

## 2014-10-29 NOTE — Patient Instructions (Signed)
Will have your cpap machine either repaired or replaced. Keep up with mask changes and supplies Work on weight loss followup with me again in one year.

## 2014-11-13 ENCOUNTER — Ambulatory Visit: Payer: BC Managed Care – PPO | Admitting: Internal Medicine

## 2014-11-29 ENCOUNTER — Other Ambulatory Visit: Payer: Self-pay | Admitting: Internal Medicine

## 2015-01-16 ENCOUNTER — Encounter: Payer: Self-pay | Admitting: Internal Medicine

## 2015-01-16 ENCOUNTER — Ambulatory Visit (INDEPENDENT_AMBULATORY_CARE_PROVIDER_SITE_OTHER): Payer: BLUE CROSS/BLUE SHIELD | Admitting: Internal Medicine

## 2015-01-16 VITALS — BP 138/80 | HR 59 | Temp 97.8°F | Resp 18 | Ht 72.0 in | Wt 395.1 lb

## 2015-01-16 DIAGNOSIS — Z0000 Encounter for general adult medical examination without abnormal findings: Secondary | ICD-10-CM

## 2015-01-16 DIAGNOSIS — Z01818 Encounter for other preprocedural examination: Secondary | ICD-10-CM | POA: Diagnosis not present

## 2015-01-16 DIAGNOSIS — E119 Type 2 diabetes mellitus without complications: Secondary | ICD-10-CM | POA: Diagnosis not present

## 2015-01-16 NOTE — Assessment & Plan Note (Signed)
Ok for right great toe procedure as planned

## 2015-01-16 NOTE — Patient Instructions (Signed)
Please continue all other medications as before, and refills have been done if requested.  Please have the pharmacy call with any other refills you may need.  Please continue your efforts at being more active, low cholesterol diet, and weight control.  You are otherwise up to date with prevention measures today.  Please keep your appointments with your specialists as you may have planned  Please forward another copy of the Preop Surg[facility] clearance form for [place] to fill out

## 2015-01-16 NOTE — Assessment & Plan Note (Signed)

## 2015-01-16 NOTE — Progress Notes (Signed)
Subjective:    Patient ID: Gerald Day, male    DOB: 04-14-1956, 59 y.o.   MRN: [id number]  HPI  Here for wellness and f/u;  Overall doing ok;  Pt denies Chest pain, worsening SOB, DOE, wheezing, orthopnea, PND, worsening LE edema, palpitations, dizziness or syncope.  Pt denies neurological change such as new headache, facial or extremity weakness.  Pt denies polydipsia, polyuria, or low sugar symptoms. Pt states overall good compliance with treatment and medications, good tolerability, and has been trying to follow appropriate diet.  Pt denies worsening depressive symptoms, suicidal ideation or panic. No fever, night sweats, wt loss, loss of appetite, or other constitutional symptoms.  Pt states good ability with ADL's, has low fall risk, home safety reviewed and adequate, no other significant changes in hearing or vision, and only occasionally active with exercise. Wt Readings from Last 3 Encounters:  01/16/15 395 lb 1.3 oz (179.207 kg)  10/29/14 395 lb 3.2 oz (179.262 kg)  10/08/14 391 lb 8 oz (177.583 kg)  Had labs done with recent podiatry appt including a1c per pt, does not feel he needs labs now. Soon for skin grafting to right toe per podiatry  Not interested in gastric bypass Past Medical History  Diagnosis Date  . LOW BACK PAIN   . CELLULITIS/ABSCESS, LEG 06/27/2007  . ERECTILE DYSFUNCTION, ORGANIC   . DIVERTICULOSIS, COLON   . CONGESTIVE HEART FAILURE   . HYPERTENSION   . Morbid obesity   . GOUT NOS   . HYPERLIPIDEMIA   . COLONIC POLYPS   . SLEEP APNEA, OBSTRUCTIVE     USES CPAP  . DIABETES MELLITUS, TYPE II   . Arthritis     IN KNEES  . Cellulitis 10/26/2013    LOWER RT EXTREMITY   Past Surgical History  Procedure Laterality Date  . Appendectomy    . Foot surgery      LT    reports that he has never smoked. He has never used smokeless tobacco. He reports that he drinks about 1.2 oz of alcohol per week. He reports that he does not use illicit drugs. family  history includes Asthma in his other; Cardiomyopathy in his father. Allergies  Allergen Reactions  . Penicillins Other (See Comments)    Unknown allergy - childhood reaction   Current Outpatient Prescriptions on File Prior to Visit  Medication Sig Dispense Refill  . amLODipine (NORVASC) 10 MG tablet Take 1 tablet (10 mg total) by mouth daily. 90 tablet 3  . aspirin EC 81 MG tablet Take 81 mg by mouth daily as needed (headache).     Marland Kitchen atorvastatin (LIPITOR) 10 MG tablet Take 10 mg by mouth daily.    . AZOPT 1 % ophthalmic suspension 1 drop into both eyes twice daily  99  . COMBIGAN 0.2-0.5 % ophthalmic solution 1 drop in the right eye 2 times daily  99  . furosemide (LASIX) 80 MG tablet TAKE 1 TABLET TWICE A DAY 60 tablet 11  . glucose blood (ONE TOUCH ULTRA TEST) test strip Use as directed once daily to check blood sugar.  Diagnosis code 250.02 100 each 11  . irbesartan (AVAPRO) 300 MG tablet Take 1 tablet (300 mg total) by mouth daily. 90 tablet 3  . LUMIGAN 0.01 % SOLN 1 drop in both eyes at bedtime  99  . metFORMIN (GLUCOPHAGE-XR) 500 MG 24 hr tablet Take 2,000 mg by mouth daily with breakfast.    . metFORMIN (GLUCOPHAGE-XR) 500 MG 24 hr tablet  TAKE 4 TABLETS BY MOUTH PER DAY 360 tablet 3  . potassium chloride SA (K-DUR,KLOR-CON) 20 MEQ tablet Take 1 tablet (20 mEq total) by mouth daily. 30 tablet 0   No current facility-administered medications on file prior to visit.     Review of Systems Constitutional: Negative for increased diaphoresis, other activity, appetite or siginficant weight change other than noted HENT: Negative for worsening hearing loss, ear pain, facial swelling, mouth sores and neck stiffness.   Eyes: Negative for other worsening pain, redness or visual disturbance.  Respiratory: Negative for shortness of breath and wheezing  Cardiovascular: Negative for chest pain and palpitations.  Gastrointestinal: Negative for diarrhea, blood in stool, abdominal distention or  other pain Genitourinary: Negative for hematuria, flank pain or change in urine volume.  Musculoskeletal: Negative for myalgias or other joint complaints.  Skin: Negative for color change and wound or drainage.  Neurological: Negative for syncope and numbness. other than noted Hematological: Negative for adenopathy. or other swelling Psychiatric/Behavioral: Negative for hallucinations, SI, self-injury, decreased concentration or other worsening agitation.      Objective:   Physical Exam BP 138/80 mmHg  Pulse 59  Temp(Src) 97.8 F (36.6 C) (Oral)  Resp 18  Ht 6' (1.829 m)  Wt 395 lb 1.3 oz (179.207 kg)  BMI 53.57 kg/m2  SpO2 96% VS noted,  Constitutional: Pt is oriented to person, place, and time. Appears well-developed and well-nourished, in no significant distress/ morbid obese Head: Normocephalic and atraumatic.  Right Ear: External ear normal.  Left Ear: External ear normal.  Nose: Nose normal.  Mouth/Throat: Oropharynx is clear and moist.  Eyes: Conjunctivae and EOM are normal. Pupils are equal, round, and reactive to light.  Neck: Normal range of motion. Neck supple. No JVD present. No tracheal deviation present or significant neck LA or mass Cardiovascular: Normal rate, regular rhythm, normal heart sounds and intact distal pulses.   Pulmonary/Chest: Effort normal and breath sounds without rales or wheezing  Abdominal: Soft. Bowel sounds are normal. NT. No HSM  Musculoskeletal: Normal range of motion. Exhibits no edema.  Lymphadenopathy:  Has no cervical adenopathy.  Neurological: Pt is alert and oriented to person, place, and time. Pt has normal reflexes. No cranial nerve deficit. Motor grossly intact Skin: Skin is warm and dry. No rash noted.  Psychiatric:  Has normal mood and affect. Behavior is normal.     Assessment & Plan:

## 2015-01-16 NOTE — Assessment & Plan Note (Signed)
Assuming a1c < 7.5 - 8, ok for surgury as planned

## 2015-03-08 ENCOUNTER — Encounter (HOSPITAL_COMMUNITY): Payer: Self-pay

## 2015-03-08 ENCOUNTER — Encounter (HOSPITAL_COMMUNITY)
Admission: RE | Admit: 2015-03-08 | Discharge: 2015-03-08 | Disposition: A | Discharge status: Home / Self Care | Payer: BLUE CROSS/BLUE SHIELD | Source: Ambulatory Visit | Attending: Podiatry | Admitting: Podiatry

## 2015-03-08 DIAGNOSIS — I10 Essential (primary) hypertension: Secondary | ICD-10-CM | POA: Insufficient documentation

## 2015-03-08 DIAGNOSIS — Z01810 Encounter for preprocedural cardiovascular examination: Secondary | ICD-10-CM | POA: Diagnosis not present

## 2015-03-08 DIAGNOSIS — L97519 Non-pressure chronic ulcer of other part of right foot with unspecified severity: Secondary | ICD-10-CM | POA: Diagnosis not present

## 2015-03-08 DIAGNOSIS — Z01812 Encounter for preprocedural laboratory examination: Secondary | ICD-10-CM | POA: Diagnosis not present

## 2015-03-08 HISTORY — DX: Headache, unspecified: R51.9

## 2015-03-08 HISTORY — DX: Headache: R51

## 2015-03-08 LAB — GLUCOSE, CAPILLARY: Glucose-Capillary: 151 mg/dL — ABNORMAL HIGH (ref 65–99)

## 2015-03-08 NOTE — Pre-Procedure Instructions (Signed)
    Gerald Day  03/08/2015      [id number] [place], [place] [address] [place] [address] Phone: [phone] Fax: [phone]  [id number] - [place], [place] [place] [address] Phone: [phone] Fax: [phone]    Your procedure is scheduled on 03/15/15.  Report to [facility] Admitting at 6 A.M.  Call this number if you have problems the morning of surgery:  [phone]   Remember:  Do not eat food or drink liquids after midnight.  Take these medicines the morning of surgery with A SIP OF WATER- norvasc,all eye drops   Do not wear jewelry, make-up or nail polish.  Do not wear lotions, powders, or perfumes.  You may wear deodorant.  Do not shave 48 hours prior to surgery.  Men may shave face and neck.  Do not bring valuables to the hospital.  [facility] is not responsible for any belongings or valuables.  Contacts, dentures or bridgework may not be worn into surgery.  Leave your suitcase in the car.  After surgery it may be brought to your room.  For patients admitted to the hospital, discharge time will be determined by your treatment team.  Patients discharged the day of surgery will not be allowed to drive home.   Name and phone number of your driver:    Special instructions:    Please read over the following fact sheets that you were given. Pain Booklet, Coughing and Deep Breathing and Surgical Site Infection Prevention

## 2015-03-08 NOTE — Pre-Procedure Instructions (Signed)
    Gerald Day  03/08/2015      [id number] - [place], [place] - [place] [place] [address] Phone: [phone] Fax: [phone]  [id number] - [place], [place]. [place] [address] Phone: [phone] Fax: [phone]    Your procedure is scheduled on 03/15/15.  Report to [facility] Admitting at 6 A.M.  Call this number if you have problems the morning of surgery:  [phone]   Remember:  Do not eat food or drink liquids after midnight.  Take these medicines the morning of surgery with A SIP OF WATER norvsac,all drops   Do not wear jewelry, make-up or nail polish.  Do not wear lotions, powders, or perfumes.  You may wear deodorant.  Do not shave 48 hours prior to surgery.  Men may shave face and neck.  Do not bring valuables to the hospital.  [facility] is not responsible for any belongings or valuables.  Contacts, dentures or bridgework may not be worn into surgery.  Leave your suitcase in the car.  After surgery it may be brought to your room.  For patients admitted to the hospital, discharge time will be determined by your treatment team.  Patients discharged the day of surgery will not be allowed to drive hom Special instructions:    Please read over the following fact sheets that you were given. Pain Booklet, Coughing and Deep Breathing and Surgical Site Infection Prevention

## 2015-03-08 NOTE — Pre-Procedure Instructions (Signed)
    Gerald Day  03/08/2015      [id number] - [place], [name] [address] [address] [address] Phone: [phone] Fax: [phone]  [id number] - [place], [place]. [name] [address] Phone: [phone] Fax: [phone]    Your procedure is scheduled on 03/15/15.  Report to [facility] Admitting at 6 A.M.  Call this number if you have problems the morning of surgery:  [phone]   Remember:  Do not eat food or drink liquids after midnight.  Take these medicines the morning of surgery with A SIP OF WATER norvsac,all drops   Do not wear jewelry, make-up or nail polish.  Do not wear lotions, powders, or perfumes.  You may wear deodorant.  Do not shave 48 hours prior to surgery.  Men may shave face and neck.  Do not bring valuables to the hospital.  [facility] is not responsible for any belongings or valuables.  Contacts, dentures or bridgework may not be worn into surgery.  Leave your suitcase in the car.  After surgery it may be brought to your room.  For patients admitted to the hospital, discharge time will be determined by your treatment team.  Patients discharged the day of surgery will not be allowed to drive hom Special instructions:    Please read over the following fact sheets that you were given. Pain Booklet, Coughing and Deep Breathing and Surgical Site Infection Prevention

## 2015-03-13 NOTE — H&P (Signed)
[facility] [place]  [place]  [address] Phone: [phone]  Fax: [phone]  Visit Note - Office Visit   Provider: Jed Limerick, DPM Encounter Date: Jan 01, 2015  Patient: Gerald Day, Gerald Day    ([id number]) Sex: Male       DOB: [birth year]      Age: 59 year        Race: White Address: [address]  [place]  [address] [address] [place]: [place]  Referred By:  Cathlean Cower  PCP INFORMATION: PRIMARY CARE PHYSICIAN INFORMATION: Cathlean Cower ([phone]) Most recent visit: Feb 05, 2014  Standard Superbill.  Complaint: Chief Complaint: follow up.  History of present illness: Location: Right great toe. Duration: 3 month(s). Character/Pain Level: 0 on a 10-point scale. Treatments: Amerigel. History positive for diabetes. NIDDM.  Blood Glucose: Fasting Glucose=165-184 mg/dl. spongy.  Current Medication: 1. Keflex 500 Mg Capsule  SIG: 1 po tid 2. Efudex 5% Cream  SIG: Apply to affected area twice a day 3. Lamisil 250 Mg Tablet  SIG: Take 1 tablet by mouth daily 4. Medrol 4 Mg Dosepak  SIG: Take per package instructions. 5. Lamisil 250 Mg Tablet  SIG: Take 1 tablet by mouth daily 6. Cipro 500 Mg Tablet  SIG: 1 POBID 7. Gentamicin 0.1% Cream  SIG: BID x6wks 8. Lamisil 250 Mg Tablet  SIG: Take 1 tablet by mouth daily 9. Furosemide 80 Mg Tablet (Other MD)  10. Meloxicam 15 Mg Tablet (Other MD)  11. Metformin Hcl 500 Mg Tablet (Other MD)   ROS: Integument: Negative for rashes, skin cancer, slow healing wounds, cellulitis. Musculoskeletal:  (+) for arthritis. Constitutional: No fever, chills, or nausea.  Medical History: Diabetes.  Arthritis.  Glaucoma.  High Blood Pressure.  Vascular grafts: None.  Joint implant(s): None.  Heart valve(s): None.  Chemotherapy: No.  Serious Injuries: None.  Family History/Social History: Diabetes: Father. Arthritis: Father.  Smoking History: Patient  denies tobacco use. Alcohol Use:  Patient drinks moderately.  INSULIN: Patient denies taking insulin. COUMADIN/PLAVIX USE: Patient denies taking coumadin/plavix. Occupation: [employer], [place].  Allergy: Penicillins  Examination: EXAM FINDINGS: 2 x 3 cm, 3 x 4 cm large ulceration on the right distal hallux with peeling skin and odor with a pinkish wound bed. No spreading cellulitis, mallet toe deformity of the right hallux. Severe neuropathy with minimal pain with walking. Assessment present for many months and has not responded to conservative care. He would like to proceed with surgery to attempt to heal this wound by addressing the mallet toe and then trying a skin graft to heal the ulceration.  Diagnosis: M24.571  [Achilles Contracture RIGHT]  M24.574  [Contracture, RIGHT foot]  L97.519  [Ulceration RIGHT Foot]   Prescription: 1. Norco 7.5-325 Tablet Mg  SIG: one or two every four to six hours as needed for pain  QTY: 60.00  PLAN: Clinical Summary Letter with office note for today's visit is available to patient through the online patient portal.  Established office visit to discuss etiology treatment and prognosis   Diagnosis: Contracted RIGHT Foot and Ulceration RIGHT Foot  EVALUATION & MANAGEMENT: Discussion: Discussed surgical options versus conservative care along with nature of procedure and post op course.  Prescribed medication(s), as listed above.  Surgical Consultation: Discussed surgical versus conservative treatment options. Risks versus benefits were discussed along with the nature of the procedure, post-operative course. Discussed possible complications, not limited to,  such as: slow-healing, infection, need for further surgery, chronic pain (RSDS), blood clots, bleeding problems, weakness, chronic swelling of the foot/digits, and arthritis. Rare but serious complications can even result in loss of digit, loss of limb, or loss of life. No  guarantees were given. Alternatives to surgery were discussed today.  PLANNED PROCEDURE(S). Right: Hammertoe Repair, tenotomy, CPT code 28010, Right first. Ulceration debridement and preparation for skin graft, CPT code 00712, application of A CELL skin graft, CPT code 19758, at [facility].   We will begin planning for scheduling the surgery based on patient's preference.  Return Visit: He is to return one week after surgery.  We should contact [facility] to make sure the skin graft is available for this case. Brand name is "ACELL" and needs to be at least 3 cm by 4 cm.  Venipuncture: Labs drawn for Surgical profile.  SUPPLIES:   PNEUMATIC CAM WALKER: Prescribed PNEUMATIC CAM WALKER for the RIGHT LOWER EXTREMITY. Indications: (Diagnosis: Achilles Contracture RIGHT, alleviate heel cord contracture, offload ulceration) . Patient will make a decision today based on cost and/or precertification. I believe these are necessary based on patient's condition and should help in the long term management of symptoms  DME PRESCRIPTIONS:    Rx: Pneumatic Cam Walker Disp: Right; Diagnosis: Contracted joint  . Sig: Use as directed while weightbearing. Can remove for range of motion and bathing. This has a rocker bottom to offload the foot during ambulation. Can be removed when nonweightbearing to perform range of motion exercises and keep muscles toned. This should reduce the risk of blood clots compared to a fiberglass cast. Patient instructed on the use of the device. .   This visit note has been electronically signed off by Jed Limerick, DPM on 01/02/2015 at 09:01 AM.

## 2015-03-15 ENCOUNTER — Ambulatory Visit (HOSPITAL_COMMUNITY): Payer: BLUE CROSS/BLUE SHIELD | Admitting: Anesthesiology

## 2015-03-15 ENCOUNTER — Encounter (HOSPITAL_COMMUNITY): Payer: Self-pay | Admitting: Registered Nurse

## 2015-03-15 ENCOUNTER — Encounter (HOSPITAL_COMMUNITY)
Admission: RE | Disposition: A | Discharge status: Home / Self Care | Payer: Self-pay | Source: Ambulatory Visit | Attending: Podiatry

## 2015-03-15 ENCOUNTER — Ambulatory Visit (HOSPITAL_COMMUNITY)
Admission: RE | Admit: 2015-03-15 | Discharge: 2015-03-15 | Disposition: A | Discharge status: Home / Self Care | Payer: BLUE CROSS/BLUE SHIELD | Source: Ambulatory Visit | Attending: Podiatry | Admitting: Podiatry

## 2015-03-15 DIAGNOSIS — I10 Essential (primary) hypertension: Secondary | ICD-10-CM | POA: Diagnosis not present

## 2015-03-15 DIAGNOSIS — M2041 Other hammer toe(s) (acquired), right foot: Secondary | ICD-10-CM | POA: Insufficient documentation

## 2015-03-15 DIAGNOSIS — H409 Unspecified glaucoma: Secondary | ICD-10-CM | POA: Diagnosis not present

## 2015-03-15 DIAGNOSIS — E119 Type 2 diabetes mellitus without complications: Secondary | ICD-10-CM | POA: Insufficient documentation

## 2015-03-15 DIAGNOSIS — Y929 Unspecified place or not applicable: Secondary | ICD-10-CM | POA: Diagnosis not present

## 2015-03-15 DIAGNOSIS — Y939 Activity, unspecified: Secondary | ICD-10-CM | POA: Diagnosis not present

## 2015-03-15 DIAGNOSIS — Z791 Long term (current) use of non-steroidal anti-inflammatories (NSAID): Secondary | ICD-10-CM | POA: Insufficient documentation

## 2015-03-15 DIAGNOSIS — M6701 Short Achilles tendon (acquired), right ankle: Secondary | ICD-10-CM | POA: Diagnosis not present

## 2015-03-15 DIAGNOSIS — X58XXXA Exposure to other specified factors, initial encounter: Secondary | ICD-10-CM | POA: Insufficient documentation

## 2015-03-15 DIAGNOSIS — Z9989 Dependence on other enabling machines and devices: Secondary | ICD-10-CM | POA: Insufficient documentation

## 2015-03-15 DIAGNOSIS — Z79899 Other long term (current) drug therapy: Secondary | ICD-10-CM | POA: Insufficient documentation

## 2015-03-15 DIAGNOSIS — Z7952 Long term (current) use of systemic steroids: Secondary | ICD-10-CM | POA: Diagnosis not present

## 2015-03-15 DIAGNOSIS — M1990 Unspecified osteoarthritis, unspecified site: Secondary | ICD-10-CM | POA: Insufficient documentation

## 2015-03-15 DIAGNOSIS — S91201A Unspecified open wound of right great toe with damage to nail, initial encounter: Secondary | ICD-10-CM | POA: Diagnosis not present

## 2015-03-15 DIAGNOSIS — L97519 Non-pressure chronic ulcer of other part of right foot with unspecified severity: Secondary | ICD-10-CM | POA: Diagnosis present

## 2015-03-15 DIAGNOSIS — Y999 Unspecified external cause status: Secondary | ICD-10-CM | POA: Diagnosis not present

## 2015-03-15 DIAGNOSIS — E11621 Type 2 diabetes mellitus with foot ulcer: Secondary | ICD-10-CM

## 2015-03-15 DIAGNOSIS — Z792 Long term (current) use of antibiotics: Secondary | ICD-10-CM | POA: Insufficient documentation

## 2015-03-15 DIAGNOSIS — G4730 Sleep apnea, unspecified: Secondary | ICD-10-CM | POA: Insufficient documentation

## 2015-03-15 HISTORY — PX: TENOTOMY / FLEXOR TENDON TRANSFER: SHX6629

## 2015-03-15 LAB — GLUCOSE, CAPILLARY
Glucose-Capillary: 223 mg/dL — ABNORMAL HIGH (ref 65–99)
Glucose-Capillary: 201 mg/dL — ABNORMAL HIGH (ref 65–99)

## 2015-03-15 SURGERY — TENOTOMY / FLEXOR TENDON TRANSFER
Anesthesia: General | Site: Foot | Laterality: Right

## 2015-03-15 MED ORDER — MIDAZOLAM HCL 2 MG/2ML IJ SOLN
INTRAMUSCULAR | Status: AC
  Filled 2015-03-15: qty 2

## 2015-03-15 MED ORDER — ROCURONIUM BROMIDE 50 MG/5ML IV SOLN
INTRAVENOUS | Status: AC
  Filled 2015-03-15: qty 1

## 2015-03-15 MED ORDER — LIDOCAINE-EPINEPHRINE 1 %-1:100000 IJ SOLN
INTRAMUSCULAR | Status: DC | PRN
  Administered 2015-03-15: 20 mL

## 2015-03-15 MED ORDER — LIDOCAINE HCL (CARDIAC) 20 MG/ML IV SOLN
INTRAVENOUS | Status: AC
  Filled 2015-03-15: qty 5

## 2015-03-15 MED ORDER — 0.9 % SODIUM CHLORIDE (POUR BTL) OPTIME
TOPICAL | Status: DC | PRN
  Administered 2015-03-15: 1000 mL

## 2015-03-15 MED ORDER — SODIUM CHLORIDE 0.9 % IR SOLN
Status: DC | PRN
  Administered 2015-03-15: 500 mL

## 2015-03-15 MED ORDER — ONDANSETRON HCL 4 MG/2ML IJ SOLN
INTRAMUSCULAR | Status: DC | PRN
  Administered 2015-03-15: 4 mg via INTRAVENOUS

## 2015-03-15 MED ORDER — PHENYLEPHRINE HCL 10 MG/ML IJ SOLN
INTRAMUSCULAR | Status: DC | PRN
  Administered 2015-03-15 (×2): 80 ug via INTRAVENOUS

## 2015-03-15 MED ORDER — MIDAZOLAM HCL 5 MG/5ML IJ SOLN
INTRAMUSCULAR | Status: DC | PRN
  Administered 2015-03-15: 2 mg via INTRAVENOUS

## 2015-03-15 MED ORDER — LIDOCAINE HCL (PF) 1 % IJ SOLN
INTRAMUSCULAR | Status: AC
  Filled 2015-03-15: qty 30

## 2015-03-15 MED ORDER — DEXAMETHASONE SODIUM PHOSPHATE 4 MG/ML IJ SOLN
INTRAMUSCULAR | Status: AC
  Filled 2015-03-15: qty 1

## 2015-03-15 MED ORDER — PROPOFOL 10 MG/ML IV BOLUS
INTRAVENOUS | Status: AC
  Filled 2015-03-15: qty 20

## 2015-03-15 MED ORDER — ONDANSETRON HCL 4 MG/2ML IJ SOLN: 4.0000 mg | Freq: Once | INTRAMUSCULAR | Status: DC | PRN

## 2015-03-15 MED ORDER — FENTANYL CITRATE (PF) 250 MCG/5ML IJ SOLN
INTRAMUSCULAR | Status: AC
  Filled 2015-03-15: qty 5

## 2015-03-15 MED ORDER — SUCCINYLCHOLINE CHLORIDE 20 MG/ML IJ SOLN
INTRAMUSCULAR | Status: DC | PRN
  Administered 2015-03-15: 120 mg via INTRAVENOUS

## 2015-03-15 MED ORDER — ONDANSETRON HCL 4 MG/2ML IJ SOLN
INTRAMUSCULAR | Status: AC
  Filled 2015-03-15: qty 2

## 2015-03-15 MED ORDER — SUCCINYLCHOLINE CHLORIDE 20 MG/ML IJ SOLN
INTRAMUSCULAR | Status: AC
  Filled 2015-03-15: qty 1

## 2015-03-15 MED ORDER — LIDOCAINE HCL (CARDIAC) 20 MG/ML IV SOLN
INTRAVENOUS | Status: DC | PRN
  Administered 2015-03-15: 100 mg via INTRAVENOUS

## 2015-03-15 MED ORDER — FENTANYL CITRATE (PF) 100 MCG/2ML IJ SOLN: 25.0000 ug | INTRAMUSCULAR | Status: DC | PRN

## 2015-03-15 MED ORDER — LACTATED RINGERS IV SOLN
INTRAVENOUS | Status: DC | PRN
Start: 2015-03-15 — End: 2015-03-15
  Administered 2015-03-15: 07:00:00 via INTRAVENOUS

## 2015-03-15 MED ORDER — CIPROFLOXACIN IN D5W 400 MG/200ML IV SOLN
400.0000 mg | INTRAVENOUS | Status: AC
  Administered 2015-03-15: 400 mg via INTRAVENOUS
  Filled 2015-03-15: qty 200

## 2015-03-15 MED ORDER — FENTANYL CITRATE (PF) 100 MCG/2ML IJ SOLN
INTRAMUSCULAR | Status: DC | PRN
  Administered 2015-03-15: 100 ug via INTRAVENOUS

## 2015-03-15 MED ORDER — LIDOCAINE-EPINEPHRINE 1 %-1:100000 IJ SOLN
INTRAMUSCULAR | Status: AC
  Filled 2015-03-15: qty 1

## 2015-03-15 MED ORDER — PROPOFOL 10 MG/ML IV BOLUS
INTRAVENOUS | Status: DC | PRN
  Administered 2015-03-15: 200 mg via INTRAVENOUS

## 2015-03-15 MED ORDER — PHENYLEPHRINE 40 MCG/ML (10ML) SYRINGE FOR IV PUSH (FOR BLOOD PRESSURE SUPPORT)
PREFILLED_SYRINGE | INTRAVENOUS | Status: AC
Start: 2015-03-15 — End: 2015-03-15
  Filled 2015-03-15: qty 10

## 2015-03-15 SURGICAL SUPPLY — 44 items
BAG DECANTER FOR FLEXI CONT (MISCELLANEOUS) ×2 IMPLANT
BLADE 10 SAFETY STRL DISP (BLADE) ×3 IMPLANT
BLADE AVERAGE 25MMX9MM (BLADE) ×1
BLADE AVERAGE 25X9 (BLADE) ×2 IMPLANT
BLADE SURG 15 STRL LF DISP TIS (BLADE) ×1 IMPLANT
BLADE SURG 15 STRL SS (BLADE) ×3
BNDG CMPR 9X4 STRL LF SNTH (GAUZE/BANDAGES/DRESSINGS) ×1
BNDG COHESIVE 3X5 TAN STRL LF (GAUZE/BANDAGES/DRESSINGS) ×3 IMPLANT
BNDG COHESIVE 4X5 TAN STRL (GAUZE/BANDAGES/DRESSINGS) ×2 IMPLANT
BNDG CONFORM 3 STRL LF (GAUZE/BANDAGES/DRESSINGS) ×3 IMPLANT
BNDG ESMARK 4X9 LF (GAUZE/BANDAGES/DRESSINGS) ×3 IMPLANT
BNDG GAUZE ELAST 4 BULKY (GAUZE/BANDAGES/DRESSINGS) ×2 IMPLANT
CHLORAPREP W/TINT 26ML (MISCELLANEOUS) ×1 IMPLANT
COVER SURGICAL LIGHT HANDLE (MISCELLANEOUS) ×3 IMPLANT
DRAPE OEC MINIVIEW 54X84 (DRAPES) ×3 IMPLANT
DRSG EMULSION OIL 3X3 NADH (GAUZE/BANDAGES/DRESSINGS) ×2 IMPLANT
ELECT REM PT RETURN 9FT ADLT (ELECTROSURGICAL) ×3
ELECTRODE REM PT RTRN 9FT ADLT (ELECTROSURGICAL) ×1 IMPLANT
GAUZE SPONGE 4X4 12PLY STRL (GAUZE/BANDAGES/DRESSINGS) ×3 IMPLANT
GAUZE XEROFORM 1X8 LF (GAUZE/BANDAGES/DRESSINGS) IMPLANT
GLOVE BIOGEL PI IND STRL 8 (GLOVE) IMPLANT
GLOVE BIOGEL PI INDICATOR 8 (GLOVE) ×4
GLOVE SURG SS PI 8.0 STRL IVOR (GLOVE) ×9 IMPLANT
GOWN STRL REUS W/ TWL LRG LVL3 (GOWN DISPOSABLE) ×2 IMPLANT
GOWN STRL REUS W/TWL LRG LVL3 (GOWN DISPOSABLE) ×6
KIT BASIN OR (CUSTOM PROCEDURE TRAY) ×3 IMPLANT
MATRIX SURGICAL PSMX 7X10CM (Tissue) ×2 IMPLANT
MICROMATRIX 500MG (Tissue) ×3 IMPLANT
NDL HYPO 25X1 1.5 SAFETY (NEEDLE) IMPLANT
NEEDLE HYPO 25X1 1.5 SAFETY (NEEDLE) IMPLANT
PACK ORTHO EXTREMITY (CUSTOM PROCEDURE TRAY) ×3 IMPLANT
PAD CAST 3X4 CTTN HI CHSV (CAST SUPPLIES) ×1 IMPLANT
PADDING CAST ABS 4INX4YD NS (CAST SUPPLIES) ×2
PADDING CAST ABS COTTON 4X4 ST (CAST SUPPLIES) ×1 IMPLANT
PADDING CAST COTTON 3X4 STRL (CAST SUPPLIES) ×3
SOLUTION PARTIC MCRMTRX 500MG (Tissue) IMPLANT
SPONGE GAUZE 4X4 12PLY STER LF (GAUZE/BANDAGES/DRESSINGS) ×2 IMPLANT
SUT ETHILON 4 0 PS 2 18 (SUTURE) ×3 IMPLANT
SUT VIC AB 4-0 PS2 27 (SUTURE) ×3 IMPLANT
SYR CONTROL 10ML LL (SYRINGE) ×3 IMPLANT
TOWEL OR 17X24 6PK STRL BLUE (TOWEL DISPOSABLE) ×9 IMPLANT
TUBE CONNECTING 12'X1/4 (SUCTIONS) ×1
TUBE CONNECTING 12X1/4 (SUCTIONS) ×1 IMPLANT
UNDERPAD 30X30 INCONTINENT (UNDERPADS AND DIAPERS) ×3 IMPLANT

## 2015-03-15 NOTE — H&P (Signed)
  Reviewed procedure and consent.  Patient wishes to proceed with the hammertoe repair and skin graft right great toe.

## 2015-03-15 NOTE — Anesthesia Postprocedure Evaluation (Signed)
  Anesthesia Post-op Note  Patient: Gerald Day  Procedure(s) Performed: Procedure(s) (LRB): TENOTOMY HT REPAIR/ULCER DEBRIDEMENT/GRAFT PREP/ACELL GRAFT APPLICATION (Right)  Patient Location: PACU  Anesthesia Type: General  Level of Consciousness: awake and alert   Airway and Oxygen Therapy: Patient Spontanous Breathing  Post-op Pain: mild  Post-op Assessment: Post-op Vital signs reviewed, Patient's Cardiovascular Status Stable, Respiratory Function Stable, Patent Airway and No signs of Nausea or vomiting  Last Vitals:  Filed Vitals:   03/15/15 0931  BP: 158/70  Pulse: 56  Temp: 36.6 C  Resp:     Post-op Vital Signs: stable   Complications: No apparent anesthesia complications

## 2015-03-15 NOTE — Anesthesia Preprocedure Evaluation (Signed)
Anesthesia Evaluation  Patient identified by MRN, date of birth, ID band Patient awake    Reviewed: Allergy & Precautions, NPO status , Patient's Chart, lab work & pertinent test results  History of Anesthesia Complications Negative for: history of anesthetic complications  Airway Mallampati: II  TM Distance: >3 FB Neck ROM: Full    Dental no notable dental hx. (+) Dental Advisory Given   Pulmonary sleep apnea and Continuous Positive Airway Pressure Ventilation ,  breath sounds clear to auscultation  Pulmonary exam normal       Cardiovascular hypertension, Pt. on medications Normal cardiovascular examRhythm:Regular Rate:Normal     Neuro/Psych  Headaches, negative psych ROS   GI/Hepatic negative GI ROS, Neg liver ROS,   Endo/Other  diabetesMorbid obesity  Renal/GU negative Renal ROS  negative genitourinary   Musculoskeletal  (+) Arthritis -, Osteoarthritis,    Abdominal   Peds negative pediatric ROS (+)  Hematology negative hematology ROS (+)   Anesthesia Other Findings   Reproductive/Obstetrics negative OB ROS                             Anesthesia Physical Anesthesia Plan  ASA: III  Anesthesia Plan: General   Post-op Pain Management:    Induction: Intravenous  Airway Management Planned: LMA  Additional Equipment:   Intra-op Plan:   Post-operative Plan: Extubation in OR  Informed Consent: I have reviewed the patients History and Physical, chart, labs and discussed the procedure including the risks, benefits and alternatives for the proposed anesthesia with the patient or authorized representative who has indicated his/her understanding and acceptance.   Dental advisory given  Plan Discussed with: CRNA  Anesthesia Plan Comments:         Anesthesia Quick Evaluation

## 2015-03-15 NOTE — OR Nursing (Signed)
Late entry for wound class change.

## 2015-03-15 NOTE — Discharge Instructions (Signed)
Podiatry Postoperative Discharge Instructions Dr. Elby Showers  1.  Day of Surgery: Please have your prescription (s) filled immediately upon leaving the surgery center if you have not already done so. Elevate both feet in the car on the way home. Please go directly to bed and keep your feet elevated by putting two pillows under your feet and one pillow under your knees. Keep your feet out from under the blanket.  2.  Discomfort and Swelling: Your foot may be numb for the remainder of the day. Swelling is expected. In some cases the skin of the foot or the leg may take on a bruised, black and blue appearance.  3.  Temperature: Take your temperature on the 2nd, 3rd, and 4th days after your surgery at 5pm. If your temperature is above 101 degrees please give your physician a call.  4.  Bleeding: A slight amount of drainage on the dressing is normal. Resting your foot in an elevated position will limit bleeding. If bleeding continues, wrap a towel around your foot and apply an ice pack.  5.  Dressing: Keep the bandage absolutely dry and do not remove the dressing. If the dressing becomes wet or soiled, call your physician's office immediately.  6.  Stitches: The stitches will remain in place for 2-3 weeks, depending on the nature of your operation. Slight pulling sensations may be felt due to the stitches. This is a normal occurrence.  7.  Ice: Apply a well-sealed ice pack to your foot for 30 minutes of every hour for the first 24 hours after your surgery. This means 30 minutes on and 30 minutes off, each hour while awake. Do not leave the ice pack in place at bedtime or during long naps. If the ice you are using becomes wet on the outside as the ice melts, place a washcloth between the ice pack and the dressing.  8.  Shoes: Wear your postoperative shoe or boot anytime you put weight on your feet, even if it is just to walk to the bathroom and back. Remove boot or surgical shoe while non-weightbearing for  gentle range of motion exercises at the knee and gas pedal motion at the ankle and wiggling toes several times per day unless otherwise instructed.  It will be at least 2-3 weeks before you can wear your regular shoes again. Please do not wear anything other than your postoperative shoe until told to do so by your physician.  9.  Diet: Return to your regular diet slowly within 24 hours. If you received sedation, your first meal should be light. Do not eat greasy or spicy foods for the first meal. Drink large quantities of liquids, especially water, citrus, and other fruit juices. Please call if unable to keep fluids down.  10. General Activities: Recovery is a gradual process; however, you should feel better with each passing day. On the first day, only leave the bed to go to the bathroom. Gradually increase your activity after the first day. For the first week or two, resting each day is important; strenuous work, heavy lifting, and excessive social activities should be avoided.   Podiatry Postoperative Discharge Instructions ( Page 2)  11. Post-operative Care: The post-operative care period lasts for approximately six weeks. During this time, periodic visits to your physician's office will be required so your healing process can be monitored closely. It is essential for your future health that you continue to be monitored by your physician until you are discharged and completely healed. Care  during this time is the most important part of your recovery process.  12. Recovery from Anesthesia: You may feel drowsy and your reflexes may be slowed for 24 hours. Do not drive, use machinery, appliances, or ride bicycles or scooters. Do not make important decisions.  13. Complications: Please call your physician following your surgery if you have any of the following complications:      1. Severe pain unrelieved by medication      2. Excessive, heavy, or prolonged bleeding     3. Dizziness or  fainting     4. Soiled or wet dressings     5. Temperature over 101.0 degrees   Dr. Elby Showers, [facility] Office: [phone] Answering Service: [phone]

## 2015-03-15 NOTE — Op Note (Signed)
03/15/2015  8:52 AM  PATIENT:  Gerald Day  59 y.o. male  PRE-OPERATIVE DIAGNOSIS:  ULCERATION RIGHT FOOT  POST-OPERATIVE DIAGNOSIS:  ULCERATION RIGHT FOOT  PROCEDURE:  Procedure(s) with comments: TENOTOMY HT REPAIR/ULCER DEBRIDEMENT/GRAFT PREP/ACELL GRAFT APPLICATION (Right) - HALLUX  SURGEON:  Surgeon(s) and Role:    * Jana Half, DPM - Primary  PHYSICIAN ASSISTANT:   ASSISTANTS: none   ANESTHESIA:   general  EBL:     BLOOD ADMINISTERED:none  DRAINS: none   LOCAL MEDICATIONS USED:  OTHER 1% Lidocaine with epinephrine 1:100,000, 10 mL  SPECIMEN:  No Specimen  DISPOSITION OF SPECIMEN:  N/A  COUNTS:  YES  TOURNIQUET:  * No tourniquets in log *  DICTATION: .Dragon Dictation  PLAN OF CARE: Discharge to home after PACU  PATIENT DISPOSITION:  PACU - hemodynamically stable.   Delay start of Pharmacological VTE agent (>24hrs) due to surgical blood loss or risk of bleeding: not applicable  SURGICAL INDICATIONS:  Patient is here for surgical intervention and surgery is further discussed today based on our in-office consultation.  All questions were answered and consent is signed and in the chart outlining risks versus benefits.  The surgical site is marked today and I reviewed the planned procedure(s) with the patient today in preoperative holding area.  Talked about performing a hammertoe repair on the right hallux and skin graft on the right hallux secondary to chronic ulceration.  PROCEDURES PERFORMED:  Patient is transported to the operating room and the surgical site is prepped and draped in the usual sterile manner.  Patient is placed in a supine position and area is prepped and draped in the usual sterile manner.  Flexor hallucis longus tenotomy, hammertoe repair right hallux: Stab incision is made at the plantar right hallux releasing the capsule and flexor hallucis longus tendon allowing the hallux to assume a more rectus position. Wound is irrigated and  allowed to heal by secondary intention.  Nail avulsion right hallux. Hallux nail plate is avulsed to take pressure off the distal hallux.  Skin graft right hallux: Debrided devitalized soft tissue and bone at the distal hallux to bleeding healthy tissues. Hemostasis was maintained. We then used A CELL skin graft material in powdered form to fill into the defect to fill a completely flush with the surrounding soft tissues. This was done after we thoroughly flushed the area with sterile anabolic solution. We then used in A Cell skin graft sheath 7 cm x 10 cm and cut to the margins of the wound and applied with small fenestrations. Then applied Adaptic dressing and 4 x 4's along with Kerlix and Coban.  Patient returns to PACU having tolerated the procedure and anesthesia well.  Patient is given written and oral post op instructions.  Patient will follow up in my office as scheduled for a post op appointment.

## 2015-03-15 NOTE — Anesthesia Procedure Notes (Signed)
Procedure Name: Intubation Date/Time: 03/15/2015 8:07 AM Performed by: Talbot Grumbling Pre-anesthesia Checklist: Patient identified, Emergency Drugs available and Suction available Patient Re-evaluated:Patient Re-evaluated prior to inductionOxygen Delivery Method: Circle system utilized Preoxygenation: Pre-oxygenation with 100% oxygen Intubation Type: IV induction Ventilation: Oral airway inserted - appropriate to patient size and Mask ventilation without difficulty Laryngoscope Size: Mac and 4 Grade View: Grade III Tube type: Oral Tube size: 8.0 mm Number of attempts: 1 Airway Equipment and Method: Stylet (Blankets used to create wedge.) Placement Confirmation: ETT inserted through vocal cords under direct vision,  positive ETCO2 and breath sounds checked- equal and bilateral Secured at: 22 cm Tube secured with: Tape Dental Injury: Teeth and Oropharynx as per pre-operative assessment

## 2015-03-15 NOTE — Transfer of Care (Signed)
Immediate Anesthesia Transfer of Care Note  Patient: Gerald Day  Procedure(s) Performed: Procedure(s) with comments: TENOTOMY HT REPAIR/ULCER DEBRIDEMENT/GRAFT PREP/ACELL GRAFT APPLICATION (Right) - HALLUX  Patient Location: PACU  Anesthesia Type:General  Level of Consciousness: awake, alert  and oriented  Airway & Oxygen Therapy: Patient Spontanous Breathing and Patient connected to face mask oxygen  Post-op Assessment: Report given to RN  Post vital signs: Reviewed  Last Vitals:  Filed Vitals:   03/15/15 0629  BP: 198/85  Pulse: 61  Temp: 37 C  Resp: 20    Complications: No apparent anesthesia complications

## 2015-03-18 ENCOUNTER — Encounter (HOSPITAL_COMMUNITY): Payer: Self-pay | Admitting: Podiatry

## 2015-05-21 ENCOUNTER — Telehealth: Payer: Self-pay | Admitting: Internal Medicine

## 2015-05-21 ENCOUNTER — Other Ambulatory Visit: Payer: Self-pay | Admitting: Internal Medicine

## 2015-05-21 MED ORDER — ATORVASTATIN CALCIUM 10 MG PO TABS: 10.0000 mg | ORAL_TABLET | Freq: Every day | ORAL | Status: DC

## 2015-05-21 NOTE — Telephone Encounter (Signed)
Patient states he needs new rx for atorvastatin sent to [facility]. He states he already called his pharmacy a few days ago, but I do not see that we received a request.

## 2015-09-11 ENCOUNTER — Telehealth: Payer: Self-pay | Admitting: Internal Medicine

## 2015-09-11 DIAGNOSIS — Z0000 Encounter for general adult medical examination without abnormal findings: Secondary | ICD-10-CM

## 2015-09-11 DIAGNOSIS — E119 Type 2 diabetes mellitus without complications: Secondary | ICD-10-CM

## 2015-09-11 NOTE — Telephone Encounter (Signed)
Labs ordered.

## 2015-09-11 NOTE — Telephone Encounter (Signed)
Pt called to schedule his physical in March. He is wondering if he can do his lab work ahead of time. Please advise

## 2015-09-12 NOTE — Telephone Encounter (Signed)
Was unable to leave msg. Phone just kept ringing

## 2015-11-01 ENCOUNTER — Ambulatory Visit: Payer: BLUE CROSS/BLUE SHIELD | Admitting: Pulmonary Disease

## 2015-11-04 ENCOUNTER — Ambulatory Visit (INDEPENDENT_AMBULATORY_CARE_PROVIDER_SITE_OTHER): Payer: BLUE CROSS/BLUE SHIELD | Admitting: Pulmonary Disease

## 2015-11-04 ENCOUNTER — Encounter: Payer: Self-pay | Admitting: Pulmonary Disease

## 2015-11-04 VITALS — BP 118/82 | HR 67 | Ht 72.0 in | Wt >= 6400 oz

## 2015-11-04 DIAGNOSIS — G4733 Obstructive sleep apnea (adult) (pediatric): Secondary | ICD-10-CM

## 2015-11-04 NOTE — Assessment & Plan Note (Signed)
We will review CPAP report CPAP supplies will be renewed x 1 year  Weight loss encouraged, compliance with goal of at least 4-6 hrs every night is the expectation. Advised against medications with sedative side effects Cautioned against driving when sleepy - understanding that sleepiness will vary on a day to day basis

## 2015-11-04 NOTE — Patient Instructions (Signed)
We will review CPAP report CPAP supplies will be renewed x 1 year

## 2015-11-04 NOTE — Progress Notes (Signed)
   Subjective:    Patient ID: Gerald Day, male    DOB: 1956/08/06, 60 y.o.   MRN: [id number]  HPI  60 year old for follow-up of obstructive sleep apnea. He got a new machine in 2016  11/04/2015  Chief Complaint  Patient presents with  . Follow-up    Former [facility] patient; pt forgot plug for CPAP to get download, will bring it back tomorrow to obtain download; patient needs new mask, current mask is worn out.  doing well, no concerns.   He needs a new mask his current mask is worn out No download is available to review Pressure is okay on the machine, no dryness He uses it more than 6 hours every night and feels rested on waking up denies daytime somnolence His weight has increased about 10 pounds since his last visit  NPSG 2002:  AHI 92/hr   Review of Systems neg for any significant sore throat, dysphagia, itching, sneezing, nasal congestion or excess/ purulent secretions, fever, chills, sweats, unintended wt loss, pleuritic or exertional cp, hempoptysis, orthopnea pnd or change in chronic leg swelling.  Also denies presyncope, palpitations, heartburn, abdominal pain, nausea, vomiting, diarrhea or change in bowel or urinary habits, dysuria,hematuria, rash, arthralgias, visual complaints, headache, numbness weakness or ataxia.     Objective:   Physical Exam  Gen. Pleasant, obese, in no distress ENT - no lesions, no post nasal drip Neck: No JVD, no thyromegaly, no carotid bruits Lungs: no use of accessory muscles, no dullness to percussion, decreased without rales or rhonchi  Cardiovascular: Rhythm regular, heart sounds  normal, no murmurs or gallops, no peripheral edema Musculoskeletal: No deformities, no cyanosis or clubbing , no tremors        Assessment & Plan:

## 2015-11-05 NOTE — Assessment & Plan Note (Signed)
Weight loss encouraged 

## 2015-11-12 ENCOUNTER — Other Ambulatory Visit: Payer: Self-pay | Admitting: Internal Medicine

## 2015-11-12 ENCOUNTER — Ambulatory Visit (INDEPENDENT_AMBULATORY_CARE_PROVIDER_SITE_OTHER): Payer: BLUE CROSS/BLUE SHIELD | Admitting: Internal Medicine

## 2015-11-12 ENCOUNTER — Encounter: Payer: Self-pay | Admitting: Internal Medicine

## 2015-11-12 ENCOUNTER — Other Ambulatory Visit (INDEPENDENT_AMBULATORY_CARE_PROVIDER_SITE_OTHER): Payer: BLUE CROSS/BLUE SHIELD

## 2015-11-12 VITALS — BP 130/86 | HR 73 | Temp 98.3°F | Resp 20 | Wt >= 6400 oz

## 2015-11-12 DIAGNOSIS — E119 Type 2 diabetes mellitus without complications: Secondary | ICD-10-CM

## 2015-11-12 DIAGNOSIS — Z0000 Encounter for general adult medical examination without abnormal findings: Secondary | ICD-10-CM

## 2015-11-12 DIAGNOSIS — I10 Essential (primary) hypertension: Secondary | ICD-10-CM | POA: Diagnosis not present

## 2015-11-12 DIAGNOSIS — L989 Disorder of the skin and subcutaneous tissue, unspecified: Secondary | ICD-10-CM

## 2015-11-12 DIAGNOSIS — E785 Hyperlipidemia, unspecified: Secondary | ICD-10-CM

## 2015-11-12 LAB — URINALYSIS, ROUTINE W REFLEX MICROSCOPIC
Bilirubin Urine: NEGATIVE
Leukocytes, UA: NEGATIVE
Nitrite: NEGATIVE
PH: 6 (ref 5.0–8.0)
Total Protein, Urine: >=300 — AB
Urine Glucose: NEGATIVE
Urobilinogen, UA: 0.2 (ref 0.0–1.0)
WBC, UA: NONE SEEN (ref 0–?)

## 2015-11-12 LAB — CBC WITH DIFFERENTIAL/PLATELET
BASOS PCT: 0.4 % (ref 0.0–3.0)
Basophils Absolute: 0 10*3/uL (ref 0.0–0.1)
EOS PCT: 4.1 % (ref 0.0–5.0)
Eosinophils Absolute: 0.4 10*3/uL (ref 0.0–0.7)
HCT: 40.3 % (ref 39.0–52.0)
Hemoglobin: 14 g/dL (ref 13.0–17.0)
LYMPHS ABS: 1.3 10*3/uL (ref 0.7–4.0)
Lymphocytes Relative: 15.2 % (ref 12.0–46.0)
MCHC: 34.7 g/dL (ref 30.0–36.0)
MCV: 87.6 fl (ref 78.0–100.0)
MONO ABS: 0.7 10*3/uL (ref 0.1–1.0)
Monocytes Relative: 8.2 % (ref 3.0–12.0)
NEUTROS ABS: 6.1 10*3/uL (ref 1.4–7.7)
NEUTROS PCT: 72.1 % (ref 43.0–77.0)
PLATELETS: 216 10*3/uL (ref 150.0–400.0)
RBC: 4.61 Mil/uL (ref 4.22–5.81)
RDW: 13.9 % (ref 11.5–15.5)
WBC: 8.5 10*3/uL (ref 4.0–10.5)

## 2015-11-12 LAB — BASIC METABOLIC PANEL
BUN: 20 mg/dL (ref 6–23)
CO2: 29 meq/L (ref 19–32)
Calcium: 9.9 mg/dL (ref 8.4–10.5)
Chloride: 102 mEq/L (ref 96–112)
Creatinine, Ser: 1.07 mg/dL (ref 0.40–1.50)
GFR: 74.7 mL/min (ref >60.00)
GLUCOSE: 176 mg/dL — AB (ref 70–99)
POTASSIUM: 4.4 meq/L (ref 3.5–5.1)
SODIUM: 142 meq/L (ref 135–145)

## 2015-11-12 LAB — HEPATIC FUNCTION PANEL
ALBUMIN: 3.9 g/dL (ref 3.5–5.2)
ALK PHOS: 75 U/L (ref 39–117)
ALT: 17 U/L (ref 0–53)
AST: 16 U/L (ref 0–37)
BILIRUBIN DIRECT: 0.1 mg/dL (ref 0.0–0.3)
Total Bilirubin: 0.7 mg/dL (ref 0.2–1.2)
Total Protein: 7 g/dL (ref 6.0–8.3)

## 2015-11-12 LAB — LIPID PANEL
CHOLESTEROL: 168 mg/dL (ref 0–200)
HDL: 50.8 mg/dL (ref >39.00)
LDL CALC: 82 mg/dL (ref 0–99)
NONHDL: 117.43
Total CHOL/HDL Ratio: 3
Triglycerides: 179 mg/dL — ABNORMAL HIGH (ref 0.0–149.0)
VLDL: 35.8 mg/dL (ref 0.0–40.0)

## 2015-11-12 LAB — HEMOGLOBIN A1C: HEMOGLOBIN A1C: 8 % — AB (ref 4.6–6.5)

## 2015-11-12 LAB — MICROALBUMIN / CREATININE URINE RATIO
CREATININE, U: 138 mg/dL
MICROALB UR: 342.2 mg/dL — AB (ref 0.0–1.9)
Microalb Creat Ratio: 247.9 mg/g — ABNORMAL HIGH (ref 0.0–30.0)

## 2015-11-12 LAB — TSH: TSH: 2.31 u[IU]/mL (ref 0.35–4.50)

## 2015-11-12 LAB — PSA: PSA: 0.4 ng/mL (ref 0.10–4.00)

## 2015-11-12 MED ORDER — POTASSIUM CHLORIDE CRYS ER 20 MEQ PO TBCR: 20.0000 meq | EXTENDED_RELEASE_TABLET | Freq: Every day | ORAL | Status: DC

## 2015-11-12 MED ORDER — FUROSEMIDE 80 MG PO TABS: 80.0000 mg | ORAL_TABLET | Freq: Two times a day (BID) | ORAL | Status: DC

## 2015-11-12 MED ORDER — GLIPIZIDE ER 5 MG PO TB24: 5.0000 mg | ORAL_TABLET | Freq: Every day | ORAL | Status: DC

## 2015-11-12 NOTE — Progress Notes (Signed)
Subjective:    Patient ID: Gerald Day, male    DOB: 29-Mar-1956, 60 y.o.   MRN: [id number]  HPI  Here for wellness and f/u;  Overall doing ok;  Pt denies Chest pain, worsening SOB, DOE, wheezing, orthopnea, PND, worsening LE edema, palpitations, dizziness or syncope.  Pt denies neurological change such as new headache, facial or extremity weakness.  Pt denies polydipsia, polyuria, or low sugar symptoms. Pt states overall good compliance with treatment and medications, good tolerability, and has been trying to follow appropriate diet.  Pt denies worsening depressive symptoms, suicidal ideation or panic. No fever, night sweats, wt loss, loss of appetite, or other constitutional symptoms.  Pt states good ability with ADL's, has low fall risk, home safety reviewed and adequate, no other significant changes in hearing or vision, and only occasionally active with exercise. Recently shaved beard 2 wks ago, now with noted skin lesion right neck uncovered, dark, somewhat raised, probably enlarging. Has glaucoma, with freq visits, wants to hold off on colonoscopy for now due to this. Past Medical History  Diagnosis Date  . LOW BACK PAIN   . CELLULITIS/ABSCESS, LEG 06/27/2007  . ERECTILE DYSFUNCTION, ORGANIC   . DIVERTICULOSIS, COLON   . CONGESTIVE HEART FAILURE   . HYPERTENSION   . Morbid obesity ([place])   . GOUT NOS   . HYPERLIPIDEMIA   . COLONIC POLYPS   . SLEEP APNEA, OBSTRUCTIVE     USES CPAP  . DIABETES MELLITUS, TYPE II   . Arthritis     IN KNEES  . Cellulitis 10/26/2013    LOWER RT EXTREMITY  . Headache    Past Surgical History  Procedure Laterality Date  . Appendectomy    . Foot surgery      LT  . Tenotomy / flexor tendon transfer Right 03/15/2015    Procedure: TENOTOMY HT REPAIR/ULCER DEBRIDEMENT/GRAFT PREP/ACELL GRAFT APPLICATION;  Surgeon: Jana Half, DPM;  Location: [place];  Service: Podiatry;  Laterality: Right;  HALLUX    reports that he has never smoked. He has never used  smokeless tobacco. He reports that he drinks about 1.2 oz of alcohol per week. He reports that he does not use illicit drugs. family history includes Asthma in his other; Cardiomyopathy in his father. Allergies  Allergen Reactions  . Penicillins Other (See Comments)    Unknown childhood allergic reaction   Current Outpatient Prescriptions on File Prior to Visit  Medication Sig Dispense Refill  . amLODipine (NORVASC) 10 MG tablet TAKE 1 TABLET (10 MG TOTAL) BY MOUTH DAILY. 90 tablet 1  . atorvastatin (LIPITOR) 10 MG tablet Take 1 tablet (10 mg total) by mouth daily. 90 tablet 1  . bimatoprost (LUMIGAN) 0.01 % SOLN Place 1 drop into both eyes at bedtime.    . brimonidine-timolol (COMBIGAN) 0.2-0.5 % ophthalmic solution Place 1 drop into the right eye 2 (two) times daily.    . brinzolamide (AZOPT) 1 % ophthalmic suspension Place 1 drop into both eyes 2 (two) times daily.    Marland Kitchen glucose blood (ONE TOUCH ULTRA TEST) test strip Use as directed once daily to check blood sugar.  Diagnosis code 250.02 100 each 11  . metFORMIN (GLUCOPHAGE-XR) 500 MG 24 hr tablet TAKE 4 TABLETS BY MOUTH PER DAY (Patient taking differently: TAKE 4 TABLETS BY MOUTH EVERY MORNING) 360 tablet 3   No current facility-administered medications on file prior to visit.    Review of Systems Constitutional: Negative for increased diaphoresis, other activity, appetite or siginficant  weight change other than noted HENT: Negative for worsening hearing loss, ear pain, facial swelling, mouth sores and neck stiffness.   Eyes: Negative for other worsening pain, redness or visual disturbance.  Respiratory: Negative for shortness of breath and wheezing  Cardiovascular: Negative for chest pain and palpitations.  Gastrointestinal: Negative for diarrhea, blood in stool, abdominal distention or other pain Genitourinary: Negative for hematuria, flank pain or change in urine volume.  Musculoskeletal: Negative for myalgias or other joint  complaints.  Skin: Negative for color change and wound or drainage.  Neurological: Negative for syncope and numbness. other than noted Hematological: Negative for adenopathy. or other swelling Psychiatric/Behavioral: Negative for hallucinations, SI, self-injury, decreased concentration or other worsening agitation.      Objective:   Physical Exam BP 130/86 mmHg  Pulse 73  Temp(Src) 98.3 F (36.8 C) (Oral)  Resp 20  Wt 403 lb (182.8 kg)  SpO2 97% VS noted,  Constitutional: Pt is oriented to person, place, and time. Appears well-developed and well-nourished, in no significant distress Head: Normocephalic and atraumatic.  Right Ear: External ear normal.  Left Ear: External ear normal.  Nose: Nose normal.  Mouth/Throat: Oropharynx is clear and moist.  Eyes: Conjunctivae and EOM are normal. Pupils are equal, round, and reactive to light.  Neck: Normal range of motion. Neck supple. No JVD present. No tracheal deviation present or significant neck LA or mass Cardiovascular: Normal rate, regular rhythm, normal heart sounds and intact distal pulses.   Pulmonary/Chest: Effort normal and breath sounds without rales or wheezing  Abdominal: Soft. Bowel sounds are normal. NT. No HSM  Musculoskeletal: Normal range of motion. Lymphadenopathy:  Has no cervical adenopathy.  Neurological: Pt is alert and oriented to person, place, and time. Pt has normal reflexes. No cranial nerve deficit. Motor grossly intact Skin: Skin is warm and dry. No rash noted.  Psychiatric:  Has normal mood and affect. Behavior is normal.  Right neck skin lesion noted, dark, somewhat raised, 1 cm x 8 mm, somewhat irreg 2+ chronic LE edema    Assessment & Plan:

## 2015-11-12 NOTE — Progress Notes (Signed)
Pre visit review using our clinic review tool, if applicable. No additional management support is needed unless otherwise documented below in the visit note. 

## 2015-11-12 NOTE — Patient Instructions (Addendum)
You had the Prevnar pneumonia shot today  Your EKG was OK today  Please continue all other medications as before, and refills have been done if requested.  Please have the pharmacy call with any other refills you may need.  Please continue your efforts at being more active, low cholesterol diet, and weight control.  You are otherwise up to date with prevention measures today.  Please keep your appointments with your specialists as you may have planned  You will be contacted regarding the referral for: dermatology  Please go to the LAB in the Basement (turn left off the elevator) for the tests to be done today  You will be contacted by phone if any changes need to be made immediately.  Otherwise, you will receive a letter about your results with an explanation, but please check with MyChart first.  Please remember to sign up for MyChart if you have not done so, as this will be important to you in the future with finding out test results, communicating by private email, and scheduling acute appointments online when needed.  Please return in 6 months, or sooner if needed, with Lab testing done 3-5 days before

## 2015-11-12 NOTE — Assessment & Plan Note (Signed)

## 2015-11-17 NOTE — Assessment & Plan Note (Signed)
stable overall by history and exam, recent data reviewed with pt, and pt to continue medical treatment as before,  to f/u any worsening symptoms or concerns Lab Results  Component Value Date   LDLCALC 82 11/12/2015

## 2015-11-17 NOTE — Assessment & Plan Note (Signed)
stable overall by history and exam, recent data reviewed with pt, and pt to continue medical treatment as before,  to f/u any worsening symptoms or concerns BP Readings from Last 3 Encounters:  11/12/15 130/86  11/04/15 118/82  03/15/15 158/70

## 2015-11-17 NOTE — Assessment & Plan Note (Signed)
Newly noted after beard shaved, cant r/o malignancy , for derm referral

## 2015-11-17 NOTE — Assessment & Plan Note (Signed)
stable overall by history and exam, recent data reviewed with pt, and pt to continue medical treatment as before,  to f/u any worsening symptoms or concerns Lab Results  Component Value Date   HGBA1C 8.0* 11/12/2015

## 2016-01-21 ENCOUNTER — Other Ambulatory Visit: Payer: Self-pay | Admitting: Internal Medicine

## 2016-01-23 ENCOUNTER — Other Ambulatory Visit: Payer: Self-pay | Admitting: General Surgery

## 2016-02-26 ENCOUNTER — Other Ambulatory Visit: Payer: Self-pay | Admitting: Internal Medicine

## 2016-05-18 ENCOUNTER — Other Ambulatory Visit (INDEPENDENT_AMBULATORY_CARE_PROVIDER_SITE_OTHER): Payer: BLUE CROSS/BLUE SHIELD

## 2016-05-18 DIAGNOSIS — E119 Type 2 diabetes mellitus without complications: Secondary | ICD-10-CM

## 2016-05-18 LAB — BASIC METABOLIC PANEL
BUN: 17 mg/dL (ref 6–23)
CHLORIDE: 103 meq/L (ref 96–112)
CO2: 30 meq/L (ref 19–32)
Calcium: 9.5 mg/dL (ref 8.4–10.5)
Creatinine, Ser: 0.94 mg/dL (ref 0.40–1.50)
GFR: 86.6 mL/min (ref >60.00)
Glucose, Bld: 160 mg/dL — ABNORMAL HIGH (ref 70–99)
POTASSIUM: 4.6 meq/L (ref 3.5–5.1)
Sodium: 140 mEq/L (ref 135–145)

## 2016-05-18 LAB — LIPID PANEL
CHOL/HDL RATIO: 3
Cholesterol: 157 mg/dL (ref 0–200)
HDL: 52.5 mg/dL (ref >39.00)
LDL Cholesterol: 71 mg/dL (ref 0–99)
NONHDL: 104.22
TRIGLYCERIDES: 164 mg/dL — AB (ref 0.0–149.0)
VLDL: 32.8 mg/dL (ref 0.0–40.0)

## 2016-05-18 LAB — HEPATIC FUNCTION PANEL
ALT: 17 U/L (ref 0–53)
AST: 12 U/L (ref 0–37)
Albumin: 3.8 g/dL (ref 3.5–5.2)
Alkaline Phosphatase: 65 U/L (ref 39–117)
BILIRUBIN DIRECT: 0.1 mg/dL (ref 0.0–0.3)
BILIRUBIN TOTAL: 0.6 mg/dL (ref 0.2–1.2)
TOTAL PROTEIN: 7.5 g/dL (ref 6.0–8.3)

## 2016-05-18 LAB — HEMOGLOBIN A1C: HEMOGLOBIN A1C: 6.6 % — AB (ref 4.6–6.5)

## 2016-05-20 ENCOUNTER — Encounter: Payer: Self-pay | Admitting: Internal Medicine

## 2016-05-20 ENCOUNTER — Ambulatory Visit (INDEPENDENT_AMBULATORY_CARE_PROVIDER_SITE_OTHER): Payer: BLUE CROSS/BLUE SHIELD | Admitting: Internal Medicine

## 2016-05-20 VITALS — BP 140/80 | HR 86 | Temp 98.0°F | Resp 20 | Wt >= 6400 oz

## 2016-05-20 DIAGNOSIS — I10 Essential (primary) hypertension: Secondary | ICD-10-CM | POA: Diagnosis not present

## 2016-05-20 DIAGNOSIS — E785 Hyperlipidemia, unspecified: Secondary | ICD-10-CM

## 2016-05-20 DIAGNOSIS — E119 Type 2 diabetes mellitus without complications: Secondary | ICD-10-CM

## 2016-05-20 DIAGNOSIS — F528 Other sexual dysfunction not due to a substance or known physiological condition: Secondary | ICD-10-CM

## 2016-05-20 DIAGNOSIS — Z0001 Encounter for general adult medical examination with abnormal findings: Secondary | ICD-10-CM

## 2016-05-20 DIAGNOSIS — R6889 Other general symptoms and signs: Secondary | ICD-10-CM

## 2016-05-20 MED ORDER — LOSARTAN POTASSIUM 50 MG PO TABS: 50.0000 mg | ORAL_TABLET | Freq: Every day | ORAL | 3 refills | Status: DC

## 2016-05-20 MED ORDER — TADALAFIL 20 MG PO TABS: 20.0000 mg | ORAL_TABLET | Freq: Every day | ORAL | 11 refills | Status: DC | PRN

## 2016-05-20 NOTE — Assessment & Plan Note (Signed)
stable overall by history and exam, recent data reviewed with pt, and pt to continue medical treatment as before,  to f/u any worsening symptoms or concerns Lab Results  Component Value Date   HGBA1C 6.6 (H) 05/18/2016

## 2016-05-20 NOTE — Progress Notes (Signed)
Subjective:    Patient ID: Gerald Day, male    DOB: 1956-08-13, 60 y.o.   MRN: [id number]  HPI  Here to f/u; overall doing ok,  Pt denies chest pain, increasing sob or doe, wheezing, orthopnea, PND, increased LE swelling, palpitations, dizziness or syncope.  Pt denies new neurological symptoms such as new headache, or facial or extremity weakness or numbness.  Pt denies polydipsia, polyuria, or low sugar episode.   Pt denies new neurological symptoms such as new headache, or facial or extremity weakness or numbness.   Pt states overall good compliance with meds, mostly trying to follow appropriate diet, with wt overall stable,  but little exercise however. Has ongoing Ed symptoms, has not used PDE5 med due to cost, but asking for refill now  Past Medical History:  Diagnosis Date  . Arthritis    IN KNEES  . Cellulitis 10/26/2013   LOWER RT EXTREMITY  . CELLULITIS/ABSCESS, LEG 06/27/2007  . COLONIC POLYPS   . CONGESTIVE HEART FAILURE   . DIABETES MELLITUS, TYPE II   . DIVERTICULOSIS, COLON   . ERECTILE DYSFUNCTION, ORGANIC   . GOUT NOS   . Headache   . HYPERLIPIDEMIA   . HYPERTENSION   . LOW BACK PAIN   . Morbid obesity ([place])   . SLEEP APNEA, OBSTRUCTIVE    USES CPAP   Past Surgical History:  Procedure Laterality Date  . APPENDECTOMY    . FOOT SURGERY     LT  . TENOTOMY / FLEXOR TENDON TRANSFER Right 03/15/2015   Procedure: TENOTOMY HT REPAIR/ULCER DEBRIDEMENT/GRAFT PREP/ACELL GRAFT APPLICATION;  Surgeon: Jana Half, DPM;  Location: [place];  Service: Podiatry;  Laterality: Right;  HALLUX    reports that he has never smoked. He has never used smokeless tobacco. He reports that he drinks about 1.2 oz of alcohol per week . He reports that he does not use drugs. family history includes Asthma in his other; Cardiomyopathy in his father. Allergies  Allergen Reactions  . Penicillins Other (See Comments)    Unknown childhood allergic reaction   Current Outpatient Prescriptions  on File Prior to Visit  Medication Sig Dispense Refill  . amLODipine (NORVASC) 10 MG tablet TAKE 1 TABLET (10 MG TOTAL) BY MOUTH DAILY. 90 tablet 1  . atorvastatin (LIPITOR) 10 MG tablet TAKE 1 TABLET (10 MG TOTAL) BY MOUTH DAILY. 90 tablet 1  . bimatoprost (LUMIGAN) 0.01 % SOLN Place 1 drop into both eyes at bedtime.    . brimonidine-timolol (COMBIGAN) 0.2-0.5 % ophthalmic solution Place 1 drop into the right eye 2 (two) times daily.    . brinzolamide (AZOPT) 1 % ophthalmic suspension Place 1 drop into both eyes 2 (two) times daily.    . furosemide (LASIX) 80 MG tablet Take 1 tablet (80 mg total) by mouth 2 (two) times daily. 60 tablet 11  . glipiZIDE (GLUCOTROL XL) 5 MG 24 hr tablet Take 1 tablet (5 mg total) by mouth daily with breakfast. 90 tablet 3  . glucose blood (ONE TOUCH ULTRA TEST) test strip Use as directed once daily to check blood sugar.  Diagnosis code 250.02 100 each 11  . metFORMIN (GLUCOPHAGE-XR) 500 MG 24 hr tablet TAKE 4 TABLETS BY MOUTH PER DAY 360 tablet 3  . potassium chloride SA (K-DUR,KLOR-CON) 20 MEQ tablet Take 1 tablet (20 mEq total) by mouth daily. 30 tablet 11   No current facility-administered medications on file prior to visit.    Review of Systems  Constitutional: Negative  for unusual diaphoresis or night sweats HENT: Negative for ear swelling or discharge Eyes: Negative for worsening visual haziness  Respiratory: Negative for choking and stridor.   Gastrointestinal: Negative for distension or worsening eructation Genitourinary: Negative for retention or change in urine volume.  Musculoskeletal: Negative for other MSK pain or swelling Skin: Negative for color change and worsening wound Neurological: Negative for tremors and numbness other than noted  Psychiatric/Behavioral: Negative for decreased concentration or agitation other than above       Objective:   Physical Exam BP 140/80   Pulse 86   Temp 98 F (36.7 C) (Oral)   Resp 20   Wt (!) 410 lb  (186 kg)   SpO2 95%   BMI 55.61 kg/m  VS noted, morbid obese Constitutional: Pt appears in no apparent distress HENT: Head: NCAT.  Right Ear: External ear normal.  Left Ear: External ear normal.  Eyes: . Pupils are equal, round, and reactive to light. Conjunctivae and EOM are normal Neck: Normal range of motion. Neck supple.  Cardiovascular: Normal rate and regular rhythm.   Pulmonary/Chest: Effort normal and breath sounds without rales or wheezing.  Neurological: Pt is alert. Not confused , motor grossly intact Skin: Skin is warm. No rash, no LE edema Psychiatric: Pt behavior is normal. No agitation.      Assessment & Plan:

## 2016-05-20 NOTE — Assessment & Plan Note (Signed)
Ok for cialis prn,  to f/u any worsening symptoms or concerns  

## 2016-05-20 NOTE — Patient Instructions (Signed)
Please take all new medication as prescribed - the losartan 50 mg per day for blood pressure and kidney protection  Please continue all other medications as before, and refills have been done if requested - the cialis  Please have the pharmacy call with any other refills you may need.  Please continue your efforts at being more active, low cholesterol diet, and weight control.  Please keep your appointments with your specialists as you may have planned  Please return in 6 months, or sooner if needed, with Lab testing done 3-5 days before

## 2016-05-20 NOTE — Progress Notes (Signed)
Pre visit review using our clinic review tool, if applicable. No additional management support is needed unless otherwise documented below in the visit note. 

## 2016-05-20 NOTE — Assessment & Plan Note (Signed)
stable overall by history and exam, recent data reviewed with pt, and pt to continue medical treatment as before,  to f/u any worsening symptoms or concerns Lab Results  Component Value Date   LDLCALC 71 05/18/2016

## 2016-05-20 NOTE — Assessment & Plan Note (Signed)
stable overall by history and exam, recent data reviewed with pt, and pt to continue medical treatment as before,  to f/u any worsening symptoms or concerns BP Readings from Last 3 Encounters:  05/20/16 140/80  11/12/15 130/86  11/04/15 118/82

## 2016-09-18 ENCOUNTER — Other Ambulatory Visit: Payer: Self-pay | Admitting: Internal Medicine

## 2016-09-26 ENCOUNTER — Other Ambulatory Visit: Payer: Self-pay | Admitting: Internal Medicine

## 2016-10-03 ENCOUNTER — Ambulatory Visit (INDEPENDENT_AMBULATORY_CARE_PROVIDER_SITE_OTHER): Payer: BLUE CROSS/BLUE SHIELD | Admitting: Physician Assistant

## 2016-10-03 ENCOUNTER — Encounter: Payer: Self-pay | Admitting: Physician Assistant

## 2016-10-03 VITALS — BP 152/98 | HR 71 | Temp 98.1°F | Resp 14 | Ht 72.0 in | Wt >= 6400 oz

## 2016-10-03 DIAGNOSIS — M7702 Medial epicondylitis, left elbow: Secondary | ICD-10-CM | POA: Diagnosis not present

## 2016-10-03 MED ORDER — MELOXICAM 15 MG PO TABS: 15.0000 mg | ORAL_TABLET | Freq: Every day | ORAL | 0 refills | Status: DC

## 2016-10-03 NOTE — Progress Notes (Signed)
Pre visit review using our clinic review tool, if applicable. No additional management support is needed unless otherwise documented below in the visit note. 

## 2016-10-03 NOTE — Progress Notes (Signed)
Patient presents to clinic today c/o 4 days of L elbow pain starting as a soreness not more of an aching pain. More significant today. Denies trauma or injury. Denies numbness or tingling of extremity. Notes a tight feeling when extending and flexing elbow. Denies any prior injury to extremity. Denies swelling or redness. Denies fever, chills or malaise. Has not taken anything for symptoms.   Past Medical History:  Diagnosis Date  . Arthritis    IN KNEES  . Cellulitis 10/26/2013   LOWER RT EXTREMITY  . CELLULITIS/ABSCESS, LEG 06/27/2007  . COLONIC POLYPS   . CONGESTIVE HEART FAILURE   . DIABETES MELLITUS, TYPE II   . DIVERTICULOSIS, COLON   . ERECTILE DYSFUNCTION, ORGANIC   . GOUT NOS   . Headache   . HYPERLIPIDEMIA   . HYPERTENSION   . LOW BACK PAIN   . Morbid obesity ([place])   . SLEEP APNEA, OBSTRUCTIVE    USES CPAP    Current Outpatient Prescriptions on File Prior to Visit  Medication Sig Dispense Refill  . amLODipine (NORVASC) 10 MG tablet TAKE 1 TABLET (10 MG TOTAL) BY MOUTH DAILY. 90 tablet 1  . atorvastatin (LIPITOR) 10 MG tablet TAKE 1 TABLET (10 MG TOTAL) BY MOUTH DAILY. 90 tablet 1  . bimatoprost (LUMIGAN) 0.01 % SOLN Place 1 drop into both eyes at bedtime.    . brimonidine-timolol (COMBIGAN) 0.2-0.5 % ophthalmic solution Place 1 drop into the right eye 2 (two) times daily.    . brinzolamide (AZOPT) 1 % ophthalmic suspension Place 1 drop into both eyes 2 (two) times daily.    . furosemide (LASIX) 80 MG tablet Take 1 tablet (80 mg total) by mouth 2 (two) times daily. 60 tablet 11  . glipiZIDE (GLUCOTROL XL) 5 MG 24 hr tablet Take 1 tablet (5 mg total) by mouth daily with breakfast. 90 tablet 3  . glucose blood (ONE TOUCH ULTRA TEST) test strip Use as directed once daily to check blood sugar.  Diagnosis code 250.02 100 each 11  . losartan (COZAAR) 50 MG tablet Take 1 tablet (50 mg total) by mouth daily. 90 tablet 3  . metFORMIN (GLUCOPHAGE-XR) 500 MG 24 hr tablet TAKE 4  TABLETS BY MOUTH PER DAY 360 tablet 3  . potassium chloride SA (K-DUR,KLOR-CON) 20 MEQ tablet Take 1 tablet (20 mEq total) by mouth daily. 30 tablet 11  . tadalafil (CIALIS) 20 MG tablet Take 1 tablet (20 mg total) by mouth daily as needed for erectile dysfunction. 5 tablet 11   No current facility-administered medications on file prior to visit.     Allergies  Allergen Reactions  . Penicillins Other (See Comments)    Unknown childhood allergic reaction    Family History  Problem Relation Age of Onset  . Cardiomyopathy Father   . Asthma Other     Social History   Social History  . Marital status: Married    Spouse name: N/A  . Number of children: N/A  . Years of education: N/A   Social History Main Topics  . Smoking status: Never Smoker  . Smokeless tobacco: Never Used  . Alcohol use 1.2 oz/week    2 Cans of beer per week     Comment: 6 beers a week  . Drug use: No  . Sexual activity: Not on file   Other Topics Concern  . Not on file   Social History Narrative  . No narrative on file   Review of Systems - See HPI.  All other ROS are negative.  Pulse 71   Temp 98.1 F (36.7 C) (Oral)   Resp 14   Ht 6' (1.829 m)   Wt (!) 411 lb (186.4 kg)   SpO2 97%   BMI 55.74 kg/m   Physical Exam  Constitutional: He is oriented to person, place, and time and well-developed, well-nourished, and in no distress.  HENT:  Head: Normocephalic and atraumatic.  Eyes: Conjunctivae are normal.  Neck: Neck supple.  Cardiovascular: Normal rate, regular rhythm, normal heart sounds and intact distal pulses.   Musculoskeletal:       Left forearm: He exhibits tenderness. He exhibits no swelling.  Tenderness over medial epicondylitis without swelling. ROM within normal limits but reproduces pain. No erythema or warmth noted.   Neurological: He is alert and oriented to person, place, and time.  Skin: Skin is warm and dry.  Vitals reviewed.  Assessment/Plan: 1. Medial epicondylitis  of elbow, left Tenderness with palpation. RICE. Rx mobic once daily over next week. Tylenol for breakthrough pain. Compression brace discussed. FU if not improving.   Leeanne Rio, PA-C

## 2016-10-03 NOTE — Patient Instructions (Signed)
Please ice and elevate extremity.  Take medication as directed with food. Use Tylenol if needed for breakthrough pain. Get the brace we discussed to add compression to the area to alleviate pain.  Follow-up with your primary care provider if not improving over the next few days.

## 2016-11-11 ENCOUNTER — Other Ambulatory Visit (INDEPENDENT_AMBULATORY_CARE_PROVIDER_SITE_OTHER): Payer: BLUE CROSS/BLUE SHIELD

## 2016-11-11 DIAGNOSIS — E119 Type 2 diabetes mellitus without complications: Secondary | ICD-10-CM | POA: Diagnosis not present

## 2016-11-11 DIAGNOSIS — Z0001 Encounter for general adult medical examination with abnormal findings: Secondary | ICD-10-CM

## 2016-11-11 LAB — HEPATIC FUNCTION PANEL
ALT: 21 U/L (ref 0–53)
AST: 13 U/L (ref 0–37)
Albumin: 3.5 g/dL (ref 3.5–5.2)
Alkaline Phosphatase: 77 U/L (ref 39–117)
BILIRUBIN DIRECT: 0.1 mg/dL (ref 0.0–0.3)
BILIRUBIN TOTAL: 0.6 mg/dL (ref 0.2–1.2)
Total Protein: 6.7 g/dL (ref 6.0–8.3)

## 2016-11-11 LAB — LIPID PANEL
CHOL/HDL RATIO: 3
Cholesterol: 143 mg/dL (ref 0–200)
HDL: 46.1 mg/dL (ref >39.00)
LDL Cholesterol: 76 mg/dL (ref 0–99)
NONHDL: 96.49
Triglycerides: 101 mg/dL (ref 0.0–149.0)
VLDL: 20.2 mg/dL (ref 0.0–40.0)

## 2016-11-11 LAB — CBC WITH DIFFERENTIAL/PLATELET
BASOS PCT: 0.5 % (ref 0.0–3.0)
Basophils Absolute: 0 10*3/uL (ref 0.0–0.1)
Eosinophils Absolute: 0.3 10*3/uL (ref 0.0–0.7)
Eosinophils Relative: 3.6 % (ref 0.0–5.0)
HEMATOCRIT: 37.6 % — AB (ref 39.0–52.0)
Hemoglobin: 12.9 g/dL — ABNORMAL LOW (ref 13.0–17.0)
LYMPHS PCT: 9.3 % — AB (ref 12.0–46.0)
Lymphs Abs: 0.8 10*3/uL (ref 0.7–4.0)
MCHC: 34.2 g/dL (ref 30.0–36.0)
MCV: 89.8 fl (ref 78.0–100.0)
MONOS PCT: 9.2 % (ref 3.0–12.0)
Monocytes Absolute: 0.8 10*3/uL (ref 0.1–1.0)
NEUTROS ABS: 6.7 10*3/uL (ref 1.4–7.7)
Neutrophils Relative %: 77.4 % — ABNORMAL HIGH (ref 43.0–77.0)
PLATELETS: 254 10*3/uL (ref 150.0–400.0)
RBC: 4.19 Mil/uL — ABNORMAL LOW (ref 4.22–5.81)
RDW: 13.5 % (ref 11.5–15.5)
WBC: 8.7 10*3/uL (ref 4.0–10.5)

## 2016-11-11 LAB — URINALYSIS, ROUTINE W REFLEX MICROSCOPIC
Bilirubin Urine: NEGATIVE
Ketones, ur: NEGATIVE
Leukocytes, UA: NEGATIVE
Nitrite: NEGATIVE
PH: 5.5 (ref 5.0–8.0)
SPECIFIC GRAVITY, URINE: 1.025 (ref 1.000–1.030)
Total Protein, Urine: >=300 — AB
UROBILINOGEN UA: 0.2 (ref 0.0–1.0)
Urine Glucose: NEGATIVE

## 2016-11-11 LAB — BASIC METABOLIC PANEL
BUN: 16 mg/dL (ref 6–23)
CALCIUM: 9 mg/dL (ref 8.4–10.5)
CHLORIDE: 105 meq/L (ref 96–112)
CO2: 30 mEq/L (ref 19–32)
CREATININE: 0.98 mg/dL (ref 0.40–1.50)
GFR: 82.4 mL/min (ref >60.00)
Glucose, Bld: 212 mg/dL — ABNORMAL HIGH (ref 70–99)
Potassium: 4.8 mEq/L (ref 3.5–5.1)
SODIUM: 140 meq/L (ref 135–145)

## 2016-11-11 LAB — MICROALBUMIN / CREATININE URINE RATIO
Creatinine,U: 136.6 mg/dL
MICROALB/CREAT RATIO: 219.6 mg/g — AB (ref 0.0–30.0)

## 2016-11-11 LAB — PSA: PSA: 0.43 ng/mL (ref 0.10–4.00)

## 2016-11-11 LAB — HEMOGLOBIN A1C: Hgb A1c MFr Bld: 6.9 % — ABNORMAL HIGH (ref 4.6–6.5)

## 2016-11-11 LAB — TSH: TSH: 2.56 u[IU]/mL (ref 0.35–4.50)

## 2016-11-17 ENCOUNTER — Other Ambulatory Visit: Payer: Self-pay | Admitting: Internal Medicine

## 2016-11-17 ENCOUNTER — Encounter: Payer: Self-pay | Admitting: Internal Medicine

## 2016-11-17 ENCOUNTER — Ambulatory Visit (INDEPENDENT_AMBULATORY_CARE_PROVIDER_SITE_OTHER): Payer: BLUE CROSS/BLUE SHIELD | Admitting: Internal Medicine

## 2016-11-17 ENCOUNTER — Other Ambulatory Visit: Payer: BLUE CROSS/BLUE SHIELD

## 2016-11-17 VITALS — BP 126/82 | HR 78 | Temp 97.6°F | Ht 72.0 in | Wt >= 6400 oz

## 2016-11-17 DIAGNOSIS — Z86010 Personal history of colonic polyps: Secondary | ICD-10-CM | POA: Diagnosis not present

## 2016-11-17 DIAGNOSIS — Z0001 Encounter for general adult medical examination with abnormal findings: Secondary | ICD-10-CM

## 2016-11-17 DIAGNOSIS — E119 Type 2 diabetes mellitus without complications: Secondary | ICD-10-CM | POA: Diagnosis not present

## 2016-11-17 DIAGNOSIS — Z0000 Encounter for general adult medical examination without abnormal findings: Secondary | ICD-10-CM | POA: Diagnosis not present

## 2016-11-17 DIAGNOSIS — R809 Proteinuria, unspecified: Secondary | ICD-10-CM | POA: Diagnosis not present

## 2016-11-17 NOTE — Progress Notes (Signed)
Subjective:    Patient ID: Gerald Day, male    DOB: 1956-06-26, 61 y.o.   MRN: [id number]  HPI  Here for wellness and f/u;  Overall doing ok;  Pt denies Chest pain, worsening SOB, DOE, wheezing, orthopnea, PND, worsening LE edema, palpitations, dizziness or syncope.  Pt denies neurological change such as new headache, facial or extremity weakness.  Pt denies polydipsia, polyuria, or low sugar symptoms. Pt states overall good compliance with treatment and medications, good tolerability, and has been trying to follow appropriate diet.  Pt denies worsening depressive symptoms, suicidal ideation or panic. No fever, night sweats, wt loss, loss of appetite, or other constitutional symptoms.  Pt states good ability with ADL's, has low fall risk, home safety reviewed and adequate, no other significant changes in hearing or vision, and only occasionally active with exercise. No other new exam findings Past Medical History:  Diagnosis Date  . Arthritis    IN KNEES  . Cellulitis 10/26/2013   LOWER RT EXTREMITY  . CELLULITIS/ABSCESS, LEG 06/27/2007  . COLONIC POLYPS   . CONGESTIVE HEART FAILURE   . DIABETES MELLITUS, TYPE II   . DIVERTICULOSIS, COLON   . ERECTILE DYSFUNCTION, ORGANIC   . GOUT NOS   . Headache   . HYPERLIPIDEMIA   . HYPERTENSION   . LOW BACK PAIN   . Morbid obesity ([place])   . SLEEP APNEA, OBSTRUCTIVE    USES CPAP   Past Surgical History:  Procedure Laterality Date  . APPENDECTOMY    . FOOT SURGERY     LT  . TENOTOMY / FLEXOR TENDON TRANSFER Right 03/15/2015   Procedure: TENOTOMY HT REPAIR/ULCER DEBRIDEMENT/GRAFT PREP/ACELL GRAFT APPLICATION;  Surgeon: Jana Half, DPM;  Location: [facility];  Service: Podiatry;  Laterality: Right;  HALLUX    reports that he has never smoked. He has never used smokeless tobacco. He reports that he drinks about 1.2 oz of alcohol per week . He reports that he does not use drugs. family history includes Asthma in his other; Cardiomyopathy in his  father. Allergies  Allergen Reactions  . Penicillins Other (See Comments)    Unknown childhood allergic reaction   Current Outpatient Prescriptions on File Prior to Visit  Medication Sig Dispense Refill  . amLODipine (NORVASC) 10 MG tablet TAKE 1 TABLET (10 MG TOTAL) BY MOUTH DAILY. 90 tablet 1  . atorvastatin (LIPITOR) 10 MG tablet TAKE 1 TABLET (10 MG TOTAL) BY MOUTH DAILY. 90 tablet 1  . bimatoprost (LUMIGAN) 0.01 % SOLN Place 1 drop into both eyes at bedtime.    . brimonidine-timolol (COMBIGAN) 0.2-0.5 % ophthalmic solution Place 1 drop into the right eye 2 (two) times daily.    . brinzolamide (AZOPT) 1 % ophthalmic suspension Place 1 drop into both eyes 2 (two) times daily.    Marland Kitchen glipiZIDE (GLUCOTROL XL) 5 MG 24 hr tablet Take 1 tablet (5 mg total) by mouth daily with breakfast. 90 tablet 3  . glucose blood (ONE TOUCH ULTRA TEST) test strip Use as directed once daily to check blood sugar.  Diagnosis code 250.02 100 each 11  . losartan (COZAAR) 50 MG tablet Take 1 tablet (50 mg total) by mouth daily. 90 tablet 3  . meloxicam (MOBIC) 15 MG tablet Take 1 tablet (15 mg total) by mouth daily. 10 tablet 0  . metFORMIN (GLUCOPHAGE-XR) 500 MG 24 hr tablet TAKE 4 TABLETS BY MOUTH PER DAY 360 tablet 3  . potassium chloride SA (K-DUR,KLOR-CON) 20 MEQ tablet Take  1 tablet (20 mEq total) by mouth daily. 30 tablet 11  . tadalafil (CIALIS) 20 MG tablet Take 1 tablet (20 mg total) by mouth daily as needed for erectile dysfunction. 5 tablet 11   No current facility-administered medications on file prior to visit.    Review of Systems Constitutional: Negative for increased diaphoresis, or other activity, appetite or siginficant weight change other than noted HENT: Negative for worsening hearing loss, ear pain, facial swelling, mouth sores and neck stiffness.   Eyes: Negative for other worsening pain, redness or visual disturbance.  Respiratory: Negative for choking or stridor Cardiovascular: Negative  for other chest pain and palpitations.  Gastrointestinal: Negative for worsening diarrhea, blood in stool, or abdominal distention Genitourinary: Negative for hematuria, flank pain or change in urine volume.  Musculoskeletal: Negative for myalgias or other joint complaints.  Skin: Negative for other color change and wound or drainage.  Neurological: Negative for syncope and numbness. other than noted Hematological: Negative for adenopathy. or other swelling Psychiatric/Behavioral: Negative for hallucinations, SI, self-injury, decreased concentration or other worsening agitation.  All other system neg per pt    Objective:   Physical Exam BP 126/82   Pulse 78   Temp 97.6 F (36.4 C)   Ht 6' (1.829 m)   Wt (!) 414 lb (187.8 kg)   SpO2 99%   BMI 56.15 kg/m  VS noted,  Constitutional: Pt is oriented to person, place, and time. Appears well-developed and well-nourished, in no significant distress Head: Normocephalic and atraumatic  Eyes: Conjunctivae and EOM are normal. Pupils are equal, round, and reactive to light Right Ear: External ear normal.  Left Ear: External ear normal Nose: Nose normal.  Mouth/Throat: Oropharynx is clear and moist  Neck: Normal range of motion. Neck supple. No JVD present. No tracheal deviation present or significant neck LA or mass Cardiovascular: Normal rate, regular rhythm, normal heart sounds and intact distal pulses.   Pulmonary/Chest: Effort normal and breath sounds without rales or wheezing  Abdominal: Soft. Bowel sounds are normal. NT. No HSM  Musculoskeletal: Normal range of motion. Exhibits chronic 1-2+ bilat LE edema Lymphadenopathy: Has no cervical adenopathy.  Neurological: Pt is alert and oriented to person, place, and time. Pt has normal reflexes. No cranial nerve deficit. Motor grossly intact Skin: Skin is warm and dry. No rash noted or new ulcers Psychiatric:  Has normal mood and affect. Behavior is normal.  No other exam findings  ECG  today I have personally interpreted Sinus rhythm - no acute changes     Assessment & Plan:

## 2016-11-17 NOTE — Patient Instructions (Addendum)
Please continue all other medications as before, and refills have been done if requested.  Please have the pharmacy call with any other refills you may need.  Please continue your efforts at being more active, low cholesterol diet, and weight control.  You are otherwise up to date with prevention measures today.  Please keep your appointments with your specialists as you may have planned  You will be contacted regarding the referral for: colonoscopy  Please go to the LAB in the Basement (turn left off the elevator) for the tests to be done today - for the urine test only  You will be contacted by phone if any changes need to be made immediately.  Otherwise, you will receive a letter about your results with an explanation, but please check with MyChart first.  Please remember to sign up for MyChart if you have not done so, as this will be important to you in the future with finding out test results, communicating by private email, and scheduling acute appointments online when needed.  Please return in 6 months, or sooner if needed, with Lab testing done 3-5 days before

## 2016-11-22 DIAGNOSIS — R809 Proteinuria, unspecified: Secondary | ICD-10-CM | POA: Insufficient documentation

## 2016-11-22 NOTE — Assessment & Plan Note (Signed)
For colonoscopy 

## 2016-11-22 NOTE — Assessment & Plan Note (Signed)

## 2016-11-22 NOTE — Assessment & Plan Note (Signed)
For 24 hr urine

## 2016-11-22 NOTE — Assessment & Plan Note (Signed)
stable overall by history and exam, recent data reviewed with pt, and pt to continue medical treatment as before,  to f/u any worsening symptoms or concerns Lab Results  Component Value Date   HGBA1C 6.9 (H) 11/11/2016   

## 2016-11-23 ENCOUNTER — Other Ambulatory Visit: Payer: BLUE CROSS/BLUE SHIELD

## 2016-11-23 ENCOUNTER — Other Ambulatory Visit: Payer: Self-pay | Admitting: Internal Medicine

## 2016-11-23 DIAGNOSIS — R809 Proteinuria, unspecified: Secondary | ICD-10-CM

## 2016-11-24 ENCOUNTER — Encounter: Payer: Self-pay | Admitting: Internal Medicine

## 2016-11-24 LAB — PROTEIN, URINE, 24 HOUR
PROTEIN 24H UR: 10063 mg/(24.h) — AB (ref <150)
PROTEIN, URINE: 347 mg/dL — AB (ref 5–25)

## 2016-11-24 NOTE — Progress Notes (Signed)
Called patient. Phone rings, no VM.

## 2016-11-30 ENCOUNTER — Other Ambulatory Visit: Payer: Self-pay | Admitting: Internal Medicine

## 2017-01-11 ENCOUNTER — Encounter: Payer: Self-pay | Admitting: Internal Medicine

## 2017-01-16 ENCOUNTER — Other Ambulatory Visit: Payer: Self-pay | Admitting: Internal Medicine

## 2017-04-14 ENCOUNTER — Other Ambulatory Visit: Payer: Self-pay | Admitting: Internal Medicine

## 2017-05-14 ENCOUNTER — Ambulatory Visit (INDEPENDENT_AMBULATORY_CARE_PROVIDER_SITE_OTHER): Payer: BLUE CROSS/BLUE SHIELD | Admitting: Internal Medicine

## 2017-05-14 ENCOUNTER — Telehealth: Payer: Self-pay | Admitting: Internal Medicine

## 2017-05-14 ENCOUNTER — Encounter: Payer: Self-pay | Admitting: Internal Medicine

## 2017-05-14 VITALS — BP 164/104 | HR 80 | Temp 97.9°F | Ht 72.0 in | Wt >= 6400 oz

## 2017-05-14 DIAGNOSIS — I10 Essential (primary) hypertension: Secondary | ICD-10-CM | POA: Diagnosis not present

## 2017-05-14 DIAGNOSIS — M545 Low back pain, unspecified: Secondary | ICD-10-CM

## 2017-05-14 DIAGNOSIS — E119 Type 2 diabetes mellitus without complications: Secondary | ICD-10-CM | POA: Diagnosis not present

## 2017-05-14 DIAGNOSIS — G8929 Other chronic pain: Secondary | ICD-10-CM | POA: Insufficient documentation

## 2017-05-14 MED ORDER — TRAMADOL HCL 50 MG PO TABS: 50.0000 mg | ORAL_TABLET | Freq: Three times a day (TID) | ORAL | 0 refills | Status: DC | PRN

## 2017-05-14 MED ORDER — PREDNISONE 10 MG PO TABS: ORAL_TABLET | ORAL | 0 refills | Status: DC

## 2017-05-14 MED ORDER — CYCLOBENZAPRINE HCL 5 MG PO TABS: 5.0000 mg | ORAL_TABLET | Freq: Three times a day (TID) | ORAL | 1 refills | Status: DC | PRN

## 2017-05-14 NOTE — Patient Instructions (Signed)
Please take all new medication as prescribed - the pain medication, muscle relaxer, and prednisone  Please continue all other medications as before, and refills have been done if requested.  Please have the pharmacy call with any other refills you may need.  Please keep your appointments with your specialists as you may have planned

## 2017-05-14 NOTE — Telephone Encounter (Signed)
Pt called stating he pulled a muscle in his lower back and would like a muscle relaxer or pain medication called in. Was informed he may need to come in for an appointment. He already has an appointment for 9/13 Please advise

## 2017-05-14 NOTE — Progress Notes (Signed)
Subjective:    Patient ID: Gerald Day, male    DOB: [birth year], 61 y.o.   MRN: [id number]  HPI  Here with flare of lower back pain he tends to get every 6-12 months, similar to previous but just too painful to work today, had to take off work, may have started with increased bending and lifting at work but not clear, symptoms now 1 day onset, lower back in the midline and across the lower back as well without other radiation, constant, severe, worse to bend or stand up.  Pt denies bowel or bladder change, fever, wt loss,  worsening LE pain/numbness/weakness,  or falls, just slow to ambulate due to pain and stiffness.. Pt denies chest pain, increased sob or doe, wheezing, orthopnea, PND, increased LE swelling, palpitations, dizziness or syncope.  Pt denies new neurological symptoms such as new headache, or facial or extremity weakness or numbness   Pt denies polydipsia, polyuria.   Past Medical History:  Diagnosis Date  . Arthritis    IN KNEES  . Cellulitis 10/26/2013   LOWER RT EXTREMITY  . CELLULITIS/ABSCESS, LEG 06/27/2007  . COLONIC POLYPS   . CONGESTIVE HEART FAILURE   . DIABETES MELLITUS, TYPE II   . DIVERTICULOSIS, COLON   . ERECTILE DYSFUNCTION, ORGANIC   . GOUT NOS   . Headache   . HYPERLIPIDEMIA   . HYPERTENSION   . LOW BACK PAIN   . Morbid obesity ([place])   . SLEEP APNEA, OBSTRUCTIVE    USES CPAP   Past Surgical History:  Procedure Laterality Date  . APPENDECTOMY    . FOOT SURGERY     LT  . TENOTOMY / FLEXOR TENDON TRANSFER Right 03/15/2015   Procedure: TENOTOMY HT REPAIR/ULCER DEBRIDEMENT/GRAFT PREP/ACELL GRAFT APPLICATION;  Surgeon: Jana Half, DPM;  Location: [facility];  Service: Podiatry;  Laterality: Right;  HALLUX    reports that he has never smoked. He has never used smokeless tobacco. He reports that he drinks about 1.2 oz of alcohol per week . He reports that he does not use drugs. family history includes Asthma in his other; Cardiomyopathy in his  father. Allergies  Allergen Reactions  . Penicillins Other (See Comments)    Unknown childhood allergic reaction   Current Outpatient Prescriptions on File Prior to Visit  Medication Sig Dispense Refill  . amLODipine (NORVASC) 10 MG tablet TAKE 1 TABLET (10 MG TOTAL) BY MOUTH DAILY. 90 tablet 1  . atorvastatin (LIPITOR) 10 MG tablet TAKE 1 TABLET (10 MG TOTAL) BY MOUTH DAILY. 90 tablet 1  . bimatoprost (LUMIGAN) 0.01 % SOLN Place 1 drop into both eyes at bedtime.    . brimonidine-timolol (COMBIGAN) 0.2-0.5 % ophthalmic solution Place 1 drop into the right eye 2 (two) times daily.    . brinzolamide (AZOPT) 1 % ophthalmic suspension Place 1 drop into both eyes 2 (two) times daily.    . furosemide (LASIX) 80 MG tablet Take 1 tablet (80 mg total) by mouth 2 (two) times daily. 60 tablet 4  . glipiZIDE (GLUCOTROL XL) 5 MG 24 hr tablet TAKE 1 TABLET BY MOUTH EVERY DAY WITH BREAKFAST 90 tablet 3  . glucose blood (ONE TOUCH ULTRA TEST) test strip Use as directed once daily to check blood sugar.  Diagnosis code 250.02 100 each 11  . KLOR-CON M20 20 MEQ tablet TAKE 1 TABLET (20 MEQ TOTAL) BY MOUTH DAILY. 30 tablet 8  . losartan (COZAAR) 50 MG tablet Take 1 tablet (50 mg total) by mouth  daily. 90 tablet 3  . meloxicam (MOBIC) 15 MG tablet Take 1 tablet (15 mg total) by mouth daily. 10 tablet 0  . metFORMIN (GLUCOPHAGE-XR) 500 MG 24 hr tablet TAKE 4 TABLETS BY MOUTH PER DAY 360 tablet 3  . tadalafil (CIALIS) 20 MG tablet Take 1 tablet (20 mg total) by mouth daily as needed for erectile dysfunction. 5 tablet 11   No current facility-administered medications on file prior to visit.    Review of Systems  Constitutional: Negative for other unusual diaphoresis or sweats HENT: Negative for ear discharge or swelling Eyes: Negative for other worsening visual disturbances Respiratory: Negative for stridor or other swelling  Gastrointestinal: Negative for worsening distension or other blood Genitourinary:  Negative for retention or other urinary change Musculoskeletal: Negative for other MSK pain or swelling Skin: Negative for color change or other new lesions Neurological: Negative for worsening tremors and other numbness  Psychiatric/Behavioral: Negative for worsening agitation or other fatigue All other system neg per pt    Objective:   Physical Exam BP (!) 164/104   Pulse 80   Temp 97.9 F (36.6 C) (Oral)   Ht 6' (1.829 m)   Wt (!) 404 lb (183.3 kg)   SpO2 98%   BMI 54.79 kg/m  VS noted, supermorbid obese, non toxic Constitutional: Pt appears in NAD HENT: Head: NCAT.  Right Ear: External ear normal.  Left Ear: External ear normal.  Eyes: . Pupils are equal, round, and reactive to light. Conjunctivae and EOM are normal Nose: without d/c or deformity Neck: Neck supple. Gross normal ROM Cardiovascular: Normal rate and regular rhythm.   Pulmonary/Chest: Effort normal and breath sounds without rales or wheezing.  Abd:  Soft, NT, ND, + BS, no organomegaly Spine NT to palpate in the midline lumbar or paravertebral Neurological: Pt is alert. At baseline orientation, motor 5/5 intact Skin: Skin is warm. No rashes, other new lesions, no LE edema Psychiatric: Pt behavior is normal without agitation  No other exam findings    Assessment & Plan:

## 2017-05-15 NOTE — Assessment & Plan Note (Signed)
Mild to mod uncontrolled, likely reactive, cont same tx,  to f/u any worsening symptoms or concerns

## 2017-05-15 NOTE — Assessment & Plan Note (Signed)
stable overall by history and exam, recent data reviewed with pt, and pt to continue medical treatment as before,  to f/u any worsening symptoms or concerns Lab Results  Component Value Date   HGBA1C 6.9 (H) 11/11/2016  pt to call for onset polys or cbg > 200 with tx

## 2017-05-15 NOTE — Assessment & Plan Note (Signed)
Exam c/w msk strain vs flare of pain related to possible underlying lumbar DJD/DDD; for heat pad prn, rest but no prolonged bedrest, muscle relaxer , pain control, and predpac asd

## 2017-05-16 ENCOUNTER — Other Ambulatory Visit: Payer: Self-pay | Admitting: Internal Medicine

## 2017-05-19 ENCOUNTER — Telehealth: Payer: Self-pay | Admitting: Internal Medicine

## 2017-05-19 ENCOUNTER — Encounter: Payer: Self-pay | Admitting: Internal Medicine

## 2017-05-19 ENCOUNTER — Ambulatory Visit (INDEPENDENT_AMBULATORY_CARE_PROVIDER_SITE_OTHER): Payer: BLUE CROSS/BLUE SHIELD | Admitting: Internal Medicine

## 2017-05-19 VITALS — BP 156/100 | HR 84 | Temp 97.8°F | Ht 72.0 in | Wt >= 6400 oz

## 2017-05-19 DIAGNOSIS — M545 Low back pain, unspecified: Secondary | ICD-10-CM

## 2017-05-19 DIAGNOSIS — I10 Essential (primary) hypertension: Secondary | ICD-10-CM | POA: Diagnosis not present

## 2017-05-19 DIAGNOSIS — R809 Proteinuria, unspecified: Secondary | ICD-10-CM

## 2017-05-19 DIAGNOSIS — E119 Type 2 diabetes mellitus without complications: Secondary | ICD-10-CM

## 2017-05-19 MED ORDER — LOSARTAN POTASSIUM 50 MG PO TABS: 100.0000 mg | ORAL_TABLET | Freq: Every day | ORAL | 3 refills | Status: DC

## 2017-05-19 MED ORDER — HYDROCODONE-ACETAMINOPHEN 7.5-325 MG PO TABS: 1.0000 | ORAL_TABLET | Freq: Four times a day (QID) | ORAL | 0 refills | Status: DC | PRN

## 2017-05-19 MED ORDER — CYCLOBENZAPRINE HCL 10 MG PO TABS: 10.0000 mg | ORAL_TABLET | Freq: Three times a day (TID) | ORAL | 0 refills | Status: DC | PRN

## 2017-05-19 NOTE — Patient Instructions (Addendum)
You will be contacted regarding the referral [id number]  Ok to increase the losartan to 100 mg per day  Please take all new medication as prescribed - the hydrocodone, and the flexeril to 10 mg, and finish the prednisone  Hopefully you will be able to return to work on Monday Sept 17  Please continue all other medications as before, and refills have been done if requested.  Please have the pharmacy call with any other refills you may need.  Please continue your efforts at being more active, low cholesterol diabetic diet, and weight control.

## 2017-05-19 NOTE — Progress Notes (Signed)
Subjective:    Patient ID: Gerald Day, male    DOB: 03/30/1956, 61 y.o.   MRN: [id number]  HPI  Here to f/u with c/o lower back pain persists more severe and prolonged than he expected as of last visit, with stiffness and pain still significant worse to stand up from sitting, but more prolonged standing and walking not worse.  Pain was 9/10, now to 7/10, only somewhat improved with time, rest, flexeril and predpac.  Pt continues to deny bowel or bladder change, fever, wt loss,  worsening LE pain/numbness/weakness, gait change or falls.  Is not asking for work excuse, as this is not required by his employer and he is wanting to get back to work by next week.  Pt denies chest pain, increased sob or doe, wheezing, orthopnea, PND, increased LE swelling, palpitations, dizziness or syncope.  Denies worsening reflux, abd pain, dysphagia, n/v, bowel change or blood.   Pt denies polydipsia, polyuria, Past Medical History:  Diagnosis Date  . Arthritis    IN KNEES  . Cellulitis 10/26/2013   LOWER RT EXTREMITY  . CELLULITIS/ABSCESS, LEG 06/27/2007  . COLONIC POLYPS   . CONGESTIVE HEART FAILURE   . DIABETES MELLITUS, TYPE II   . DIVERTICULOSIS, COLON   . ERECTILE DYSFUNCTION, ORGANIC   . GOUT NOS   . Headache   . HYPERLIPIDEMIA   . HYPERTENSION   . LOW BACK PAIN   . Morbid obesity ([place])   . SLEEP APNEA, OBSTRUCTIVE    USES CPAP   Past Surgical History:  Procedure Laterality Date  . APPENDECTOMY    . FOOT SURGERY     LT  . TENOTOMY / FLEXOR TENDON TRANSFER Right 03/15/2015   Procedure: TENOTOMY HT REPAIR/ULCER DEBRIDEMENT/GRAFT PREP/ACELL GRAFT APPLICATION;  Surgeon: Jana Half, DPM;  Location: [facility];  Service: Podiatry;  Laterality: Right;  HALLUX    reports that he has never smoked. He has never used smokeless tobacco. He reports that he drinks about 1.2 oz of alcohol per week . He reports that he does not use drugs. family history includes Asthma in his other; Cardiomyopathy in his  father. Allergies  Allergen Reactions  . Penicillins Other (See Comments)    Unknown childhood allergic reaction   Current Outpatient Prescriptions on File Prior to Visit  Medication Sig Dispense Refill  . amLODipine (NORVASC) 10 MG tablet TAKE 1 TABLET (10 MG TOTAL) BY MOUTH DAILY. 90 tablet 1  . atorvastatin (LIPITOR) 10 MG tablet TAKE 1 TABLET (10 MG TOTAL) BY MOUTH DAILY. 90 tablet 1  . bimatoprost (LUMIGAN) 0.01 % SOLN Place 1 drop into both eyes at bedtime.    . brimonidine-timolol (COMBIGAN) 0.2-0.5 % ophthalmic solution Place 1 drop into the right eye 2 (two) times daily.    . brinzolamide (AZOPT) 1 % ophthalmic suspension Place 1 drop into both eyes 2 (two) times daily.    . furosemide (LASIX) 80 MG tablet Take 1 tablet (80 mg total) by mouth 2 (two) times daily. 60 tablet 4  . glipiZIDE (GLUCOTROL XL) 5 MG 24 hr tablet TAKE 1 TABLET BY MOUTH EVERY DAY WITH BREAKFAST 90 tablet 3  . glucose blood (ONE TOUCH ULTRA TEST) test strip Use as directed once daily to check blood sugar.  Diagnosis code 250.02 100 each 11  . KLOR-CON M20 20 MEQ tablet TAKE 1 TABLET (20 MEQ TOTAL) BY MOUTH DAILY. 30 tablet 8  . meloxicam (MOBIC) 15 MG tablet Take 1 tablet (15 mg total) by  mouth daily. 10 tablet 0  . predniSONE (DELTASONE) 10 MG tablet 2 tabs by mouth per day for 5 days 10 tablet 0  . traMADol (ULTRAM) 50 MG tablet Take 1 tablet (50 mg total) by mouth every 8 (eight) hours as needed. 60 tablet 0  . tadalafil (CIALIS) 20 MG tablet Take 1 tablet (20 mg total) by mouth daily as needed for erectile dysfunction. 5 tablet 11   No current facility-administered medications on file prior to visit.    Review of Systems  Constitutional: Negative for other unusual diaphoresis or sweats HENT: Negative for ear discharge or swelling Eyes: Negative for other worsening visual disturbances Respiratory: Negative for stridor or other swelling  Gastrointestinal: Negative for worsening distension or other  blood Genitourinary: Negative for retention or other urinary change Musculoskeletal: Negative for other MSK pain or swelling Skin: Negative for color change or other new lesions Neurological: Negative for worsening tremors and other numbness  Psychiatric/Behavioral: Negative for worsening agitation or other fatigue All other system neg per pt    Objective:   Physical Exam BP (!) 156/100   Pulse 84   Temp 97.8 F (36.6 C) (Oral)   Ht 6' (1.829 m)   Wt (!) 402 lb (182.3 kg)   SpO2 100%   BMI 54.52 kg/m  VS noted, supermorbid obese Constitutional: Pt appears in NAD HENT: Head: NCAT.  Right Ear: External ear normal.  Left Ear: External ear normal.  Eyes: . Pupils are equal, round, and reactive to light. Conjunctivae and EOM are normal Nose: without d/c or deformity Neck: Neck supple. Gross normal ROM Cardiovascular: Normal rate and regular rhythm.   Pulmonary/Chest: Effort normal and breath sounds without rales or wheezing.  Abd:  Soft, NT, ND, + BS, no organomegaly Spine:  Diffuse lower midline lumbar tender without paravertebral tender or spasm Neurological: Pt is alert. At baseline orientation, motor grossly intact Skin: Skin is warm. No rashes, other new lesions, no LE edema Psychiatric: Pt behavior is normal without agitation  No other exam findings Lab Results  Component Value Date   WBC 8.7 11/11/2016   HGB 12.9 (L) 11/11/2016   HCT 37.6 (L) 11/11/2016   PLT 254.0 11/11/2016   GLUCOSE 212 (H) 11/11/2016   CHOL 143 11/11/2016   TRIG 101.0 11/11/2016   HDL 46.10 11/11/2016   LDLDIRECT 107.6 05/08/2014   LDLCALC 76 11/11/2016   ALT 21 11/11/2016   AST 13 11/11/2016   NA 140 11/11/2016   K 4.8 11/11/2016   CL 105 11/11/2016   CREATININE 0.98 11/11/2016   BUN 16 11/11/2016   CO2 30 11/11/2016   TSH 2.56 11/11/2016   PSA 0.43 11/11/2016   HGBA1C 6.9 (H) 11/11/2016   MICROALBUR >300.0 (H) 11/11/2016      Assessment & Plan:

## 2017-05-20 ENCOUNTER — Ambulatory Visit: Payer: BLUE CROSS/BLUE SHIELD | Admitting: Internal Medicine

## 2017-05-20 NOTE — Telephone Encounter (Signed)
error 

## 2017-05-21 ENCOUNTER — Other Ambulatory Visit: Payer: Self-pay | Admitting: Internal Medicine

## 2017-05-22 NOTE — Assessment & Plan Note (Signed)
No neuro change but pain not improving as quickly as hoped, and remains mod to severe; for increased flexeril 10 prn, finish predpac asd, limited vicodin for acute pain prn, and hopefully back to work by [employer]

## 2017-05-22 NOTE — Assessment & Plan Note (Signed)
Mild uncontrolled with evidence for proteinuria, for increased losartan to 100 qd, cont to monitor BP at home and next visit

## 2017-05-22 NOTE — Assessment & Plan Note (Signed)
stable overall by history and exam, recent data reviewed with pt, and pt to continue medical treatment as before,  to f/u any worsening symptoms or concerns Lab Results  Component Value Date   HGBA1C 6.9 (H) 11/11/2016

## 2017-05-22 NOTE — Assessment & Plan Note (Addendum)
For increased losartan 100 qd,  24 hr urine prot significant, ok for renal referral

## 2017-05-28 ENCOUNTER — Other Ambulatory Visit (INDEPENDENT_AMBULATORY_CARE_PROVIDER_SITE_OTHER): Payer: BLUE CROSS/BLUE SHIELD

## 2017-05-28 ENCOUNTER — Telehealth: Payer: Self-pay

## 2017-05-28 ENCOUNTER — Emergency Department (HOSPITAL_COMMUNITY)
Admission: EM | Admit: 2017-05-28 | Discharge: 2017-05-28 | Disposition: A | Discharge status: Home / Self Care | Payer: BLUE CROSS/BLUE SHIELD | Attending: Emergency Medicine | Admitting: Emergency Medicine

## 2017-05-28 ENCOUNTER — Encounter (HOSPITAL_COMMUNITY): Payer: Self-pay

## 2017-05-28 DIAGNOSIS — Z0001 Encounter for general adult medical examination with abnormal findings: Secondary | ICD-10-CM | POA: Diagnosis not present

## 2017-05-28 DIAGNOSIS — Z7984 Long term (current) use of oral hypoglycemic drugs: Secondary | ICD-10-CM | POA: Diagnosis not present

## 2017-05-28 DIAGNOSIS — Z79899 Other long term (current) drug therapy: Secondary | ICD-10-CM | POA: Insufficient documentation

## 2017-05-28 DIAGNOSIS — E119 Type 2 diabetes mellitus without complications: Secondary | ICD-10-CM

## 2017-05-28 DIAGNOSIS — N179 Acute kidney failure, unspecified: Secondary | ICD-10-CM | POA: Diagnosis not present

## 2017-05-28 DIAGNOSIS — I110 Hypertensive heart disease with heart failure: Secondary | ICD-10-CM | POA: Diagnosis not present

## 2017-05-28 DIAGNOSIS — I509 Heart failure, unspecified: Secondary | ICD-10-CM | POA: Insufficient documentation

## 2017-05-28 DIAGNOSIS — R944 Abnormal results of kidney function studies: Secondary | ICD-10-CM | POA: Diagnosis present

## 2017-05-28 LAB — URINALYSIS, ROUTINE W REFLEX MICROSCOPIC
Bilirubin Urine: NEGATIVE
GLUCOSE, UA: NEGATIVE mg/dL
Ketones, ur: NEGATIVE mg/dL
Leukocytes, UA: NEGATIVE
Nitrite: NEGATIVE
PH: 5 (ref 5.0–8.0)
PROTEIN: 100 mg/dL — AB
SPECIFIC GRAVITY, URINE: 1.01 (ref 1.005–1.030)

## 2017-05-28 LAB — BASIC METABOLIC PANEL
BUN: 31 mg/dL — AB (ref 6–23)
CALCIUM: 9.6 mg/dL (ref 8.4–10.5)
CO2: 25 mEq/L (ref 19–32)
CREATININE: 1.57 mg/dL — AB (ref 0.40–1.50)
Chloride: 103 mEq/L (ref 96–112)
GFR: 47.75 mL/min — AB (ref >60.00)
Glucose, Bld: 166 mg/dL — ABNORMAL HIGH (ref 70–99)
Potassium: 4.5 mEq/L (ref 3.5–5.1)
Sodium: 138 mEq/L (ref 135–145)

## 2017-05-28 LAB — HEPATIC FUNCTION PANEL
ALK PHOS: 56 U/L (ref 39–117)
ALT: 21 U/L (ref 0–53)
AST: 14 U/L (ref 0–37)
Albumin: 3.7 g/dL (ref 3.5–5.2)
BILIRUBIN DIRECT: 0.2 mg/dL (ref 0.0–0.3)
BILIRUBIN TOTAL: 0.8 mg/dL (ref 0.2–1.2)
Total Protein: 7.2 g/dL (ref 6.0–8.3)

## 2017-05-28 LAB — HEMOGLOBIN A1C: Hgb A1c MFr Bld: 6.3 % (ref 4.6–6.5)

## 2017-05-28 LAB — LIPID PANEL
CHOL/HDL RATIO: 2
Cholesterol: 105 mg/dL (ref 0–200)
HDL: 46.5 mg/dL (ref >39.00)
LDL CALC: 37 mg/dL (ref 0–99)
NONHDL: 58.17
TRIGLYCERIDES: 105 mg/dL (ref 0.0–149.0)
VLDL: 21 mg/dL (ref 0.0–40.0)

## 2017-05-28 MED ORDER — SODIUM CHLORIDE 0.9 % IV BOLUS (SEPSIS)
1000.0000 mL | Freq: Once | INTRAVENOUS | Status: AC
  Administered 2017-05-28: 1000 mL via INTRAVENOUS

## 2017-05-28 NOTE — Discharge Instructions (Signed)
Please read attached information. If you experience any new or worsening signs or symptoms please return to the emergency room for evaluation. Please follow-up with your primary care provider or specialist as discussed.  Please discontinue using antihypertensive medication and Ultram until follow-up with your primary care provider.

## 2017-05-28 NOTE — ED Provider Notes (Signed)
[facility] DEPT Provider Note   CSN: [id number] Arrival date & time: 05/28/17  1414     History   Chief Complaint Chief Complaint  Patient presents with  . Abnormal Lab    abnormal renal function tests  . Back Pain    HPI Gerald Day is a 61 y.o. male.  HPI    61 year old male with a past medical history of hypertension, hyperlipidemia, diabetes, back pain presents today at the request of his primary care provider for AKI.  Patient notes he has been in his usual state of health with no significant complaints.  He notes he had labs drawn for a follow-up appointment with his primary care next week for his yearly exam.  Patient notes he was called and told to come to the emergency room due to decreased renal function.  Patient notes recent medication change of tramadol after pulling a muscle in his back, he reports starting this last week, also he cut his losartan doubled last week; patient additionally notes using HCTZ for his hypertension.  Patient notes that today he has had less urine output, but leading up to today no significant changes in baseline urine.  She denies any difficulty with urination or emptying his bladder.  He denies any fever, nausea, vomiting, chest pain or shortness of breath.  Patient denies any over-the-counter medications.   Past Medical History:  Diagnosis Date  . Arthritis    IN KNEES  . Cellulitis 10/26/2013   LOWER RT EXTREMITY  . CELLULITIS/ABSCESS, LEG 06/27/2007  . COLONIC POLYPS   . CONGESTIVE HEART FAILURE   . DIABETES MELLITUS, TYPE II   . DIVERTICULOSIS, COLON   . ERECTILE DYSFUNCTION, ORGANIC   . GOUT NOS   . Headache   . HYPERLIPIDEMIA   . HYPERTENSION   . LOW BACK PAIN   . Morbid obesity ([place])   . SLEEP APNEA, OBSTRUCTIVE    USES CPAP    Patient Active Problem List   Diagnosis Date Noted  . Low back pain 05/14/2017  . Proteinuria 11/22/2016  . Skin lesion 11/12/2015  . Preop exam for internal medicine 01/16/2015  .  Cellulitis and abscess of leg 10/26/2013  . DM foot ulcer ([place]) 10/23/2013  . Preventative health care 07/05/2011  . History of colonic polyps 09/05/2009  . COLONIC POLYPS 03/14/2008  . DIVERTICULOSIS, COLON 03/14/2008  . Hyperlipidemia 06/27/2007  . CELLULITIS/ABSCESS, LEG 06/27/2007  . Diabetes ([place]) 05/21/2007  . GOUT NOS 05/21/2007  . Morbid obesity ([place]) 05/21/2007  . ERECTILE DYSFUNCTION 05/21/2007  . OSA (obstructive sleep apnea) 05/21/2007  . Essential hypertension 05/21/2007  . LOW BACK PAIN 05/21/2007    Past Surgical History:  Procedure Laterality Date  . APPENDECTOMY    . FOOT SURGERY     LT  . TENOTOMY / FLEXOR TENDON TRANSFER Right 03/15/2015   Procedure: TENOTOMY HT REPAIR/ULCER DEBRIDEMENT/GRAFT PREP/ACELL GRAFT APPLICATION;  Surgeon: Jana Half, DPM;  Location: [facility];  Service: Podiatry;  Laterality: Right;  HALLUX       Home Medications    Prior to Admission medications   Medication Sig Start Date End Date Taking? Authorizing Provider  amLODipine (NORVASC) 10 MG tablet TAKE 1 TABLET (10 MG TOTAL) BY MOUTH DAILY. 05/17/17  Yes Biagio Borg, MD  atorvastatin (LIPITOR) 10 MG tablet TAKE 1 TABLET (10 MG TOTAL) BY MOUTH DAILY. 04/14/17  Yes Biagio Borg, MD  cyclobenzaprine (FLEXERIL) 10 MG tablet Take 1 tablet (10 mg total) by mouth 3 (three) times  daily as needed for muscle spasms. 05/19/17  Yes Biagio Borg, MD  furosemide (LASIX) 80 MG tablet Take 1 tablet (80 mg total) by mouth 2 (two) times daily. 01/18/17  Yes Biagio Borg, MD  glipiZIDE (GLUCOTROL XL) 5 MG 24 hr tablet TAKE 1 TABLET BY MOUTH EVERY DAY WITH BREAKFAST 11/23/16  Yes Biagio Borg, MD  HYDROcodone-acetaminophen (NORCO) 7.5-325 MG tablet Take 1 tablet by mouth every 6 (six) hours as needed for moderate pain. 05/19/17  Yes Biagio Borg, MD  KLOR-CON M20 20 MEQ tablet TAKE 1 TABLET (20 MEQ TOTAL) BY MOUTH DAILY. 11/30/16  Yes Biagio Borg, MD  losartan (COZAAR) 50 MG tablet Take 2 tablets (100  mg total) by mouth daily. 05/19/17 05/19/18 Yes Biagio Borg, MD  metFORMIN (GLUCOPHAGE-XR) 500 MG 24 hr tablet TAKE 4 TABLETS BY MOUTH PER DAY 05/21/17  Yes Biagio Borg, MD  traMADol (ULTRAM) 50 MG tablet Take 1 tablet (50 mg total) by mouth every 8 (eight) hours as needed. 05/14/17  Yes Biagio Borg, MD  glucose blood (ONE TOUCH ULTRA TEST) test strip Use as directed once daily to check blood sugar.  Diagnosis code 250.02 05/11/14   Biagio Borg, MD  meloxicam (MOBIC) 15 MG tablet Take 1 tablet (15 mg total) by mouth daily. Patient not taking: Reported on 05/28/2017 10/03/16   Brunetta Jeans, PA-C  predniSONE (DELTASONE) 10 MG tablet 2 tabs by mouth per day for 5 days Patient not taking: Reported on 05/28/2017 05/14/17   Biagio Borg, MD  tadalafil (CIALIS) 20 MG tablet Take 1 tablet (20 mg total) by mouth daily as needed for erectile dysfunction. 05/20/16 06/19/16  Biagio Borg, MD    Family History Family History  Problem Relation Age of Onset  . Cardiomyopathy Father   . Asthma Other     Social History Social History  Substance Use Topics  . Smoking status: Never Smoker  . Smokeless tobacco: Never Used  . Alcohol use 1.2 oz/week    2 Cans of beer per week     Comment: 6 beers a week     Allergies   Penicillins   Review of Systems Review of Systems  All other systems reviewed and are negative.    Physical Exam Updated Vital Signs BP (!) 148/81   Pulse 79   Temp 99.1 F (37.3 C) (Oral)   Resp 18   Ht 6' (1.829 m)   Wt (!) 165.6 kg (365 lb 1 oz)   SpO2 99%   BMI 49.51 kg/m   Physical Exam  Constitutional: He is oriented to person, place, and time. He appears well-developed and well-nourished.  HENT:  Head: Normocephalic and atraumatic.  Eyes: Pupils are equal, round, and reactive to light. Conjunctivae are normal. Right eye exhibits no discharge. Left eye exhibits no discharge. No scleral icterus.  Neck: Normal range of motion. No JVD present. No tracheal  deviation present.  Pulmonary/Chest: Effort normal. No stridor.  Abdominal: Soft. He exhibits no distension and no mass. There is no tenderness. There is no rebound and no guarding. No hernia.  Obese abdomen - NTTP- no CVA tenderness   Neurological: He is alert and oriented to person, place, and time. Coordination normal.  Skin: Skin is warm.  Psychiatric: He has a normal mood and affect. His behavior is normal. Judgment and thought content normal.  Nursing note and vitals reviewed.   ED Treatments / Results  Labs (all labs ordered  are listed, but only abnormal results are displayed) Labs Reviewed  URINALYSIS, ROUTINE W REFLEX MICROSCOPIC - Abnormal; Notable for the following:       Result Value   Hgb urine dipstick SMALL (*)    Protein, ur 100 (*)    Bacteria, UA RARE (*)    Squamous Epithelial / LPF 0-5 (*)    All other components within normal limits  CREATININE, URINE, RANDOM  SODIUM, URINE, RANDOM    EKG  EKG Interpretation None       Radiology No results found.  Procedures Procedures (including critical care time)  Medications Ordered in ED Medications  sodium chloride 0.9 % bolus 1,000 mL (0 mLs Intravenous Stopped 05/28/17 2056)     Initial Impression / Assessment and Plan / ED Course  I have reviewed the triage vital signs and the nursing notes.  Pertinent labs & imaging results that were available during my care of the patient were reviewed by me and considered in my medical decision making (see chart for details).      Final Clinical Impressions(s) / ED Diagnoses   Final diagnoses:  AKI (acute kidney injury) ([place])   Labs: BMP, lipid panel, hepatic function panel, A1c: GFR 75.75, creatinine 1.57 up from 0.986 months ago, BUN 31 up from 16  Imaging:  Consults:  Therapeutics: NS 1 L  Discharge Meds:   Assessment/Plan: 61 year old male sent over for evaluation of elevated creatinine.  Patient is well-appearing in no acute distress.  He has no  abdominal pain, with normal urine output.  Patient's AK is likely multifactorial secondary to dehydration and recent medications, although I am uncertain if this is an acute change as most recent labs were 6 months ago with no change in patient's baseline health.  Patient will discontinue using medication, stay hydrated, he will follow-up with his primary care in 3 days for repeat laboratory analysis.  Patient was counseled on return precautions, he verbalized understanding and agreement to today's plan had no further questions or concerns at time discharge.     New Prescriptions Discharge Medication List as of 05/28/2017  9:07 PM       Okey Regal, PA-C 05/28/17 2304    Varney Biles, MD 05/29/17 [phone]

## 2017-05-28 NOTE — ED Triage Notes (Signed)
Patient reports back pain x 2 weeks. Patient states he had blood drawn this AM and was told to come to the ED because renal function tests were abnormal. Patient states he has only voided once today despite drinking water today.

## 2017-05-28 NOTE — Telephone Encounter (Signed)
Informed pt, he expressed understanding and he will be going to [employer].

## 2017-05-28 NOTE — Telephone Encounter (Signed)
-----   Message from Biagio Borg, MD sent at 05/28/2017 12:34 PM EDT ----- Labs were done for pending appt  Gerald Day to contact pt - lab is consistent with at least mild to mod acute renal failure  He should go to ED now, and do not take losartan or lasix further unless directed .

## 2017-05-28 NOTE — ED Notes (Signed)
Per PCP, being sent over for abnormally low BUN/Creatine

## 2017-05-29 LAB — SODIUM, URINE, RANDOM: Sodium, Ur: 76 mmol/L

## 2017-05-29 LAB — CREATININE, URINE, RANDOM: Creatinine, Urine: 88.28 mg/dL

## 2017-06-02 ENCOUNTER — Ambulatory Visit (INDEPENDENT_AMBULATORY_CARE_PROVIDER_SITE_OTHER): Payer: BLUE CROSS/BLUE SHIELD | Admitting: Internal Medicine

## 2017-06-02 ENCOUNTER — Encounter: Payer: Self-pay | Admitting: Internal Medicine

## 2017-06-02 ENCOUNTER — Other Ambulatory Visit (INDEPENDENT_AMBULATORY_CARE_PROVIDER_SITE_OTHER): Payer: BLUE CROSS/BLUE SHIELD

## 2017-06-02 VITALS — BP 160/104 | HR 95 | Temp 97.5°F | Ht 72.0 in | Wt 390.0 lb

## 2017-06-02 DIAGNOSIS — N179 Acute kidney failure, unspecified: Secondary | ICD-10-CM

## 2017-06-02 DIAGNOSIS — Z0000 Encounter for general adult medical examination without abnormal findings: Secondary | ICD-10-CM | POA: Diagnosis not present

## 2017-06-02 DIAGNOSIS — I10 Essential (primary) hypertension: Secondary | ICD-10-CM | POA: Diagnosis not present

## 2017-06-02 DIAGNOSIS — E119 Type 2 diabetes mellitus without complications: Secondary | ICD-10-CM | POA: Diagnosis not present

## 2017-06-02 LAB — BASIC METABOLIC PANEL
BUN: 15 mg/dL (ref 6–23)
CALCIUM: 9.6 mg/dL (ref 8.4–10.5)
CHLORIDE: 103 meq/L (ref 96–112)
CO2: 28 meq/L (ref 19–32)
CREATININE: 1.04 mg/dL (ref 0.40–1.50)
GFR: 76.8 mL/min (ref >60.00)
Glucose, Bld: 169 mg/dL — ABNORMAL HIGH (ref 70–99)
Potassium: 4.3 mEq/L (ref 3.5–5.1)
SODIUM: 138 meq/L (ref 135–145)

## 2017-06-02 MED ORDER — FUROSEMIDE 80 MG PO TABS: 80.0000 mg | ORAL_TABLET | Freq: Every day | ORAL | 3 refills | Status: DC | PRN

## 2017-06-02 MED ORDER — HYDROCODONE-ACETAMINOPHEN 7.5-325 MG PO TABS: 1.0000 | ORAL_TABLET | Freq: Four times a day (QID) | ORAL | 0 refills | Status: DC | PRN

## 2017-06-02 NOTE — Patient Instructions (Addendum)
OK to take the lasix AS NEEDED only for worsening swelling  Please take all new medication as prescribed - the losartan at 100 mg per day  Please continue all other medications as before, and refills have been done if requested - the pain medication  Please have the pharmacy call with any other refills you may need.  Please keep your appointments with your specialists as you may have planned  Please go to the LAB in the Basement (turn left off the elevator) for the tests to be done today  Please go to the LAB in the Basement (turn left off the elevator) for the tests to be done IN 2 WEEKS as well  You will be contacted by phone if any changes need to be made immediately.  Otherwise, you will receive a letter about your results with an explanation, but please check with MyChart first.  Please remember to sign up for MyChart if you have not done so, as this will be important to you in the future with finding out test results, communicating by private email, and scheduling acute appointments online when needed.  You are given the work note  Please ask at the desk as you leave for a Sports Medicine appointment  Please return in 6 months, or sooner if needed, with Lab testing done 3-5 days before

## 2017-06-02 NOTE — Progress Notes (Signed)
Subjective:    Patient ID: Gerald Day, male    DOB: 06-29-1956, 61 y.o.   MRN: [id number]  HPI Here to f/u; overall doing ok,  Pt denies chest pain, increasing sob or doe, wheezing, orthopnea, PND, increased LE swelling, palpitations, dizziness or syncope.  Pt denies new neurological symptoms such as new headache, or facial or extremity weakness or numbness.  Pt denies polydipsia, polyuria, or low sugar episode.  Pt states overall good compliance with meds, mostly trying to follow appropriate diet, with wt overall stable,  Declines flu shot  Pt continues to have recurring midline LBP with lifting at work, but no bowel or bladder change, fever, wt loss,  worsening LE pain/numbness/weakness, gait change or falls. Past Medical History:  Diagnosis Date  . Arthritis    IN KNEES  . Cellulitis 10/26/2013   LOWER RT EXTREMITY  . CELLULITIS/ABSCESS, LEG 06/27/2007  . COLONIC POLYPS   . CONGESTIVE HEART FAILURE   . DIABETES MELLITUS, TYPE II   . DIVERTICULOSIS, COLON   . ERECTILE DYSFUNCTION, ORGANIC   . GOUT NOS   . Headache   . HYPERLIPIDEMIA   . HYPERTENSION   . LOW BACK PAIN   . Morbid obesity ([place])   . SLEEP APNEA, OBSTRUCTIVE    USES CPAP   Past Surgical History:  Procedure Laterality Date  . APPENDECTOMY    . FOOT SURGERY     LT  . TENOTOMY / FLEXOR TENDON TRANSFER Right 03/15/2015   Procedure: TENOTOMY HT REPAIR/ULCER DEBRIDEMENT/GRAFT PREP/ACELL GRAFT APPLICATION;  Surgeon: Jana Half, DPM;  Location: [facility];  Service: Podiatry;  Laterality: Right;  HALLUX    reports that he has never smoked. He has never used smokeless tobacco. He reports that he drinks about 1.2 oz of alcohol per week . He reports that he does not use drugs. family history includes Asthma in his other; Cardiomyopathy in his father. Allergies  Allergen Reactions  . Penicillins Other (See Comments)    Unknown childhood allergic reaction   Current Outpatient Prescriptions on File Prior to Visit    Medication Sig Dispense Refill  . amLODipine (NORVASC) 10 MG tablet TAKE 1 TABLET (10 MG TOTAL) BY MOUTH DAILY. 90 tablet 1  . atorvastatin (LIPITOR) 10 MG tablet TAKE 1 TABLET (10 MG TOTAL) BY MOUTH DAILY. 90 tablet 1  . cyclobenzaprine (FLEXERIL) 10 MG tablet Take 1 tablet (10 mg total) by mouth 3 (three) times daily as needed for muscle spasms. 50 tablet 0  . glipiZIDE (GLUCOTROL XL) 5 MG 24 hr tablet TAKE 1 TABLET BY MOUTH EVERY DAY WITH BREAKFAST 90 tablet 3  . glucose blood (ONE TOUCH ULTRA TEST) test strip Use as directed once daily to check blood sugar.  Diagnosis code 250.02 100 each 11  . KLOR-CON M20 20 MEQ tablet TAKE 1 TABLET (20 MEQ TOTAL) BY MOUTH DAILY. 30 tablet 8  . losartan (COZAAR) 50 MG tablet Take 2 tablets (100 mg total) by mouth daily. 180 tablet 3  . meloxicam (MOBIC) 15 MG tablet Take 1 tablet (15 mg total) by mouth daily. 10 tablet 0  . metFORMIN (GLUCOPHAGE-XR) 500 MG 24 hr tablet TAKE 4 TABLETS BY MOUTH PER DAY 360 tablet 2  . predniSONE (DELTASONE) 10 MG tablet 2 tabs by mouth per day for 5 days 10 tablet 0  . traMADol (ULTRAM) 50 MG tablet Take 1 tablet (50 mg total) by mouth every 8 (eight) hours as needed. 60 tablet 0  . tadalafil (CIALIS) 20  MG tablet Take 1 tablet (20 mg total) by mouth daily as needed for erectile dysfunction. 5 tablet 11   No current facility-administered medications on file prior to visit.     Review of Systems  Constitutional: Negative for other unusual diaphoresis or sweats HENT: Negative for ear discharge or swelling Eyes: Negative for other worsening visual disturbances Respiratory: Negative for stridor or other swelling  Gastrointestinal: Negative for worsening distension or other blood Genitourinary: Negative for retention or other urinary change Musculoskeletal: Negative for other MSK pain or swelling Skin: Negative for color change or other new lesions Neurological: Negative for worsening tremors and other numbness   Psychiatric/Behavioral: Negative for worsening agitation or other fatigue All other system neg per pt    Objective:   Physical Exam BP (!) 160/104   Pulse 95   Temp (!) 97.5 F (36.4 C) (Oral)   Ht 6' (1.829 m)   Wt (!) 390 lb (176.9 kg)   SpO2 99%   BMI 52.89 kg/m  VS noted,  Constitutional: Pt appears in NAD HENT: Head: NCAT.  Right Ear: External ear normal.  Left Ear: External ear normal.  Eyes: . Pupils are equal, round, and reactive to light. Conjunctivae and EOM are normal Nose: without d/c or deformity Neck: Neck supple. Gross normal ROM Cardiovascular: Normal rate and regular rhythm.   Pulmonary/Chest: Effort normal and breath sounds without rales or wheezing.  Abd:  Soft, NT, ND, + BS, no organomegaly Spine nontender lumbar in midline Neurological: Pt is alert. At baseline orientation, motor grossly intact Skin: Skin is warm. No rashes, other new lesions, no LE edema Psychiatric: Pt behavior is normal without agitation  No other exam findings    Assessment & Plan:

## 2017-06-05 NOTE — Assessment & Plan Note (Signed)
stable overall by history and exam, recent data reviewed with pt, and pt to continue medical treatment as before,  to f/u any worsening symptoms or concerns Lab Results  Component Value Date   HGBA1C 6.3 05/28/2017

## 2017-06-05 NOTE — Assessment & Plan Note (Signed)
liekly recent mild low volume related with overdiuresis, for restart losartan 100 qd

## 2017-06-05 NOTE — Assessment & Plan Note (Signed)
See above AKI

## 2017-06-07 ENCOUNTER — Telehealth: Payer: Self-pay | Admitting: Family Medicine

## 2017-06-07 ENCOUNTER — Encounter: Payer: Self-pay | Admitting: Family Medicine

## 2017-06-07 ENCOUNTER — Ambulatory Visit (INDEPENDENT_AMBULATORY_CARE_PROVIDER_SITE_OTHER)
Admission: RE | Admit: 2017-06-07 | Discharge: 2017-06-07 | Disposition: A | Discharge status: Home / Self Care | Payer: PRIVATE HEALTH INSURANCE | Source: Ambulatory Visit | Attending: Family Medicine | Admitting: Family Medicine

## 2017-06-07 ENCOUNTER — Ambulatory Visit (INDEPENDENT_AMBULATORY_CARE_PROVIDER_SITE_OTHER): Payer: PRIVATE HEALTH INSURANCE | Admitting: Family Medicine

## 2017-06-07 VITALS — BP 130/80 | HR 83 | Temp 98.3°F | Ht 72.0 in | Wt 389.0 lb

## 2017-06-07 DIAGNOSIS — M545 Low back pain, unspecified: Secondary | ICD-10-CM

## 2017-06-07 MED ORDER — MELOXICAM 15 MG PO TABS: 15.0000 mg | ORAL_TABLET | Freq: Every day | ORAL | 1 refills | Status: DC

## 2017-06-07 NOTE — Assessment & Plan Note (Signed)
Most likely contributing to his symptoms. - Counseled on weight loss - Could refer to weight loss management

## 2017-06-07 NOTE — Patient Instructions (Addendum)
Thank you for coming in,   Please try the exercises on a regular basis. Please try to take some mobic for 14 days straight.  We will call you with results from today.  Please follow-up with me in 2 weeks to see how you're doing.   Please feel free to call with any questions or concerns at any time, at [phone]. --Dr. Raeford Razor

## 2017-06-07 NOTE — Telephone Encounter (Signed)
Informed patient's wife of xray results.   Rosemarie Ax, MD [facility] Primary Care & Sports Medicine 06/07/2017, 5:05 PM

## 2017-06-07 NOTE — Progress Notes (Signed)
Gerald Day - 61 y.o. male MRN [id number]  Date of birth: [birth year]  SUBJECTIVE:  Including CC & ROS.  Chief Complaint  Patient presents with  . Back Pain    Patient states that he has had low back pain for 3 weeks.     Mr. Hang is a 61 year old male is presenting with low back pain. He reports the pain is bilateral in nature with no radicular symptoms. The pain started about 3 weeks ago. He denies any specific inciting event. The pain is severe neck and nature. The pain is worse with a transition from sitting to standing. Also pulling or pushing causes significant pain in the lower aspect of his back. The pain is sharp and stabbing in nature. He has not had any improvement with the medications today. He has tried Norco, prednisone, tramadol, and Flexeril. He had taken off 3 days of work last week. He work this morning and had exacerbation of his pain.   Review of his labs from 06/02/17 shows a normal renal function. Hemoglobin A1c from 05/28/17 shows 6.3.  Review of Systems  Constitutional: Negative for fever.  Musculoskeletal: Positive for back pain. Negative for gait problem.  Skin: Negative for color change.  Neurological: Negative for weakness and numbness.    HISTORY: Past Medical, Surgical, Social, and Family History Reviewed & Updated per EMR.   Pertinent Historical Findings include:  Past Medical History:  Diagnosis Date  . Arthritis    IN KNEES  . Cellulitis 10/26/2013   LOWER RT EXTREMITY  . CELLULITIS/ABSCESS, LEG 06/27/2007  . COLONIC POLYPS   . CONGESTIVE HEART FAILURE   . DIABETES MELLITUS, TYPE II   . DIVERTICULOSIS, COLON   . ERECTILE DYSFUNCTION, ORGANIC   . GOUT NOS   . Headache   . HYPERLIPIDEMIA   . HYPERTENSION   . LOW BACK PAIN   . Morbid obesity ([place])   . SLEEP APNEA, OBSTRUCTIVE    USES CPAP    Past Surgical History:  Procedure Laterality Date  . APPENDECTOMY    . FOOT SURGERY     LT  . TENOTOMY / FLEXOR TENDON TRANSFER Right 03/15/2015   Procedure: TENOTOMY HT REPAIR/ULCER DEBRIDEMENT/GRAFT PREP/ACELL GRAFT APPLICATION;  Surgeon: Jana Half, DPM;  Location: [facility];  Service: Podiatry;  Laterality: Right;  HALLUX    Allergies  Allergen Reactions  . Penicillins Other (See Comments)    Unknown childhood allergic reaction    Family History  Problem Relation Age of Onset  . Cardiomyopathy Father   . Asthma Other      Social History   Social History  . Marital status: Married    Spouse name: N/A  . Number of children: N/A  . Years of education: N/A   Occupational History  . Not on file.   Social History Main Topics  . Smoking status: Never Smoker  . Smokeless tobacco: Never Used  . Alcohol use 1.2 oz/week    2 Cans of beer per week     Comment: 6 beers a week  . Drug use: No  . Sexual activity: Not on file   Other Topics Concern  . Not on file   Social History Narrative  . No narrative on file     PHYSICAL EXAM:  VS: BP 130/80 (BP Location: Left Arm, Patient Position: Sitting, Cuff Size: Large)   Pulse 83   Temp 98.3 F (36.8 C) (Oral)   Ht 6' (1.829 m)   Wt (!) 389 lb (176.4  kg)   SpO2 98%   BMI 52.76 kg/m  Physical Exam Gen: NAD, alert, cooperative with exam, morbidly obese ENT: normal lips, normal nasal mucosa,  Eye: normal EOM, normal conjunctiva and lids CV:  no edema, +2 pedal pulses   Resp: no accessory muscle use, non-labored,  Skin: no rashes, no areas of induration  Neuro: normal tone, normal sensation to touch Psych:  normal insight, alert and oriented MSK:  Back: No tenderness to palpation of the lumbar spine, greater trochanter, or SI joints. Normal gait. Pain with flexion. Normal internal and external rotation of the hips. Normal knee flexion and extension. Negative straight leg raise bilaterally. Neurovascularly intact     ASSESSMENT & PLAN:   Morbid obesity Most likely contributing to his symptoms. - Counseled on weight loss - Could refer to weight loss  management  Low back pain Has not had any improvement with any of the modalities tried today. Appears to be related to muscle spasm. Likely has an imbalance in obesity is a complicating factor.  - Initiate mobic and continue the muscle relaxer  - Referral to physical therapy  - X-rays today  - Work no provided to keep work until next Monday.  - If no improvement can consider trigger point injections

## 2017-06-07 NOTE — Assessment & Plan Note (Signed)
Has not had any improvement with any of the modalities tried today. Appears to be related to muscle spasm. Likely has an imbalance in obesity is a complicating factor.  - Initiate mobic and continue the muscle relaxer  - Referral to physical therapy  - X-rays today  - Work no provided to keep work until next Monday.  - If no improvement can consider trigger point injections

## 2017-06-11 ENCOUNTER — Encounter: Payer: Self-pay | Admitting: Internal Medicine

## 2017-06-11 ENCOUNTER — Other Ambulatory Visit (INDEPENDENT_AMBULATORY_CARE_PROVIDER_SITE_OTHER): Payer: PRIVATE HEALTH INSURANCE

## 2017-06-11 DIAGNOSIS — N179 Acute kidney failure, unspecified: Secondary | ICD-10-CM

## 2017-06-11 LAB — BASIC METABOLIC PANEL
BUN: 23 mg/dL (ref 6–23)
CHLORIDE: 100 meq/L (ref 96–112)
CO2: 29 meq/L (ref 19–32)
Calcium: 9.5 mg/dL (ref 8.4–10.5)
Creatinine, Ser: 1.16 mg/dL (ref 0.40–1.50)
GFR: 67.7 mL/min (ref >60.00)
Glucose, Bld: 128 mg/dL — ABNORMAL HIGH (ref 70–99)
POTASSIUM: 4.7 meq/L (ref 3.5–5.1)
SODIUM: 137 meq/L (ref 135–145)

## 2017-06-15 ENCOUNTER — Ambulatory Visit (INDEPENDENT_AMBULATORY_CARE_PROVIDER_SITE_OTHER): Payer: PRIVATE HEALTH INSURANCE | Admitting: Family Medicine

## 2017-06-15 ENCOUNTER — Encounter: Payer: Self-pay | Admitting: Family Medicine

## 2017-06-15 VITALS — BP 138/78 | HR 77 | Temp 97.9°F | Ht 72.0 in | Wt 381.0 lb

## 2017-06-15 DIAGNOSIS — M545 Low back pain, unspecified: Secondary | ICD-10-CM

## 2017-06-15 NOTE — Progress Notes (Signed)
Gerald Day - 61 y.o. male MRN [id number]  Date of birth: Oct 17, 1955  SUBJECTIVE:  Including CC & ROS.  Chief Complaint  Patient presents with  . Back Pain    lower back not improved-constant pain    Gerald Day is a 61 year old male that is following up for his ongoing low back pain. He was provided meloxicam and had some improvement of his pain. He was back to his regular job and had exacerbation of his pain this morning. The pain is located in the lower aspect of his back with no radicular symptoms. The pain is severe in nature. He has trouble moving and bending over and rising from a seated position. Has not had a numbness or tingling. Has not had any fevers.   I have independently reviewed his lumbar spine from 06/16/17 which showed degenerative changes and loss of disc height at L1-T12. Review his lab work from 10/5 shows normal renal function and normal electrolytes.  Review of Systems  Constitutional: Negative for fever.  Musculoskeletal: Positive for back pain, gait problem and myalgias. Negative for joint swelling.  Skin: Negative for color change.  Neurological: Negative for weakness and numbness.  Hematological: Negative for adenopathy.    HISTORY: Past Medical, Surgical, Social, and Family History Reviewed & Updated per EMR.   Pertinent Historical Findings include:  Past Medical History:  Diagnosis Date  . Arthritis    IN KNEES  . Cellulitis 10/26/2013   LOWER RT EXTREMITY  . CELLULITIS/ABSCESS, LEG 06/27/2007  . COLONIC POLYPS   . CONGESTIVE HEART FAILURE   . DIABETES MELLITUS, TYPE II   . DIVERTICULOSIS, COLON   . ERECTILE DYSFUNCTION, ORGANIC   . GOUT NOS   . Headache   . HYPERLIPIDEMIA   . HYPERTENSION   . LOW BACK PAIN   . Morbid obesity ([place])   . SLEEP APNEA, OBSTRUCTIVE    USES CPAP    Past Surgical History:  Procedure Laterality Date  . APPENDECTOMY    . FOOT SURGERY     LT  . TENOTOMY / FLEXOR TENDON TRANSFER Right 03/15/2015   Procedure:  TENOTOMY HT REPAIR/ULCER DEBRIDEMENT/GRAFT PREP/ACELL GRAFT APPLICATION;  Surgeon: Jana Half, DPM;  Location: [facility];  Service: Podiatry;  Laterality: Right;  HALLUX    Allergies  Allergen Reactions  . Penicillins Other (See Comments)    Unknown childhood allergic reaction    Family History  Problem Relation Age of Onset  . Cardiomyopathy Father   . Asthma Other      Social History   Social History  . Marital status: Married    Spouse name: N/A  . Number of children: N/A  . Years of education: N/A   Occupational History  . Not on file.   Social History Main Topics  . Smoking status: Never Smoker  . Smokeless tobacco: Never Used  . Alcohol use 1.2 oz/week    2 Cans of beer per week     Comment: 6 beers a week  . Drug use: No  . Sexual activity: Not on file   Other Topics Concern  . Not on file   Social History Narrative  . No narrative on file     PHYSICAL EXAM:  VS: BP 138/78 (BP Location: Left Arm, Patient Position: Sitting)   Pulse 77   Temp 97.9 F (36.6 C) (Oral)   Ht 6' (1.829 m)   Wt (!) 381 lb (172.8 kg)   SpO2 98%   BMI 51.67 kg/m  Physical  Exam Gen: NAD, alert, cooperative with exam, well-appearing ENT: normal lips, normal nasal mucosa,  Eye: normal EOM, normal conjunctiva and lids CV:  no edema, +2 pedal pulses   Resp: no accessory muscle use, non-labored,  Skin: no rashes, no areas of induration  Neuro: normal tone, normal sensation to touch Psych:  normal insight, alert and oriented MSK:  Back: Tenderness to palpation over the paraspinal muscles. No tenderness to palpation of the piriformis or greater trochanter. Negative straight leg raise bilaterally. Normal strength with resistance to hip flexion. Neurovascularly intact   Aspiration/Injection Procedure Note Gerald Day July 08, 1956  Procedure: Injection Indications: Right paraspinal pain  Procedure Details Consent: Risks of procedure as well as the alternatives and  risks of each were explained to the (patient/caregiver).  Consent for procedure obtained. Time Out: Verified patient identification, verified procedure, site/side was marked, verified correct patient position, special equipment/implants available, medications/allergies/relevent history reviewed, required imaging and test results available.  Performed.  The area was cleaned with iodine and alcohol swabs.    The right paraspinal trigger point was injected using 5 cc's of 0.5% Marcaine with a 27 1 1/2" needle.  Ultrasound was used. Images were obtained in Transverse  views showing the injection.    A sterile dressing was applied.  Patient did tolerate procedure well.         ASSESSMENT & PLAN:   Low back pain He had some improvement initially with his pain but then had an exacerbation. It is likely associated with a muscle spasm. He has not had any physical therapy sessions. - Trigger point injection today with anesthetic - Can continue anti-inflammatory. - Counseled to follow through with physical therapy. - Advised follow-up in 3-4 weeks. If no improvement may need a obtain an MRI of his lumbar spine to evaluate facet junctions  Morbid obesity Counseled and congratulated on recent weight loss.

## 2017-06-15 NOTE — Assessment & Plan Note (Signed)
He had some improvement initially with his pain but then had an exacerbation. It is likely associated with a muscle spasm. He has not had any physical therapy sessions. - Trigger point injection today with anesthetic - Can continue anti-inflammatory. - Counseled to follow through with physical therapy. - Advised follow-up in 3-4 weeks. If no improvement may need a obtain an MRI of his lumbar spine to evaluate facet junctions

## 2017-06-15 NOTE — Patient Instructions (Signed)
Thank you for coming in,   Please try to go to physical therapy.    Please feel free to call with any questions or concerns at any time, at [phone]. --Dr. Raeford Razor

## 2017-06-15 NOTE — Assessment & Plan Note (Signed)
Counseled and congratulated on recent weight loss.

## 2017-06-17 DIAGNOSIS — Z0279 Encounter for issue of other medical certificate: Secondary | ICD-10-CM

## 2017-06-18 ENCOUNTER — Encounter: Payer: Self-pay | Admitting: Internal Medicine

## 2017-06-18 ENCOUNTER — Ambulatory Visit (INDEPENDENT_AMBULATORY_CARE_PROVIDER_SITE_OTHER): Payer: PRIVATE HEALTH INSURANCE | Admitting: Internal Medicine

## 2017-06-18 VITALS — BP 158/98 | HR 88 | Temp 98.5°F | Ht 72.0 in | Wt 377.0 lb

## 2017-06-18 DIAGNOSIS — M545 Low back pain, unspecified: Secondary | ICD-10-CM

## 2017-06-18 DIAGNOSIS — K5900 Constipation, unspecified: Secondary | ICD-10-CM

## 2017-06-18 DIAGNOSIS — N179 Acute kidney failure, unspecified: Secondary | ICD-10-CM | POA: Diagnosis not present

## 2017-06-18 MED ORDER — CYCLOBENZAPRINE HCL 10 MG PO TABS: 10.0000 mg | ORAL_TABLET | Freq: Three times a day (TID) | ORAL | 0 refills | Status: DC | PRN

## 2017-06-18 NOTE — Patient Instructions (Signed)
Please take all new medication as prescribed - the muscle relaxer as needed  Please continue all other medications as before, and refills have been done if requested.  Please have the pharmacy call with any other refills you may need.  Please keep your appointments with your specialists as you may have planned  You will be contacted regarding the referral for: Physical Therapy, and MRI LS Spine  Please continue to take fluids as you have been doing to keep from dehydration  Please also take Miralax 17 gm by mouth daily to prevent worsening constipation  We will have you see PCC's today regarding the PT and MRI  Your ST Disability form is to be filled out

## 2017-06-18 NOTE — Progress Notes (Signed)
Subjective:    Patient ID: Gerald Day, male    DOB: 03-Oct-1955, 61 y.o.   MRN: [id number]  HPI  Here unfortunately with ongoing low back pain, sharp, without radiation,intermittent, better with little pain to lie down, but much worse to sit, stand up, walking and not really better with ice and heat tx nand nsaid and trigger injection.  Has not been yet contacted about PT referral from oct 1.  Has been ongoing problem since sept 7.  Not better with prednisone from sept 7.  Had #30 hydrocodone from me sept 236, has only taken a few, trying to take if possible, only for most severe pain.  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  Last full day of sept 6.  Did tryt to go back to work on Monday oct 8 and also one hour on tues oct 10.  Pt without bowel or bladder change, fever, wt loss,  worsening LE pain/numbness/weakness, gait change or falls. No falls  But forced to walk with a crutch.  Has been more constipated more in the past few days. Did have episode AKI due to low volume related to less activity and less po intake.   Pt denies fever, night sweats, loss of appetite, or other constitutional symptoms.  States he was 410 about time of back pain onset, now 377 due to lower appetite due to pain per pt.  Denies worsening depressive symptoms, suicidal ideation, or panic.  Sons' family had new baby last night.    Wt Readings from Last 3 Encounters:  06/18/17 (!) 377 lb (171 kg)  06/15/17 (!) 381 lb (172.8 kg)  06/07/17 (!) 389 lb (176.4 kg)  now being told by his job he needs the ST disability or he wil be let go Past Medical History:  Diagnosis Date  . Arthritis    IN KNEES  . Cellulitis 10/26/2013   LOWER RT EXTREMITY  . CELLULITIS/ABSCESS, LEG 06/27/2007  . COLONIC POLYPS   . CONGESTIVE HEART FAILURE   . DIABETES MELLITUS, TYPE II   . DIVERTICULOSIS, COLON   . ERECTILE DYSFUNCTION, ORGANIC   . GOUT NOS   . Headache   . HYPERLIPIDEMIA   . HYPERTENSION   . LOW BACK PAIN   . Morbid obesity  ([place])   . SLEEP APNEA, OBSTRUCTIVE    USES CPAP   Past Surgical History:  Procedure Laterality Date  . APPENDECTOMY    . FOOT SURGERY     LT  . TENOTOMY / FLEXOR TENDON TRANSFER Right 03/15/2015   Procedure: TENOTOMY HT REPAIR/ULCER DEBRIDEMENT/GRAFT PREP/ACELL GRAFT APPLICATION;  Surgeon: Jana Half, DPM;  Location: [facility];  Service: Podiatry;  Laterality: Right;  HALLUX    reports that he has never smoked. He has never used smokeless tobacco. He reports that he drinks about 1.2 oz of alcohol per week . He reports that he does not use drugs. family history includes Asthma in his other; Cardiomyopathy in his father. Allergies  Allergen Reactions  . Penicillins Other (See Comments)    Unknown childhood allergic reaction   Current Outpatient Prescriptions on File Prior to Visit  Medication Sig Dispense Refill  . amLODipine (NORVASC) 10 MG tablet TAKE 1 TABLET (10 MG TOTAL) BY MOUTH DAILY. 90 tablet 1  . atorvastatin (LIPITOR) 10 MG tablet TAKE 1 TABLET (10 MG TOTAL) BY MOUTH DAILY. 90 tablet 1  . cyclobenzaprine (FLEXERIL) 10 MG tablet Take 1 tablet (10 mg total) by mouth 3 (three) times daily as needed  for muscle spasms. 50 tablet 0  . furosemide (LASIX) 80 MG tablet Take 1 tablet (80 mg total) by mouth daily as needed. 90 tablet 3  . glipiZIDE (GLUCOTROL XL) 5 MG 24 hr tablet TAKE 1 TABLET BY MOUTH EVERY DAY WITH BREAKFAST 90 tablet 3  . HYDROcodone-acetaminophen (NORCO) 7.5-325 MG tablet Take 1 tablet by mouth every 6 (six) hours as needed for moderate pain. 30 tablet 0  . KLOR-CON M20 20 MEQ tablet TAKE 1 TABLET (20 MEQ TOTAL) BY MOUTH DAILY. 30 tablet 8  . losartan (COZAAR) 50 MG tablet Take 2 tablets (100 mg total) by mouth daily. 180 tablet 3  . meloxicam (MOBIC) 15 MG tablet Take 1 tablet (15 mg total) by mouth daily. 30 tablet 1  . metFORMIN (GLUCOPHAGE-XR) 500 MG 24 hr tablet TAKE 4 TABLETS BY MOUTH PER DAY 360 tablet 2  . traMADol (ULTRAM) 50 MG tablet Take 1 tablet (50 mg  total) by mouth every 8 (eight) hours as needed. 60 tablet 0   No current facility-administered medications on file prior to visit.    Review of Systems  Constitutional: Negative for other unusual diaphoresis or sweats HENT: Negative for ear discharge or swelling Eyes: Negative for other worsening visual disturbances Respiratory: Negative for stridor or other swelling  Gastrointestinal: Negative for worsening distension or other blood Genitourinary: Negative for retention or other urinary change Musculoskeletal: Negative for other MSK pain or swelling Skin: Negative for color change or other new lesions Neurological: Negative for worsening tremors and other numbness  Psychiatric/Behavioral: Negative for worsening agitation or other fatigue All other system neg per pt    Objective:   Physical Exam BP (!) 158/98   Pulse 88   Temp 98.5 F (36.9 C) (Oral)   Ht 6' (1.829 m)   Wt (!) 377 lb (171 kg)   SpO2 (!) 9%   BMI 51.13 kg/m  VS noted, supermorbid obese Constitutional: Pt appears in NAD HENT: Head: NCAT.  Right Ear: External ear normal.  Left Ear: External ear normal.  Eyes: . Pupils are equal, round, and reactive to light. Conjunctivae and EOM are normal Nose: without d/c or deformity Neck: Neck supple. Gross normal ROM Cardiovascular: Normal rate and regular rhythm.   Pulmonary/Chest: Effort normal and breath sounds without rales or wheezing.  Abd:  Soft, NT, ND, + BS, no organomegaly Spine with mild tender in midline lowest lumbar, also some spasm noted left lumbar paravertebral, without skin change or swelling Neurological: Pt is alert. At baseline orientation, motor grossly intact Skin: Skin is warm. No rashes, other new lesions, chronic mild LE edema Psychiatric: Pt behavior is normal without agitation  No other exam findings  Lab Results  Component Value Date   WBC 8.7 11/11/2016   HGB 12.9 (L) 11/11/2016   HCT 37.6 (L) 11/11/2016   PLT 254.0 11/11/2016    GLUCOSE 128 (H) 06/11/2017   CHOL 105 05/28/2017   TRIG 105.0 05/28/2017   HDL 46.50 05/28/2017   LDLDIRECT 107.6 05/08/2014   LDLCALC 37 05/28/2017   ALT 21 05/28/2017   AST 14 05/28/2017   NA 137 06/11/2017   K 4.7 06/11/2017   CL 100 06/11/2017   CREATININE 1.16 06/11/2017   BUN 23 06/11/2017   CO2 29 06/11/2017   TSH 2.56 11/11/2016   PSA 0.43 11/11/2016   HGBA1C 6.3 05/28/2017   MICROALBUR >300.0 (H) 11/11/2016     Assessment & Plan:

## 2017-06-18 NOTE — Assessment & Plan Note (Signed)
Overall now been about 6 wks onset pain without improvement with conservative measures, has some evidence for left lumbar spasm today and no specific neuro changes, but persistent debilitating pain will necessitate MRI LS Spine, and PT as he is able, for muscle relaxer as needed, and cont all other meds including the hydrocodone sparingly.  Pt to have ST disability form signed.

## 2017-06-18 NOTE — Assessment & Plan Note (Signed)
Ok for [employer] daily otc prn

## 2017-06-18 NOTE — Assessment & Plan Note (Signed)
Resolved, encouraged pt to continue adequate po intake

## 2017-06-22 ENCOUNTER — Ambulatory Visit: Payer: PRIVATE HEALTH INSURANCE | Attending: Family Medicine | Admitting: Physical Therapy

## 2017-06-22 ENCOUNTER — Encounter: Payer: Self-pay | Admitting: Physical Therapy

## 2017-06-22 DIAGNOSIS — M545 Low back pain, unspecified: Secondary | ICD-10-CM

## 2017-06-22 DIAGNOSIS — R2689 Other abnormalities of gait and mobility: Secondary | ICD-10-CM | POA: Diagnosis present

## 2017-06-22 DIAGNOSIS — G8929 Other chronic pain: Secondary | ICD-10-CM | POA: Insufficient documentation

## 2017-06-22 DIAGNOSIS — R293 Abnormal posture: Secondary | ICD-10-CM | POA: Diagnosis present

## 2017-06-22 DIAGNOSIS — M62830 Muscle spasm of back: Secondary | ICD-10-CM | POA: Diagnosis present

## 2017-06-22 NOTE — Therapy (Deleted)
[place], [place], [address] Phone: [phone]   Fax:  [phone]  Physical Therapy Treatment  Patient Details  Name: Gerald Day MRN: [id number] Date of Birth: 1955/09/30 Referring Provider: Rosemarie Ax, MD  Encounter Date: 06/22/2017      PT End of Session - 06/22/17 1109    Visit Number 1   Number of Visits 13   Date for PT Re-Evaluation 08/03/17   PT Start Time 1146   PT Stop Time 1230   PT Time Calculation (min) 44 min   Activity Tolerance Patient tolerated treatment well   Behavior During Therapy [facility] for tasks assessed/performed      Past Medical History:  Diagnosis Date  . Arthritis    IN KNEES  . Cellulitis 10/26/2013   LOWER RT EXTREMITY  . CELLULITIS/ABSCESS, LEG 06/27/2007  . COLONIC POLYPS   . CONGESTIVE HEART FAILURE   . DIABETES MELLITUS, TYPE II   . DIVERTICULOSIS, COLON   . ERECTILE DYSFUNCTION, ORGANIC   . GOUT NOS   . Headache   . HYPERLIPIDEMIA   . HYPERTENSION   . LOW BACK PAIN   . Morbid obesity ([place])   . SLEEP APNEA, OBSTRUCTIVE    USES CPAP    Past Surgical History:  Procedure Laterality Date  . APPENDECTOMY    . FOOT SURGERY     LT  . TENOTOMY / FLEXOR TENDON TRANSFER Right 03/15/2015   Procedure: TENOTOMY HT REPAIR/ULCER DEBRIDEMENT/GRAFT PREP/ACELL GRAFT APPLICATION;  Surgeon: Jana Half, DPM;  Location: [place];  Service: Podiatry;  Laterality: Right;  HALLUX    There were no vitals filed for this visit.      Subjective Assessment - 06/22/17 1152    Subjective "pt is a 61 y.o m with CC of low back pain that started about a month ago with no report of MOI, reported he felt a pinch after he finished work and it gradually got worse.  since onset the pain seems to be the same that fluctuates with activity. denies n/t and any red flags. hx of intermittent low back pain which pt reports is different than the current issues.    Limitations House  hold activities  prolonged standing/ walking   How long can you sit comfortably? unlimited   How long can you stand comfortably? with 1 crutch 60 min   How long can you walk comfortably? with 1 crutch 60 min   Diagnostic tests x-ray, MRI on 06/26/2017   Patient Stated Goals get rid of the pain, return to work, do what he is used to    Currently in Pain? Yes   Pain Score 7   took medication for pain at 8am, at worst 10/10   Pain Location Back   Pain Orientation Left;Right;Lower;Mid   Pain Descriptors / Indicators Sharp;Aching;Tightness   Pain Type Chronic pain   Pain Onset More than a month ago   Pain Frequency Constant   Aggravating Factors  prolonged walking/ standing, bending forward   Pain Relieving Factors medication, heating pad, ice pack            [facility] PT Assessment - 06/22/17 1110      Assessment   Medical Diagnosis low back pain without sciatica   Referring Provider Rosemarie Ax, MD   Onset Date/Surgical Date --  1 month   Hand Dominance Left   Next MD Visit --  return to PCP in 3 weeks   Prior Therapy  no     Precautions   Precautions None     Restrictions   Weight Bearing Restrictions No     Balance Screen   Has the patient fallen in the past 6 months No   Has the patient had a decrease in activity level because of a fear of falling?  No   Is the patient reluctant to leave their home because of a fear of falling?  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, squatting   Leisure hangout with grandchildren     Cognition   Overall Cognitive Status  Within Functional Limits for tasks assessed     Posture/Postural Control   Posture/Postural Control Postural limitations   Postural Limitations Rounded Shoulders;Forward head;Increased lumbar lordosis     AROM   Lumbar Flexion 10  PDM   Lumbar Extension 20  PDM   Lumbar - Right Side Bend 10   Lumbar - Left Side Bend 30  PDM     Strength   Right Hip Flexion 4-/5  pain in the back during testing   Right Hip Extension 4/5   Right Hip ABduction 4+/5   Right Hip ADduction 4+/5  Pain during testing   Left Hip Flexion 4/5   Left Hip Extension 4/5   Left Hip ABduction 4+/5   Left Hip ADduction 4+/5   Right/Left Knee Right;Left   Right Knee Flexion 4+/5   Right Knee Extension 4+/5   Left Knee Flexion 4+/5   Left Knee Extension 4+/5     Palpation   SI assessment  difficult to assess due to pt size   Palpation comment bil lumbar paraspinals with R>L, tenderness in the lumbosacral area L>R     Special Tests    Special Tests Sacrolliac Tests   Sacroiliac Tests  Sacral Thrust     Sacral thrust    Findings Positive   Side Left     Gaenslen's test   Findings Negative   Side  Left     Ambulation/Gait   Ambulation/Gait Yes   Assistive device R Axillary Crutch   Gait Pattern Decreased stride length;Step-through pattern;Trendelenburg;Antalgic;Decreased trunk rotation                     [facility] Adult PT Treatment/Exercise - 06/22/17 1110      Lumbar Exercises: Stretches   Lower Trunk Rotation --  2 x 10   Lower Trunk Rotation Limitations to stay within pain free ROM   Quadruped Mid Back Stretch Limitations seated low back stretching 2 x 30 sec hold  cues to use pain as guide and to avoid quick movements     Knee/Hip Exercises: Stretches   Active Hamstring Stretch 30 seconds;3 reps  contract/ relax with 10 sec contraction     Knee/Hip Exercises: Supine   Straight Leg Raises 2 sets;10 reps     Manual Therapy   Manual Therapy Muscle Energy Technique    Muscle Energy Technique resisted L hip flexion 5 x 10 sec hold  PT Education - 06/22/17 1223    Education provided Yes   Education Details evaluation findings, POC, goals, HEP with form/ rationale. anatomy of area involved   Person(s) Educated Patient   Methods Explanation;Verbal cues;Handout   Comprehension Verbalized understanding;Verbal cues required          PT Short Term Goals - 06/22/17 1249      PT SHORT TERM GOAL #1   Title pt to be I with inital HEP   Time 3   Period Weeks   Status New   Target Date 07/13/17     PT SHORT TERM GOAL #2   Title pt to vebalize/ demo proper posture during sitting/ standing and lifting utilizing proper mechanics to reduce and prevent low back pain    Time 3   Period Weeks   Status New   Target Date 07/13/17     PT SHORT TERM GOAL #3   Title pt to demo decreased paraspinal spasm to decrease pain to </= 5/10 and promote trunk mobility    Time 3   Period Weeks   Status New   Target Date 07/13/17           PT Long Term Goals - 06/22/17 1251      PT LONG TERM GOAL #1   Title pt to increase trunk flexion to >/= 60 degrees and bil sidebending to >/= 20 degrees with </= 2/10 pain for functional mobility required for ADLs   Time 6   Period Weeks   Status New   Target Date 08/03/17     PT LONG TERM GOAL #2   Title pt to stand/walk >/= 1 hour without using an AD with </= 2/10 pain for functional endurance required for work related activities    Time 6   Period Weeks   Status New   Target Date 08/03/17     PT LONG TERM GOAL #3   Title pt to increase FOTO score to </= 49% limited to demo improvement in function   Time 6   Period Weeks   Status New   Target Date 08/03/17     PT LONG TERM GOAL #4   Title pt to be I with all HEP given as of last visit    Time 6   Period Weeks   Status New   Target Date 08/03/17               Plan - 06/22/17 1228    Clinical Impression Statement pt presents to  [facility] with CC of low back pain that started about 1 month ago with non-traumatic onset. pt demonstates significant parapsinals spasm R>L limiting trunk mobility in all planes with pain. pt demonstates functional strength in bil LE. pt may demo innominate rotation but testing inconclusive due to pt size.TTP along bil lumbar paraspinals, and lumosacral region on the L/R. pt reported decreased Lsided following innominate. He would benefit from physcial therapy to decrease low back pain, reduce spasm, improve mobility and return to PLOF by addressing the deficits listed.    History and Personal Factors relevant to plan of care: hx of HTN, DM, and obesity   Clinical Presentation Evolving   Clinical Presentation due to: limited trunk mobility, muscle spasm, low back pain with fluctuating sx   Clinical Decision Making Moderate   Rehab Potential Good   PT Frequency 2x / week   PT Duration 6 weeks   PT Treatment/Interventions ADLs/Self Care Home Management;Cryotherapy;Electrical Stimulation;Iontophoresis 78m/ml Dexamethasone;Moist Heat;Ultrasound;Therapeutic activities;Therapeutic exercise;Traction;Dry needling;Manual  techniques;Patient/family education;Passive range of motion;Balance training;Gait training;Stair training;Taping   PT Next Visit Plan Review and update HEP, manual for low back, core strengthening, possible innominate rotation on the L hamstring stretching hip flexor activation. modalities PRN   PT Home Exercise Plan seated low back stretching, hamstrings stretching, SLR, self innominate MET, LTR   Consulted and Agree with Plan of Care Patient      Patient will benefit from skilled therapeutic intervention in order to improve the following deficits and impairments:  Abnormal gait, Pain, Improper body mechanics, Obesity, Postural dysfunction, Increased fascial restricitons, Increased muscle spasms, Decreased range of motion, Decreased activity tolerance, Decreased balance, Decreased  endurance  Visit Diagnosis: Chronic bilateral low back pain without sciatica - Plan: PT plan of care cert/re-cert  Muscle spasm of back - Plan: PT plan of care cert/re-cert  Abnormal posture - Plan: PT plan of care cert/re-cert  Other abnormalities of gait and mobility - Plan: PT plan of care cert/re-cert     Problem List Patient Active Problem List   Diagnosis Date Noted  . Constipation 06/18/2017  . AKI (acute kidney injury) ([place]) 06/02/2017  . Low back pain 05/14/2017  . Proteinuria 11/22/2016  . Skin lesion 11/12/2015  . Preop exam for internal medicine 01/16/2015  . Cellulitis and abscess of leg 10/26/2013  . DM foot ulcer ([place]) 10/23/2013  . Preventative health care 07/05/2011  . History of colonic polyps 09/05/2009  . COLONIC POLYPS 03/14/2008  . DIVERTICULOSIS, COLON 03/14/2008  . Hyperlipidemia 06/27/2007  . CELLULITIS/ABSCESS, LEG 06/27/2007  . Diabetes ([place]) 05/21/2007  . GOUT NOS 05/21/2007  . Morbid obesity ([place]) 05/21/2007  . ERECTILE DYSFUNCTION 05/21/2007  . OSA (obstructive sleep apnea) 05/21/2007  . Essential hypertension 05/21/2007  . LOW BACK PAIN 05/21/2007   Starr Lake PT, DPT, LAT, ATC  06/22/17  1:00 PM      [facility] [address] [place], [place], [address] Phone: [phone]   Fax:  [phone]  Name: Gerald Day MRN: [id number] Date of Birth: June 22, 1956

## 2017-06-22 NOTE — Therapy (Signed)
[place], [place], [address] Phone: [phone]   Fax:  [phone]  Physical Therapy Evaluation  Patient Details  Name: Gerald Day MRN: [id number] Date of Birth: 1956/03/10 Referring Provider: Rosemarie Ax, MD  Encounter Date: 06/22/2017      PT End of Session - 06/22/17 1109    Visit Number 1   Number of Visits 13   Date for PT Re-Evaluation 08/03/17   PT Start Time 1146   PT Stop Time 1230   PT Time Calculation (min) 44 min   Activity Tolerance Patient tolerated treatment well   Behavior During Therapy [facility] for tasks assessed/performed      Past Medical History:  Diagnosis Date  . Arthritis    IN KNEES  . Cellulitis 10/26/2013   LOWER RT EXTREMITY  . CELLULITIS/ABSCESS, LEG 06/27/2007  . COLONIC POLYPS   . CONGESTIVE HEART FAILURE   . DIABETES MELLITUS, TYPE II   . DIVERTICULOSIS, COLON   . ERECTILE DYSFUNCTION, ORGANIC   . GOUT NOS   . Headache   . HYPERLIPIDEMIA   . HYPERTENSION   . LOW BACK PAIN   . Morbid obesity ([place])   . SLEEP APNEA, OBSTRUCTIVE    USES CPAP    Past Surgical History:  Procedure Laterality Date  . APPENDECTOMY    . FOOT SURGERY     LT  . TENOTOMY / FLEXOR TENDON TRANSFER Right 03/15/2015   Procedure: TENOTOMY HT REPAIR/ULCER DEBRIDEMENT/GRAFT PREP/ACELL GRAFT APPLICATION;  Surgeon: Jana Half, DPM;  Location: [place];  Service: Podiatry;  Laterality: Right;  HALLUX    There were no vitals filed for this visit.       Subjective Assessment - 06/22/17 1152    Subjective "pt is a 61 y.o m with CC of low back pain that started about a month ago with no report of MOI, reported he felt a pinch after he finished work and it gradually got worse.  since onset the pain seems to be the same that fluctuates with activity. denies n/t and any red flags. hx of intermittent low back pain which pt reports is different than the current issues.    Limitations  House hold activities  prolonged standing/ walking   How long can you sit comfortably? unlimited   How long can you stand comfortably? with 1 crutch 60 min   How long can you walk comfortably? with 1 crutch 60 min   Diagnostic tests x-ray, MRI on 06/26/2017   Patient Stated Goals get rid of the pain, return to work, do what he is used to    Currently in Pain? Yes   Pain Score 7   took medication for pain at 8am, at worst 10/10   Pain Location Back   Pain Orientation Left;Right;Lower;Mid   Pain Descriptors / Indicators Sharp;Aching;Tightness   Pain Type Chronic pain   Pain Onset More than a month ago   Pain Frequency Constant   Aggravating Factors  prolonged walking/ standing, bending forward   Pain Relieving Factors medication, heating pad, ice pack            [facility] PT Assessment - 06/22/17 1110      Assessment   Medical Diagnosis low back pain without sciatica   Referring Provider Rosemarie Ax, MD   Onset Date/Surgical Date --  1 month   Hand Dominance Left   Next MD Visit --  return to PCP in 3 weeks   Prior  Therapy no     Precautions   Precautions None     Restrictions   Weight Bearing Restrictions No     Balance Screen   Has the patient fallen in the past 6 months No   Has the patient had a decrease in activity level because of a fear of falling?  No   Is the patient reluctant to leave their home because of a fear of falling?  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, squatting   Leisure hangout with grandchildren     Cognition   Overall Cognitive  Status Within Functional Limits for tasks assessed     Posture/Postural Control   Posture/Postural Control Postural limitations   Postural Limitations Rounded Shoulders;Forward head;Increased lumbar lordosis     AROM   Lumbar Flexion 10  PDM   Lumbar Extension 20  PDM   Lumbar - Right Side Bend 10   Lumbar - Left Side Bend 30  PDM     Strength   Right Hip Flexion 4-/5  pain in the back during testing   Right Hip Extension 4/5   Right Hip ABduction 4+/5   Right Hip ADduction 4+/5  Pain during testing   Left Hip Flexion 4/5   Left Hip Extension 4/5   Left Hip ABduction 4+/5   Left Hip ADduction 4+/5   Right/Left Knee Right;Left   Right Knee Flexion 4+/5   Right Knee Extension 4+/5   Left Knee Flexion 4+/5   Left Knee Extension 4+/5     Palpation   SI assessment  difficult to assess due to pt size   Palpation comment bil lumbar paraspinals with R>L, tenderness in the lumbosacral area L>R     Special Tests    Special Tests Sacrolliac Tests   Sacroiliac Tests  Sacral Thrust     Sacral thrust    Findings Positive   Side Left     Gaenslen's test   Findings Negative   Side  Left     Ambulation/Gait   Ambulation/Gait Yes   Assistive device R Axillary Crutch   Gait Pattern Decreased stride length;Step-through pattern;Trendelenburg;Antalgic;Decreased trunk rotation            Objective measurements completed on examination: See above findings.          [place] Adult PT Treatment/Exercise - 06/22/17 1110      Lumbar Exercises: Stretches   Lower Trunk Rotation --  2 x 10   Lower Trunk Rotation Limitations to stay within pain free ROM   Quadruped Mid Back Stretch Limitations seated low back stretching 2 x 30 sec hold  cues to use pain as guide and to avoid quick movements     Knee/Hip Exercises: Stretches   Active Hamstring Stretch 30 seconds;3 reps  contract/ relax with 10 sec contraction     Knee/Hip Exercises: Supine   Straight Leg Raises 2 sets;10  reps     Manual Therapy   Manual Therapy Muscle Energy Technique   Muscle Energy Technique resisted L hip flexion 5 x 10 sec hold  PT Education - 06/22/17 1223    Education provided Yes   Education Details evaluation findings, POC, goals, HEP with form/ rationale. anatomy of area involved   Person(s) Educated Patient   Methods Explanation;Verbal cues;Handout   Comprehension Verbalized understanding;Verbal cues required          PT Short Term Goals - 06/22/17 1249      PT SHORT TERM GOAL #1   Title pt to be I with inital HEP   Time 3   Period Weeks   Status New   Target Date 07/13/17     PT SHORT TERM GOAL #2   Title pt to vebalize/ demo proper posture during sitting/ standing and lifting utilizing proper mechanics to reduce and prevent low back pain    Time 3   Period Weeks   Status New   Target Date 07/13/17     PT SHORT TERM GOAL #3   Title pt to demo decreased paraspinal spasm to decrease pain to </= 5/10 and promote trunk mobility    Time 3   Period Weeks   Status New   Target Date 07/13/17           PT Long Term Goals - 06/22/17 1251      PT LONG TERM GOAL #1   Title pt to increase trunk flexion to >/= 60 degrees and bil sidebending to >/= 20 degrees with </= 2/10 pain for functional mobility required for ADLs   Time 6   Period Weeks   Status New   Target Date 08/03/17     PT LONG TERM GOAL #2   Title pt to stand/walk >/= 1 hour without using an AD with </= 2/10 pain for functional endurance required for work related activities    Time 6   Period Weeks   Status New   Target Date 08/03/17     PT LONG TERM GOAL #3   Title pt to increase FOTO score to </= 49% limited to demo improvement in function   Time 6   Period Weeks   Status New   Target Date 08/03/17     PT LONG TERM GOAL #4   Title pt to be I with all HEP given as of last visit    Time 6   Period Weeks   Status New   Target Date 08/03/17                 Plan - 06/22/17 1228    Clinical Impression Statement pt presents to OPPT with CC of low back pain that started about 1 month ago with non-traumatic onset. pt demonstates significant parapsinals spasm R>L limiting trunk mobility in all planes with pain. pt demonstates functional strength in bil LE. pt may demo innominate rotation but testing inconclusive due to pt size.TTP along bil lumbar paraspinals, and lumosacral region on the L/R. pt reported decreased Lsided following innominate. He would benefit from physcial therapy to decrease low back pain, reduce spasm, improve mobility and return to PLOF by addressing the deficits listed.    History and Personal Factors relevant to plan of care: hx of HTN, DM, and obesity   Clinical Presentation Evolving   Clinical Presentation due to: limited trunk mobility, muscle spasm, low back pain with fluctuating sx   Clinical Decision Making Moderate   Rehab Potential Good   PT Frequency 2x / week   PT Duration 6 weeks   PT Treatment/Interventions ADLs/Self Care Home Management;Cryotherapy;Electrical Stimulation;Iontophoresis 40m/ml Dexamethasone;Moist Heat;Ultrasound;Therapeutic activities;Therapeutic exercise;Traction;Dry  needling;Manual techniques;Patient/family education;Passive range of motion;Balance training;Gait training;Stair training;Taping   PT Next Visit Plan Review and update HEP, manual for low back, core strengthening, possible innominate rotation on the L hamstring stretching hip flexor activation. modalities PRN   PT Home Exercise Plan seated low back stretching, hamstrings stretching, SLR, self innominate MET, LTR   Consulted and Agree with Plan of Care Patient      Patient will benefit from skilled therapeutic intervention in order to improve the following deficits and impairments:  Abnormal gait, Pain, Improper body mechanics, Obesity, Postural dysfunction, Increased fascial restricitons, Increased muscle spasms, Decreased range of motion,  Decreased activity tolerance, Decreased balance, Decreased endurance  Visit Diagnosis: Chronic bilateral low back pain without sciatica - Plan: PT plan of care cert/re-cert  Muscle spasm of back - Plan: PT plan of care cert/re-cert  Abnormal posture - Plan: PT plan of care cert/re-cert  Other abnormalities of gait and mobility - Plan: PT plan of care cert/re-cert     Problem List Patient Active Problem List   Diagnosis Date Noted  . Constipation 06/18/2017  . AKI (acute kidney injury) ([place]) 06/02/2017  . Low back pain 05/14/2017  . Proteinuria 11/22/2016  . Skin lesion 11/12/2015  . Preop exam for internal medicine 01/16/2015  . Cellulitis and abscess of leg 10/26/2013  . DM foot ulcer ([place]) 10/23/2013  . Preventative health care 07/05/2011  . History of colonic polyps 09/05/2009  . COLONIC POLYPS 03/14/2008  . DIVERTICULOSIS, COLON 03/14/2008  . Hyperlipidemia 06/27/2007  . CELLULITIS/ABSCESS, LEG 06/27/2007  . Diabetes ([place]) 05/21/2007  . GOUT NOS 05/21/2007  . Morbid obesity ([place]) 05/21/2007  . ERECTILE DYSFUNCTION 05/21/2007  . OSA (obstructive sleep apnea) 05/21/2007  . Essential hypertension 05/21/2007  . LOW BACK PAIN 05/21/2007   Starr Lake PT, DPT, LAT, ATC  06/22/17  1:01 PM      [facility] [address] [place], [place], [address] Phone: [phone]   Fax:  [phone]  Name: DEBBIE BELLUCCI MRN: [id number] Date of Birth: 29-Jan-1956

## 2017-06-26 ENCOUNTER — Encounter: Payer: Self-pay | Admitting: Internal Medicine

## 2017-06-26 ENCOUNTER — Ambulatory Visit
Admission: RE | Admit: 2017-06-26 | Discharge: 2017-06-26 | Disposition: A | Discharge status: Home / Self Care | Payer: PRIVATE HEALTH INSURANCE | Source: Ambulatory Visit | Attending: Internal Medicine | Admitting: Internal Medicine

## 2017-06-26 ENCOUNTER — Other Ambulatory Visit: Payer: Self-pay | Admitting: Internal Medicine

## 2017-06-26 DIAGNOSIS — M47816 Spondylosis without myelopathy or radiculopathy, lumbar region: Secondary | ICD-10-CM

## 2017-06-26 DIAGNOSIS — M545 Low back pain, unspecified: Secondary | ICD-10-CM

## 2017-06-26 DIAGNOSIS — M519 Unspecified thoracic, thoracolumbar and lumbosacral intervertebral disc disorder: Secondary | ICD-10-CM

## 2017-06-28 ENCOUNTER — Encounter: Payer: Self-pay | Admitting: Physical Therapy

## 2017-06-28 ENCOUNTER — Telehealth: Payer: Self-pay

## 2017-06-28 ENCOUNTER — Ambulatory Visit: Payer: PRIVATE HEALTH INSURANCE | Admitting: Physical Therapy

## 2017-06-28 DIAGNOSIS — M545 Low back pain, unspecified: Secondary | ICD-10-CM

## 2017-06-28 DIAGNOSIS — R2689 Other abnormalities of gait and mobility: Secondary | ICD-10-CM

## 2017-06-28 DIAGNOSIS — R293 Abnormal posture: Secondary | ICD-10-CM

## 2017-06-28 DIAGNOSIS — M62830 Muscle spasm of back: Secondary | ICD-10-CM

## 2017-06-28 DIAGNOSIS — G8929 Other chronic pain: Secondary | ICD-10-CM

## 2017-06-28 NOTE — Telephone Encounter (Signed)
-----   Message from Biagio Borg, MD sent at 06/26/2017  3:34 PM EDT ----- Left message on MyChart, pt to cont same tx except  The test results show that your current treatment is OK, except there is evidence for disc herniated "inwards" so that is is touching the covering of the spinal cord area.  This can cause pain.  There is also evidence at another level of arthritis that could be causing pain as well.  There does not seem to be any nerve pinching.  We will go ahead with this evidence with a referral to orthopedic, but please let us know if you are improved, as this can be cancelled if the pain improves with your current medication treatment and Physical Therapy.  I will ask the office to contact you about this as well.   Tiwanna Tuch to please inform pt, I will do referral, but let him know this may not be needed if his pain improves

## 2017-06-28 NOTE — Patient Instructions (Signed)

## 2017-06-28 NOTE — Telephone Encounter (Signed)
Spoke with pt's wife, she stated that the patient was on his way to drop off paperwork at the office for STD. She said when he gets back home she will have him call me to discuss results if I am not able to catch him while he is here.

## 2017-06-28 NOTE — Therapy (Signed)
[place], [place], [address] Phone: [phone]   Fax:  [phone]  Physical Therapy Treatment  Patient Details  Name: Gerald Day MRN: [id number] Date of Birth: [birth year] Referring Provider: Rosemarie Ax, MD  Encounter Date: 06/28/2017      PT End of Session - 06/28/17 1549    Visit Number 2   Number of Visits 13   Date for PT Re-Evaluation 08/03/17   PT Start Time 1550   PT Stop Time 1631   PT Time Calculation (min) 41 min   Activity Tolerance Patient tolerated treatment well   Behavior During Therapy [facility] for tasks assessed/performed      Past Medical History:  Diagnosis Date  . Arthritis    IN KNEES  . Cellulitis 10/26/2013   LOWER RT EXTREMITY  . CELLULITIS/ABSCESS, LEG 06/27/2007  . COLONIC POLYPS   . CONGESTIVE HEART FAILURE   . DIABETES MELLITUS, TYPE II   . DIVERTICULOSIS, COLON   . ERECTILE DYSFUNCTION, ORGANIC   . GOUT NOS   . Headache   . HYPERLIPIDEMIA   . HYPERTENSION   . LOW BACK PAIN   . Morbid obesity ([place])   . SLEEP APNEA, OBSTRUCTIVE    USES CPAP    Past Surgical History:  Procedure Laterality Date  . APPENDECTOMY    . FOOT SURGERY     LT  . TENOTOMY / FLEXOR TENDON TRANSFER Right 03/15/2015   Procedure: TENOTOMY HT REPAIR/ULCER DEBRIDEMENT/GRAFT PREP/ACELL GRAFT APPLICATION;  Surgeon: Jana Half, DPM;  Location: [place];  Service: Podiatry;  Laterality: Right;  HALLUX    There were no vitals filed for this visit.      Subjective Assessment - 06/28/17 1550    Subjective "I am doing better, the pain is about half of what it was"   Currently in Pain? Yes   Pain Score 5    Pain Location Back   Pain Type Chronic pain   Pain Onset More than a month ago   Aggravating Factors  going from sitting to standing, bending forward   Pain Relieving Factors medication, heating pad, ice pack                         [facility] Adult PT  Treatment/Exercise - 06/28/17 1605      Self-Care   Self-Care Posture   Posture posture handout provided with education on mainaining proper form with sitting/ standing and ADLS     Lumbar Exercises: Stretches   Prone on Elbows Stretch --  2 x 10     Lumbar Exercises: Seated   Other Seated Lumbar Exercises seated on dyna disc pelvic tilts 2 x 10, seated marching 3 x 10      Lumbar Exercises: Supine   Bent Knee Raise 10 reps  x 2 sets with ADIM     Knee/Hip Exercises: Stretches   Active Hamstring Stretch 3 reps;30 seconds  contract/ relax with 10 sec contraction     Knee/Hip Exercises: Supine   Straight Leg Raises 2 sets;10 reps;Left  with 15 sec oscillation at mid range end of reps     Manual Therapy   Muscle Energy Technique resisted L hip flexion 10 x 10 sec hold                PT Education - 06/28/17 1718    Education provided Yes   Education Details posture education   Person(s) Educated Patient  Methods Explanation;Verbal cues;Handout;Demonstration   Comprehension Verbalized understanding;Verbal cues required;Returned demonstration          PT Short Term Goals - 06/22/17 1249      PT SHORT TERM GOAL #1   Title pt to be I with inital HEP   Time 3   Period Weeks   Status New   Target Date 07/13/17     PT SHORT TERM GOAL #2   Title pt to vebalize/ demo proper posture during sitting/ standing and lifting utilizing proper mechanics to reduce and prevent low back pain    Time 3   Period Weeks   Status New   Target Date 07/13/17     PT SHORT TERM GOAL #3   Title pt to demo decreased paraspinal spasm to decrease pain to </= 5/10 and promote trunk mobility    Time 3   Period Weeks   Status New   Target Date 07/13/17           PT Long Term Goals - 06/22/17 1251      PT LONG TERM GOAL #1   Title pt to increase trunk flexion to >/= 60 degrees and bil sidebending to >/= 20 degrees with </= 2/10 pain for functional mobility required for ADLs    Time 6   Period Weeks   Status New   Target Date 08/03/17     PT LONG TERM GOAL #2   Title pt to stand/walk >/= 1 hour without using an AD with </= 2/10 pain for functional endurance required for work related activities    Time 6   Period Weeks   Status New   Target Date 08/03/17     PT LONG TERM GOAL #3   Title pt to increase FOTO score to </= 49% limited to demo improvement in function   Time 6   Period Weeks   Status New   Target Date 08/03/17     PT LONG TERM GOAL #4   Title pt to be I with all HEP given as of last visit    Time 6   Period Weeks   Status New   Target Date 08/03/17               Plan - 06/28/17 1718    Clinical Impression Statement pt reports improvement since the last session and consistency with HEP. continued working on possible L innominate posterior rotation with hamstring stretching and MET for L hip flexion. progressed to prone on elbows and core strengthening. pt reported decreased tightness but still felt a twinge in the low back with standing/ walking.    PT Next Visit Plan Review and update HEP, manual for low back, core strengthening, possible innominate rotation on the L hamstring stretching hip flexor activation. extension biased tx, modalities PRN   PT Home Exercise Plan seated low back stretching, hamstrings stretching, SLR, self innominate MET, LTR, posture education, prone on elbows   Consulted and Agree with Plan of Care Patient      Patient will benefit from skilled therapeutic intervention in order to improve the following deficits and impairments:  Abnormal gait, Pain, Improper body mechanics, Obesity, Postural dysfunction, Increased fascial restricitons, Increased muscle spasms, Decreased range of motion, Decreased activity tolerance, Decreased balance, Decreased endurance  Visit Diagnosis: Chronic bilateral low back pain without sciatica  Muscle spasm of back  Abnormal posture  Other abnormalities of gait and  mobility     Problem List Patient Active Problem List   Diagnosis  Date Noted  . Constipation 06/18/2017  . AKI (acute kidney injury) ([place]) 06/02/2017  . Low back pain 05/14/2017  . Proteinuria 11/22/2016  . Skin lesion 11/12/2015  . Preop exam for internal medicine 01/16/2015  . Cellulitis and abscess of leg 10/26/2013  . DM foot ulcer ([place]) 10/23/2013  . Preventative health care 07/05/2011  . History of colonic polyps 09/05/2009  . COLONIC POLYPS 03/14/2008  . DIVERTICULOSIS, COLON 03/14/2008  . Hyperlipidemia 06/27/2007  . CELLULITIS/ABSCESS, LEG 06/27/2007  . Diabetes ([place]) 05/21/2007  . GOUT NOS 05/21/2007  . Morbid obesity ([place]) 05/21/2007  . ERECTILE DYSFUNCTION 05/21/2007  . OSA (obstructive sleep apnea) 05/21/2007  . Essential hypertension 05/21/2007  . LOW BACK PAIN 05/21/2007   Starr Lake PT, DPT, LAT, ATC  06/28/17  5:21 PM      [facility] [address] [place], [place], [address] Phone: [phone]   Fax:  [phone]  Name: Gerald Day MRN: [id number] Date of Birth: 1956/04/15

## 2017-06-30 ENCOUNTER — Ambulatory Visit: Payer: PRIVATE HEALTH INSURANCE | Admitting: Physical Therapy

## 2017-06-30 ENCOUNTER — Encounter: Payer: Self-pay | Admitting: Physical Therapy

## 2017-06-30 DIAGNOSIS — M545 Low back pain: Secondary | ICD-10-CM | POA: Diagnosis not present

## 2017-06-30 DIAGNOSIS — G8929 Other chronic pain: Secondary | ICD-10-CM

## 2017-06-30 DIAGNOSIS — M62830 Muscle spasm of back: Secondary | ICD-10-CM

## 2017-06-30 DIAGNOSIS — R2689 Other abnormalities of gait and mobility: Secondary | ICD-10-CM

## 2017-06-30 DIAGNOSIS — R293 Abnormal posture: Secondary | ICD-10-CM

## 2017-06-30 NOTE — Therapy (Signed)
[place], [place], [address] Phone: [phone]   Fax:  [phone]  Physical Therapy Treatment  Patient Details  Name: Gerald Day MRN: [id number] Date of Birth: 04/26/1956 Referring Provider: Rosemarie Ax, MD  Encounter Date: 06/30/2017      PT End of Session - 06/30/17 1333    Visit Number 3   Number of Visits 13   Date for PT Re-Evaluation 08/03/17   PT Start Time 1332   PT Stop Time 1413   PT Time Calculation (min) 41 min   Activity Tolerance Patient tolerated treatment well   Behavior During Therapy [facility] for tasks assessed/performed      Past Medical History:  Diagnosis Date  . Arthritis    IN KNEES  . Cellulitis 10/26/2013   LOWER RT EXTREMITY  . CELLULITIS/ABSCESS, LEG 06/27/2007  . COLONIC POLYPS   . CONGESTIVE HEART FAILURE   . DIABETES MELLITUS, TYPE II   . DIVERTICULOSIS, COLON   . ERECTILE DYSFUNCTION, ORGANIC   . GOUT NOS   . Headache   . HYPERLIPIDEMIA   . HYPERTENSION   . LOW BACK PAIN   . Morbid obesity ([place])   . SLEEP APNEA, OBSTRUCTIVE    USES CPAP    Past Surgical History:  Procedure Laterality Date  . APPENDECTOMY    . FOOT SURGERY     LT  . TENOTOMY / FLEXOR TENDON TRANSFER Right 03/15/2015   Procedure: TENOTOMY HT REPAIR/ULCER DEBRIDEMENT/GRAFT PREP/ACELL GRAFT APPLICATION;  Surgeon: Jana Half, DPM;  Location: [place];  Service: Podiatry;  Laterality: Right;  HALLUX    There were no vitals filed for this visit.      Subjective Assessment - 06/30/17 1329    Subjective "I am doing pretty good, just getting up from a seated position still gives me issues, the exercise on my stomach helped"    Pain Score 4    Pain Location Back                         [facility] Adult PT Treatment/Exercise - 06/30/17 1337      Lumbar Exercises: Stretches   Active Hamstring Stretch 3 reps;30 seconds  LLE only contract relax with 10 sec hold   Lower Trunk Rotation --  1 x 12   Pelvic Tilt --  1 x 10 holding 3 sec     Lumbar Exercises: Seated   Sit to Stand --  1 x 12 from table     Lumbar Exercises: Supine   Bent Knee Raise --  2 x 10 alternating with bil 3#   Bridge 10 reps  x 3 sets with ball squeeze   Straight Leg Raise --  12 x 2 sets LLE only   Straight Leg Raises Limitations 3#   Other Supine Lumbar Exercises modified table top with alternating LLE movement with 1 x 10     Manual Therapy   Manual Therapy Joint mobilization   Manual therapy comments MTPR over bil lumbar paraspinals x 3 bil, DTM over bil lumbar paraspinals   Joint Mobilization Grade 3 L1-L5 PA mobs, Grade 4-5 Long axis distraction                  PT Short Term Goals - 06/22/17 1249      PT SHORT TERM GOAL #1   Title pt to be I with inital HEP   Time 3   Period Weeks  Status New   Target Date 07/13/17     PT SHORT TERM GOAL #2   Title pt to vebalize/ demo proper posture during sitting/ standing and lifting utilizing proper mechanics to reduce and prevent low back pain    Time 3   Period Weeks   Status New   Target Date 07/13/17     PT SHORT TERM GOAL #3   Title pt to demo decreased paraspinal spasm to decrease pain to </= 5/10 and promote trunk mobility    Time 3   Period Weeks   Status New   Target Date 07/13/17           PT Long Term Goals - 06/22/17 1251      PT LONG TERM GOAL #1   Title pt to increase trunk flexion to >/= 60 degrees and bil sidebending to >/= 20 degrees with </= 2/10 pain for functional mobility required for ADLs   Time 6   Period Weeks   Status New   Target Date 08/03/17     PT LONG TERM GOAL #2   Title pt to stand/walk >/= 1 hour without using an AD with </= 2/10 pain for functional endurance required for work related activities    Time 6   Period Weeks   Status New   Target Date 08/03/17     PT LONG TERM GOAL #3   Title pt to increase FOTO score to </= 49% limited to demo  improvement in function   Time 6   Period Weeks   Status New   Target Date 08/03/17     PT LONG TERM GOAL #4   Title pt to be I with all HEP given as of last visit    Time 6   Period Weeks   Status New   Target Date 08/03/17               Plan - 06/30/17 1430    Clinical Impression Statement pt continues to report pinching in the L low back especially with sit to stand transitions. continued MET techniques and hip mobs, and hip strengthening and trunk extension. post session he reported no pain and declined modalities.    PT Treatment/Interventions ADLs/Self Care Home Management;Cryotherapy;Electrical Stimulation;Iontophoresis 68m/ml Dexamethasone;Moist Heat;Ultrasound;Therapeutic activities;Therapeutic exercise;Traction;Dry needling;Manual techniques;Patient/family education;Passive range of motion;Balance training;Gait training;Stair training;Taping   PT Next Visit Plan Review and update HEP, manual for low back, core strengthening, possible innominate rotation on the L hamstring stretching hip flexor activation. extension biased tx, modalities PRN   Consulted and Agree with Plan of Care Patient      Patient will benefit from skilled therapeutic intervention in order to improve the following deficits and impairments:  Abnormal gait, Pain, Improper body mechanics, Obesity, Postural dysfunction, Increased fascial restricitons, Increased muscle spasms, Decreased range of motion, Decreased activity tolerance, Decreased balance, Decreased endurance  Visit Diagnosis: Chronic bilateral low back pain without sciatica  Muscle spasm of back  Abnormal posture  Other abnormalities of gait and mobility     Problem List Patient Active Problem List   Diagnosis Date Noted  . Constipation 06/18/2017  . AKI ([place] kidney injury) ([place] 06/02/2017  . Low back pain 05/14/2017  . Proteinuria 11/22/2016  . Skin lesion 11/12/2015  . Preop exam for internal medicine 01/16/2015  .  Cellulitis and abscess of leg 10/26/2013  . DM foot ulcer ([place] 10/23/2013  . Preventative health care 07/05/2011  . History of colonic polyps 09/05/2009  . COLONIC POLYPS 03/14/2008  .  DIVERTICULOSIS, COLON 03/14/2008  . Hyperlipidemia 06/27/2007  . CELLULITIS/ABSCESS, LEG 06/27/2007  . Diabetes ([place]) 05/21/2007  . GOUT NOS 05/21/2007  . Morbid obesity ([place]) 05/21/2007  . ERECTILE DYSFUNCTION 05/21/2007  . OSA (obstructive sleep apnea) 05/21/2007  . Essential hypertension 05/21/2007  . LOW BACK PAIN 05/21/2007   Starr Lake PT, DPT, LAT, ATC  06/30/17  2:37 PM      [place] [facility] [address] [place], [place], [address] Phone: [phone]   Fax:  [phone]  Name: AJEET CASASOLA MRN: [id number] Date of Birth: 17-Oct-1955

## 2017-07-02 ENCOUNTER — Telehealth: Payer: Self-pay | Admitting: Internal Medicine

## 2017-07-02 NOTE — Telephone Encounter (Signed)
FMLA forms have been received. Disability/Short term forms had been filled out on 10/12. Patient has a follow up appointment on Nov 2.

## 2017-07-05 ENCOUNTER — Encounter: Payer: Self-pay | Admitting: Physical Therapy

## 2017-07-05 ENCOUNTER — Ambulatory Visit: Payer: PRIVATE HEALTH INSURANCE | Admitting: Physical Therapy

## 2017-07-05 DIAGNOSIS — M545 Low back pain: Principal | ICD-10-CM

## 2017-07-05 DIAGNOSIS — G8929 Other chronic pain: Secondary | ICD-10-CM

## 2017-07-05 DIAGNOSIS — M62830 Muscle spasm of back: Secondary | ICD-10-CM

## 2017-07-05 NOTE — Therapy (Signed)
[place], [place], [address] Phone: [phone]   Fax:  [phone]  Physical Therapy Treatment  Patient Details  Name: Gerald Day MRN: [id number] Date of Birth: 1956/01/11 Referring Provider: Rosemarie Ax, MD  Encounter Date: 07/05/2017      PT End of Session - 07/05/17 1525    Visit Number 4   Number of Visits 13   Date for PT Re-Evaluation 08/03/17   PT Start Time 3532   PT Stop Time 1508   PT Time Calculation (min) 53 min   Activity Tolerance Patient tolerated treatment well;No increased pain   Behavior During Therapy [facility] for tasks assessed/performed      Past Medical History:  Diagnosis Date  . Arthritis    IN KNEES  . Cellulitis 10/26/2013   LOWER RT EXTREMITY  . CELLULITIS/ABSCESS, LEG 06/27/2007  . COLONIC POLYPS   . CONGESTIVE HEART FAILURE   . DIABETES MELLITUS, TYPE II   . DIVERTICULOSIS, COLON   . ERECTILE DYSFUNCTION, ORGANIC   . GOUT NOS   . Headache   . HYPERLIPIDEMIA   . HYPERTENSION   . LOW BACK PAIN   . Morbid obesity ([place])   . SLEEP APNEA, OBSTRUCTIVE    USES CPAP    Past Surgical History:  Procedure Laterality Date  . APPENDECTOMY    . FOOT SURGERY     LT  . TENOTOMY / FLEXOR TENDON TRANSFER Right 03/15/2015   Procedure: TENOTOMY HT REPAIR/ULCER DEBRIDEMENT/GRAFT PREP/ACELL GRAFT APPLICATION;  Surgeon: Jana Half, DPM;  Location: [facility];  Service: Podiatry;  Laterality: Right;  HALLUX    There were no vitals filed for this visit.      Subjective Assessment - 07/05/17 1420    Subjective "I still have a little something in my back every morning and its still a little sore"   Limitations Sitting   How long can you sit comfortably? driving to [place] hurt back    Currently in Pain? Yes   Pain Score 2    Pain Location Back   Pain Orientation Left   Multiple Pain Sites No           [facility] Adult PT Treatment/Exercise - 07/05/17 1427      Lumbar  Exercises: Stretches   Active Hamstring Stretch 3 reps;20 seconds  LLE only, using contract-relax technique (10 second hold)   Pelvic Tilt Other (comment)  supine, 2 sets, x10 reps    Press Ups --  2 sets, x10 reps, MWM, L4-L5 segments      Lumbar Exercises: Seated   Other Seated Lumbar Exercises lumbar stretch rolling physioball  x2 reps, hold 30 seconds      Lumbar Exercises: Supine   Straight Leg Raise 10 reps  1 set, w/ manual resistance    Other Supine Lumbar Exercises table top position, modified 2nd, 3rd sets to alternate marching  x3 sets, 20 seconds    Other Supine Lumbar Exercises modified abdominal crunch using wedge  1 set, x 5 reps      Knee/Hip Exercises: Aerobic   Nustep L5, 5 minutes      Modalities   Modalities Cryotherapy     Cryotherapy   Number Minutes Cryotherapy 10 Minutes   Cryotherapy Location Lumbar Spine   Type of Cryotherapy Ice pack     Manual Therapy   Manual Therapy Joint mobilization   Joint Mobilization Grade 3-4 PA mobilization, L1-L5  PT Short Term Goals - 06/22/17 1249      PT SHORT TERM GOAL #1   Title pt to be I with inital HEP   Time 3   Period Weeks   Status New   Target Date 07/13/17     PT SHORT TERM GOAL #2   Title pt to vebalize/ demo proper posture during sitting/ standing and lifting utilizing proper mechanics to reduce and prevent low back pain    Time 3   Period Weeks   Status New   Target Date 07/13/17     PT SHORT TERM GOAL #3   Title pt to demo decreased paraspinal spasm to decrease pain to </= 5/10 and promote trunk mobility    Time 3   Period Weeks   Status New   Target Date 07/13/17           PT Long Term Goals - 06/22/17 1251      PT LONG TERM GOAL #1   Title pt to increase trunk flexion to >/= 60 degrees and bil sidebending to >/= 20 degrees with </= 2/10 pain for functional mobility required for ADLs   Time 6   Period Weeks   Status New   Target Date 08/03/17     PT LONG TERM  GOAL #2   Title pt to stand/walk >/= 1 hour without using an AD with </= 2/10 pain for functional endurance required for work related activities    Time 6   Period Weeks   Status New   Target Date 08/03/17     PT LONG TERM GOAL #3   Title pt to increase FOTO score to </= 49% limited to demo improvement in function   Time 6   Period Weeks   Status New   Target Date 08/03/17     PT LONG TERM GOAL #4   Title pt to be I with all HEP given as of last visit    Time 6   Period Weeks   Status New   Target Date 08/03/17           Plan - 07/05/17 1527    Clinical Impression Statement Pt. presents to clinic with 2/10 pain in low back and still reports pinching sensation in L lumbar spine. Therapy today consisted of endurance exercise, manual therapy, and continued core strengthening. After passive hamstring stretch with contract-relax (pushing heel into resistance), pt. was reassessed and noted relief of pinching sensation in low back. The pt. concluded the session with cryotherapy applied to lumbar spine and reported that pain had improved.   PT Treatment/Interventions ADLs/Self Care Home Management;Cryotherapy;Electrical Stimulation;Iontophoresis 4mg /ml Dexamethasone;Moist Heat;Ultrasound;Therapeutic activities;Therapeutic exercise;Traction;Dry needling;Manual techniques;Patient/family education;Passive range of motion;Balance training;Gait training;Stair training;Taping   PT Next Visit Plan Review and update HEP, manual for low back, core strengthening, possible innominate rotation on the L hamstring stretching hip flexor activation. extension biased tx, modalities PRN   Consulted and Agree with Plan of Care Patient      Patient will benefit from skilled therapeutic intervention in order to improve the following deficits and impairments:  Abnormal gait, Pain, Improper body mechanics, Obesity, Postural dysfunction, Increased fascial restricitons, Increased muscle spasms, Decreased range of  motion, Decreased activity tolerance, Decreased balance, Decreased endurance  Visit Diagnosis: Chronic left-sided low back pain, with sciatica presence unspecified  Muscle spasm of back   Problem List Patient Active Problem List   Diagnosis Date Noted  . Constipation 06/18/2017  . AKI (acute kidney injury) ([place]) 06/02/2017  .  Low back pain 05/14/2017  . Proteinuria 11/22/2016  . Skin lesion 11/12/2015  . Preop exam for internal medicine 01/16/2015  . Cellulitis and abscess of leg 10/26/2013  . DM foot ulcer ([place]) 10/23/2013  . Preventative health care 07/05/2011  . History of colonic polyps 09/05/2009  . COLONIC POLYPS 03/14/2008  . DIVERTICULOSIS, COLON 03/14/2008  . Hyperlipidemia 06/27/2007  . CELLULITIS/ABSCESS, LEG 06/27/2007  . Diabetes ([place]) 05/21/2007  . GOUT NOS 05/21/2007  . Morbid obesity ([place]) 05/21/2007  . ERECTILE DYSFUNCTION 05/21/2007  . OSA (obstructive sleep apnea) 05/21/2007  . Essential hypertension 05/21/2007  . LOW BACK PAIN 05/21/2007    Heavenly Christine, SPT 07/05/2017, 5:24 PM  [facility] [address] [place], [place], [address] Phone: [phone]   Fax:  [phone]  Name: QUANTAVIS OBRYANT MRN: [id number] Date of Birth: [birth year]

## 2017-07-07 ENCOUNTER — Encounter: Payer: Self-pay | Admitting: Physical Therapy

## 2017-07-07 ENCOUNTER — Ambulatory Visit: Payer: PRIVATE HEALTH INSURANCE | Admitting: Physical Therapy

## 2017-07-07 DIAGNOSIS — M545 Low back pain: Secondary | ICD-10-CM | POA: Diagnosis not present

## 2017-07-07 DIAGNOSIS — R2689 Other abnormalities of gait and mobility: Secondary | ICD-10-CM

## 2017-07-07 DIAGNOSIS — R293 Abnormal posture: Secondary | ICD-10-CM

## 2017-07-07 DIAGNOSIS — M62830 Muscle spasm of back: Secondary | ICD-10-CM

## 2017-07-07 DIAGNOSIS — G8929 Other chronic pain: Secondary | ICD-10-CM

## 2017-07-07 NOTE — Therapy (Deleted)
[facility] [place], [place], [address] Phone: [phone]   Fax:  [phone]  Physical Therapy Treatment  Patient Details  Name: Gerald Day MRN: [id number] Date of Birth: 12-15-1955 Referring Provider: Rosemarie Ax, MD  Encounter Date: 07/07/2017      PT End of Session - 07/07/17 1531    Visit Number 5   Number of Visits 13   Date for PT Re-Evaluation 08/03/17   PT Start Time 0623   PT Stop Time 1501   PT Time Calculation (min) 44 min   Activity Tolerance Patient tolerated treatment well;No increased pain   Behavior During Therapy WFL for tasks assessed/performed      Past Medical History:  Diagnosis Date  . Arthritis    IN KNEES  . Cellulitis 10/26/2013   LOWER RT EXTREMITY  . CELLULITIS/ABSCESS, LEG 06/27/2007  . COLONIC POLYPS   . CONGESTIVE HEART FAILURE   . DIABETES MELLITUS, TYPE II   . DIVERTICULOSIS, COLON   . ERECTILE DYSFUNCTION, ORGANIC   . GOUT NOS   . Headache   . HYPERLIPIDEMIA   . HYPERTENSION   . LOW BACK PAIN   . Morbid obesity ([place])   . SLEEP APNEA, OBSTRUCTIVE    USES CPAP    Past Surgical History:  Procedure Laterality Date  . APPENDECTOMY    . FOOT SURGERY     LT  . TENOTOMY / FLEXOR TENDON TRANSFER Right 03/15/2015   Procedure: TENOTOMY HT REPAIR/ULCER DEBRIDEMENT/GRAFT PREP/ACELL GRAFT APPLICATION;  Surgeon: Jana Half, DPM;  Location: [facility];  Service: Podiatry;  Laterality: Right;  HALLUX    There were no vitals filed for this visit.      Subjective Assessment - 07/07/17 1529    Subjective "My pain isn't bad today , but I still have little something in my back. I went to my doctor and he said someting about a buldging disc and L5, but I don't really know what that means." Pt. notes that pain is on left side of back and occurs when transitioning from sit to stand.          [facility] Adult PT Treatment/Exercise - 07/07/17 1518      Lumbar Exercises:  Stretches   Active Hamstring Stretch 2 reps;30 seconds  in sitting, bilateral    Passive Hamstring Stretch 2 reps;30 seconds  supine, LLE only, using contract-relax technique   Pelvic Tilt --  2 sets, x10 reps, holding 3 secs., supine   Press Ups Other (comment)  4 sets, x15 reps, last 2 sets with OP      Lumbar Exercises: Seated   Other Seated Lumbar Exercises lumbar stretch rolling physioball (x4 reps, hold 30 seconds)  x2 reps forward, x 2 reps to the R to stretch L paraspinals   Other Seated Lumbar Exercises lumbar stretch rolling physioball with OP at end-range  forward (x2 reps, hold 30 seconds)     Knee/Hip Exercises: Aerobic   Nustep L5, 5 minutes            PT Education - 07/07/17 1531    Education provided Yes   Education Details education on lumbar spine and vertebral disc anatomy   Person(s) Educated Patient   Methods Explanation;Demonstration   Comprehension Verbalized understanding          PT Short Term Goals - 06/22/17 1249      PT SHORT TERM GOAL #1   Title pt to be I with inital HEP  Time 3   Period Weeks   Status New   Target Date 07/13/17     PT SHORT TERM GOAL #2   Title pt to vebalize/ demo proper posture during sitting/ standing and lifting utilizing proper mechanics to reduce and prevent low back pain    Time 3   Period Weeks   Status New   Target Date 07/13/17     PT SHORT TERM GOAL #3   Title pt to demo decreased paraspinal spasm to decrease pain to </= 5/10 and promote trunk mobility    Time 3   Period Weeks   Status New   Target Date 07/13/17           PT Long Term Goals - 06/22/17 1251      PT LONG TERM GOAL #1   Title pt to increase trunk flexion to >/= 60 degrees and bil sidebending to >/= 20 degrees with </= 2/10 pain for functional mobility required for ADLs   Time 6   Period Weeks   Status New   Target Date 08/03/17     PT LONG TERM GOAL #2   Title pt to stand/walk >/= 1 hour without using an AD with </= 2/10  pain for functional endurance required for work related activities    Time 6   Period Weeks   Status New   Target Date 08/03/17     PT LONG TERM GOAL #3   Title pt to increase FOTO score to </= 49% limited to demo improvement in function   Time 6   Period Weeks   Status New   Target Date 08/03/17     PT LONG TERM GOAL #4   Title pt to be I with all HEP given as of last visit    Time 6   Period Weeks   Status New   Target Date 08/03/17           Plan - 07/07/17 1532    Clinical Impression Statement Pt. presents to the clinic with reported 1/10 pain in left lower back, and still reports persistent pinching sensation especially transitioning from sit to stand. Therapy today consisted of extension biased lumbar exercises and continued streching of the lumbar spine. Pt. sit to stand was assesed after performing seated hamstring strecth and he noted no pinching sensation in back. Pt. was also educated on anatomy of lumbar spine and verterbral disc after recieving MRI from physcian. The pt. declined modalities post-session and noted decreased pain, but could still feel the effects of the lumbar stretching.   PT Treatment/Interventions ADLs/Self Care Home Management;Cryotherapy;Electrical Stimulation;Iontophoresis 4mg /ml Dexamethasone;Moist Heat;Ultrasound;Therapeutic activities;Therapeutic exercise;Traction;Dry needling;Manual techniques;Patient/family education;Passive range of motion;Balance training;Gait training;Stair training;Taping   PT Next Visit Plan Review and update HEP, manual for low back, core strengthening, possible innominate rotation on the L hamstring stretching hip flexor activation. extension biased tx, modalities PRN   Consulted and Agree with Plan of Care Patient      Patient will benefit from skilled therapeutic intervention in order to improve the following deficits and impairments:  Abnormal gait, Pain, Improper body mechanics, Obesity, Postural dysfunction,  Increased fascial restricitons, Increased muscle spasms, Decreased range of motion, Decreased activity tolerance, Decreased balance, Decreased endurance  Visit Diagnosis: Chronic left-sided low back pain, with sciatica presence unspecified  Muscle spasm of back  Chronic bilateral low back pain without sciatica  Abnormal posture  Other abnormalities of gait and mobility     Problem List Patient Active Problem List   Diagnosis  Date Noted  . Constipation 06/18/2017  . AKI (acute kidney injury) ([place]) 06/02/2017  . Low back pain 05/14/2017  . Proteinuria 11/22/2016  . Skin lesion 11/12/2015  . Preop exam for internal medicine 01/16/2015  . Cellulitis and abscess of leg 10/26/2013  . DM foot ulcer ([place]) 10/23/2013  . Preventative health care 07/05/2011  . History of colonic polyps 09/05/2009  . COLONIC POLYPS 03/14/2008  . DIVERTICULOSIS, COLON 03/14/2008  . Hyperlipidemia 06/27/2007  . CELLULITIS/ABSCESS, LEG 06/27/2007  . Diabetes ([place]) 05/21/2007  . GOUT NOS 05/21/2007  . Morbid obesity ([place]) 05/21/2007  . ERECTILE DYSFUNCTION 05/21/2007  . OSA (obstructive sleep apnea) 05/21/2007  . Essential hypertension 05/21/2007  . LOW BACK PAIN 05/21/2007    Eugenio Dollins, SPT 07/07/2017, 3:47 PM  [facility] [address] [place], [place], [address] Phone: [phone]   Fax:  [phone]  Name: Gerald Day MRN: [id number] Date of Birth: August 10, 1956

## 2017-07-07 NOTE — Therapy (Signed)
[facility], [place], [address] Phone: [phone]   Fax:  [phone]  Physical Therapy Treatment  Patient Details  Name: Gerald Day MRN: [id number] Date of Birth: Jul 03, 1956 Referring Provider: Rosemarie Ax, MD  Encounter Date: 07/07/2017      PT End of Session - 07/07/17 1531    Visit Number 5   Number of Visits 13   Date for PT Re-Evaluation 08/03/17   PT Start Time 6389   PT Stop Time 1501   PT Time Calculation (min) 44 min   Activity Tolerance Patient tolerated treatment well;No increased pain   Behavior During Therapy WFL for tasks assessed/performed      Past Medical History:  Diagnosis Date  . Arthritis    IN KNEES  . Cellulitis 10/26/2013   LOWER RT EXTREMITY  . CELLULITIS/ABSCESS, LEG 06/27/2007  . COLONIC POLYPS   . CONGESTIVE HEART FAILURE   . DIABETES MELLITUS, TYPE II   . DIVERTICULOSIS, COLON   . ERECTILE DYSFUNCTION, ORGANIC   . GOUT NOS   . Headache   . HYPERLIPIDEMIA   . HYPERTENSION   . LOW BACK PAIN   . Morbid obesity ([place])   . SLEEP APNEA, OBSTRUCTIVE    USES CPAP    Past Surgical History:  Procedure Laterality Date  . APPENDECTOMY    . FOOT SURGERY     LT  . TENOTOMY / FLEXOR TENDON TRANSFER Right 03/15/2015   Procedure: TENOTOMY HT REPAIR/ULCER DEBRIDEMENT/GRAFT PREP/ACELL GRAFT APPLICATION;  Surgeon: Jana Half, DPM;  Location: [facility];  Service: Podiatry;  Laterality: Right;  HALLUX    There were no vitals filed for this visit.      Subjective Assessment - 07/07/17 1529    Subjective "My pain isn't bad today , but I still have little something in my back. I went to my doctor and he said someting about a buldging disc and L5, but I don't really know what that means." Pt. notes that pain is on left side of back and occurs when transitioning from sit to stand.          [facility] Adult PT Treatment/Exercise - 07/07/17 1518      Lumbar Exercises:  Stretches   Active Hamstring Stretch 2 reps;30 seconds  in sitting, bilateral    Passive Hamstring Stretch 2 reps;30 seconds  supine, LLE only, using contract-relax technique   Pelvic Tilt --  2 sets, x10 reps, holding 3 secs., supine   Press Ups Other (comment)  4 sets, x15 reps, last 2 sets with OP      Lumbar Exercises: Seated   Other Seated Lumbar Exercises lumbar stretch rolling physioball (x4 reps, hold 30 seconds)  x2 reps forward, x 2 reps to the R to stretch L paraspinals   Other Seated Lumbar Exercises lumbar stretch rolling physioball with OP at end-range  forward (x2 reps, hold 30 seconds)     Knee/Hip Exercises: Aerobic   Nustep L5, 5 minutes           PT Education - 07/07/17 1531    Education provided Yes   Education Details education on lumbar spine and vertebral disc anatomy, given during exercise on NuStep machine and again at the end of session   Person(s) Educated Patient   Methods Explanation;Demonstration   Comprehension Verbalized understanding          PT Short Term Goals - 06/22/17 1249      PT SHORT TERM GOAL #  1   Title pt to be I with inital HEP   Time 3   Period Weeks   Status New   Target Date 07/13/17     PT SHORT TERM GOAL #2   Title pt to vebalize/ demo proper posture during sitting/ standing and lifting utilizing proper mechanics to reduce and prevent low back pain    Time 3   Period Weeks   Status New   Target Date 07/13/17     PT SHORT TERM GOAL #3   Title pt to demo decreased paraspinal spasm to decrease pain to </= 5/10 and promote trunk mobility    Time 3   Period Weeks   Status New   Target Date 07/13/17           PT Long Term Goals - 06/22/17 1251      PT LONG TERM GOAL #1   Title pt to increase trunk flexion to >/= 60 degrees and bil sidebending to >/= 20 degrees with </= 2/10 pain for functional mobility required for ADLs   Time 6   Period Weeks   Status New   Target Date 08/03/17     PT LONG TERM GOAL #2    Title pt to stand/walk >/= 1 hour without using an AD with </= 2/10 pain for functional endurance required for work related activities    Time 6   Period Weeks   Status New   Target Date 08/03/17     PT LONG TERM GOAL #3   Title pt to increase FOTO score to </= 49% limited to demo improvement in function   Time 6   Period Weeks   Status New   Target Date 08/03/17     PT LONG TERM GOAL #4   Title pt to be I with all HEP given as of last visit    Time 6   Period Weeks   Status New   Target Date 08/03/17          Plan - 07/07/17 1532    Clinical Impression Statement Pt. presents to the clinic with reported 1/10 pain in left lower back, and still reports persistent pinching sensation especially transitioning from sit to stand. Therapy today consisted of extension biased lumbar exercises and continued streching of the lumbar spine. Pt. sit to stand was assesed after performing seated hamstring strecth and he noted no pinching sensation in back. Pt. was also educated on anatomy of lumbar spine and verterbral disc after recieving MRI from physcian. The pt. declined modalities post-session and noted decreased pain, but could still feel the effects of the lumbar stretching.   PT Treatment/Interventions ADLs/Self Care Home Management;Cryotherapy;Electrical Stimulation;Iontophoresis 4mg /ml Dexamethasone;Moist Heat;Ultrasound;Therapeutic activities;Therapeutic exercise;Traction;Dry needling;Manual techniques;Patient/family education;Passive range of motion;Balance training;Gait training;Stair training;Taping   PT Next Visit Plan Review and update HEP, manual for low back, core strengthening, possible innominate rotation on the L hamstring stretching hip flexor activation. extension biased tx, modalities PRN   Consulted and Agree with Plan of Care Patient      Patient will benefit from skilled therapeutic intervention in order to improve the following deficits and impairments:  Abnormal gait,  Pain, Improper body mechanics, Obesity, Postural dysfunction, Increased fascial restricitons, Increased muscle spasms, Decreased range of motion, Decreased activity tolerance, Decreased balance, Decreased endurance  Visit Diagnosis: Chronic left-sided low back pain, with sciatica presence unspecified  Muscle spasm of back  Chronic bilateral low back pain without sciatica  Abnormal posture  Other abnormalities of gait and mobility  Problem List Patient Active Problem List   Diagnosis Date Noted  . Constipation 06/18/2017  . AKI (acute kidney injury) ([place]) 06/02/2017  . Low back pain 05/14/2017  . Proteinuria 11/22/2016  . Skin lesion 11/12/2015  . Preop exam for internal medicine 01/16/2015  . Cellulitis and abscess of leg 10/26/2013  . DM foot ulcer ([place]) 10/23/2013  . Preventative health care 07/05/2011  . History of colonic polyps 09/05/2009  . COLONIC POLYPS 03/14/2008  . DIVERTICULOSIS, COLON 03/14/2008  . Hyperlipidemia 06/27/2007  . CELLULITIS/ABSCESS, LEG 06/27/2007  . Diabetes ([place]) 05/21/2007  . GOUT NOS 05/21/2007  . Morbid obesity ([place]) 05/21/2007  . ERECTILE DYSFUNCTION 05/21/2007  . OSA (obstructive sleep apnea) 05/21/2007  . Essential hypertension 05/21/2007  . LOW BACK PAIN 05/21/2007    Topanga Alvelo, SPT 07/07/2017, 4:07 PM  [facility] [address] [place], [place], [address] Phone: [phone]   Fax:  [phone]  Name: Gerald Day MRN: [id number] Date of Birth: [birth year]

## 2017-07-09 ENCOUNTER — Ambulatory Visit (INDEPENDENT_AMBULATORY_CARE_PROVIDER_SITE_OTHER): Payer: PRIVATE HEALTH INSURANCE | Admitting: Internal Medicine

## 2017-07-09 ENCOUNTER — Encounter: Payer: Self-pay | Admitting: Internal Medicine

## 2017-07-09 VITALS — BP 144/94 | HR 86 | Temp 98.0°F | Ht 72.0 in | Wt 380.0 lb

## 2017-07-09 DIAGNOSIS — K5900 Constipation, unspecified: Secondary | ICD-10-CM

## 2017-07-09 DIAGNOSIS — M545 Low back pain, unspecified: Secondary | ICD-10-CM

## 2017-07-09 DIAGNOSIS — I10 Essential (primary) hypertension: Secondary | ICD-10-CM

## 2017-07-09 NOTE — Telephone Encounter (Signed)
Forms to continue FMLA have been completed&signed, faxed, copy sent to scan &no charge.

## 2017-07-09 NOTE — Patient Instructions (Addendum)
Please continue all other medications as before, and refills have been done if requested.  Please have the pharmacy call with any other refills you may need.  Please continue your efforts at being more active, low cholesterol diet, and weight control.  Please keep your appointments with your specialists as you may have planned -PT and the orthopedic for Nov 7  Your FMLA form was filled out

## 2017-07-09 NOTE — Progress Notes (Signed)
Subjective:    Patient ID: Gerald Day, male    DOB: 07/23/1956, 61 y.o.   MRN: [id number]  HPI  Here to f/u - Back pain overall improved from last visit, though still gets a "catch" in the AM with twisting sort of worse to get up first in the AM;  Seeing PT 2 x per wk.   Pt denies bowel or bladder change, fever, wt loss,  worsening LE pain/numbness/weakness, gait change or falls, except has been somewhat constipated last few days.  Did start the miralax after alst visi and did ok.   Has appt Nov 7 with orthopedic.  Pt denies chest pain, increased sob or doe, wheezing, orthopnea, PND, increased LE swelling, palpitations, dizziness or syncope.  Pt denies new neurological symptoms such as new headache, or facial or extremity weakness or numbness.  Pt denies polydipsia, polyuria Past Medical History:  Diagnosis Date  . Arthritis    IN KNEES  . Cellulitis 10/26/2013   LOWER RT EXTREMITY  . CELLULITIS/ABSCESS, LEG 06/27/2007  . COLONIC POLYPS   . CONGESTIVE HEART FAILURE   . DIABETES MELLITUS, TYPE II   . DIVERTICULOSIS, COLON   . ERECTILE DYSFUNCTION, ORGANIC   . GOUT NOS   . Headache   . HYPERLIPIDEMIA   . HYPERTENSION   . LOW BACK PAIN   . Morbid obesity ([place])   . SLEEP APNEA, OBSTRUCTIVE    USES CPAP   Past Surgical History:  Procedure Laterality Date  . APPENDECTOMY    . FOOT SURGERY     LT    reports that  has never smoked. he has never used smokeless tobacco. He reports that he drinks about 1.2 oz of alcohol per week. He reports that he does not use drugs. family history includes Asthma in his other; Cardiomyopathy in his father. Allergies  Allergen Reactions  . Penicillins Other (See Comments)    Unknown childhood allergic reaction   Current Outpatient Medications on File Prior to Visit  Medication Sig Dispense Refill  . amLODipine (NORVASC) 10 MG tablet TAKE 1 TABLET (10 MG TOTAL) BY MOUTH DAILY. 90 tablet 1  . atorvastatin (LIPITOR) 10 MG tablet TAKE 1 TABLET (10  MG TOTAL) BY MOUTH DAILY. 90 tablet 1  . cyclobenzaprine (FLEXERIL) 10 MG tablet Take 1 tablet (10 mg total) by mouth 3 (three) times daily as needed for muscle spasms. 50 tablet 0  . furosemide (LASIX) 80 MG tablet Take 1 tablet (80 mg total) by mouth daily as needed. 90 tablet 3  . glipiZIDE (GLUCOTROL XL) 5 MG 24 hr tablet TAKE 1 TABLET BY MOUTH EVERY DAY WITH BREAKFAST 90 tablet 3  . HYDROcodone-acetaminophen (NORCO) 7.5-325 MG tablet Take 1 tablet by mouth every 6 (six) hours as needed for moderate pain. 30 tablet 0  . KLOR-CON M20 20 MEQ tablet TAKE 1 TABLET (20 MEQ TOTAL) BY MOUTH DAILY. 30 tablet 8  . losartan (COZAAR) 50 MG tablet Take 2 tablets (100 mg total) by mouth daily. 180 tablet 3  . meloxicam (MOBIC) 15 MG tablet Take 1 tablet (15 mg total) by mouth daily. 30 tablet 1  . metFORMIN (GLUCOPHAGE-XR) 500 MG 24 hr tablet TAKE 4 TABLETS BY MOUTH PER DAY 360 tablet 2  . traMADol (ULTRAM) 50 MG tablet Take 1 tablet (50 mg total) by mouth every 8 (eight) hours as needed. 60 tablet 0   No current facility-administered medications on file prior to visit.    Review of Systems  Constitutional: Negative  for other unusual diaphoresis or sweats HENT: Negative for ear discharge or swelling Eyes: Negative for other worsening visual disturbances Respiratory: Negative for stridor or other swelling  Gastrointestinal: Negative for worsening distension or other blood Genitourinary: Negative for retention or other urinary change Musculoskeletal: Negative for other MSK pain or swelling Skin: Negative for color change or other new lesions Neurological: Negative for worsening tremors and other numbness  Psychiatric/Behavioral: Negative for worsening agitation or other fatigue All other system neg per pt    Objective:   Physical Exam BP (!) 144/94   Pulse 86   Temp 98 F (36.7 C) (Oral)   Ht 6' (1.829 m)   Wt (!) 380 lb (172.4 kg)   SpO2 99%   BMI 51.54 kg/m  VS noted,  Constitutional: Pt  appears in NAD HENT: Head: NCAT.  Right Ear: External ear normal.  Left Ear: External ear normal.  Eyes: . Pupils are equal, round, and reactive to light. Conjunctivae and EOM are normal Nose: without d/c or deformity Neck: Neck supple. Gross normal ROM Cardiovascular: Normal rate and regular rhythm.   Pulmonary/Chest: Effort normal and breath sounds without rales or wheezing.  Abd:  Soft, NT, ND, + BS, no organomegaly Spine nontender in midline or paravertebral, no swelling or rash Neurological: Pt is alert. At baseline orientation, motor 5/5 intact Skin: Skin is warm. No rashes, other new lesions, trace to 1+ chronic bilat LE edema Psychiatric: Pt behavior is normal without agitation  No other exam findings Lab Results  Component Value Date   WBC 8.7 11/11/2016   HGB 12.9 (L) 11/11/2016   HCT 37.6 (L) 11/11/2016   PLT 254.0 11/11/2016   GLUCOSE 128 (H) 06/11/2017   CHOL 105 05/28/2017   TRIG 105.0 05/28/2017   HDL 46.50 05/28/2017   LDLDIRECT 107.6 05/08/2014   LDLCALC 37 05/28/2017   ALT 21 05/28/2017   AST 14 05/28/2017   NA 137 06/11/2017   K 4.7 06/11/2017   CL 100 06/11/2017   CREATININE 1.16 06/11/2017   BUN 23 06/11/2017   CO2 29 06/11/2017   TSH 2.56 11/11/2016   PSA 0.43 11/11/2016   HGBA1C 6.3 05/28/2017   MICROALBUR >300.0 (H) 11/11/2016      Assessment & Plan:

## 2017-07-11 ENCOUNTER — Encounter: Payer: Self-pay | Admitting: Internal Medicine

## 2017-07-11 NOTE — Assessment & Plan Note (Signed)
Symptomatically improved, cont same tx, f/u with ortho as planned, FMLA form filled out

## 2017-07-11 NOTE — Assessment & Plan Note (Signed)
Mild, for miralax asd daily prn,  to f/u any worsening symptoms or concerns

## 2017-07-11 NOTE — Assessment & Plan Note (Signed)
Mild elevated, pt declines any med change, o/w stable overall by history and exam, recent data reviewed with pt, and pt to continue medical treatment as before,  to f/u any worsening symptoms or concerns BP Readings from Last 3 Encounters:  07/09/17 (!) 144/94  06/18/17 (!) 158/98  06/15/17 138/78

## 2017-07-12 ENCOUNTER — Ambulatory Visit: Payer: PRIVATE HEALTH INSURANCE | Attending: Family Medicine | Admitting: Physical Therapy

## 2017-07-12 ENCOUNTER — Encounter: Payer: Self-pay | Admitting: Physical Therapy

## 2017-07-12 DIAGNOSIS — M62830 Muscle spasm of back: Secondary | ICD-10-CM | POA: Insufficient documentation

## 2017-07-12 DIAGNOSIS — M545 Low back pain: Secondary | ICD-10-CM | POA: Insufficient documentation

## 2017-07-12 DIAGNOSIS — G8929 Other chronic pain: Secondary | ICD-10-CM

## 2017-07-12 NOTE — Therapy (Signed)
[place], [place], [address] Phone: [phone]   Fax:  [phone]  Physical Therapy Treatment/Discharge Summary  Patient Details  Name: Gerald Day MRN: [id number] Date of Birth: March 10, 1956 Referring Provider: Rosemarie Ax, MD   Encounter Date: 07/12/2017  PT End of Session - 07/12/17 1642    Visit Number  6    Number of Visits  13    PT Start Time  1416    PT Stop Time  1459    PT Time Calculation (min)  43 min    Activity Tolerance  Patient tolerated treatment well    Behavior During Therapy  [facility] for tasks assessed/performed       Past Medical History:  Diagnosis Date  . Arthritis    IN KNEES  . Cellulitis 10/26/2013   LOWER RT EXTREMITY  . CELLULITIS/ABSCESS, LEG 06/27/2007  . COLONIC POLYPS   . CONGESTIVE HEART FAILURE   . DIABETES MELLITUS, TYPE II   . DIVERTICULOSIS, COLON   . ERECTILE DYSFUNCTION, ORGANIC   . GOUT NOS   . Headache   . HYPERLIPIDEMIA   . HYPERTENSION   . LOW BACK PAIN   . Morbid obesity ([place])   . SLEEP APNEA, OBSTRUCTIVE    USES CPAP    Past Surgical History:  Procedure Laterality Date  . APPENDECTOMY    . FOOT SURGERY     LT    There were no vitals filed for this visit.  Subjective Assessment - 07/12/17 1419    Subjective  Pt. reports the he is "not really in any pain" and that home exercises are going well. Pt. reports that he feels as thogh he has progressed and inquired about discharging from physical therapy.     Currently in Pain?  No/denies    Pain Score  0-No pain        [facility] PT Assessment - 07/12/17 1445      Observation/Other Assessments   Focus on Therapeutic Outcomes (FOTO)   20% limited       AROM   Lumbar Flexion  26    Lumbar Extension  25    Lumbar - Right Side Bend  17    Lumbar - Left Side Bend  30       [facility] Adult PT Treatment/Exercise - 07/12/17 1426      Therapeutic Activites    Lifting  lifting 32 lbs. box  from chair to shelf using proper lifting and turning body mechanics to simulate return to work 1 set; 10 reps    1 set; 10 reps      Lumbar Exercises: Stretches   Standing Side Bend  2 reps;30 seconds bilaterally   bilaterally     Lumbar Exercises: Aerobic   Tread Mill  10 minutes; 1.0 speed to promote endurance for return to work    to promote endurance for return to work      Lumbar Exercises: Seated   Other Seated Lumbar Exercises  lumbar stretch rolling physioball 2 reps; hold 30 seconds, with overpressure at end-range    2 reps; hold 30 seconds, with overpressure at end-range       PT Education - 07/12/17 1640    Education provided  Yes    Education Details  pt. education on proper lifting mechanincs to avoid re-injury, incorporating exercises into daily activities     Person(s) Educated  Patient    Methods  Explanation;Demonstration;Verbal cues    Comprehension  Verbalized  understanding;Returned demonstration;Verbal cues required       PT Short Term Goals - 07/12/17 1453      PT SHORT TERM GOAL #1   Title  pt to be I with inital HEP    Status  Achieved      PT SHORT TERM GOAL #2   Title  pt to vebalize/ demo proper posture during sitting/ standing and lifting utilizing proper mechanics to reduce and prevent low back pain     Status  Achieved      PT SHORT TERM GOAL #3   Title  pt to demo decreased paraspinal spasm to decrease pain to </= 5/10 and promote trunk mobility     Status  Achieved       PT Long Term Goals - 07/12/17 1454      PT LONG TERM GOAL #1   Title  pt to increase trunk flexion to >/= 60 degrees and bil sidebending to >/= 20 degrees with </= 2/10 pain for functional mobility required for ADLs    Status  Partially Met      PT LONG TERM GOAL #2   Title  pt to stand/walk >/= 1 hour without using an AD with </= 2/10 pain for functional endurance required for work related activities     Status  Achieved      PT LONG TERM GOAL #3   Title  pt to  increase FOTO score to </= 49% limited to demo improvement in function    Status  Achieved      PT LONG TERM GOAL #4   Title  pt to be I with all HEP given as of last visit     Status  Achieved       Plan - 07/12/17 1643    Clinical Impression Statement  Pt. presents today with no reported pain. Pt. indicated he is doing well and inquired about discharge. Session focused on education on proper lifting/body mechanincs for safe return to work and education on prevention of re-injury. Pt. has progressed and met or partially met all goals. Pt. feels he can maintain and improve current level of function independently and is therfore being discharged from physical therapy.    PT Treatment/Interventions  ADLs/Self Care Home Management;Cryotherapy;Electrical Stimulation;Iontophoresis 57m/ml Dexamethasone;Moist Heat;Ultrasound;Therapeutic activities;Therapeutic exercise;Traction;Dry needling;Manual techniques;Patient/family education;Passive range of motion;Balance training;Gait training;Stair training;Taping    PT Next Visit Plan  D/C    Consulted and Agree with Plan of Care  Patient       Patient will benefit from skilled therapeutic intervention in order to improve the following deficits and impairments:  Abnormal gait, Pain, Improper body mechanics, Obesity, Postural dysfunction, Increased fascial restricitons, Increased muscle spasms, Decreased range of motion, Decreased activity tolerance, Decreased balance, Decreased endurance  Visit Diagnosis: Chronic left-sided low back pain, with sciatica presence unspecified  Muscle spasm of back  Chronic bilateral low back pain without sciatica   Problem List Patient Active Problem List   Diagnosis Date Noted  . Constipation 06/18/2017  . AKI ([place] injury) [place] 06/02/2017  . Low back pain 05/14/2017  . Proteinuria 11/22/2016  . Skin lesion 11/12/2015  . Preop exam for internal medicine 01/16/2015  . Cellulitis and abscess of leg  10/26/2013  . DM foot ulcer ([place] 10/23/2013  . Preventative health care 07/05/2011  . History of colonic polyps 09/05/2009  . COLONIC POLYPS 03/14/2008  . DIVERTICULOSIS, COLON 03/14/2008  . Hyperlipidemia 06/27/2007  . CELLULITIS/ABSCESS, LEG 06/27/2007  . Diabetes ([place] 05/21/2007  .  GOUT NOS 05/21/2007  . Morbid obesity ([place]) 05/21/2007  . ERECTILE DYSFUNCTION 05/21/2007  . OSA (obstructive sleep apnea) 05/21/2007  . Essential hypertension 05/21/2007    Eulis Manly, SPT 07/12/17 4:55 PM   [facility] [address] [place], [place], [address] Phone: [phone]   Fax:  [phone]  Name: LEGACY LACIVITA MRN: [id number] Date of Birth: August 31, 1956          PHYSICAL THERAPY DISCHARGE SUMMARY  Visits from Start of Care: 6  Current functional level related to goals / functional outcomes: See goals, FOTO = 20% limited    Remaining deficits: N/A   Education / Equipment: Midwife, HEP handouts   Plan: Patient agrees to discharge.  Patient goals were partially met. Patient is being discharged due to being pleased with the current functional level.  ?????

## 2017-07-14 ENCOUNTER — Encounter: Payer: PRIVATE HEALTH INSURANCE | Admitting: Physical Therapy

## 2017-07-14 ENCOUNTER — Encounter (INDEPENDENT_AMBULATORY_CARE_PROVIDER_SITE_OTHER): Payer: Self-pay | Admitting: Physical Medicine and Rehabilitation

## 2017-07-14 ENCOUNTER — Ambulatory Visit (INDEPENDENT_AMBULATORY_CARE_PROVIDER_SITE_OTHER): Payer: PRIVATE HEALTH INSURANCE | Admitting: Physical Medicine and Rehabilitation

## 2017-07-14 DIAGNOSIS — M545 Low back pain, unspecified: Secondary | ICD-10-CM

## 2017-07-14 DIAGNOSIS — M5126 Other intervertebral disc displacement, lumbar region: Secondary | ICD-10-CM | POA: Diagnosis not present

## 2017-07-14 DIAGNOSIS — G8929 Other chronic pain: Secondary | ICD-10-CM | POA: Diagnosis not present

## 2017-07-14 NOTE — Progress Notes (Signed)
Gerald Day - 61 y.o. male MRN [id number]  Date of birth: 14-Aug-1956  Office Visit Note: Visit Date: 07/14/2017 PCP: Biagio Borg, MD Referred by: Biagio Borg, MD  Subjective: Chief Complaint  Patient presents with  . Lower Back - Pain   HPI: Gerald Day is a very pleasant 61 year old gentleman by his wife who provides some of the history.  He comes in today at the request of his primary care physician or Dr. Jeneen Rinks.  He reports acute onset of back pain that began in the early part of September.  He saw his primary care physician who provided tramadol as well as Flexeril.  The patient reports episodic severe back pain about once every 6 months this is been going on for some time.  Usually is very short-lived and recovers with rest and anti-inflammatory.  He reports this time it was a little bit more severe and it is definitely lasted longer.  He said when the pain began it was more left-sided low back pain right at the belt line.  He had some referral a little bit below the belt line but nothing down the legs.  He has had no numbness tingling or paresthesias down the legs and no focal weakness.  He did say he had a hard time sitting for a length of time or standing for a length of time.  He was at a point where he just could not work because of his back pain.  He was very concerned because usually this is gone away quicker.  He continued to see his primary care physician who kept him on meloxicam and then switched him from tramadol to hydrocodone because of the worsening pain.  X-rays were obtained which were unrevealing other than osteoarthritic changes.  He was then also given a prednisone taper that did seem to help quite a bit over time he has gotten some relief of his symptoms.  He was also started on physical therapy.  Ultimately an MRI of the lumbar spine was performed and this is reviewed below.  The patient continues to do exercises from the physical therapy.  He is doing a lot better  than when it first started.  In terms of the back pain he felt like a deep aching pain.  He had no specific trauma.  He had no fevers or chills or night sweats.  No unexplained weight loss.  He really had no red flag complaints.  He has had no prior lumbar surgery.  He has had no bowel or bladder issues.  He did get some constipation after the hydrocodone.  His case is complicated by diabetes with some complications as well as hypertension.    Review of Systems  Constitutional: Negative for chills, fever, malaise/fatigue and weight loss.  HENT: Negative for hearing loss and sinus pain.   Eyes: Negative for blurred vision, double vision and photophobia.  Respiratory: Negative for cough and shortness of breath.   Cardiovascular: Negative for chest pain, palpitations and leg swelling.  Gastrointestinal: Negative for abdominal pain, nausea and vomiting.  Genitourinary: Negative for flank pain.  Musculoskeletal: Positive for back pain. Negative for myalgias.  Skin: Negative for itching and rash.  Neurological: Negative for tremors, focal weakness and weakness.  Endo/Heme/Allergies: Negative.   Psychiatric/Behavioral: Negative for depression.  All other systems reviewed and are negative.  Otherwise per HPI.  Assessment & Plan: Visit Diagnoses:  1. Lumbar herniated disc   2. Acute midline low back pain without sciatica  3. Chronic midline low back pain without sciatica     Plan: Findings:  Chronic history of episodic low back pain which is likely stress strain type of injury usually to the disc but could be facet mediated.  These tend to occur in large part of the population are usually self-limited.  At this point has not seemed to go away as quick as he like but he is doing much better at this point.  He feels like physical therapy has been helping as well as time in the medication.  I did review the MRI with him and his physical exam is fairly benign.  I think he is having a flareup to the  disc herniation at L3-4.  There is no focal compression but this may have caused a lot of spasm and initial pain.  In all likelihood this will resorb to a degree even though it is fairly small.  He also has arthritis of the lower spine.  This is more right-sided than left but obviously pain does not necessarily go along with the images.  He did have some rotational component to the pain as well.  We talked about all these in great length.  He should continue with the exercises provided to him by physical therapy.  He needs to work on core strengthening and we went over this as well and gave him some other exercises to look at.  He will likely have further episodic issues that she is trying to prevent these are trying to reduce the frequency.  Obviously if the symptoms recur or get worse and we would look at probably epidural injection but right now he is doing well enough that I think we can just watch and see how he does.  He does not have anything in the spine to be really concerned about he actually has a pretty good anatomic spine with 1 disc protrusion at L3-4 and some arthritis.  He has no listhesis or any other concerning factors.  We did talk about lifting to the point of Valsalva and I explained that.  He should really avoid that just to try to maintain this disc not getting worse.  He obviously does not have anything spine related that would warrant chronic opioid management.    Meds & Orders: No orders of the defined types were placed in this encounter.  No orders of the defined types were placed in this encounter.   Follow-up: Return if symptoms worsen or fail to improve.   Procedures: No procedures performed  No notes on file   Clinical History: MRI LUMBAR SPINE WITHOUT CONTRAST  TECHNIQUE: Multiplanar, multisequence MR imaging of the lumbar spine was performed. No intravenous contrast was administered.  COMPARISON:  None.  FINDINGS: Segmentation:  Standard.  Alignment:   Physiologic.  Vertebrae:  No fracture, evidence of discitis, or bone lesion.  Conus medullaris: Extends to the L1 level and appears normal.  Paraspinal and other soft tissues: No paraspinal abnormality.  Disc levels:  Disc spaces: Disc desiccation at L3-4, L4-5 and L5-S1.  T12-L1: No significant disc bulge. No evidence of neural foraminal stenosis. No central canal stenosis.  L1-L2: No significant disc bulge. No evidence of neural foraminal stenosis. No central canal stenosis.  L2-L3: No significant disc bulge. Mild left facet arthropathy. No evidence of neural foraminal stenosis. No central canal stenosis.  L3-L4: Central disc protrusion impressing on the thecal sac. No evidence of neural foraminal stenosis. No central canal stenosis.  L4-L5: Mild broad-based disc bulge.  Right facet arthropathy with a small effusion. No evidence of neural foraminal stenosis. No central canal stenosis.  L5-S1: Mild broad-based disc bulge. Mild left facet arthropathy with mild left foraminal stenosis. No central canal stenosis.  IMPRESSION: 1. At L3-4 there is a central disc protrusion impressing on the thecal sac. 2. At L5-S1 there is a mild broad-based disc bulge. Mild left facet arthropathy with mild left foraminal stenosis.   Electronically Signed   By: Kathreen Devoid   On: 06/26/2017 09:37  He reports that  has never smoked. he has never used smokeless tobacco.  Recent Labs    11/11/16 0725 05/28/17 0726  HGBA1C 6.9* 6.3    Objective:  VS:  HT:    WT:   BMI:     BP:   HR: bpm  TEMP: ( )  RESP:  Physical Exam  Constitutional: He is oriented to person, place, and time. He appears well-developed and well-nourished. No distress.  HENT:  Head: Normocephalic and atraumatic.  Nose: Nose normal.  Mouth/Throat: Oropharynx is clear and moist.  Eyes: Conjunctivae are normal. Pupils are equal, round, and reactive to light.  Neck: Normal range of motion. Neck supple.    Cardiovascular: Regular rhythm and intact distal pulses.  Pulmonary/Chest: Effort normal and breath sounds normal.  Abdominal: Soft. He exhibits no distension.  Musculoskeletal: He exhibits no deformity.  Examination of the lumbar spine shows the patient to have a stiff lumbar spine concordant pain with extension rotation of the lumbar spine left more than right.  He has some focal tightness in the paraspinal and quadratus lumborum region.  This reproduces some of his pain.  He has no pain over the greater trochanters.  He has no pain with hip rotation.  He has good distal strength without any deficits bilaterally.  He has no clonus bilaterally.  He has 2+ muscle stretch reflexes at the quadriceps and trace at the ankles bilaterally.  Neurological: He is alert and oriented to person, place, and time. No sensory deficit. Coordination normal.  Skin: Skin is warm. No rash noted.  Psychiatric: He has a normal mood and affect. His behavior is normal.  Nursing note and vitals reviewed.   Ortho Exam Imaging: No results found.  Past Medical/Family/Surgical/Social History: Medications & Allergies reviewed per EMR Patient Active Problem List   Diagnosis Date Noted  . Constipation 06/18/2017  . AKI (acute kidney injury) ([place]) 06/02/2017  . Low back pain 05/14/2017  . Proteinuria 11/22/2016  . Skin lesion 11/12/2015  . Preop exam for internal medicine 01/16/2015  . Cellulitis and abscess of leg 10/26/2013  . DM foot ulcer ([place]) 10/23/2013  . Preventative health care 07/05/2011  . History of colonic polyps 09/05/2009  . COLONIC POLYPS 03/14/2008  . DIVERTICULOSIS, COLON 03/14/2008  . Hyperlipidemia 06/27/2007  . CELLULITIS/ABSCESS, LEG 06/27/2007  . Diabetes ([place]) 05/21/2007  . GOUT NOS 05/21/2007  . Morbid obesity ([place]) 05/21/2007  . ERECTILE DYSFUNCTION 05/21/2007  . OSA (obstructive sleep apnea) 05/21/2007  . Essential hypertension 05/21/2007   Past Medical History:  Diagnosis Date   . Arthritis    IN KNEES  . Cellulitis 10/26/2013   LOWER RT EXTREMITY  . CELLULITIS/ABSCESS, LEG 06/27/2007  . COLONIC POLYPS   . CONGESTIVE HEART FAILURE   . DIABETES MELLITUS, TYPE II   . DIVERTICULOSIS, COLON   . ERECTILE DYSFUNCTION, ORGANIC   . GOUT NOS   . Headache   . HYPERLIPIDEMIA   . HYPERTENSION   . LOW BACK PAIN   .  Morbid obesity ([place])   . SLEEP APNEA, OBSTRUCTIVE    USES CPAP   Family History  Problem Relation Age of Onset  . Cardiomyopathy Father   . Asthma Other    Past Surgical History:  Procedure Laterality Date  . APPENDECTOMY    . FOOT SURGERY     LT   Social History   Occupational History  . Not on file  Tobacco Use  . Smoking status: Never Smoker  . Smokeless tobacco: Never Used  Substance and Sexual Activity  . Alcohol use: Yes    Alcohol/week: 1.2 oz    Types: 2 Cans of beer per week    Comment: 6 beers a week  . Drug use: No  . Sexual activity: Not on file

## 2017-07-14 NOTE — Progress Notes (Deleted)
Lower back pain. Not having a lot of pain right now. Physical therapy has been helping. Pain was mostly left sided. Pain did not radiate.

## 2017-07-19 ENCOUNTER — Encounter: Payer: PRIVATE HEALTH INSURANCE | Admitting: Physical Therapy

## 2017-07-21 ENCOUNTER — Encounter: Payer: PRIVATE HEALTH INSURANCE | Admitting: Physical Therapy

## 2017-07-21 ENCOUNTER — Encounter (INDEPENDENT_AMBULATORY_CARE_PROVIDER_SITE_OTHER): Payer: Self-pay | Admitting: Physical Medicine and Rehabilitation

## 2017-07-22 ENCOUNTER — Telehealth: Payer: Self-pay | Admitting: Internal Medicine

## 2017-07-22 NOTE — Telephone Encounter (Signed)
Is this letter appropriate? If so, I can do.

## 2017-07-22 NOTE — Telephone Encounter (Signed)
Ok for note 

## 2017-07-22 NOTE — Telephone Encounter (Signed)
Pt called requesting a letter from Dr Jenny Reichmann stating that he is okay to return to work on 07/27/17.

## 2017-07-23 NOTE — Telephone Encounter (Signed)
Letter is done.

## 2017-07-23 NOTE — Telephone Encounter (Signed)
Left message letting pt know. 

## 2017-07-29 ENCOUNTER — Other Ambulatory Visit: Payer: Self-pay | Admitting: Internal Medicine

## 2017-09-08 ENCOUNTER — Other Ambulatory Visit: Payer: Self-pay | Admitting: Internal Medicine

## 2017-09-15 ENCOUNTER — Other Ambulatory Visit: Payer: Self-pay | Admitting: Nephrology

## 2017-09-15 DIAGNOSIS — N179 Acute kidney failure, unspecified: Secondary | ICD-10-CM

## 2017-09-16 ENCOUNTER — Other Ambulatory Visit (HOSPITAL_COMMUNITY): Payer: Self-pay | Admitting: Nephrology

## 2017-09-16 DIAGNOSIS — R809 Proteinuria, unspecified: Secondary | ICD-10-CM

## 2017-09-16 DIAGNOSIS — N182 Chronic kidney disease, stage 2 (mild): Secondary | ICD-10-CM

## 2017-09-16 DIAGNOSIS — N179 Acute kidney failure, unspecified: Secondary | ICD-10-CM

## 2017-09-16 DIAGNOSIS — I10 Essential (primary) hypertension: Secondary | ICD-10-CM

## 2017-09-21 ENCOUNTER — Ambulatory Visit
Admission: RE | Admit: 2017-09-21 | Discharge: 2017-09-21 | Disposition: A | Discharge status: Home / Self Care | Payer: PRIVATE HEALTH INSURANCE | Source: Ambulatory Visit | Attending: Nephrology | Admitting: Nephrology

## 2017-09-21 ENCOUNTER — Other Ambulatory Visit: Payer: PRIVATE HEALTH INSURANCE

## 2017-09-21 DIAGNOSIS — N179 Acute kidney failure, unspecified: Secondary | ICD-10-CM

## 2017-10-11 ENCOUNTER — Encounter (INDEPENDENT_AMBULATORY_CARE_PROVIDER_SITE_OTHER): Payer: Self-pay | Admitting: Orthopaedic Surgery

## 2017-10-11 ENCOUNTER — Telehealth (INDEPENDENT_AMBULATORY_CARE_PROVIDER_SITE_OTHER): Payer: Self-pay

## 2017-10-11 ENCOUNTER — Ambulatory Visit (INDEPENDENT_AMBULATORY_CARE_PROVIDER_SITE_OTHER): Payer: Self-pay

## 2017-10-11 ENCOUNTER — Ambulatory Visit (INDEPENDENT_AMBULATORY_CARE_PROVIDER_SITE_OTHER): Payer: PRIVATE HEALTH INSURANCE

## 2017-10-11 ENCOUNTER — Ambulatory Visit (INDEPENDENT_AMBULATORY_CARE_PROVIDER_SITE_OTHER): Payer: PRIVATE HEALTH INSURANCE | Admitting: Orthopaedic Surgery

## 2017-10-11 DIAGNOSIS — M25561 Pain in right knee: Secondary | ICD-10-CM | POA: Diagnosis not present

## 2017-10-11 DIAGNOSIS — M25562 Pain in left knee: Secondary | ICD-10-CM

## 2017-10-11 DIAGNOSIS — G8929 Other chronic pain: Secondary | ICD-10-CM | POA: Diagnosis not present

## 2017-10-11 MED ORDER — DICLOFENAC SODIUM 1 % TD GEL: 2.0000 g | Freq: Four times a day (QID) | TRANSDERMAL | 5 refills | Status: DC

## 2017-10-11 NOTE — Telephone Encounter (Signed)
We need to submit application for Monovisc injection. Please submit. Thank you.   Leslie BIL KNEE DR Erlinda Hong

## 2017-10-11 NOTE — Progress Notes (Signed)
Office Visit Note   Patient: Gerald Day           Date of Birth: 03-Jun-1956           MRN: [id number] Visit Date: 10/11/2017              Requested by: Biagio Borg, MD [place], [place] [address] PCP: Biagio Borg, MD   Assessment & Plan: Visit Diagnoses:  1. Chronic pain of both knees     Plan: Impression is 62 year old gentleman with morbid obesity and tricompartmental degenerative joint disease.  We discussed the importance of weight loss not only for his orthopedic related issues but also just general health.  He does have diabetes and high blood pressure congestive heart failure and kidney disease.  Prescription for Voltaren gel.  I attempted a right knee injection but was unable to do so given the large body habitus therefore I will refer him to Dr. Ernestina Patches for fluoroscopic guided injection of both knees.  We will also submit preauthorization for HA injection.  Follow-Up Instructions: Return if symptoms worsen or fail to improve.   Orders:  Orders Placed This Encounter  Procedures  . XR KNEE 3 VIEW RIGHT  . XR KNEE 3 VIEW LEFT   Meds ordered this encounter  Medications  . diclofenac sodium (VOLTAREN) 1 % GEL    Sig: Apply 2 g topically 4 (four) times daily.    Dispense:  1 Tube    Refill:  5      Procedures: No procedures performed   Clinical Data: No additional findings.   Subjective: Chief Complaint  Patient presents with  . Left Knee - Pain  . Right Knee - Pain    Patient is a very pleasant 62 year old gentleman who comes in with bilateral knee pain for years.  He reports chronic aching pain that is constant.  Denies any numbness and tingling or mechanical symptoms.  He does endorse popping.    Review of Systems  Constitutional: Negative.   All other systems reviewed and are negative.    Objective: Vital Signs: There were no vitals taken for this visit.  Physical Exam  Constitutional: He is oriented to person, place,  and time. He appears well-developed and well-nourished.  HENT:  Head: Normocephalic and atraumatic.  Eyes: Pupils are equal, round, and reactive to light.  Neck: Neck supple.  Pulmonary/Chest: Effort normal.  Abdominal: Soft.  Musculoskeletal: Normal range of motion.  Neurological: He is alert and oriented to person, place, and time.  Skin: Skin is warm.  Psychiatric: He has a normal mood and affect. His behavior is normal. Judgment and thought content normal.  Nursing note and vitals reviewed.   Ortho Exam Bilateral knee exam is limited by body habitus.  He does have dependent pitting edema.  He has venous stasis skin changes in his lower extremities.  Collaterals and cruciates are grossly intact. Specialty Comments:  No specialty comments available.  Imaging: Xr Knee 3 View Left  Result Date: 10/11/2017 Tricompartmental degenerative joint disease  Xr Knee 3 View Right  Result Date: 10/11/2017 Tricompartmental degenerative joint disease      PMFS History: Patient Active Problem List   Diagnosis Date Noted  . Constipation 06/18/2017  . AKI (acute kidney injury) ([place]) 06/02/2017  . Low back pain 05/14/2017  . Proteinuria 11/22/2016  . Skin lesion 11/12/2015  . Preop exam for internal medicine 01/16/2015  . Cellulitis and abscess of leg 10/26/2013  .  DM foot ulcer ([place]) 10/23/2013  . Preventative health care 07/05/2011  . History of colonic polyps 09/05/2009  . COLONIC POLYPS 03/14/2008  . DIVERTICULOSIS, COLON 03/14/2008  . Hyperlipidemia 06/27/2007  . CELLULITIS/ABSCESS, LEG 06/27/2007  . Diabetes ([place]) 05/21/2007  . GOUT NOS 05/21/2007  . Morbid obesity ([place]) 05/21/2007  . ERECTILE DYSFUNCTION 05/21/2007  . OSA (obstructive sleep apnea) 05/21/2007  . Essential hypertension 05/21/2007   Past Medical History:  Diagnosis Date  . Arthritis    IN KNEES  . Cellulitis 10/26/2013   LOWER RT EXTREMITY  . CELLULITIS/ABSCESS, LEG 06/27/2007  . COLONIC POLYPS   .  CONGESTIVE HEART FAILURE   . DIABETES MELLITUS, TYPE II   . DIVERTICULOSIS, COLON   . ERECTILE DYSFUNCTION, ORGANIC   . GOUT NOS   . Headache   . HYPERLIPIDEMIA   . HYPERTENSION   . LOW BACK PAIN   . Morbid obesity ([place])   . SLEEP APNEA, OBSTRUCTIVE    USES CPAP    Family History  Problem Relation Age of Onset  . Cardiomyopathy Father   . Asthma Other     Past Surgical History:  Procedure Laterality Date  . APPENDECTOMY    . FOOT SURGERY     LT  . TENOTOMY / FLEXOR TENDON TRANSFER Right 03/15/2015   Procedure: TENOTOMY HT REPAIR/ULCER DEBRIDEMENT/GRAFT PREP/ACELL GRAFT APPLICATION;  Surgeon: Jana Half, DPM;  Location: [facility];  Service: Podiatry;  Laterality: Right;  [place]   Social History   Occupational History  . Not on file  Tobacco Use  . Smoking status: Never Smoker  . Smokeless tobacco: Never Used  Substance and Sexual Activity  . Alcohol use: Yes    Alcohol/week: 1.2 oz    Types: 2 Cans of beer per week    Comment: 6 beers a week  . Drug use: No  . Sexual activity: Not on file

## 2017-10-13 ENCOUNTER — Telehealth (INDEPENDENT_AMBULATORY_CARE_PROVIDER_SITE_OTHER): Payer: Self-pay

## 2017-10-13 NOTE — Telephone Encounter (Signed)
Submitted Immunologist

## 2017-10-13 NOTE — Telephone Encounter (Signed)
Submitted Immunologist.

## 2017-10-14 ENCOUNTER — Telehealth (INDEPENDENT_AMBULATORY_CARE_PROVIDER_SITE_OTHER): Payer: Self-pay

## 2017-10-14 NOTE — Telephone Encounter (Addendum)
Talked with patient's wife and advised her that patient is approved for Monovisc injection for bilateral knee, [employer].  Appt.is scheduled for 11/23/17.

## 2017-10-27 ENCOUNTER — Telehealth (INDEPENDENT_AMBULATORY_CARE_PROVIDER_SITE_OTHER): Payer: Self-pay | Admitting: Radiology

## 2017-10-27 ENCOUNTER — Ambulatory Visit (INDEPENDENT_AMBULATORY_CARE_PROVIDER_SITE_OTHER): Payer: PRIVATE HEALTH INSURANCE | Admitting: Physical Medicine and Rehabilitation

## 2017-10-27 ENCOUNTER — Ambulatory Visit (INDEPENDENT_AMBULATORY_CARE_PROVIDER_SITE_OTHER): Payer: Self-pay

## 2017-10-27 ENCOUNTER — Encounter (INDEPENDENT_AMBULATORY_CARE_PROVIDER_SITE_OTHER): Payer: Self-pay | Admitting: Physical Medicine and Rehabilitation

## 2017-10-27 DIAGNOSIS — M25561 Pain in right knee: Secondary | ICD-10-CM | POA: Diagnosis not present

## 2017-10-27 DIAGNOSIS — M25562 Pain in left knee: Secondary | ICD-10-CM | POA: Diagnosis not present

## 2017-10-27 DIAGNOSIS — G8929 Other chronic pain: Secondary | ICD-10-CM

## 2017-10-27 NOTE — Patient Instructions (Signed)

## 2017-10-27 NOTE — Progress Notes (Deleted)
Pt states a sharp constant pain in both right and left knee. Pt states pain started a year ago. Pt states normal activities makes pain worse, topical gel eases pain. -Dye Allergies.

## 2017-10-27 NOTE — Progress Notes (Signed)
Gerald Day - 62 y.o. male MRN [id number]  Date of birth: 25-Jul-1956  Office Visit Note: Visit Date: 10/27/2017 PCP: Biagio Borg, MD Referred by: Biagio Borg, MD  Subjective: Chief Complaint  Patient presents with  . Right Knee - Pain  . Left Knee - Pain   HPI: Gerald Day is a 62 year old gentleman who unfortunately is quite obese.  He comes in today at the request of Dr. Erlinda Hong for bilateral knee injections with fluoroscopic guidance.  This is being done because of the patient's body habitus and Dr. Erlinda Hong tried injection without success without imaging.  Patient is having sharp constant pain in both the right and left knee this started approximately a year ago.  He reports topical anti-inflammatory helps a little bit.  He reports prior injections have helped greatly in the past.  He does report being approved for Synvisc type injections.    ROS Otherwise per HPI.  Assessment & Plan: Visit Diagnoses: No diagnosis found.  Plan: Findings:  Bilateral knee injections with fluoroscopic guidance.  Patient did have relief during the anesthetic phase of the injections.    Meds & Orders: No orders of the defined types were placed in this encounter.  No orders of the defined types were placed in this encounter.   Follow-up: No Follow-up on file.   Procedures: Large Joint Inj: R knee on 10/27/2017 3:33 PM Indications: pain and diagnostic evaluation Details: 22 G 3.5 in needle, fluoroscopy-guided anterior approach  Arthrogram: Yes  Medications: 3 mL bupivacaine 0.5 %; 60 mg triamcinolone acetonide 40 MG/ML Outcome: tolerated well, no immediate complications Procedure, treatment alternatives, risks and benefits explained, specific risks discussed. Consent was given by the parent. Immediately prior to procedure a time out was called to verify the correct patient, procedure, equipment, support staff and site/side marked as required. Patient was prepped and draped in the usual sterile  fashion.   Large Joint Inj: R knee on 10/27/2017 3:36 PM Indications: pain and diagnostic evaluation Details: 22 G 3.5 in needle, fluoroscopy-guided anterior approach  Arthrogram: Yes  Medications: 60 mg triamcinolone acetonide 40 MG/ML; 3 mL bupivacaine 0.5 % Outcome: tolerated well, no immediate complications Procedure, treatment alternatives, risks and benefits explained, specific risks discussed. Consent was given by the parent. Immediately prior to procedure a time out was called to verify the correct patient, procedure, equipment, support staff and site/side marked as required. Patient was prepped and draped in the usual sterile fashion.      No notes on file   Clinical History: MRI LUMBAR SPINE WITHOUT CONTRAST  TECHNIQUE: Multiplanar, multisequence MR imaging of the lumbar spine was performed. No intravenous contrast was administered.  COMPARISON:  None.  FINDINGS: Segmentation:  Standard.  Alignment:  Physiologic.  Vertebrae:  No fracture, evidence of discitis, or bone lesion.  Conus medullaris: Extends to the L1 level and appears normal.  Paraspinal and other soft tissues: No paraspinal abnormality.  Disc levels:  Disc spaces: Disc desiccation at L3-4, L4-5 and L5-S1.  T12-L1: No significant disc bulge. No evidence of neural foraminal stenosis. No central canal stenosis.  L1-L2: No significant disc bulge. No evidence of neural foraminal stenosis. No central canal stenosis.  L2-L3: No significant disc bulge. Mild left facet arthropathy. No evidence of neural foraminal stenosis. No central canal stenosis.  L3-L4: Central disc protrusion impressing on the thecal sac. No evidence of neural foraminal stenosis. No central canal stenosis.  L4-L5: Mild broad-based disc bulge. Right facet arthropathy with a small effusion.  No evidence of neural foraminal stenosis. No central canal stenosis.  L5-S1: Mild broad-based disc bulge. Mild left facet  arthropathy with mild left foraminal stenosis. No central canal stenosis.  IMPRESSION: 1. At L3-4 there is a central disc protrusion impressing on the thecal sac. 2. At L5-S1 there is a mild broad-based disc bulge. Mild left facet arthropathy with mild left foraminal stenosis.   Electronically Signed   By: Kathreen Devoid   On: 06/26/2017 09:37  He reports that  has never smoked. he has never used smokeless tobacco.  Recent Labs    11/11/16 0725 05/28/17 0726  HGBA1C 6.9* 6.3    Objective:  VS:  HT:    WT:   BMI:     BP:   HR: bpm  TEMP: ( )  RESP:  Physical Exam  Ortho Exam Imaging: No results found.  Past Medical/Family/Surgical/Social History: Medications & Allergies reviewed per EMR Patient Active Problem List   Diagnosis Date Noted  . Constipation 06/18/2017  . AKI (acute kidney injury) ([place]) 06/02/2017  . Low back pain 05/14/2017  . Proteinuria 11/22/2016  . Skin lesion 11/12/2015  . Preop exam for internal medicine 01/16/2015  . Cellulitis and abscess of leg 10/26/2013  . DM foot ulcer ([place]) 10/23/2013  . Preventative health care 07/05/2011  . History of colonic polyps 09/05/2009  . COLONIC POLYPS 03/14/2008  . DIVERTICULOSIS, COLON 03/14/2008  . Hyperlipidemia 06/27/2007  . CELLULITIS/ABSCESS, LEG 06/27/2007  . Diabetes ([place]) 05/21/2007  . GOUT NOS 05/21/2007  . Morbid obesity ([place]) 05/21/2007  . ERECTILE DYSFUNCTION 05/21/2007  . OSA (obstructive sleep apnea) 05/21/2007  . Essential hypertension 05/21/2007   Past Medical History:  Diagnosis Date  . Arthritis    IN KNEES  . Cellulitis 10/26/2013   LOWER RT EXTREMITY  . CELLULITIS/ABSCESS, LEG 06/27/2007  . COLONIC POLYPS   . CONGESTIVE HEART FAILURE   . DIABETES MELLITUS, TYPE II   . DIVERTICULOSIS, COLON   . ERECTILE DYSFUNCTION, ORGANIC   . GOUT NOS   . Headache   . HYPERLIPIDEMIA   . HYPERTENSION   . LOW BACK PAIN   . Morbid obesity ([place])   . SLEEP APNEA, OBSTRUCTIVE    USES CPAP     Family History  Problem Relation Age of Onset  . Cardiomyopathy Father   . Asthma Other    Past Surgical History:  Procedure Laterality Date  . APPENDECTOMY    . FOOT SURGERY     LT  . TENOTOMY / FLEXOR TENDON TRANSFER Right 03/15/2015   Procedure: TENOTOMY HT REPAIR/ULCER DEBRIDEMENT/GRAFT PREP/ACELL GRAFT APPLICATION;  Surgeon: Jana Half, DPM;  Location: [place];  Service: Podiatry;  Laterality: Right;  HALLUX   Social History   Occupational History  . Not on file  Tobacco Use  . Smoking status: Never Smoker  . Smokeless tobacco: Never Used  Substance and Sexual Activity  . Alcohol use: Yes    Alcohol/week: 1.2 oz    Types: 2 Cans of beer per week    Comment: 6 beers a week  . Drug use: No  . Sexual activity: Not on file

## 2017-10-28 ENCOUNTER — Other Ambulatory Visit: Payer: Self-pay | Admitting: Radiology

## 2017-10-28 NOTE — Telephone Encounter (Signed)
Yes best to have newton do monovisc also.  But I would tell patient to wait until cortisone wears off before doing monovisc though.

## 2017-10-28 NOTE — Telephone Encounter (Signed)
Tried to call patient no answer. 

## 2017-10-28 NOTE — Telephone Encounter (Signed)
Do you want Dr Ernestina Patches  To do monovisc injections?

## 2017-10-29 NOTE — Telephone Encounter (Signed)
Tried to call patient to advise no answer.4  See message below.

## 2017-11-01 ENCOUNTER — Encounter (HOSPITAL_COMMUNITY): Payer: Self-pay

## 2017-11-01 ENCOUNTER — Ambulatory Visit (HOSPITAL_COMMUNITY)
Admission: RE | Admit: 2017-11-01 | Discharge: 2017-11-01 | Disposition: A | Discharge status: Home / Self Care | Payer: PRIVATE HEALTH INSURANCE | Source: Ambulatory Visit | Attending: Nephrology | Admitting: Nephrology

## 2017-11-01 DIAGNOSIS — I509 Heart failure, unspecified: Secondary | ICD-10-CM | POA: Insufficient documentation

## 2017-11-01 DIAGNOSIS — I130 Hypertensive heart and chronic kidney disease with heart failure and stage 1 through stage 4 chronic kidney disease, or unspecified chronic kidney disease: Secondary | ICD-10-CM | POA: Insufficient documentation

## 2017-11-01 DIAGNOSIS — E1122 Type 2 diabetes mellitus with diabetic chronic kidney disease: Secondary | ICD-10-CM | POA: Insufficient documentation

## 2017-11-01 DIAGNOSIS — R809 Proteinuria, unspecified: Secondary | ICD-10-CM | POA: Insufficient documentation

## 2017-11-01 DIAGNOSIS — N182 Chronic kidney disease, stage 2 (mild): Secondary | ICD-10-CM | POA: Diagnosis not present

## 2017-11-01 DIAGNOSIS — I10 Essential (primary) hypertension: Secondary | ICD-10-CM

## 2017-11-01 DIAGNOSIS — Z7984 Long term (current) use of oral hypoglycemic drugs: Secondary | ICD-10-CM | POA: Diagnosis not present

## 2017-11-01 DIAGNOSIS — N179 Acute kidney failure, unspecified: Secondary | ICD-10-CM

## 2017-11-01 DIAGNOSIS — Z79899 Other long term (current) drug therapy: Secondary | ICD-10-CM | POA: Diagnosis not present

## 2017-11-01 DIAGNOSIS — M109 Gout, unspecified: Secondary | ICD-10-CM | POA: Diagnosis not present

## 2017-11-01 DIAGNOSIS — G4733 Obstructive sleep apnea (adult) (pediatric): Secondary | ICD-10-CM | POA: Insufficient documentation

## 2017-11-01 DIAGNOSIS — E785 Hyperlipidemia, unspecified: Secondary | ICD-10-CM | POA: Insufficient documentation

## 2017-11-01 LAB — PROTIME-INR
INR: 1.03
Prothrombin Time: 13.4 seconds (ref 11.4–15.2)

## 2017-11-01 LAB — CBC
HCT: 38.1 % — ABNORMAL LOW (ref 39.0–52.0)
Hemoglobin: 12.8 g/dL — ABNORMAL LOW (ref 13.0–17.0)
MCH: 30.5 pg (ref 26.0–34.0)
MCHC: 33.6 g/dL (ref 30.0–36.0)
MCV: 90.9 fL (ref 78.0–100.0)
PLATELETS: 193 10*3/uL (ref 150–400)
RBC: 4.19 MIL/uL — ABNORMAL LOW (ref 4.22–5.81)
RDW: 13.4 % (ref 11.5–15.5)
WBC: 8.4 10*3/uL (ref 4.0–10.5)

## 2017-11-01 LAB — GLUCOSE, CAPILLARY: GLUCOSE-CAPILLARY: 232 mg/dL — AB (ref 65–99)

## 2017-11-01 MED ORDER — HYDRALAZINE HCL 20 MG/ML IJ SOLN
INTRAMUSCULAR | Status: AC
  Filled 2017-11-01: qty 1

## 2017-11-01 MED ORDER — HYDRALAZINE HCL 20 MG/ML IJ SOLN
10.0000 mg | Freq: Once | INTRAMUSCULAR | Status: AC
  Administered 2017-11-01: 10 mg via INTRAVENOUS

## 2017-11-01 MED ORDER — LOSARTAN POTASSIUM 50 MG PO TABS
50.0000 mg | ORAL_TABLET | Freq: Once | ORAL | Status: AC
  Administered 2017-11-01: 50 mg via ORAL
  Filled 2017-11-01: qty 1

## 2017-11-01 MED ORDER — CLONIDINE HCL 0.1 MG PO TABS
0.1000 mg | ORAL_TABLET | Freq: Once | ORAL | Status: AC
  Administered 2017-11-01: 0.1 mg via ORAL
  Filled 2017-11-01: qty 1

## 2017-11-01 MED ORDER — LIDOCAINE HCL (PF) 1 % IJ SOLN
INTRAMUSCULAR | Status: AC
  Filled 2017-11-01: qty 30

## 2017-11-01 MED ORDER — SODIUM CHLORIDE 0.9 % IV SOLN: INTRAVENOUS | Status: DC

## 2017-11-01 MED ORDER — AMLODIPINE BESYLATE 10 MG PO TABS
10.0000 mg | ORAL_TABLET | Freq: Once | ORAL | Status: AC
  Administered 2017-11-01: 10 mg via ORAL
  Filled 2017-11-01: qty 1

## 2017-11-01 NOTE — Progress Notes (Signed)
Patient has now received 40mg  total of IV hydralazine.    SBP remains 202.    Will defer biopsy for now, and can attempt again once SBP is better controlled.   Discussed with patient.  He understands.   Signed,  Dulcy Fanny. Earleen Newport, DO

## 2017-11-01 NOTE — H&P (Signed)
Chief Complaint: Patient was seen in consultation today for random renal biopsy at the request of Coladonato,Joseph  Referring Physician(s): Coladonato,Joseph  Supervising Physician: Corrie Mckusick  Patient Status: [facility] - Out-pt  History of Present Illness: Gerald Day is a 62 y.o. male   Proteinuria DM; long term HTN AKI CKD stage G2/A2 Scheduled for random renal biopsy   Past Medical History:  Diagnosis Date  . Arthritis    IN KNEES  . Cellulitis 10/26/2013   LOWER RT EXTREMITY  . CELLULITIS/ABSCESS, LEG 06/27/2007  . COLONIC POLYPS   . CONGESTIVE HEART FAILURE   . DIABETES MELLITUS, TYPE II   . DIVERTICULOSIS, COLON   . ERECTILE DYSFUNCTION, ORGANIC   . GOUT NOS   . Headache   . HYPERLIPIDEMIA   . HYPERTENSION   . LOW BACK PAIN   . Morbid obesity ([place])   . SLEEP APNEA, OBSTRUCTIVE    USES CPAP    Past Surgical History:  Procedure Laterality Date  . APPENDECTOMY    . FOOT SURGERY     LT  . TENOTOMY / FLEXOR TENDON TRANSFER Right 03/15/2015   Procedure: TENOTOMY HT REPAIR/ULCER DEBRIDEMENT/GRAFT PREP/ACELL GRAFT APPLICATION;  Surgeon: Jana Half, DPM;  Location: [place];  Service: Podiatry;  Laterality: Right;  HALLUX    Allergies: Penicillins  Medications: Prior to Admission medications   Medication Sig Start Date End Date Taking? Authorizing Provider  amLODipine (NORVASC) 10 MG tablet TAKE 1 TABLET (10 MG TOTAL) BY MOUTH DAILY. 05/17/17  Yes Biagio Borg, MD  atorvastatin (LIPITOR) 10 MG tablet TAKE 1 TABLET (10 MG TOTAL) BY MOUTH DAILY. 04/14/17  Yes Biagio Borg, MD  cloNIDine (CATAPRES) 0.1 MG tablet Take 0.1 mg by mouth daily. 09/10/17  Yes [provider]  diclofenac sodium (VOLTAREN) 1 % GEL Apply 2 g topically 4 (four) times daily. Patient taking differently: Apply 2 g topically 3 (three) times daily.  10/11/17  Yes Leandrew Koyanagi, MD  furosemide (LASIX) 80 MG tablet Take 1 tablet (80 mg total) by mouth daily as needed. Patient  taking differently: Take 80 mg by mouth 2 (two) times daily.  06/02/17  Yes Biagio Borg, MD  glipiZIDE (GLUCOTROL XL) 5 MG 24 hr tablet TAKE 1 TABLET BY MOUTH EVERY DAY WITH BREAKFAST 11/23/16  Yes Biagio Borg, MD  KLOR-CON M20 20 MEQ tablet TAKE 1 TABLET (20 MEQ TOTAL) BY MOUTH DAILY. 11/30/16  Yes Biagio Borg, MD  losartan (COZAAR) 50 MG tablet Take 2 tablets (100 mg total) by mouth daily. Patient taking differently: Take 50 mg by mouth daily.  05/19/17 05/19/18 Yes Biagio Borg, MD  metFORMIN (GLUCOPHAGE-XR) 500 MG 24 hr tablet TAKE 4 TABLETS BY MOUTH PER DAY Patient taking differently: TAKE 4 TABLETS BY MOUTH DAILY IN THE MORNING. 05/21/17  Yes Biagio Borg, MD  cyclobenzaprine (FLEXERIL) 10 MG tablet Take 1 tablet (10 mg total) by mouth 3 (three) times daily as needed for muscle spasms. 06/18/17   Biagio Borg, MD  HYDROcodone-acetaminophen (NORCO) 7.5-325 MG tablet Take 1 tablet by mouth every 6 (six) hours as needed for moderate pain. 06/02/17   Biagio Borg, MD  meloxicam (MOBIC) 15 MG tablet Take 1 tablet (15 mg total) by mouth daily. 06/07/17   Rosemarie Ax, MD  traMADol (ULTRAM) 50 MG tablet Take 1 tablet (50 mg total) by mouth every 8 (eight) hours as needed. 05/14/17   Biagio Borg, MD  Family History  Problem Relation Age of Onset  . Cardiomyopathy Father   . Asthma Other     Social History   Socioeconomic History  . Marital status: Married    Spouse name: None  . Number of children: None  . Years of education: None  . Highest education level: None  Social Needs  . Financial resource strain: None  . Food insecurity - worry: None  . Food insecurity - inability: None  . Transportation needs - medical: None  . Transportation needs - non-medical: None  Occupational History  . None  Tobacco Use  . Smoking status: Never Smoker  . Smokeless tobacco: Never Used  Substance and Sexual Activity  . Alcohol use: Yes    Alcohol/week: 1.2 oz    Types: 2 Cans of beer  per week    Comment: 6 beers a week  . Drug use: No  . Sexual activity: None  Other Topics Concern  . None  Social History Narrative  . None    Review of Systems: A 12 point ROS discussed and pertinent positives are indicated in the HPI above.  All other systems are negative.  Review of Systems  Constitutional: Negative for activity change, appetite change, fatigue and fever.  Respiratory: Negative for cough and shortness of breath.   Cardiovascular: Negative for chest pain.  Gastrointestinal: Negative for abdominal pain.  Neurological: Negative for weakness.  Psychiatric/Behavioral: Negative for behavioral problems and confusion.    Vital Signs: BP (!) 213/97   Pulse 62   Temp 98.1 F (36.7 C) (Oral)   Resp 20   Ht 6' (1.829 m)   Wt (!) 400 lb (181.4 kg)   SpO2 100%   BMI 54.25 kg/m   Physical Exam  Constitutional: He is oriented to person, place, and time.  Cardiovascular: Normal rate, regular rhythm and normal heart sounds.  Pulmonary/Chest: Effort normal and breath sounds normal.  Abdominal: Soft. Bowel sounds are normal.  Musculoskeletal: Normal range of motion.  Neurological: He is alert and oriented to person, place, and time.  Skin: Skin is warm and dry.  Psychiatric: He has a normal mood and affect. His behavior is normal. Judgment and thought content normal.  Nursing note and vitals reviewed.   Imaging: Xr C-arm No Report  Result Date: 10/27/2017 Please see Notes or Procedures tab for imaging impression.  Xr Knee 3 View Left  Result Date: 10/11/2017 Tricompartmental degenerative joint disease  Xr Knee 3 View Right  Result Date: 10/11/2017 Tricompartmental degenerative joint disease     Labs:  CBC: Recent Labs    11/11/16 0725  WBC 8.7  HGB 12.9*  HCT 37.6*  PLT 254.0    COAGS: No results for input(s): INR, APTT in the last 8760 hours.  BMP: Recent Labs    11/11/16 0725 05/28/17 0726 06/02/17 1623 06/11/17 1057  NA 140 138 138  137  K 4.8 4.5 4.3 4.7  CL 105 103 103 100  CO2 30 25 28 29   GLUCOSE 212* 166* 169* 128*  BUN 16 31* 15 23  CALCIUM 9.0 9.6 9.6 9.5  CREATININE 0.98 1.57* 1.04 1.16    LIVER FUNCTION TESTS: Recent Labs    11/11/16 0725 05/28/17 0726  BILITOT 0.6 0.8  AST 13 14  ALT 21 21  ALKPHOS 77 56  PROT 6.7 7.2  ALBUMIN 3.5 3.7    TUMOR MARKERS: No results for input(s): AFPTM, CEA, CA199, CHROMGRNA in the last 8760 hours.  Assessment and Plan:  Proteinuria  CKD HTN Scheduled for random renal bx 213/97 this am Will make Dr Earleen Newport aware Risks and benefits discussed with the patient including, but not limited to bleeding, infection, damage to adjacent structures or low yield requiring additional tests.  All of the patient's questions were answered, patient is agreeable to proceed. Consent signed and in chart.   Thank you for this interesting consult.  I greatly enjoyed meeting LEONELL LOBDELL and look forward to participating in their care.  A copy of this report was sent to the requesting provider on this date.  Electronically Signed: Lavonia Drafts, PA-C 11/01/2017, 7:32 AM   I spent a total of  30 Minutes   in face to face in clinical consultation, greater than 50% of which was counseling/coordinating care for random renal bx

## 2017-11-01 NOTE — Progress Notes (Signed)
Renal biopsy cancelled due blood pressure. D/C home with wife. Awake and alert. In no distress.

## 2017-11-04 MED ORDER — TRIAMCINOLONE ACETONIDE 40 MG/ML IJ SUSP
60.0000 mg | INTRAMUSCULAR | Status: AC | PRN
  Administered 2017-10-27: 60 mg via INTRA_ARTICULAR

## 2017-11-04 MED ORDER — BUPIVACAINE HCL 0.5 % IJ SOLN
3.0000 mL | INTRAMUSCULAR | Status: AC | PRN
  Administered 2017-10-27: 3 mL via INTRA_ARTICULAR

## 2017-11-15 ENCOUNTER — Other Ambulatory Visit: Payer: Self-pay | Admitting: Internal Medicine

## 2017-11-16 ENCOUNTER — Other Ambulatory Visit: Payer: Self-pay | Admitting: Internal Medicine

## 2017-11-23 ENCOUNTER — Telehealth (INDEPENDENT_AMBULATORY_CARE_PROVIDER_SITE_OTHER): Payer: Self-pay

## 2017-11-23 ENCOUNTER — Ambulatory Visit (INDEPENDENT_AMBULATORY_CARE_PROVIDER_SITE_OTHER): Payer: PRIVATE HEALTH INSURANCE | Admitting: Orthopaedic Surgery

## 2017-11-23 NOTE — Telephone Encounter (Signed)
Patient is scheduled for an appt with Dr Erlinda Hong today. We will need to R/S his appt. He needs to get BIL KNEE (MONOVISC) inj's with Dr Ernestina Patches instead  With C-arm. Tried to call patient multiple times diff numbers no answer.

## 2017-11-29 ENCOUNTER — Other Ambulatory Visit: Payer: Self-pay | Admitting: Internal Medicine

## 2017-12-02 ENCOUNTER — Ambulatory Visit (INDEPENDENT_AMBULATORY_CARE_PROVIDER_SITE_OTHER): Payer: PRIVATE HEALTH INSURANCE

## 2017-12-02 ENCOUNTER — Encounter (INDEPENDENT_AMBULATORY_CARE_PROVIDER_SITE_OTHER): Payer: Self-pay | Admitting: Physical Medicine and Rehabilitation

## 2017-12-02 ENCOUNTER — Ambulatory Visit: Payer: BLUE CROSS/BLUE SHIELD | Admitting: Internal Medicine

## 2017-12-02 ENCOUNTER — Ambulatory Visit (INDEPENDENT_AMBULATORY_CARE_PROVIDER_SITE_OTHER): Payer: PRIVATE HEALTH INSURANCE | Admitting: Physical Medicine and Rehabilitation

## 2017-12-02 DIAGNOSIS — M25561 Pain in right knee: Secondary | ICD-10-CM | POA: Diagnosis not present

## 2017-12-02 DIAGNOSIS — G8929 Other chronic pain: Secondary | ICD-10-CM | POA: Diagnosis not present

## 2017-12-02 DIAGNOSIS — M170 Bilateral primary osteoarthritis of knee: Secondary | ICD-10-CM

## 2017-12-02 DIAGNOSIS — M25562 Pain in left knee: Principal | ICD-10-CM

## 2017-12-02 NOTE — Patient Instructions (Signed)

## 2017-12-02 NOTE — Progress Notes (Signed)
ZEPLIN ALESHIRE - 62 y.o. male MRN [id number]  Date of birth: 01-25-1956  Office Visit Note: Visit Date: 12/02/2017 PCP: Biagio Borg, MD Referred by: Biagio Borg, MD  Subjective: Chief Complaint  Patient presents with  . Right Knee - Pain  . Left Knee - Pain   HPI: Mr. Negro is a 62 year old morbidly obese gentleman who is followed by Dr. Erlinda Hong in our office for his orthopedic conditions.  He has bilateral knee arthritis and bilateral knee pain.  I have seen in the past for knee injection with fluoroscopic guidance dated his body habitus.  He has been approved for Monovisc injections of both knees.  We are to do that today with fluoroscopic guidance.  He will follow-up with Dr. Erlinda Hong.   ROS Otherwise per HPI.  Assessment & Plan: Visit Diagnoses:  1. Chronic pain of left knee   2. Chronic pain of right knee   3. Primary osteoarthritis of both knees     Plan: No additional findings.   Meds & Orders: No orders of the defined types were placed in this encounter.   Orders Placed This Encounter  Procedures  . Large Joint Inj: R knee  . Large Joint Inj: L knee  . XR C-ARM NO REPORT    Follow-up: Return if symptoms worsen or fail to improve, for Dr. Erlinda Hong.   Procedures: Large Joint Inj: R knee on 12/02/2017 1:37 PM Indications: pain Details: 22 G Needle length (in): 5 in. needle, fluoroscopy-guided anterior approach  Arthrogram: No  Medications: 88 mg Hyaluronan 88 MG/4ML Outcome: tolerated well, no immediate complications Consent was given by the patient. Immediately prior to procedure a time out was called to verify the correct patient, procedure, equipment, support staff and site/side marked as required. Patient was prepped and draped in the usual sterile fashion.   Large Joint Inj: L knee on 12/02/2017 1:40 PM Indications: pain Details: 22 G Needle length (in): 5 in. needle, fluoroscopy-guided anterior approach  Arthrogram: No  Medications: 88 mg Hyaluronan 88  MG/4ML Outcome: tolerated well, no immediate complications Immediately prior to procedure a time out was called to verify the correct patient, procedure, equipment, support staff and site/side marked as required. Patient was prepped and draped in the usual sterile fashion.      No notes on file   Clinical History: MRI LUMBAR SPINE WITHOUT CONTRAST  TECHNIQUE: Multiplanar, multisequence MR imaging of the lumbar spine was performed. No intravenous contrast was administered.  COMPARISON:  None.  FINDINGS: Segmentation:  Standard.  Alignment:  Physiologic.  Vertebrae:  No fracture, evidence of discitis, or bone lesion.  Conus medullaris: Extends to the L1 level and appears normal.  Paraspinal and other soft tissues: No paraspinal abnormality.  Disc levels:  Disc spaces: Disc desiccation at L3-4, L4-5 and L5-S1.  T12-L1: No significant disc bulge. No evidence of neural foraminal stenosis. No central canal stenosis.  L1-L2: No significant disc bulge. No evidence of neural foraminal stenosis. No central canal stenosis.  L2-L3: No significant disc bulge. Mild left facet arthropathy. No evidence of neural foraminal stenosis. No central canal stenosis.  L3-L4: Central disc protrusion impressing on the thecal sac. No evidence of neural foraminal stenosis. No central canal stenosis.  L4-L5: Mild broad-based disc bulge. Right facet arthropathy with a small effusion. No evidence of neural foraminal stenosis. No central canal stenosis.  L5-S1: Mild broad-based disc bulge. Mild left facet arthropathy with mild left foraminal stenosis. No central canal stenosis.  IMPRESSION: 1. At  L3-4 there is a central disc protrusion impressing on the thecal sac. 2. At L5-S1 there is a mild broad-based disc bulge. Mild left facet arthropathy with mild left foraminal stenosis.   Electronically Signed   By: Kathreen Devoid   On: 06/26/2017 09:37   He reports that he has  never smoked. He has never used smokeless tobacco.  Recent Labs    05/28/17 0726  HGBA1C 6.3    Objective:  VS:  HT:    WT:   BMI:     BP:   HR: bpm  TEMP: ( )  RESP:  Physical Exam  Ortho Exam Imaging: No results found.  Past Medical/Family/Surgical/Social History: Medications & Allergies reviewed per EMR, new medications updated. Patient Active Problem List   Diagnosis Date Noted  . Constipation 06/18/2017  . AKI (acute kidney injury) ([place]) 06/02/2017  . Low back pain 05/14/2017  . Proteinuria 11/22/2016  . Skin lesion 11/12/2015  . Preop exam for internal medicine 01/16/2015  . Cellulitis and abscess of leg 10/26/2013  . DM foot ulcer ([place]) 10/23/2013  . Preventative health care 07/05/2011  . History of colonic polyps 09/05/2009  . COLONIC POLYPS 03/14/2008  . DIVERTICULOSIS, COLON 03/14/2008  . Hyperlipidemia 06/27/2007  . CELLULITIS/ABSCESS, LEG 06/27/2007  . Diabetes ([place]) 05/21/2007  . GOUT NOS 05/21/2007  . Morbid obesity ([place]) 05/21/2007  . ERECTILE DYSFUNCTION 05/21/2007  . OSA (obstructive sleep apnea) 05/21/2007  . Essential hypertension 05/21/2007   Past Medical History:  Diagnosis Date  . Arthritis    IN KNEES  . Cellulitis 10/26/2013   LOWER RT EXTREMITY  . CELLULITIS/ABSCESS, LEG 06/27/2007  . COLONIC POLYPS   . CONGESTIVE HEART FAILURE   . DIABETES MELLITUS, TYPE II   . DIVERTICULOSIS, COLON   . ERECTILE DYSFUNCTION, ORGANIC   . GOUT NOS   . Headache   . HYPERLIPIDEMIA   . HYPERTENSION   . LOW BACK PAIN   . Morbid obesity ([place])   . SLEEP APNEA, OBSTRUCTIVE    USES CPAP   Family History  Problem Relation Age of Onset  . Cardiomyopathy Father   . Asthma Other    Past Surgical History:  Procedure Laterality Date  . APPENDECTOMY    . FOOT SURGERY     LT  . TENOTOMY / FLEXOR TENDON TRANSFER Right 03/15/2015   Procedure: TENOTOMY HT REPAIR/ULCER DEBRIDEMENT/GRAFT PREP/ACELL GRAFT APPLICATION;  Surgeon: Jana Half, DPM;   Location: [place];  Service: Podiatry;  Laterality: Right;  HALLUX   Social History   Occupational History  . Not on file  Tobacco Use  . Smoking status: Never Smoker  . Smokeless tobacco: Never Used  Substance and Sexual Activity  . Alcohol use: Yes    Alcohol/week: 1.2 oz    Types: 2 Cans of beer per week    Comment: 6 beers a week  . Drug use: No  . Sexual activity: Not on file

## 2017-12-02 NOTE — Progress Notes (Signed)
 .  Numeric Pain Rating Scale and Functional Assessment Average Pain 5   In the last MONTH (on 0-10 scale) has pain interfered with the following?  1. General activity like being  able to carry out your everyday physical activities such as walking, climbing stairs, carrying groceries, or moving a chair?  Rating(2)   +Driver, -BT, -Dye Allergies.   

## 2017-12-10 MED ORDER — HYALURONAN 88 MG/4ML IX SOSY
88.0000 mg | PREFILLED_SYRINGE | INTRA_ARTICULAR | Status: AC | PRN
  Administered 2017-12-02: 88 mg via INTRA_ARTICULAR

## 2017-12-13 ENCOUNTER — Other Ambulatory Visit (INDEPENDENT_AMBULATORY_CARE_PROVIDER_SITE_OTHER): Payer: PRIVATE HEALTH INSURANCE

## 2017-12-13 DIAGNOSIS — E119 Type 2 diabetes mellitus without complications: Secondary | ICD-10-CM | POA: Diagnosis not present

## 2017-12-13 DIAGNOSIS — Z0000 Encounter for general adult medical examination without abnormal findings: Secondary | ICD-10-CM

## 2017-12-13 LAB — LIPID PANEL
CHOLESTEROL: 182 mg/dL (ref 0–200)
HDL: 55.4 mg/dL (ref >39.00)
LDL Cholesterol: 99 mg/dL (ref 0–99)
NonHDL: 127.03
Total CHOL/HDL Ratio: 3
Triglycerides: 142 mg/dL (ref 0.0–149.0)
VLDL: 28.4 mg/dL (ref 0.0–40.0)

## 2017-12-13 LAB — BASIC METABOLIC PANEL
BUN: 26 mg/dL — ABNORMAL HIGH (ref 6–23)
CO2: 29 mEq/L (ref 19–32)
Calcium: 9.3 mg/dL (ref 8.4–10.5)
Chloride: 104 mEq/L (ref 96–112)
Creatinine, Ser: 1.09 mg/dL (ref 0.40–1.50)
GFR: 72.62 mL/min (ref >60.00)
GLUCOSE: 208 mg/dL — AB (ref 70–99)
POTASSIUM: 5 meq/L (ref 3.5–5.1)
SODIUM: 140 meq/L (ref 135–145)

## 2017-12-13 LAB — CBC WITH DIFFERENTIAL/PLATELET
BASOS PCT: 1.1 % (ref 0.0–3.0)
Basophils Absolute: 0.1 10*3/uL (ref 0.0–0.1)
Eosinophils Absolute: 0.3 10*3/uL (ref 0.0–0.7)
Eosinophils Relative: 3.9 % (ref 0.0–5.0)
HCT: 38.3 % — ABNORMAL LOW (ref 39.0–52.0)
HEMOGLOBIN: 13.4 g/dL (ref 13.0–17.0)
Lymphocytes Relative: 12.4 % (ref 12.0–46.0)
Lymphs Abs: 1 10*3/uL (ref 0.7–4.0)
MCHC: 35.1 g/dL (ref 30.0–36.0)
MCV: 90.1 fl (ref 78.0–100.0)
MONO ABS: 0.7 10*3/uL (ref 0.1–1.0)
Monocytes Relative: 9 % (ref 3.0–12.0)
NEUTROS ABS: 5.8 10*3/uL (ref 1.4–7.7)
Neutrophils Relative %: 73.6 % (ref 43.0–77.0)
Platelets: 204 10*3/uL (ref 150.0–400.0)
RBC: 4.25 Mil/uL (ref 4.22–5.81)
RDW: 13.6 % (ref 11.5–15.5)
WBC: 7.9 10*3/uL (ref 4.0–10.5)

## 2017-12-13 LAB — URINALYSIS, ROUTINE W REFLEX MICROSCOPIC
Bilirubin Urine: NEGATIVE
KETONES UR: NEGATIVE
Leukocytes, UA: NEGATIVE
NITRITE: NEGATIVE
PH: 5.5 (ref 5.0–8.0)
SPECIFIC GRAVITY, URINE: 1.025 (ref 1.000–1.030)
UROBILINOGEN UA: 0.2 (ref 0.0–1.0)
Urine Glucose: NEGATIVE

## 2017-12-13 LAB — MICROALBUMIN / CREATININE URINE RATIO
Creatinine,U: 87.7 mg/dL
MICROALB/CREAT RATIO: 404.7 mg/g — AB (ref 0.0–30.0)
Microalb, Ur: 355 mg/dL — ABNORMAL HIGH (ref 0.0–1.9)

## 2017-12-13 LAB — PSA: PSA: 0.48 ng/mL (ref 0.10–4.00)

## 2017-12-13 LAB — HEPATIC FUNCTION PANEL
ALT: 17 U/L (ref 0–53)
AST: 13 U/L (ref 0–37)
Albumin: 3.7 g/dL (ref 3.5–5.2)
Alkaline Phosphatase: 73 U/L (ref 39–117)
BILIRUBIN TOTAL: 0.5 mg/dL (ref 0.2–1.2)
Bilirubin, Direct: 0.1 mg/dL (ref 0.0–0.3)
Total Protein: 6.7 g/dL (ref 6.0–8.3)

## 2017-12-13 LAB — TSH: TSH: 3.94 u[IU]/mL (ref 0.35–4.50)

## 2017-12-13 LAB — HEMOGLOBIN A1C: Hgb A1c MFr Bld: 7.2 % — ABNORMAL HIGH (ref 4.6–6.5)

## 2017-12-14 ENCOUNTER — Ambulatory Visit: Payer: PRIVATE HEALTH INSURANCE | Admitting: Internal Medicine

## 2017-12-17 ENCOUNTER — Ambulatory Visit (INDEPENDENT_AMBULATORY_CARE_PROVIDER_SITE_OTHER): Payer: PRIVATE HEALTH INSURANCE | Admitting: Internal Medicine

## 2017-12-17 ENCOUNTER — Encounter: Payer: Self-pay | Admitting: Internal Medicine

## 2017-12-17 VITALS — BP 132/84 | HR 64 | Temp 98.3°F | Ht 72.0 in | Wt >= 6400 oz

## 2017-12-17 DIAGNOSIS — E119 Type 2 diabetes mellitus without complications: Secondary | ICD-10-CM

## 2017-12-17 DIAGNOSIS — I10 Essential (primary) hypertension: Secondary | ICD-10-CM

## 2017-12-17 DIAGNOSIS — Z0000 Encounter for general adult medical examination without abnormal findings: Secondary | ICD-10-CM

## 2017-12-17 DIAGNOSIS — Z1159 Encounter for screening for other viral diseases: Secondary | ICD-10-CM | POA: Diagnosis not present

## 2017-12-17 DIAGNOSIS — Z114 Encounter for screening for human immunodeficiency virus [HIV]: Secondary | ICD-10-CM | POA: Diagnosis not present

## 2017-12-17 MED ORDER — SILDENAFIL CITRATE 100 MG PO TABS: 50.0000 mg | ORAL_TABLET | Freq: Every day | ORAL | 11 refills | Status: DC | PRN

## 2017-12-17 NOTE — Assessment & Plan Note (Signed)

## 2017-12-17 NOTE — Progress Notes (Signed)
Subjective:    Patient ID: Gerald Day, male    DOB: 1956/04/24, 62 y.o.   MRN: [id number]  HPI  Here for wellness and f/u;  Overall doing ok;  Pt denies Chest pain, worsening SOB, DOE, wheezing, orthopnea, PND, worsening LE edema, palpitations, dizziness or syncope.  Pt denies neurological change such as new headache, facial or extremity weakness.  Pt denies polydipsia, polyuria, or low sugar symptoms. Pt states overall good compliance with treatment and medications, good tolerability, and has been trying to follow appropriate diet.  Pt denies worsening depressive symptoms, suicidal ideation or panic. No fever, night sweats, wt loss, loss of appetite, or other constitutional symptoms.  Pt states good ability with ADL's, has low fall risk, home safety reviewed and adequate, no other significant changes in hearing or vision, and not active with exercise. Has been working on elev BP per renal, with plan for renal biopsy soon. No other interval hx or new complaint Past Medical History:  Diagnosis Date  . Arthritis    IN KNEES  . Cellulitis 10/26/2013   LOWER RT EXTREMITY  . CELLULITIS/ABSCESS, LEG 06/27/2007  . COLONIC POLYPS   . CONGESTIVE HEART FAILURE   . DIABETES MELLITUS, TYPE II   . DIVERTICULOSIS, COLON   . ERECTILE DYSFUNCTION, ORGANIC   . GOUT NOS   . Headache   . HYPERLIPIDEMIA   . HYPERTENSION   . LOW BACK PAIN   . Morbid obesity ([place])   . SLEEP APNEA, OBSTRUCTIVE    USES CPAP   Past Surgical History:  Procedure Laterality Date  . APPENDECTOMY    . FOOT SURGERY     LT  . TENOTOMY / FLEXOR TENDON TRANSFER Right 03/15/2015   Procedure: TENOTOMY HT REPAIR/ULCER DEBRIDEMENT/GRAFT PREP/ACELL GRAFT APPLICATION;  Surgeon: Jana Half, DPM;  Location: [facility];  Service: Podiatry;  Laterality: Right;  HALLUX    reports that he has never smoked. He has never used smokeless tobacco. He reports that he drinks about 1.2 oz of alcohol per week. He reports that he does not use  drugs. family history includes Asthma in his other; Cardiomyopathy in his father. Allergies  Allergen Reactions  . Penicillins Other (See Comments)    Unknown childhood allergic reaction Has patient had a PCN reaction causing immediate rash, facial/tongue/throat swelling, SOB or lightheadedness with hypotension: Unknown Has patient had a PCN reaction causing severe rash involving mucus membranes or skin necrosis: Unknown Has patient had a PCN reaction that required hospitalization: No Has patient had a PCN reaction occurring within the last 10 years: No If all of the above answers are "NO", then may proceed with Cephalosporin use.    Current Outpatient Medications on File Prior to Visit  Medication Sig Dispense Refill  . amLODipine (NORVASC) 10 MG tablet TAKE 1 TABLET (10 MG TOTAL) BY MOUTH DAILY. 90 tablet 1  . atorvastatin (LIPITOR) 10 MG tablet TAKE 1 TABLET (10 MG TOTAL) BY MOUTH DAILY. 90 tablet 1  . cloNIDine (CATAPRES) 0.3 MG tablet Take 0.3 mg by mouth 3 (three) times daily.    . cyclobenzaprine (FLEXERIL) 10 MG tablet Take 1 tablet (10 mg total) by mouth 3 (three) times daily as needed for muscle spasms. 50 tablet 0  . diclofenac sodium (VOLTAREN) 1 % GEL Apply 2 g topically 4 (four) times daily. (Patient taking differently: Apply 2 g topically 3 (three) times daily. ) 1 Tube 5  . furosemide (LASIX) 80 MG tablet Take 1 tablet (80 mg total) by  mouth daily as needed. (Patient taking differently: Take 80 mg by mouth 2 (two) times daily. ) 90 tablet 3  . glipiZIDE (GLUCOTROL XL) 5 MG 24 hr tablet TAKE 1 TABLET BY MOUTH EVERY DAY WITH BREAKFAST 90 tablet 2  . HYDROcodone-acetaminophen (NORCO) 7.5-325 MG tablet Take 1 tablet by mouth every 6 (six) hours as needed for moderate pain. 30 tablet 0  . KLOR-CON M20 20 MEQ tablet TAKE 1 TABLET (20 MEQ TOTAL) BY MOUTH DAILY. 30 tablet 0  . losartan (COZAAR) 50 MG tablet Take 2 tablets (100 mg total) by mouth daily. (Patient taking differently:  Take 50 mg by mouth daily. ) 180 tablet 3  . meloxicam (MOBIC) 15 MG tablet Take 1 tablet (15 mg total) by mouth daily. 30 tablet 1  . metFORMIN (GLUCOPHAGE-XR) 500 MG 24 hr tablet TAKE 4 TABLETS BY MOUTH PER DAY (Patient taking differently: TAKE 4 TABLETS BY MOUTH DAILY IN THE MORNING.) 360 tablet 2  . traMADol (ULTRAM) 50 MG tablet Take 1 tablet (50 mg total) by mouth every 8 (eight) hours as needed. 60 tablet 0   No current facility-administered medications on file prior to visit.    Review of Systems Constitutional: Negative for other unusual diaphoresis, sweats, appetite or weight changes HENT: Negative for other worsening hearing loss, ear pain, facial swelling, mouth sores or neck stiffness.   Eyes: Negative for other worsening pain, redness or other visual disturbance.  Respiratory: Negative for other stridor or swelling Cardiovascular: Negative for other palpitations or other chest pain  Gastrointestinal: Negative for worsening diarrhea or loose stools, blood in stool, distention or other pain Genitourinary: Negative for hematuria, flank pain or other change in urine volume.  Musculoskeletal: Negative for myalgias or other joint swelling.  Skin: Negative for other color change, or other wound or worsening drainage.  Neurological: Negative for other syncope or numbness. Hematological: Negative for other adenopathy or swelling Psychiatric/Behavioral: Negative for hallucinations, other worsening agitation, SI, self-injury, or new decreased concentration All other system neg per pt    Objective:   Physical Exam BP 132/84   Pulse 64   Temp 98.3 F (36.8 C) (Oral)   Ht 6' (1.829 m)   Wt (!) 406 lb (184.2 kg)   SpO2 96%   BMI 55.06 kg/m  VS noted,  Constitutional: Pt is oriented to person, place, and time. Appears well-developed and well-nourished, in no significant distress and comfortable Head: Normocephalic and atraumatic  Eyes: Conjunctivae and EOM are normal. Pupils are  equal, round, and reactive to light Right Ear: External ear normal without discharge Left Ear: External ear normal without discharge Nose: Nose without discharge or deformity Mouth/Throat: Oropharynx is without other ulcerations and moist  Neck: Normal range of motion. Neck supple. No JVD present. No tracheal deviation present or significant neck LA or mass Cardiovascular: Normal rate, regular rhythm, normal heart sounds and intact distal pulses.   Pulmonary/Chest: WOB normal and breath sounds without rales or wheezing  Abdominal: Soft. Bowel sounds are normal. NT. No HSM  Musculoskeletal: Normal range of motion. Exhibits no edema Lymphadenopathy: Has no other cervical adenopathy.  Neurological: Pt is alert and oriented to person, place, and time. Pt has normal reflexes. No cranial nerve deficit. Motor grossly intact, Gait intact Skin: Skin is warm and dry. No rash noted or new ulcerations Psychiatric:  Has normal mood and affect. Behavior is normal without agitation No other exam findings Lab Results  Component Value Date   WBC 7.9 12/13/2017   HGB  13.4 12/13/2017   HCT 38.3 (L) 12/13/2017   PLT 204.0 12/13/2017   GLUCOSE 208 (H) 12/13/2017   CHOL 182 12/13/2017   TRIG 142.0 12/13/2017   HDL 55.40 12/13/2017   LDLDIRECT 107.6 05/08/2014   LDLCALC 99 12/13/2017   ALT 17 12/13/2017   AST 13 12/13/2017   NA 140 12/13/2017   K 5.0 12/13/2017   CL 104 12/13/2017   CREATININE 1.09 12/13/2017   BUN 26 (H) 12/13/2017   CO2 29 12/13/2017   TSH 3.94 12/13/2017   PSA 0.48 12/13/2017   INR 1.03 11/01/2017   HGBA1C 7.2 (H) 12/13/2017   MICROALBUR 355.0 (H) 12/13/2017      Assessment & Plan:

## 2017-12-17 NOTE — Patient Instructions (Signed)
Please continue all other medications as before, and refills have been done if requested.  Please have the pharmacy call with any other refills you may need.  Please continue your efforts at being more active, low cholesterol diet, and weight control.  You are otherwise up to date with prevention measures today.  Please keep your appointments with your specialists as you may have planned  You will be contacted regarding the referral for: colonoscopy  Please return in 6 months, or sooner if needed, with Lab testing done 3-5 days before  

## 2017-12-17 NOTE — Assessment & Plan Note (Signed)
stable overall by history and exam, recent data reviewed with pt, and pt to continue medical treatment as before,  to f/u any worsening symptoms or concerns BP Readings from Last 3 Encounters:  12/17/17 132/84  11/01/17 (!) 195/78  07/09/17 (!) 144/94

## 2017-12-17 NOTE — Assessment & Plan Note (Signed)
stable overall by history and exam, recent data reviewed with pt, and pt to continue medical treatment as before,  to f/u any worsening symptoms or concerns Lab Results  Component Value Date   HGBA1C 7.2 (H) 12/13/2017

## 2017-12-24 ENCOUNTER — Other Ambulatory Visit: Payer: Self-pay | Admitting: Internal Medicine

## 2018-01-17 ENCOUNTER — Encounter (INDEPENDENT_AMBULATORY_CARE_PROVIDER_SITE_OTHER): Payer: Self-pay | Admitting: Orthopaedic Surgery

## 2018-01-17 ENCOUNTER — Ambulatory Visit (INDEPENDENT_AMBULATORY_CARE_PROVIDER_SITE_OTHER): Payer: PRIVATE HEALTH INSURANCE | Admitting: Orthopaedic Surgery

## 2018-01-17 DIAGNOSIS — M5416 Radiculopathy, lumbar region: Secondary | ICD-10-CM | POA: Insufficient documentation

## 2018-01-17 MED ORDER — METHYLPREDNISOLONE 4 MG PO TBPK: ORAL_TABLET | ORAL | 0 refills | Status: DC

## 2018-01-17 NOTE — Progress Notes (Signed)
Office Visit Note   Patient: Gerald Day           Date of Birth: June 11, 1956           MRN: [id number] Visit Date: 01/17/2018              Requested by: Biagio Borg, MD [address], [place] [id number] PCP: Biagio Borg, MD   Assessment & Plan: Visit Diagnoses:  1. Radiculopathy, lumbar region     Plan: Impression is left sided lumbar radiculopathy.  I will go ahead and call in a methylprednisolone taper as the patient is diabetic.  We will also set him up for an epidural steroid injection.  He will follow-up with [place] on an as-needed basis.  Call with concerns or questions in the meantime.  Follow-Up Instructions: Return if symptoms worsen or fail to improve.   Orders:  No orders of the defined types were placed in this encounter.  Meds ordered this encounter  Medications  . methylPREDNISolone (MEDROL DOSEPAK) 4 MG TBPK tablet    Sig: Take as directed    Dispense:  21 tablet    Refill:  0      Procedures: No procedures performed   Clinical Data: No additional findings.   Subjective: Chief Complaint  Patient presents with  . Left Leg - Pain    HPI patient is a pleasant 62 year old gentleman who presents to our clinic today with left leg pain.  This radiates from the left lateral hip all the way down to the ankle.  This began this past Thursday without any injury or change in activity.  He describes this as a constant ache worse with standing all day.  He also has pain when he is sitting for too long.  He is tried ibuprofen with good relief of symptoms.  No numbness, tingling or burning.  No bowel or bladder changes and no saddle paresthesias.  He does have a previous MRI from October 2018 which shows a disc protrusion at L3-4 pressing on the thecal sac as well as a mild broad-based disc bulge at L5-S1 with mild left facet arthropathy and mild left foraminal stenosis.  No previous epidural steroid injection or surgical intervention.  Review of  Systems as detailed in HPI.  All others reviewed and are negative.   Objective: Vital Signs: There were no vitals taken for this visit.  Physical Exam well-developed well-nourished gentleman no acute distress.  Alert and oriented x3.  Ortho Exam examination of his lumbar spine reveals no spinous or paraspinous tenderness.  No increased pain with lumbar flexion or extension.  It is hard to palpate the greater trochanter due to the morbid obesity.  Negative logroll.  Positive straight leg raise.  He is neurovascularly intact distally.  Specialty Comments:  No specialty comments available.  Imaging: No new imaging today.   PMFS History: Patient Active Problem List   Diagnosis Date Noted  . Radiculopathy, lumbar region 01/17/2018  . Constipation 06/18/2017  . AKI (acute kidney injury) ([place]) 06/02/2017  . Low back pain 05/14/2017  . Proteinuria 11/22/2016  . Skin lesion 11/12/2015  . Preop exam for internal medicine 01/16/2015  . Cellulitis and abscess of leg 10/26/2013  . DM foot ulcer ([place]) 10/23/2013  . Preventative health care 07/05/2011  . History of colonic polyps 09/05/2009  . COLONIC POLYPS 03/14/2008  . DIVERTICULOSIS, COLON 03/14/2008  . Hyperlipidemia 06/27/2007  . CELLULITIS/ABSCESS, LEG 06/27/2007  . Diabetes ([place]) 05/21/2007  .  GOUT NOS 05/21/2007  . Morbid obesity ([place]) 05/21/2007  . ERECTILE DYSFUNCTION 05/21/2007  . OSA (obstructive sleep apnea) 05/21/2007  . Essential hypertension 05/21/2007   Past Medical History:  Diagnosis Date  . Arthritis    IN KNEES  . Cellulitis 10/26/2013   LOWER RT EXTREMITY  . CELLULITIS/ABSCESS, LEG 06/27/2007  . COLONIC POLYPS   . CONGESTIVE HEART FAILURE   . DIABETES MELLITUS, TYPE II   . DIVERTICULOSIS, COLON   . ERECTILE DYSFUNCTION, ORGANIC   . GOUT NOS   . Headache   . HYPERLIPIDEMIA   . HYPERTENSION   . LOW BACK PAIN   . Morbid obesity ([place])   . SLEEP APNEA, OBSTRUCTIVE    USES CPAP    Family History    Problem Relation Age of Onset  . Cardiomyopathy Father   . Asthma Other     Past Surgical History:  Procedure Laterality Date  . APPENDECTOMY    . FOOT SURGERY     LT  . TENOTOMY / FLEXOR TENDON TRANSFER Right 03/15/2015   Procedure: TENOTOMY HT REPAIR/ULCER DEBRIDEMENT/GRAFT PREP/ACELL GRAFT APPLICATION;  Surgeon: Jana Half, DPM;  Location: [facility];  Service: Podiatry;  Laterality: Right;  HALLUX   Social History   Occupational History  . Not on file  Tobacco Use  . Smoking status: Never Smoker  . Smokeless tobacco: Never Used  Substance and Sexual Activity  . Alcohol use: Yes    Alcohol/week: 1.2 oz    Types: 2 Cans of beer per week    Comment: 6 beers a week  . Drug use: No  . Sexual activity: Not on file

## 2018-01-18 ENCOUNTER — Telehealth (INDEPENDENT_AMBULATORY_CARE_PROVIDER_SITE_OTHER): Payer: Self-pay | Admitting: Orthopaedic Surgery

## 2018-01-18 NOTE — Telephone Encounter (Signed)
Will you call in same rx to pharmacy of patient choice?  Can you also check on [facility] referral?

## 2018-01-18 NOTE — Telephone Encounter (Signed)
See message below °

## 2018-01-18 NOTE — Telephone Encounter (Signed)
Patient called advised the pharmacy is no longer carrying (Prednisone) Patient asked if he can get another Rx or see Dr Ernestina Patches for an injection. The number to contact patient is [phone]  After 3:30 pm

## 2018-01-19 ENCOUNTER — Other Ambulatory Visit (INDEPENDENT_AMBULATORY_CARE_PROVIDER_SITE_OTHER): Payer: Self-pay

## 2018-01-19 MED ORDER — METHYLPREDNISOLONE 4 MG PO TBPK: ORAL_TABLET | ORAL | 0 refills | Status: DC

## 2018-01-19 NOTE — Telephone Encounter (Signed)
[facility] SA[facility] RX IS DISCONTINUED. REFAXED INTO [facility] [address] [facility].

## 2018-01-26 ENCOUNTER — Other Ambulatory Visit: Payer: Self-pay | Admitting: Internal Medicine

## 2018-02-08 ENCOUNTER — Other Ambulatory Visit (HOSPITAL_COMMUNITY): Payer: Self-pay | Admitting: Nephrology

## 2018-02-08 DIAGNOSIS — R809 Proteinuria, unspecified: Secondary | ICD-10-CM

## 2018-02-08 DIAGNOSIS — I10 Essential (primary) hypertension: Secondary | ICD-10-CM

## 2018-02-08 DIAGNOSIS — N179 Acute kidney failure, unspecified: Secondary | ICD-10-CM

## 2018-02-08 DIAGNOSIS — N182 Chronic kidney disease, stage 2 (mild): Secondary | ICD-10-CM

## 2018-02-11 ENCOUNTER — Encounter: Payer: Self-pay | Admitting: Internal Medicine

## 2018-02-23 ENCOUNTER — Other Ambulatory Visit: Payer: Self-pay | Admitting: Radiology

## 2018-02-24 ENCOUNTER — Other Ambulatory Visit: Payer: Self-pay | Admitting: Student

## 2018-02-25 ENCOUNTER — Ambulatory Visit (HOSPITAL_COMMUNITY)
Admission: RE | Admit: 2018-02-25 | Discharge: 2018-02-25 | Disposition: A | Discharge status: Home / Self Care | Payer: PRIVATE HEALTH INSURANCE | Source: Ambulatory Visit | Attending: Nephrology | Admitting: Nephrology

## 2018-02-25 ENCOUNTER — Other Ambulatory Visit (HOSPITAL_COMMUNITY): Payer: Self-pay | Admitting: Nephrology

## 2018-02-25 ENCOUNTER — Encounter (HOSPITAL_COMMUNITY): Payer: Self-pay

## 2018-02-25 DIAGNOSIS — N179 Acute kidney failure, unspecified: Secondary | ICD-10-CM | POA: Diagnosis present

## 2018-02-25 DIAGNOSIS — E1122 Type 2 diabetes mellitus with diabetic chronic kidney disease: Secondary | ICD-10-CM | POA: Diagnosis not present

## 2018-02-25 DIAGNOSIS — N182 Chronic kidney disease, stage 2 (mild): Secondary | ICD-10-CM | POA: Insufficient documentation

## 2018-02-25 DIAGNOSIS — R809 Proteinuria, unspecified: Secondary | ICD-10-CM

## 2018-02-25 DIAGNOSIS — E1121 Type 2 diabetes mellitus with diabetic nephropathy: Secondary | ICD-10-CM | POA: Diagnosis not present

## 2018-02-25 DIAGNOSIS — I10 Essential (primary) hypertension: Secondary | ICD-10-CM

## 2018-02-25 DIAGNOSIS — I129 Hypertensive chronic kidney disease with stage 1 through stage 4 chronic kidney disease, or unspecified chronic kidney disease: Secondary | ICD-10-CM | POA: Insufficient documentation

## 2018-02-25 LAB — CBC
HCT: 37.5 % — ABNORMAL LOW (ref 39.0–52.0)
Hemoglobin: 12.5 g/dL — ABNORMAL LOW (ref 13.0–17.0)
MCH: 30.7 pg (ref 26.0–34.0)
MCHC: 33.3 g/dL (ref 30.0–36.0)
MCV: 92.1 fL (ref 78.0–100.0)
PLATELETS: 195 10*3/uL (ref 150–400)
RBC: 4.07 MIL/uL — AB (ref 4.22–5.81)
RDW: 13.1 % (ref 11.5–15.5)
WBC: 7 10*3/uL (ref 4.0–10.5)

## 2018-02-25 LAB — BASIC METABOLIC PANEL
Anion gap: 7 (ref 5–15)
BUN: 29 mg/dL — AB (ref 6–20)
CALCIUM: 8.7 mg/dL — AB (ref 8.9–10.3)
CHLORIDE: 108 mmol/L (ref 101–111)
CO2: 24 mmol/L (ref 22–32)
CREATININE: 1.24 mg/dL (ref 0.61–1.24)
GFR calc Af Amer: >60 mL/min (ref >60)
GFR calc non Af Amer: >60 mL/min (ref >60)
Glucose, Bld: 173 mg/dL — ABNORMAL HIGH (ref 65–99)
Potassium: 4.3 mmol/L (ref 3.5–5.1)
SODIUM: 139 mmol/L (ref 135–145)

## 2018-02-25 LAB — GLUCOSE, CAPILLARY: Glucose-Capillary: 183 mg/dL — ABNORMAL HIGH (ref 65–99)

## 2018-02-25 LAB — PROTIME-INR
INR: 1.07
PROTHROMBIN TIME: 13.8 s (ref 11.4–15.2)

## 2018-02-25 MED ORDER — MIDAZOLAM HCL 2 MG/2ML IJ SOLN
INTRAMUSCULAR | Status: AC
  Filled 2018-02-25: qty 2

## 2018-02-25 MED ORDER — FENTANYL CITRATE (PF) 100 MCG/2ML IJ SOLN
INTRAMUSCULAR | Status: AC
  Filled 2018-02-25: qty 2

## 2018-02-25 MED ORDER — LIDOCAINE-EPINEPHRINE 1 %-1:100000 IJ SOLN
INTRAMUSCULAR | Status: AC
  Filled 2018-02-25: qty 1

## 2018-02-25 MED ORDER — SODIUM CHLORIDE 0.9 % IV SOLN: INTRAVENOUS | Status: DC

## 2018-02-25 MED ORDER — FENTANYL CITRATE (PF) 100 MCG/2ML IJ SOLN
INTRAMUSCULAR | Status: AC | PRN
  Administered 2018-02-25 (×2): 50 ug via INTRAVENOUS

## 2018-02-25 MED ORDER — HYDRALAZINE HCL 20 MG/ML IJ SOLN
INTRAMUSCULAR | Status: AC
  Filled 2018-02-25: qty 1

## 2018-02-25 MED ORDER — MIDAZOLAM HCL 2 MG/2ML IJ SOLN
INTRAMUSCULAR | Status: AC | PRN
  Administered 2018-02-25 (×2): 1 mg via INTRAVENOUS

## 2018-02-25 NOTE — H&P (Signed)
Chief Complaint: Patient was seen in consultation today for random renal biopsy at the request of Coladonato,Joseph  Referring Physician(s): Coladonato,Joseph  Supervising Physician: Sandi Mariscal  Patient Status: [facility] - Out-pt  History of Present Illness: Gerald Day is a 62 y.o. male   Long standing HTN Worsening proteinuria CKD2  Pt was scheduled for this same procedure 11/01/2017 BP too high to safely perform procedure He has since had changes in his meds and now with BP in 150s  Rescheduled today for random renal biopsy   Past Medical History:  Diagnosis Date  . Arthritis    IN KNEES  . Cellulitis 10/26/2013   LOWER RT EXTREMITY  . CELLULITIS/ABSCESS, LEG 06/27/2007  . COLONIC POLYPS   . CONGESTIVE HEART FAILURE   . DIABETES MELLITUS, TYPE II   . DIVERTICULOSIS, COLON   . ERECTILE DYSFUNCTION, ORGANIC   . GOUT NOS   . Headache   . HYPERLIPIDEMIA   . HYPERTENSION   . LOW BACK PAIN   . Morbid obesity ([place])   . SLEEP APNEA, OBSTRUCTIVE    USES CPAP    Past Surgical History:  Procedure Laterality Date  . APPENDECTOMY    . FOOT SURGERY     LT  . TENOTOMY / FLEXOR TENDON TRANSFER Right 03/15/2015   Procedure: TENOTOMY HT REPAIR/ULCER DEBRIDEMENT/GRAFT PREP/ACELL GRAFT APPLICATION;  Surgeon: Jana Half, DPM;  Location: [facility];  Service: Podiatry;  Laterality: Right;  HALLUX    Allergies: Penicillins  Medications: Prior to Admission medications   Medication Sig Start Date End Date Taking? Authorizing Provider  amLODipine (NORVASC) 10 MG tablet TAKE 1 TABLET (10 MG TOTAL) BY MOUTH DAILY. 11/17/17  Yes Biagio Borg, MD  atorvastatin (LIPITOR) 10 MG tablet TAKE 1 TABLET (10 MG TOTAL) BY MOUTH DAILY. 11/29/17  Yes Biagio Borg, MD  cloNIDine (CATAPRES) 0.3 MG tablet Take 0.3 mg by mouth 3 (three) times daily.   Yes [provider]  furosemide (LASIX) 80 MG tablet Take 1 tablet (80 mg total) by mouth daily as needed. Patient taking  differently: Take 80 mg by mouth 2 (two) times daily.  06/02/17  Yes Biagio Borg, MD  glipiZIDE (GLUCOTROL XL) 5 MG 24 hr tablet TAKE 1 TABLET BY MOUTH EVERY DAY WITH BREAKFAST 11/29/17  Yes Biagio Borg, MD  KLOR-CON M20 20 MEQ tablet TAKE 1 TABLET (20 MEQ TOTAL) BY MOUTH DAILY. 01/26/18  Yes Biagio Borg, MD  losartan (COZAAR) 50 MG tablet Take 2 tablets (100 mg total) by mouth daily. Patient taking differently: Take 50 mg by mouth daily.  05/19/17 05/19/18 Yes Biagio Borg, MD  metFORMIN (GLUCOPHAGE-XR) 500 MG 24 hr tablet TAKE 4 TABLETS BY MOUTH PER DAY Patient taking differently: TAKE 4 TABLETS BY MOUTH DAILY IN THE MORNING. 05/21/17  Yes Biagio Borg, MD  diclofenac sodium (VOLTAREN) 1 % GEL Apply 2 g topically 4 (four) times daily. Patient taking differently: Apply 2 g topically 3 (three) times daily.  10/11/17   Leandrew Koyanagi, MD  sildenafil (VIAGRA) 100 MG tablet Take 0.5-1 tablets (50-100 mg total) by mouth daily as needed for erectile dysfunction. 12/17/17   Biagio Borg, MD     Family History  Problem Relation Age of Onset  . Cardiomyopathy Father   . Asthma Other     Social History   Socioeconomic History  . Marital status: Married    Spouse name: Not on file  . Number of children: Not on  file  . Years of education: Not on file  . Highest education level: Not on file  Occupational History  . Not on file  Social Needs  . Financial resource strain: Not on file  . Food insecurity:    Worry: Not on file    Inability: Not on file  . Transportation needs:    Medical: Not on file    Non-medical: Not on file  Tobacco Use  . Smoking status: Never Smoker  . Smokeless tobacco: Never Used  Substance and Sexual Activity  . Alcohol use: Yes    Alcohol/week: 1.2 oz    Types: 2 Cans of beer per week    Comment: 6 beers a week  . Drug use: No  . Sexual activity: Not on file  Lifestyle  . Physical activity:    Days per week: Not on file    Minutes per session: Not on file    . Stress: Not on file  Relationships  . Social connections:    Talks on phone: Not on file    Gets together: Not on file    Attends religious service: Not on file    Active member of club or organization: Not on file    Attends meetings of clubs or organizations: Not on file    Relationship status: Not on file  Other Topics Concern  . Not on file  Social History Narrative  . Not on file    Review of Systems: A 12 point ROS discussed and pertinent positives are indicated in the HPI above.  All other systems are negative.  Review of Systems  Constitutional: Negative for activity change, fatigue and fever.  Respiratory: Negative for cough and shortness of breath.   Cardiovascular: Negative for chest pain.  Gastrointestinal: Negative for abdominal pain.  Neurological: Negative for weakness.  Psychiatric/Behavioral: Negative for behavioral problems and confusion.    Vital Signs: BP (!) 184/73   Pulse (!) 57   Temp 97.8 F (36.6 C) (Oral)   Resp 20   Ht 6' (1.829 m)   Wt (!) 389 lb (176.4 kg)   SpO2 98%   BMI 52.76 kg/m   Physical Exam  Constitutional: He is oriented to person, place, and time.  Morbid obese  Cardiovascular: Normal rate and regular rhythm.  Pulmonary/Chest: Effort normal and breath sounds normal.  Abdominal: Soft. Bowel sounds are normal. There is no tenderness.  Musculoskeletal: Normal range of motion.  Neurological: He is alert and oriented to person, place, and time.  Skin: Skin is warm and dry.  Psychiatric: He has a normal mood and affect. His behavior is normal. Judgment and thought content normal.  Vitals reviewed.   Imaging: No results found.  Labs:  CBC: Recent Labs    11/01/17 0601 12/13/17 0728 02/25/18 0547  WBC 8.4 7.9 7.0  HGB 12.8* 13.4 12.5*  HCT 38.1* 38.3* 37.5*  PLT 193 204.0 195    COAGS: Recent Labs    11/01/17 0601  INR 1.03    BMP: Recent Labs    05/28/17 0726 06/02/17 1623 06/11/17 1057 12/13/17 0728   NA 138 138 137 140  K 4.5 4.3 4.7 5.0  CL 103 103 100 104  CO2 25 28 29 29   GLUCOSE 166* 169* 128* 208*  BUN 31* 15 23 26*  CALCIUM 9.6 9.6 9.5 9.3  CREATININE 1.57* 1.04 1.16 1.09    LIVER FUNCTION TESTS: Recent Labs    05/28/17 0726 12/13/17 0728  BILITOT 0.8 0.5  AST  14 13  ALT 21 17  ALKPHOS 56 73  PROT 7.2 6.7  ALBUMIN 3.7 3.7    TUMOR MARKERS: No results for input(s): AFPTM, CEA, CA199, CHROMGRNA in the last 8760 hours.  Assessment and Plan:  Worsening proteinuria Long standing HTN CKD 2 Rescheduled for random renal biopsy from 10/2017 (BP too high for procedure) Risks and benefits discussed with the patient including, but not limited to bleeding, infection, damage to adjacent structures or low yield requiring additional tests.  All of the patient's questions were answered, patient is agreeable to proceed. Consent signed and in chart.   Thank you for this interesting consult.  I greatly enjoyed meeting Gerald Day and look forward to participating in their care.  A copy of this report was sent to the requesting provider on this date.  Electronically Signed: Lavonia Drafts, PA-C 02/25/2018, 6:54 AM   I spent a total of    25 Minutes in face to face in clinical consultation, greater than 50% of which was counseling/coordinating care for random renal biopsy

## 2018-02-25 NOTE — Procedures (Signed)
Pre Procedure Dx: Proteinuria Post Procedural Dx: Same  Technically successful US guided biopsy of inferior pole of the left kidney.  EBL: None  No immediate complications.   Jay Janiyha Montufar, MD Pager #: [phone]   

## 2018-02-25 NOTE — Discharge Instructions (Addendum)

## 2018-03-07 ENCOUNTER — Other Ambulatory Visit: Payer: Self-pay | Admitting: Internal Medicine

## 2018-06-11 ENCOUNTER — Other Ambulatory Visit: Payer: Self-pay | Admitting: Internal Medicine

## 2018-06-17 ENCOUNTER — Other Ambulatory Visit (INDEPENDENT_AMBULATORY_CARE_PROVIDER_SITE_OTHER): Payer: PRIVATE HEALTH INSURANCE

## 2018-06-17 DIAGNOSIS — Z114 Encounter for screening for human immunodeficiency virus [HIV]: Secondary | ICD-10-CM

## 2018-06-17 DIAGNOSIS — Z1159 Encounter for screening for other viral diseases: Secondary | ICD-10-CM

## 2018-06-17 DIAGNOSIS — E119 Type 2 diabetes mellitus without complications: Secondary | ICD-10-CM

## 2018-06-17 LAB — HEMOGLOBIN A1C: Hgb A1c MFr Bld: 6.5 % (ref 4.6–6.5)

## 2018-06-17 LAB — LIPID PANEL
CHOLESTEROL: 144 mg/dL (ref 0–200)
HDL: 53.9 mg/dL (ref >39.00)
LDL Cholesterol: 57 mg/dL (ref 0–99)
NonHDL: 90.11
TRIGLYCERIDES: 168 mg/dL — AB (ref 0.0–149.0)
Total CHOL/HDL Ratio: 3
VLDL: 33.6 mg/dL (ref 0.0–40.0)

## 2018-06-17 LAB — BASIC METABOLIC PANEL
BUN: 34 mg/dL — ABNORMAL HIGH (ref 6–23)
CALCIUM: 9.7 mg/dL (ref 8.4–10.5)
CO2: 26 mEq/L (ref 19–32)
Chloride: 106 mEq/L (ref 96–112)
Creatinine, Ser: 1.2 mg/dL (ref 0.40–1.50)
GFR: 64.89 mL/min (ref >60.00)
Glucose, Bld: 177 mg/dL — ABNORMAL HIGH (ref 70–99)
POTASSIUM: 4.6 meq/L (ref 3.5–5.1)
SODIUM: 142 meq/L (ref 135–145)

## 2018-06-17 LAB — HEPATIC FUNCTION PANEL
ALBUMIN: 3.8 g/dL (ref 3.5–5.2)
ALT: 14 U/L (ref 0–53)
AST: 11 U/L (ref 0–37)
Alkaline Phosphatase: 65 U/L (ref 39–117)
Bilirubin, Direct: 0.1 mg/dL (ref 0.0–0.3)
TOTAL PROTEIN: 7.3 g/dL (ref 6.0–8.3)
Total Bilirubin: 0.5 mg/dL (ref 0.2–1.2)

## 2018-06-18 LAB — HIV ANTIBODY (ROUTINE TESTING W REFLEX): HIV: NONREACTIVE

## 2018-06-18 LAB — HEPATITIS C ANTIBODY
Hepatitis C Ab: NONREACTIVE
SIGNAL TO CUT-OFF: 0.03 (ref <1.00)

## 2018-06-21 ENCOUNTER — Ambulatory Visit: Payer: PRIVATE HEALTH INSURANCE | Admitting: Internal Medicine

## 2018-06-21 ENCOUNTER — Encounter: Payer: Self-pay | Admitting: Internal Medicine

## 2018-06-21 VITALS — BP 148/90 | HR 71 | Temp 98.2°F | Ht 72.0 in | Wt 394.0 lb

## 2018-06-21 DIAGNOSIS — E119 Type 2 diabetes mellitus without complications: Secondary | ICD-10-CM | POA: Diagnosis not present

## 2018-06-21 DIAGNOSIS — I10 Essential (primary) hypertension: Secondary | ICD-10-CM | POA: Diagnosis not present

## 2018-06-21 DIAGNOSIS — E785 Hyperlipidemia, unspecified: Secondary | ICD-10-CM

## 2018-06-21 DIAGNOSIS — Z0000 Encounter for general adult medical examination without abnormal findings: Secondary | ICD-10-CM | POA: Diagnosis not present

## 2018-06-21 NOTE — Assessment & Plan Note (Signed)
stable overall by history and exam, recent data reviewed with pt, and pt to continue medical treatment as before,  to f/u any worsening symptoms or concerns  

## 2018-06-21 NOTE — Progress Notes (Signed)
Subjective:    Patient ID: Gerald Day, male    DOB: [birth year], 62 y.o.   MRN: [id number]  HPI   Here to f/u; overall doing ok,  Pt denies chest pain, increasing sob or doe, wheezing, orthopnea, PND, increased LE swelling, palpitations, dizziness or syncope.  Pt denies new neurological symptoms such as new headache, or facial or extremity weakness or numbness.  Pt denies polydipsia, polyuria, or low sugar episode.  Pt states overall good compliance with meds, mostly trying to follow appropriate diet, with wt overall stable,  but little exercise however.   Pt denies fever, wt loss, night sweats, loss of appetite, or other constitutional symptoms  No new complaints Past Medical History:  Diagnosis Date  . Arthritis    IN KNEES  . Cellulitis 10/26/2013   LOWER RT EXTREMITY  . CELLULITIS/ABSCESS, LEG 06/27/2007  . COLONIC POLYPS   . CONGESTIVE HEART FAILURE   . DIABETES MELLITUS, TYPE II   . DIVERTICULOSIS, COLON   . ERECTILE DYSFUNCTION, ORGANIC   . GOUT NOS   . Headache   . HYPERLIPIDEMIA   . HYPERTENSION   . LOW BACK PAIN   . Morbid obesity ([place])   . SLEEP APNEA, OBSTRUCTIVE    USES CPAP   Past Surgical History:  Procedure Laterality Date  . APPENDECTOMY    . FOOT SURGERY     LT  . TENOTOMY / FLEXOR TENDON TRANSFER Right 03/15/2015   Procedure: TENOTOMY HT REPAIR/ULCER DEBRIDEMENT/GRAFT PREP/ACELL GRAFT APPLICATION;  Surgeon: Jana Half, DPM;  Location: [facility];  Service: Podiatry;  Laterality: Right;  HALLUX    reports that he has never smoked. He has never used smokeless tobacco. He reports that he drinks about 2.0 standard drinks of alcohol per week. He reports that he does not use drugs. family history includes Asthma in his other; Cardiomyopathy in his father. Allergies  Allergen Reactions  . Penicillins Other (See Comments)    Unknown childhood allergic reaction Has patient had a PCN reaction causing immediate rash, facial/tongue/throat swelling, SOB or  lightheadedness with hypotension: Unknown Has patient had a PCN reaction causing severe rash involving mucus membranes or skin necrosis: Unknown Has patient had a PCN reaction that required hospitalization: No Has patient had a PCN reaction occurring within the last 10 years: No If all of the above answers are "NO", then may proceed with Cephalosporin use.    Current Outpatient Medications on File Prior to Visit  Medication Sig Dispense Refill  . amLODipine (NORVASC) 10 MG tablet TAKE 1 TABLET (10 MG TOTAL) BY MOUTH DAILY. 90 tablet 1  . atorvastatin (LIPITOR) 10 MG tablet TAKE 1 TABLET (10 MG TOTAL) BY MOUTH DAILY. 90 tablet 1  . cloNIDine (CATAPRES) 0.3 MG tablet Take 0.3 mg by mouth 3 (three) times daily.    . diclofenac sodium (VOLTAREN) 1 % GEL Apply 2 g topically 4 (four) times daily. (Patient taking differently: Apply 2 g topically 3 (three) times daily. ) 1 Tube 5  . furosemide (LASIX) 80 MG tablet Take 1 tablet (80 mg total) by mouth daily as needed. (Patient taking differently: Take 80 mg by mouth 2 (two) times daily. ) 90 tablet 3  . glipiZIDE (GLUCOTROL XL) 5 MG 24 hr tablet TAKE 1 TABLET BY MOUTH EVERY DAY WITH BREAKFAST 90 tablet 2  . KLOR-CON M20 20 MEQ tablet TAKE 1 TABLET (20 MEQ TOTAL) BY MOUTH DAILY. 30 tablet 11  . metFORMIN (GLUCOPHAGE-XR) 500 MG 24 hr tablet TAKE 4  TABLETS BY MOUTH PER DAY 360 tablet 2  . sildenafil (VIAGRA) 100 MG tablet Take 0.5-1 tablets (50-100 mg total) by mouth daily as needed for erectile dysfunction. 5 tablet 11  . losartan (COZAAR) 50 MG tablet Take 2 tablets (100 mg total) by mouth daily. (Patient taking differently: Take 50 mg by mouth daily. ) 180 tablet 3   No current facility-administered medications on file prior to visit.    Review of Systems  Constitutional: Negative for other unusual diaphoresis or sweats HENT: Negative for ear discharge or swelling Eyes: Negative for other worsening visual disturbances Respiratory: Negative for  stridor or other swelling  Gastrointestinal: Negative for worsening distension or other blood Genitourinary: Negative for retention or other urinary change Musculoskeletal: Negative for other MSK pain or swelling Skin: Negative for color change or other new lesions Neurological: Negative for worsening tremors and other numbness  Psychiatric/Behavioral: Negative for worsening agitation or other fatigue All other system neg per pt    Objective:   Physical Exam BP (!) 148/90   Pulse 71   Temp 98.2 F (36.8 C) (Oral)   Ht 6' (1.829 m)   Wt (!) 394 lb (178.7 kg)   SpO2 95%   BMI 53.44 kg/m  VS noted,  Constitutional: Pt appears in NAD HENT: Head: NCAT.  Right Ear: External ear normal.  Left Ear: External ear normal.  Eyes: . Pupils are equal, round, and reactive to light. Conjunctivae and EOM are normal Nose: without d/c or deformity Neck: Neck supple. Gross normal ROM Cardiovascular: Normal rate and regular rhythm.   Pulmonary/Chest: Effort normal and breath sounds without rales or wheezing.  Abd:  Soft, NT, ND, + BS, no organomegaly Neurological: Pt is alert. At baseline orientation, motor grossly intact Skin: Skin is warm. No rashes, other new lesions, chronic 1+ bilat LE edema Psychiatric: Pt behavior is normal without agitation  No other exam findings Lab Results  Component Value Date   WBC 7.0 02/25/2018   HGB 12.5 (L) 02/25/2018   HCT 37.5 (L) 02/25/2018   PLT 195 02/25/2018   GLUCOSE 177 (H) 06/17/2018   CHOL 144 06/17/2018   TRIG 168.0 (H) 06/17/2018   HDL 53.90 06/17/2018   LDLDIRECT 107.6 05/08/2014   LDLCALC 57 06/17/2018   ALT 14 06/17/2018   AST 11 06/17/2018   NA 142 06/17/2018   K 4.6 06/17/2018   CL 106 06/17/2018   CREATININE 1.20 06/17/2018   BUN 34 (H) 06/17/2018   CO2 26 06/17/2018   TSH 3.94 12/13/2017   PSA 0.48 12/13/2017   INR 1.07 02/25/2018   HGBA1C 6.5 06/17/2018   MICROALBUR 355.0 (H) 12/13/2017        Assessment & Plan:

## 2018-06-21 NOTE — Patient Instructions (Signed)
Please continue all other medications as before, and refills have been done if requested.  Please have the pharmacy call with any other refills you may need.  Please continue your efforts at being more active, low cholesterol diet, and weight control..  Please keep your appointments with your specialists as you may have planned  Please return in 6 months, or sooner if needed, with Lab testing done 3-5 days before  

## 2018-07-22 ENCOUNTER — Other Ambulatory Visit: Payer: Self-pay | Admitting: Internal Medicine

## 2018-10-13 ENCOUNTER — Other Ambulatory Visit: Payer: Self-pay | Admitting: Internal Medicine

## 2018-10-17 ENCOUNTER — Ambulatory Visit: Payer: PRIVATE HEALTH INSURANCE | Admitting: Nurse Practitioner

## 2018-10-17 ENCOUNTER — Encounter: Payer: Self-pay | Admitting: Nurse Practitioner

## 2018-10-17 VITALS — BP 146/84 | HR 90 | Temp 98.8°F | Resp 18 | Ht 72.0 in | Wt 386.8 lb

## 2018-10-17 DIAGNOSIS — M25572 Pain in left ankle and joints of left foot: Secondary | ICD-10-CM | POA: Diagnosis not present

## 2018-10-17 DIAGNOSIS — I10 Essential (primary) hypertension: Secondary | ICD-10-CM | POA: Diagnosis not present

## 2018-10-17 DIAGNOSIS — M25571 Pain in right ankle and joints of right foot: Secondary | ICD-10-CM

## 2018-10-17 DIAGNOSIS — M109 Gout, unspecified: Secondary | ICD-10-CM | POA: Diagnosis not present

## 2018-10-17 MED ORDER — PREDNISONE 20 MG PO TABS: 40.0000 mg | ORAL_TABLET | Freq: Every day | ORAL | 0 refills | Status: DC

## 2018-10-17 NOTE — Progress Notes (Signed)
Gerald Day is a 63 y.o. male with the following history as recorded in EpicCare:  Patient Active Problem List   Diagnosis Date Noted  . Radiculopathy, lumbar region 01/17/2018  . Constipation 06/18/2017  . AKI (acute kidney injury) ([place]) 06/02/2017  . Low back pain 05/14/2017  . Proteinuria 11/22/2016  . Skin lesion 11/12/2015  . Preop exam for internal medicine 01/16/2015  . Cellulitis and abscess of leg 10/26/2013  . DM foot ulcer ([place]) 10/23/2013  . Preventative health care 07/05/2011  . History of colonic polyps 09/05/2009  . COLONIC POLYPS 03/14/2008  . DIVERTICULOSIS, COLON 03/14/2008  . Hyperlipidemia 06/27/2007  . CELLULITIS/ABSCESS, LEG 06/27/2007  . Diabetes ([place]) 05/21/2007  . GOUT NOS 05/21/2007  . Morbid obesity ([place]) 05/21/2007  . ERECTILE DYSFUNCTION 05/21/2007  . OSA (obstructive sleep apnea) 05/21/2007  . Essential hypertension 05/21/2007    Current Outpatient Medications  Medication Sig Dispense Refill  . amLODipine (NORVASC) 10 MG tablet TAKE 1 TABLET (10 MG TOTAL) BY MOUTH DAILY. 90 tablet 1  . atorvastatin (LIPITOR) 10 MG tablet TAKE 1 TABLET (10 MG TOTAL) BY MOUTH DAILY. 90 tablet 1  . cloNIDine (CATAPRES) 0.3 MG tablet Take 0.3 mg by mouth 3 (three) times daily.    . diclofenac sodium (VOLTAREN) 1 % GEL Apply 2 g topically 4 (four) times daily. (Patient taking differently: Apply 2 g topically 3 (three) times daily. ) 1 Tube 5  . furosemide (LASIX) 80 MG tablet TAKE 1 TABLET (80 MG TOTAL) BY MOUTH DAILY AS NEEDED. 90 tablet 1  . glipiZIDE (GLUCOTROL XL) 5 MG 24 hr tablet TAKE 1 TABLET BY MOUTH EVERY DAY WITH BREAKFAST 90 tablet 1  . KLOR-CON M20 20 MEQ tablet TAKE 1 TABLET (20 MEQ TOTAL) BY MOUTH DAILY. 30 tablet 11  . metFORMIN (GLUCOPHAGE-XR) 500 MG 24 hr tablet TAKE 4 TABLETS BY MOUTH PER DAY 360 tablet 2  . sildenafil (VIAGRA) 100 MG tablet Take 0.5-1 tablets (50-100 mg total) by mouth daily as needed for erectile dysfunction. 5 tablet 11  .  losartan (COZAAR) 50 MG tablet Take 2 tablets (100 mg total) by mouth daily. (Patient taking differently: Take 50 mg by mouth daily. ) 180 tablet 3  . predniSONE (DELTASONE) 20 MG tablet Take 2 tablets (40 mg total) by mouth daily with breakfast. 10 tablet 0   No current facility-administered medications for this visit.     Allergies: Penicillins  Past Medical History:  Diagnosis Date  . Arthritis    IN KNEES  . Cellulitis 10/26/2013   LOWER RT EXTREMITY  . CELLULITIS/ABSCESS, LEG 06/27/2007  . COLONIC POLYPS   . CONGESTIVE HEART FAILURE   . DIABETES MELLITUS, TYPE II   . DIVERTICULOSIS, COLON   . ERECTILE DYSFUNCTION, ORGANIC   . GOUT NOS   . Headache   . HYPERLIPIDEMIA   . HYPERTENSION   . LOW BACK PAIN   . Morbid obesity ([place])   . SLEEP APNEA, OBSTRUCTIVE    USES CPAP    Past Surgical History:  Procedure Laterality Date  . APPENDECTOMY    . FOOT SURGERY     LT  . TENOTOMY / FLEXOR TENDON TRANSFER Right 03/15/2015   Procedure: TENOTOMY HT REPAIR/ULCER DEBRIDEMENT/GRAFT PREP/ACELL GRAFT APPLICATION;  Surgeon: Jana Half, DPM;  Location: [facility];  Service: Podiatry;  Laterality: Right;  HALLUX    Family History  Problem Relation Age of Onset  . Cardiomyopathy Father   . Asthma Other     Social History  Tobacco Use  . Smoking status: Never Smoker  . Smokeless tobacco: Never Used  Substance Use Topics  . Alcohol use: Yes    Alcohol/week: 2.0 standard drinks    Types: 2 Cans of beer per week    Comment: 6 beers a week     Subjective:  Gerald Day is here today requesting evaluation of bilateral ankle pain which first began when waking this morning. He says his ankles "feel inflamed, feels like when I had gout years ago." he went to work but had to leave early because of the pain. Pain is a little better now after sitting to rest. He denies fevers, chills, weakness, syncope, chest pain, sob, numbness/tingling, injuries, falls. He has chronic BLE edema, erythema, dry  skin, and tells me today these problems are no worse /different today. He has not tried anything for pain He stands all day to work in [employer], long shifts, off tomorrow.  BP Readings from Last 3 Encounters:  10/17/18 (!) 146/84  06/21/18 (!) 148/90  02/25/18 (!) 160/76   ROS- See HPI   Objective:  Vitals:   10/17/18 1405 10/17/18 1441  BP: (!) 172/94 (!) 146/84  Pulse: 90   Resp: 18   Temp: 98.8 F (37.1 C)   TempSrc: Oral   SpO2: 97%   Weight: (!) 386 lb 12 oz (175.4 kg)   Height: 6' (1.829 m)     General: Well developed, well nourished, in no acute distress  Skin : Warm and dry. Mild diffuse erythema and dry, scaly skin to BLE Head: Normocephalic and atraumatic  Eyes: Sclera and conjunctiva clear; pupils round and reactive to light; extraocular movements intact  Oropharynx: Pink, supple. No suspicious lesions  Neck: Supple Lungs: Respirations unlabored; clear to auscultation bilaterally  CVS exam: Normal rate and regular rhythm.   Musculoskeletal: No deformities. Extremities: No cyanosis, clubbing. BLE edema.  Vessels: Symmetric bilaterally  Neurologic: Alert and oriented; speech intact; face symmetrical; moves all extremities well; CNII-XII intact without focal deficit  Psychiatric: Normal mood and affect.   Assessment:  1. Acute bilateral ankle pain   2. Acute gout of ankle, unspecified cause, unspecified laterality   3. Essential hypertension     Plan:   Acute gout of ankle, unspecified cause, unspecified laterality, Acute bilateral ankle pain Unsure if this could be gout flare Will treat with short steroid course, He is diabetic-medication dosing, side effects discussed Home management, red flags and return precautions including when to seek immediate/emergency care discussed and printed on AVS RTC in 1 week to check response to steroid treatment  Return in about 1 week (around 10/24/2018) for Follow up of ankle pain, BP.  No orders of the defined  types were placed in this encounter.   Requested Prescriptions   Signed Prescriptions Disp Refills  . predniSONE (DELTASONE) 20 MG tablet 10 tablet 0    Sig: Take 2 tablets (40 mg total) by mouth daily with breakfast.

## 2018-10-17 NOTE — Assessment & Plan Note (Signed)
BP slightly elevated on 2 readings today Here for acute visit RTC in 1 week for follow up, recheck BP

## 2018-10-17 NOTE — Patient Instructions (Addendum)
Complete prednisone course as prescribed  Return in 1 week for follow up visit with Dr Jenny Reichmann to have your ankles and blood pressure rechecked   Ankle Pain The ankle joint holds your body weight and allows you to move around. Ankle pain can occur on either side or the back of one ankle or both ankles. Ankle pain may be sharp and burning or dull and aching. There may be tenderness, stiffness, redness, or warmth around the ankle. Many things can cause ankle pain, including an injury to the area and overuse of the ankle. Follow these instructions at home: Activity  Rest your ankle as told by your health care provider. Avoid any activities that cause ankle pain.  Do not use the injured limb to support your body weight until your health care provider says that you can. Use crutches as told by your health care provider.  Do exercises as told by your health care provider.  Ask your health care provider when it is safe to drive if you have a brace on your ankle. If you have a brace:  Wear the brace as told by your health care provider. Remove it only as told by your health care provider.  Loosen the brace if your toes tingle, become numb, or turn cold and blue.  Keep the brace clean.  If the brace is not waterproof: ? Do not let it get wet. ? Cover it with a watertight covering when you take a bath or shower. If you were given an elastic bandage:   Remove it when you take a bath or a shower.  Try not to move your ankle very much, but wiggle your toes from time to time. This helps to prevent swelling.  Adjust the bandage to make it more comfortable if it feels too tight.  Loosen the bandage if you have numbness or tingling in your foot or if your foot turns cold and blue. Managing pain, stiffness, and swelling   If directed, put ice on the painful area. ? If you have a removable brace or elastic bandage, remove it as told by your health care provider. ? Put ice in a plastic  bag. ? Place a towel between your skin and the bag. ? Leave the ice on for 20 minutes, 2-3 times a day.  Move your toes often to avoid stiffness and to lessen swelling.  Raise (elevate) your ankle above the level of your heart while you are sitting or lying down. General instructions  Record information about your pain. Writing down the following may be helpful for you and your health care provider: ? How often you have ankle pain. ? Where the pain is located. ? What the pain feels like.  If treatment involves wearing a prescribed shoe or insole, make sure you wear it correctly and for as long as told by your health care provider.  Take over-the-counter and prescription medicines only as told by your health care provider.  Keep all follow-up visits as told by your health care provider. This is important. Contact a health care provider if:  Your pain gets worse.  Your pain is not relieved with medicines.  You have a fever or chills.  You are having more trouble with walking.  You have new symptoms. Get help right away if:  Your foot, leg, toes, or ankle: ? Tingles or becomes numb. ? Becomes swollen. ? Turns pale or blue. Summary  Ankle pain can occur on either side or the back of one  ankle or both ankles.  Ankle pain may be sharp and burning or dull and aching.  Rest your ankle as told by your health care provider. If told, apply ice to the area.  Take over-the-counter and prescription medicines only as told by your health care provider. This information is not intended to replace advice given to you by your health care provider. Make sure you discuss any questions you have with your health care provider. Document Released: 02/11/2010 Document Revised: 03/02/2018 Document Reviewed: 03/02/2018 Elsevier Interactive Patient Education  2019 [employer].

## 2018-10-24 ENCOUNTER — Ambulatory Visit: Payer: PRIVATE HEALTH INSURANCE | Admitting: Internal Medicine

## 2018-10-24 ENCOUNTER — Encounter: Payer: Self-pay | Admitting: Internal Medicine

## 2018-10-24 VITALS — BP 146/88 | HR 72 | Temp 97.9°F | Ht 72.0 in | Wt 392.0 lb

## 2018-10-24 DIAGNOSIS — E119 Type 2 diabetes mellitus without complications: Secondary | ICD-10-CM | POA: Diagnosis not present

## 2018-10-24 DIAGNOSIS — M109 Gout, unspecified: Secondary | ICD-10-CM | POA: Diagnosis not present

## 2018-10-24 DIAGNOSIS — I10 Essential (primary) hypertension: Secondary | ICD-10-CM | POA: Diagnosis not present

## 2018-10-24 MED ORDER — TELMISARTAN 80 MG PO TABS: 80.0000 mg | ORAL_TABLET | Freq: Every day | ORAL | 3 refills | Status: DC

## 2018-10-24 MED ORDER — INDOMETHACIN 50 MG PO CAPS: 50.0000 mg | ORAL_CAPSULE | Freq: Three times a day (TID) | ORAL | 1 refills | Status: DC | PRN

## 2018-10-24 NOTE — Progress Notes (Signed)
Subjective:    Patient ID: Gerald Day, male    DOB: 05/05/1956, 63 y.o.   MRN: [id number]  HPI  Here to f/u episode of several days severe bilateral ankle pain treated as presumed gout, and did very well with a near resolution of pain with steroid tx in 3-4 days.  No fever, trauma, or recurrence.  No falls and ambulates now as per usual.  Has chronic bilat LE edema no change.  Missed several days of work but now back to usual work hours.  Pt denies chest pain, increased sob or doe, wheezing, orthopnea, PND, increased LE swelling, palpitations, dizziness or syncope.  Pt denies new neurological symptoms such as new headache, or facial or extremity weakness or numbness   Pt denies polydipsia, polyuria, .  States BP at home < 140/90 BP Readings from Last 3 Encounters:  10/24/18 (!) 146/88  10/17/18 (!) 146/84  06/21/18 (!) 148/90   Past Medical History:  Diagnosis Date  . Arthritis    IN KNEES  . Cellulitis 10/26/2013   LOWER RT EXTREMITY  . CELLULITIS/ABSCESS, LEG 06/27/2007  . COLONIC POLYPS   . CONGESTIVE HEART FAILURE   . DIABETES MELLITUS, TYPE II   . DIVERTICULOSIS, COLON   . ERECTILE DYSFUNCTION, ORGANIC   . GOUT NOS   . Headache   . HYPERLIPIDEMIA   . HYPERTENSION   . LOW BACK PAIN   . Morbid obesity ([place])   . SLEEP APNEA, OBSTRUCTIVE    USES CPAP   Past Surgical History:  Procedure Laterality Date  . APPENDECTOMY    . FOOT SURGERY     LT  . TENOTOMY / FLEXOR TENDON TRANSFER Right 03/15/2015   Procedure: TENOTOMY HT REPAIR/ULCER DEBRIDEMENT/GRAFT PREP/ACELL GRAFT APPLICATION;  Surgeon: Jana Half, DPM;  Location: [facility];  Service: Podiatry;  Laterality: Right;  HALLUX    reports that he has never smoked. He has never used smokeless tobacco. He reports current alcohol use of about 2.0 standard drinks of alcohol per week. He reports that he does not use drugs. family history includes Asthma in an other family member; Cardiomyopathy in his father. Allergies    Allergen Reactions  . Penicillins Other (See Comments)    Unknown childhood allergic reaction Has patient had a PCN reaction causing immediate rash, facial/tongue/throat swelling, SOB or lightheadedness with hypotension: Unknown Has patient had a PCN reaction causing severe rash involving mucus membranes or skin necrosis: Unknown Has patient had a PCN reaction that required hospitalization: No Has patient had a PCN reaction occurring within the last 10 years: No If all of the above answers are "NO", then may proceed with Cephalosporin use.    Current Outpatient Medications on File Prior to Visit  Medication Sig Dispense Refill  . amLODipine (NORVASC) 10 MG tablet TAKE 1 TABLET (10 MG TOTAL) BY MOUTH DAILY. 90 tablet 1  . atorvastatin (LIPITOR) 10 MG tablet TAKE 1 TABLET (10 MG TOTAL) BY MOUTH DAILY. 90 tablet 1  . cloNIDine (CATAPRES) 0.3 MG tablet Take 0.3 mg by mouth 3 (three) times daily.    . diclofenac sodium (VOLTAREN) 1 % GEL Apply 2 g topically 4 (four) times daily. (Patient taking differently: Apply 2 g topically 3 (three) times daily. ) 1 Tube 5  . furosemide (LASIX) 80 MG tablet TAKE 1 TABLET (80 MG TOTAL) BY MOUTH DAILY AS NEEDED. 90 tablet 1  . glipiZIDE (GLUCOTROL XL) 5 MG 24 hr tablet TAKE 1 TABLET BY MOUTH EVERY DAY WITH BREAKFAST  90 tablet 1  . KLOR-CON M20 20 MEQ tablet TAKE 1 TABLET (20 MEQ TOTAL) BY MOUTH DAILY. 30 tablet 11  . metFORMIN (GLUCOPHAGE-XR) 500 MG 24 hr tablet TAKE 4 TABLETS BY MOUTH PER DAY 360 tablet 2  . predniSONE (DELTASONE) 20 MG tablet Take 2 tablets (40 mg total) by mouth daily with breakfast. 10 tablet 0  . sildenafil (VIAGRA) 100 MG tablet Take 0.5-1 tablets (50-100 mg total) by mouth daily as needed for erectile dysfunction. 5 tablet 11   No current facility-administered medications on file prior to visit.    Review of Systems  Constitutional: Negative for other unusual diaphoresis or sweats HENT: Negative for ear discharge or swelling Eyes:  Negative for other worsening visual disturbances Respiratory: Negative for stridor or other swelling  Gastrointestinal: Negative for worsening distension or other blood Genitourinary: Negative for retention or other urinary change Musculoskeletal: Negative for other MSK pain or swelling Skin: Negative for color change or other new lesions Neurological: Negative for worsening tremors and other numbness  Psychiatric/Behavioral: Negative for worsening agitation or other fatigue All other system neg per pt    Objective:   Physical Exam BP (!) 146/88   Pulse 72   Temp 97.9 F (36.6 C) (Oral)   Ht 6' (1.829 m)   Wt (!) 392 lb (177.8 kg)   SpO2 94%   BMI 53.16 kg/m  VS noted,  Constitutional: Pt appears in NAD HENT: Head: NCAT.  Right Ear: External ear normal.  Left Ear: External ear normal.  Eyes: . Pupils are equal, round, and reactive to light. Conjunctivae and EOM are normal Nose: without d/c or deformity Neck: Neck supple. Gross normal ROM Cardiovascular: Normal rate and regular rhythm.   Pulmonary/Chest: Effort normal and breath sounds without rales or wheezing.  Abd:  Soft, NT, ND, + BS, no organomegaly Neurological: Pt is alert. At baseline orientation, motor gross intact Skin: Skin is warm. No rashes, other new lesions, chronic 1-2+ bilat LE edema Psychiatric: Pt behavior is normal without agitation  No other exam findings Lab Results  Component Value Date   WBC 7.0 02/25/2018   HGB 12.5 (L) 02/25/2018   HCT 37.5 (L) 02/25/2018   PLT 195 02/25/2018   GLUCOSE 177 (H) 06/17/2018   CHOL 144 06/17/2018   TRIG 168.0 (H) 06/17/2018   HDL 53.90 06/17/2018   LDLDIRECT 107.6 05/08/2014   LDLCALC 57 06/17/2018   ALT 14 06/17/2018   AST 11 06/17/2018   NA 142 06/17/2018   K 4.6 06/17/2018   CL 106 06/17/2018   CREATININE 1.20 06/17/2018   BUN 34 (H) 06/17/2018   CO2 26 06/17/2018   TSH 3.94 12/13/2017   PSA 0.48 12/13/2017   INR 1.07 02/25/2018   HGBA1C 6.5  06/17/2018   MICROALBUR 355.0 (H) 12/13/2017       Assessment & Plan:

## 2018-10-24 NOTE — Assessment & Plan Note (Signed)
stable overall by history and exam, recent data reviewed with pt, and pt to continue medical treatment as before,  to f/u any worsening symptoms or concerns  

## 2018-10-24 NOTE — Assessment & Plan Note (Signed)
Improved, for indocin prn,  to f/u any worsening symptoms or concerns

## 2018-10-24 NOTE — Patient Instructions (Signed)
Please take all new medication as prescribed - the indocin as needed only for acute gout  Please continue all other medications as before, and refills have been done if requested.  Please have the pharmacy call with any other refills you may need.  Please continue your efforts at being more active, low cholesterol diet, and weight control.  Please keep your appointments with your specialists as you may have planned

## 2018-12-19 ENCOUNTER — Other Ambulatory Visit (INDEPENDENT_AMBULATORY_CARE_PROVIDER_SITE_OTHER): Payer: PRIVATE HEALTH INSURANCE

## 2018-12-19 DIAGNOSIS — Z125 Encounter for screening for malignant neoplasm of prostate: Secondary | ICD-10-CM | POA: Diagnosis not present

## 2018-12-19 DIAGNOSIS — Z0000 Encounter for general adult medical examination without abnormal findings: Secondary | ICD-10-CM

## 2018-12-19 DIAGNOSIS — E119 Type 2 diabetes mellitus without complications: Secondary | ICD-10-CM

## 2018-12-19 LAB — HEMOGLOBIN A1C: Hgb A1c MFr Bld: 6.9 % — ABNORMAL HIGH (ref 4.6–6.5)

## 2018-12-19 LAB — URINALYSIS, ROUTINE W REFLEX MICROSCOPIC
Bilirubin Urine: NEGATIVE
Ketones, ur: NEGATIVE
Leukocytes,Ua: NEGATIVE
Nitrite: NEGATIVE
Specific Gravity, Urine: 1.025 (ref 1.000–1.030)
Total Protein, Urine: >=300 — AB
Urine Glucose: NEGATIVE
Urobilinogen, UA: 0.2 (ref 0.0–1.0)
pH: 5.5 (ref 5.0–8.0)

## 2018-12-19 LAB — CBC WITH DIFFERENTIAL/PLATELET
Basophils Absolute: 0.1 10*3/uL (ref 0.0–0.1)
Basophils Relative: 0.9 % (ref 0.0–3.0)
Eosinophils Absolute: 0.4 10*3/uL (ref 0.0–0.7)
Eosinophils Relative: 5.8 % — ABNORMAL HIGH (ref 0.0–5.0)
HCT: 35.9 % — ABNORMAL LOW (ref 39.0–52.0)
Hemoglobin: 12.2 g/dL — ABNORMAL LOW (ref 13.0–17.0)
Lymphocytes Relative: 14.1 % (ref 12.0–46.0)
Lymphs Abs: 1 10*3/uL (ref 0.7–4.0)
MCHC: 34 g/dL (ref 30.0–36.0)
MCV: 89.7 fl (ref 78.0–100.0)
Monocytes Absolute: 0.6 10*3/uL (ref 0.1–1.0)
Monocytes Relative: 9 % (ref 3.0–12.0)
Neutro Abs: 4.8 10*3/uL (ref 1.4–7.7)
Neutrophils Relative %: 70.2 % (ref 43.0–77.0)
Platelets: 222 10*3/uL (ref 150.0–400.0)
RBC: 4.01 Mil/uL — ABNORMAL LOW (ref 4.22–5.81)
RDW: 15.3 % (ref 11.5–15.5)
WBC: 6.9 10*3/uL (ref 4.0–10.5)

## 2018-12-19 LAB — HEPATIC FUNCTION PANEL
ALT: 11 U/L (ref 0–53)
AST: 10 U/L (ref 0–37)
Albumin: 3.6 g/dL (ref 3.5–5.2)
Alkaline Phosphatase: 82 U/L (ref 39–117)
Bilirubin, Direct: 0.1 mg/dL (ref 0.0–0.3)
Total Bilirubin: 0.6 mg/dL (ref 0.2–1.2)
Total Protein: 6.5 g/dL (ref 6.0–8.3)

## 2018-12-19 LAB — MICROALBUMIN / CREATININE URINE RATIO
Creatinine,U: 110.9 mg/dL
Microalb Creat Ratio: 345.2 mg/g — ABNORMAL HIGH (ref 0.0–30.0)
Microalb, Ur: 383 mg/dL — ABNORMAL HIGH (ref 0.0–1.9)

## 2018-12-19 LAB — BASIC METABOLIC PANEL
BUN: 26 mg/dL — ABNORMAL HIGH (ref 6–23)
CO2: 26 mEq/L (ref 19–32)
Calcium: 9.1 mg/dL (ref 8.4–10.5)
Chloride: 105 mEq/L (ref 96–112)
Creatinine, Ser: 1.15 mg/dL (ref 0.40–1.50)
GFR: 64.02 mL/min (ref >60.00)
Glucose, Bld: 177 mg/dL — ABNORMAL HIGH (ref 70–99)
Potassium: 4.7 mEq/L (ref 3.5–5.1)
Sodium: 141 mEq/L (ref 135–145)

## 2018-12-19 LAB — PSA: PSA: 0.32 ng/mL (ref 0.10–4.00)

## 2018-12-19 LAB — LIPID PANEL
Cholesterol: 180 mg/dL (ref 0–200)
HDL: 49.6 mg/dL (ref >39.00)
NonHDL: 130.88
Total CHOL/HDL Ratio: 4
Triglycerides: 213 mg/dL — ABNORMAL HIGH (ref 0.0–149.0)
VLDL: 42.6 mg/dL — ABNORMAL HIGH (ref 0.0–40.0)

## 2018-12-19 LAB — TSH: TSH: 3.59 u[IU]/mL (ref 0.35–4.50)

## 2018-12-19 LAB — LDL CHOLESTEROL, DIRECT: Direct LDL: 83 mg/dL

## 2018-12-22 ENCOUNTER — Other Ambulatory Visit: Payer: Self-pay

## 2018-12-22 ENCOUNTER — Ambulatory Visit: Payer: PRIVATE HEALTH INSURANCE | Admitting: Internal Medicine

## 2018-12-22 ENCOUNTER — Encounter: Payer: Self-pay | Admitting: Internal Medicine

## 2018-12-22 VITALS — BP 148/80 | HR 69 | Temp 97.8°F | Ht 72.0 in | Wt 392.0 lb

## 2018-12-22 DIAGNOSIS — E119 Type 2 diabetes mellitus without complications: Secondary | ICD-10-CM

## 2018-12-22 DIAGNOSIS — H409 Unspecified glaucoma: Secondary | ICD-10-CM | POA: Insufficient documentation

## 2018-12-22 DIAGNOSIS — Z0000 Encounter for general adult medical examination without abnormal findings: Secondary | ICD-10-CM

## 2018-12-22 DIAGNOSIS — Z23 Encounter for immunization: Secondary | ICD-10-CM | POA: Diagnosis not present

## 2018-12-22 NOTE — Patient Instructions (Signed)
You had the Tdap tetanus shot today  Please continue all other medications as before, and refills have been done if requested.  Please have the pharmacy call with any other refills you may need.  Please continue your efforts at being more active, low cholesterol diet, and weight control.  You are otherwise up to date with prevention measures today.  Please keep your appointments with your specialists as you may have planned  You will be contacted regarding the referral for: eye doctor  Please return in 6 months, or sooner if needed, with Lab testing done 3-5 days before

## 2018-12-22 NOTE — Assessment & Plan Note (Signed)

## 2018-12-22 NOTE — Assessment & Plan Note (Signed)
For optho referral due for f/u

## 2018-12-22 NOTE — Assessment & Plan Note (Signed)
stable overall by history and exam, recent data reviewed with pt, and pt to continue medical treatment as before,  to f/u any worsening symptoms or concerns  

## 2018-12-22 NOTE — Progress Notes (Signed)
Subjective:    Patient ID: Gerald Day, male    DOB: 30-May-1956, 63 y.o.   MRN: [id number]  HPI  Here for wellness and f/u;  Overall doing ok;  Pt denies Chest pain, worsening SOB, DOE, wheezing, orthopnea, PND, worsening LE edema, palpitations, dizziness or syncope.  Pt denies neurological change such as new headache, facial or extremity weakness.  Pt denies polydipsia, polyuria, or low sugar symptoms. Pt states overall good compliance with treatment and medications, good tolerability, and has been trying to follow appropriate diet.  Pt denies worsening depressive symptoms, suicidal ideation or panic. No fever, night sweats, wt loss, loss of appetite, or other constitutional symptoms.  Pt states good ability with ADL's, has low fall risk, home safety reviewed and adequate, no other significant changes in hearing or vision, and only occasionally active with exercise. Still working dialy during pandemic.  Wife with cancer getting chemo, so is putting off the colonoscopy.  Due for eye doctor f/u, last seen about 2 yrs ago for glaucoma Past Medical History:  Diagnosis Date  . Arthritis    IN KNEES  . Cellulitis 10/26/2013   LOWER RT EXTREMITY  . CELLULITIS/ABSCESS, LEG 06/27/2007  . COLONIC POLYPS   . CONGESTIVE HEART FAILURE   . DIABETES MELLITUS, TYPE II   . DIVERTICULOSIS, COLON   . ERECTILE DYSFUNCTION, ORGANIC   . GOUT NOS   . Headache   . HYPERLIPIDEMIA   . HYPERTENSION   . LOW BACK PAIN   . Morbid obesity ([place])   . SLEEP APNEA, OBSTRUCTIVE    USES CPAP   Past Surgical History:  Procedure Laterality Date  . APPENDECTOMY    . FOOT SURGERY     LT  . TENOTOMY / FLEXOR TENDON TRANSFER Right 03/15/2015   Procedure: TENOTOMY HT REPAIR/ULCER DEBRIDEMENT/GRAFT PREP/ACELL GRAFT APPLICATION;  Surgeon: Jana Half, DPM;  Location: [facility];  Service: Podiatry;  Laterality: Right;  HALLUX    reports that he has never smoked. He has never used smokeless tobacco. He reports current  alcohol use of about 2.0 standard drinks of alcohol per week. He reports that he does not use drugs. family history includes Asthma in an other family member; Cardiomyopathy in his father. Allergies  Allergen Reactions  . Penicillins Other (See Comments)    Unknown childhood allergic reaction Has patient had a PCN reaction causing immediate rash, facial/tongue/throat swelling, SOB or lightheadedness with hypotension: Unknown Has patient had a PCN reaction causing severe rash involving mucus membranes or skin necrosis: Unknown Has patient had a PCN reaction that required hospitalization: No Has patient had a PCN reaction occurring within the last 10 years: No If all of the above answers are "NO", then may proceed with Cephalosporin use.    Current Outpatient Medications on File Prior to Visit  Medication Sig Dispense Refill  . amLODipine (NORVASC) 10 MG tablet TAKE 1 TABLET (10 MG TOTAL) BY MOUTH DAILY. 90 tablet 1  . atorvastatin (LIPITOR) 10 MG tablet TAKE 1 TABLET (10 MG TOTAL) BY MOUTH DAILY. 90 tablet 1  . cloNIDine (CATAPRES) 0.3 MG tablet Take 0.3 mg by mouth 3 (three) times daily.    . diclofenac sodium (VOLTAREN) 1 % GEL Apply 2 g topically 4 (four) times daily. (Patient taking differently: Apply 2 g topically 3 (three) times daily. ) 1 Tube 5  . furosemide (LASIX) 80 MG tablet TAKE 1 TABLET (80 MG TOTAL) BY MOUTH DAILY AS NEEDED. 90 tablet 1  . glipiZIDE (GLUCOTROL XL)  5 MG 24 hr tablet TAKE 1 TABLET BY MOUTH EVERY DAY WITH BREAKFAST 90 tablet 1  . indomethacin (INDOCIN) 50 MG capsule Take 1 capsule (50 mg total) by mouth 3 (three) times daily as needed. 90 capsule 1  . KLOR-CON M20 20 MEQ tablet TAKE 1 TABLET (20 MEQ TOTAL) BY MOUTH DAILY. 30 tablet 11  . metFORMIN (GLUCOPHAGE-XR) 500 MG 24 hr tablet TAKE 4 TABLETS BY MOUTH PER DAY 360 tablet 2  . sildenafil (VIAGRA) 100 MG tablet Take 0.5-1 tablets (50-100 mg total) by mouth daily as needed for erectile dysfunction. 5 tablet 11  .  telmisartan (MICARDIS) 80 MG tablet Take 1 tablet (80 mg total) by mouth daily. 90 tablet 3   No current facility-administered medications on file prior to visit.    Review of Systems Constitutional: Negative for other unusual diaphoresis, sweats, appetite or weight changes HENT: Negative for other worsening hearing loss, ear pain, facial swelling, mouth sores or neck stiffness.   Eyes: Negative for other worsening pain, redness or other visual disturbance.  Respiratory: Negative for other stridor or swelling Cardiovascular: Negative for other palpitations or other chest pain  Gastrointestinal: Negative for worsening diarrhea or loose stools, blood in stool, distention or other pain Genitourinary: Negative for hematuria, flank pain or other change in urine volume.  Musculoskeletal: Negative for myalgias or other joint swelling.  Skin: Negative for other color change, or other wound or worsening drainage.  Neurological: Negative for other syncope or numbness. Hematological: Negative for other adenopathy or swelling Psychiatric/Behavioral: Negative for hallucinations, other worsening agitation, SI, self-injury, or new decreased concentration All other system neg per pt    Objective:   Physical Exam BP (!) 148/80 (BP Location: Left Arm, Patient Position: Sitting, Cuff Size: Large)   Pulse 69   Temp 97.8 F (36.6 C) (Oral)   Ht 6' (1.829 m)   Wt (!) 392 lb (177.8 kg)   SpO2 97%   BMI 53.16 kg/m  VS noted,  Constitutional: Pt is oriented to person, place, and time. Appears well-developed and well-nourished, in no significant distress and comfortable Head: Normocephalic and atraumatic  Eyes: Conjunctivae and EOM are normal. Pupils are equal, round, and reactive to light Right Ear: External ear normal without discharge Left Ear: External ear normal without discharge Nose: Nose without discharge or deformity Mouth/Throat: Oropharynx is without other ulcerations and moist  Neck: Normal  range of motion. Neck supple. No JVD present. No tracheal deviation present or significant neck LA or mass Cardiovascular: Normal rate, regular rhythm, normal heart sounds and intact distal pulses.   Pulmonary/Chest: WOB normal and breath sounds without rales or wheezing  Abdominal: Soft. Bowel sounds are normal. NT. No HSM  Musculoskeletal: Normal range of motion. Exhibits no edema Lymphadenopathy: Has no other cervical adenopathy.  Neurological: Pt is alert and oriented to person, place, and time. Pt has normal reflexes. No cranial nerve deficit. Motor grossly intact, Gait intact Skin: Skin is warm and dry. No rash noted or new ulcerations Psychiatric:  Has normal mood and affect. Behavior is normal without agitation No other exam findings Lab Results  Component Value Date   WBC 6.9 12/19/2018   HGB 12.2 (L) 12/19/2018   HCT 35.9 (L) 12/19/2018   PLT 222.0 12/19/2018   GLUCOSE 177 (H) 12/19/2018   CHOL 180 12/19/2018   TRIG 213.0 (H) 12/19/2018   HDL 49.60 12/19/2018   LDLDIRECT 83.0 12/19/2018   LDLCALC 57 06/17/2018   ALT 11 12/19/2018   AST 10  12/19/2018   NA 141 12/19/2018   K 4.7 12/19/2018   CL 105 12/19/2018   CREATININE 1.15 12/19/2018   BUN 26 (H) 12/19/2018   CO2 26 12/19/2018   TSH 3.59 12/19/2018   PSA 0.32 12/19/2018   INR 1.07 02/25/2018   HGBA1C 6.9 (H) 12/19/2018   MICROALBUR 383.0 (H) 12/19/2018      Assessment & Plan:

## 2019-01-09 ENCOUNTER — Encounter: Payer: Self-pay | Admitting: Orthopaedic Surgery

## 2019-01-09 ENCOUNTER — Other Ambulatory Visit: Payer: Self-pay

## 2019-01-09 ENCOUNTER — Ambulatory Visit (INDEPENDENT_AMBULATORY_CARE_PROVIDER_SITE_OTHER): Payer: PRIVATE HEALTH INSURANCE | Admitting: Orthopaedic Surgery

## 2019-01-09 DIAGNOSIS — G8929 Other chronic pain: Secondary | ICD-10-CM

## 2019-01-09 DIAGNOSIS — M545 Low back pain, unspecified: Secondary | ICD-10-CM

## 2019-01-09 MED ORDER — METHYLPREDNISOLONE 4 MG PO TBPK: ORAL_TABLET | ORAL | 0 refills | Status: DC

## 2019-01-09 MED ORDER — CYCLOBENZAPRINE HCL 5 MG PO TABS: 5.0000 mg | ORAL_TABLET | Freq: Three times a day (TID) | ORAL | 0 refills | Status: DC | PRN

## 2019-01-09 NOTE — Progress Notes (Signed)
Office Visit Note   Patient: Gerald Day           Date of Birth: 03-21-1956           MRN: [id number] Visit Date: 01/09/2019              Requested by: Biagio Borg, MD [place], [place] [address] PCP: Biagio Borg, MD   Assessment & Plan: Visit Diagnoses:  1. Chronic midline low back pain without sciatica     Plan: Impression is lumbar pain.  We will start the patient on a steroid taper and muscle relaxer.  He will do home exercises as opposed to formal physical therapy.  We will also refer him to Dr. Ernestina Patches for an epidural steroid injection.  He will follow-up with [place] as needed.  Follow-Up Instructions: Return if symptoms worsen or fail to improve.   Orders:  Orders Placed This Encounter  Procedures  . Ambulatory referral to Physical Medicine Rehab   Meds ordered this encounter  Medications  . methylPREDNISolone (MEDROL DOSEPAK) 4 MG TBPK tablet    Sig: Take as directed    Dispense:  21 tablet    Refill:  0  . cyclobenzaprine (FLEXERIL) 5 MG tablet    Sig: Take 1 tablet (5 mg total) by mouth 3 (three) times daily as needed for muscle spasms.    Dispense:  30 tablet    Refill:  0      Procedures: No procedures performed   Clinical Data: No additional findings.   Subjective: Chief Complaint  Patient presents with  . Lower Back - Pain    HPI patient is a pleasant 63 year old gentleman who presents our clinic today with recurrent lower back pain.  This has been ongoing for the past several years.  Saw him in the office a little over a year ago for this.  He was started on a steroid taper and sent to formal physical therapy.  He notes that his pain improved following these measures.  This past Thursday while at work, he bent over to pick up something and he felt a pull in his lower back.  He has since had increased pain with standing and walking.  He gets some relief with sitting.  He has been taking an occasional Flexeril with moderate  relief of symptoms.  He denies any weakness to either lower extremity.  No numbness, tingling or burning.  No bowel or bladder change and no saddle paresthesias.  No previous ESI.  MRI from October 2018 showed a central disc protrusion pressing on the thecal sac at L3-4 as well as a mild broad-based disc bulge at L5-S1 with left-sided facet arthropathy and foraminal stenosis.  Review of Systems as detailed in HPI.  All others reviewed and are negative.   Objective: Vital Signs: There were no vitals taken for this visit.  Physical Exam well-developed well-nourished gentleman in no acute distress.  Alert and oriented x3.  Ortho Exam examination of his lower back reveals no spinous or paraspinous tenderness.  Negative straight leg raise both sides.  No pain with lumbar flexion or extension.  Full sensation distally.  Specialty Comments:  No specialty comments available.  Imaging: No new imaging   PMFS History: Patient Active Problem List   Diagnosis Date Noted  . Glaucoma 12/22/2018  . Radiculopathy, lumbar region 01/17/2018  . Constipation 06/18/2017  . AKI (acute kidney injury) ([place]) 06/02/2017  . Chronic midline low back pain without  sciatica 05/14/2017  . Proteinuria 11/22/2016  . Skin lesion 11/12/2015  . Preop exam for internal medicine 01/16/2015  . Cellulitis and abscess of leg 10/26/2013  . DM foot ulcer ([place]) 10/23/2013  . Preventative health care 07/05/2011  . History of colonic polyps 09/05/2009  . COLONIC POLYPS 03/14/2008  . DIVERTICULOSIS, COLON 03/14/2008  . Hyperlipidemia 06/27/2007  . CELLULITIS/ABSCESS, LEG 06/27/2007  . Diabetes ([place]) 05/21/2007  . GOUT NOS 05/21/2007  . Morbid obesity ([place]) 05/21/2007  . ERECTILE DYSFUNCTION 05/21/2007  . OSA (obstructive sleep apnea) 05/21/2007  . Essential hypertension 05/21/2007   Past Medical History:  Diagnosis Date  . Arthritis    IN KNEES  . Cellulitis 10/26/2013   LOWER RT EXTREMITY  . CELLULITIS/ABSCESS,  LEG 06/27/2007  . COLONIC POLYPS   . CONGESTIVE HEART FAILURE   . DIABETES MELLITUS, TYPE II   . DIVERTICULOSIS, COLON   . ERECTILE DYSFUNCTION, ORGANIC   . GOUT NOS   . Headache   . HYPERLIPIDEMIA   . HYPERTENSION   . LOW BACK PAIN   . Morbid obesity ([place])   . SLEEP APNEA, OBSTRUCTIVE    USES CPAP    Family History  Problem Relation Age of Onset  . Cardiomyopathy Father   . Asthma Other     Past Surgical History:  Procedure Laterality Date  . APPENDECTOMY    . FOOT SURGERY     LT  . TENOTOMY / FLEXOR TENDON TRANSFER Right 03/15/2015   Procedure: TENOTOMY HT REPAIR/ULCER DEBRIDEMENT/GRAFT PREP/ACELL GRAFT APPLICATION;  Surgeon: Jana Half, DPM;  Location: [facility];  Service: Podiatry;  Laterality: Right;  HALLUX   Social History   Occupational History  . Not on file  Tobacco Use  . Smoking status: Never Smoker  . Smokeless tobacco: Never Used  Substance and Sexual Activity  . Alcohol use: Yes    Alcohol/week: 2.0 standard drinks    Types: 2 Cans of beer per week    Comment: 6 beers a week  . Drug use: No  . Sexual activity: Not on file

## 2019-01-29 ENCOUNTER — Other Ambulatory Visit: Payer: Self-pay | Admitting: Internal Medicine

## 2019-02-14 ENCOUNTER — Telehealth: Payer: Self-pay | Admitting: Physical Medicine and Rehabilitation

## 2019-02-14 NOTE — Telephone Encounter (Signed)
Dr. Erlinda Hong notes pretty good, we may need to see Gerald Day and document again with attention to the denial letter, or patient can just contonue conservative care through Dr. Erlinda Hong

## 2019-02-15 NOTE — Telephone Encounter (Signed)
Scheduled for OV on 6/23.

## 2019-02-25 ENCOUNTER — Other Ambulatory Visit: Payer: Self-pay | Admitting: Internal Medicine

## 2019-02-27 ENCOUNTER — Other Ambulatory Visit: Payer: Self-pay | Admitting: Internal Medicine

## 2019-02-27 NOTE — Telephone Encounter (Signed)
Should this be changed?

## 2019-02-27 NOTE — Telephone Encounter (Signed)
Ok to let pt know-=   Thanks for your inquiry about the concern related to a possible cancer causing contaminant to the metformin ER.   We will simply need to change your metformin ER to the regular metformin as there has been no concern about this with the regular metformin.  We will send a new prescription, and simply start this when you are able instead.

## 2019-02-28 ENCOUNTER — Ambulatory Visit: Payer: PRIVATE HEALTH INSURANCE | Admitting: Physical Medicine and Rehabilitation

## 2019-02-28 NOTE — Telephone Encounter (Signed)
Patient's wife informed of below.

## 2019-03-04 ENCOUNTER — Other Ambulatory Visit: Payer: Self-pay | Admitting: Internal Medicine

## 2019-03-13 ENCOUNTER — Telehealth: Payer: Self-pay | Admitting: *Deleted

## 2019-03-13 ENCOUNTER — Ambulatory Visit: Payer: Self-pay | Admitting: *Deleted

## 2019-03-13 DIAGNOSIS — Z20822 Contact with and (suspected) exposure to covid-19: Secondary | ICD-10-CM

## 2019-03-13 IMAGING — DX DG LUMBAR SPINE 2-3V
3 series · 3 of 3 positions shown · non-contrast
Comparison: None.

CLINICAL DATA: Low back pain for several weeks, no known injury,
initial encounter

EXAM:
LUMBAR SPINE - 2-3 VIEW

[l-spine ap]
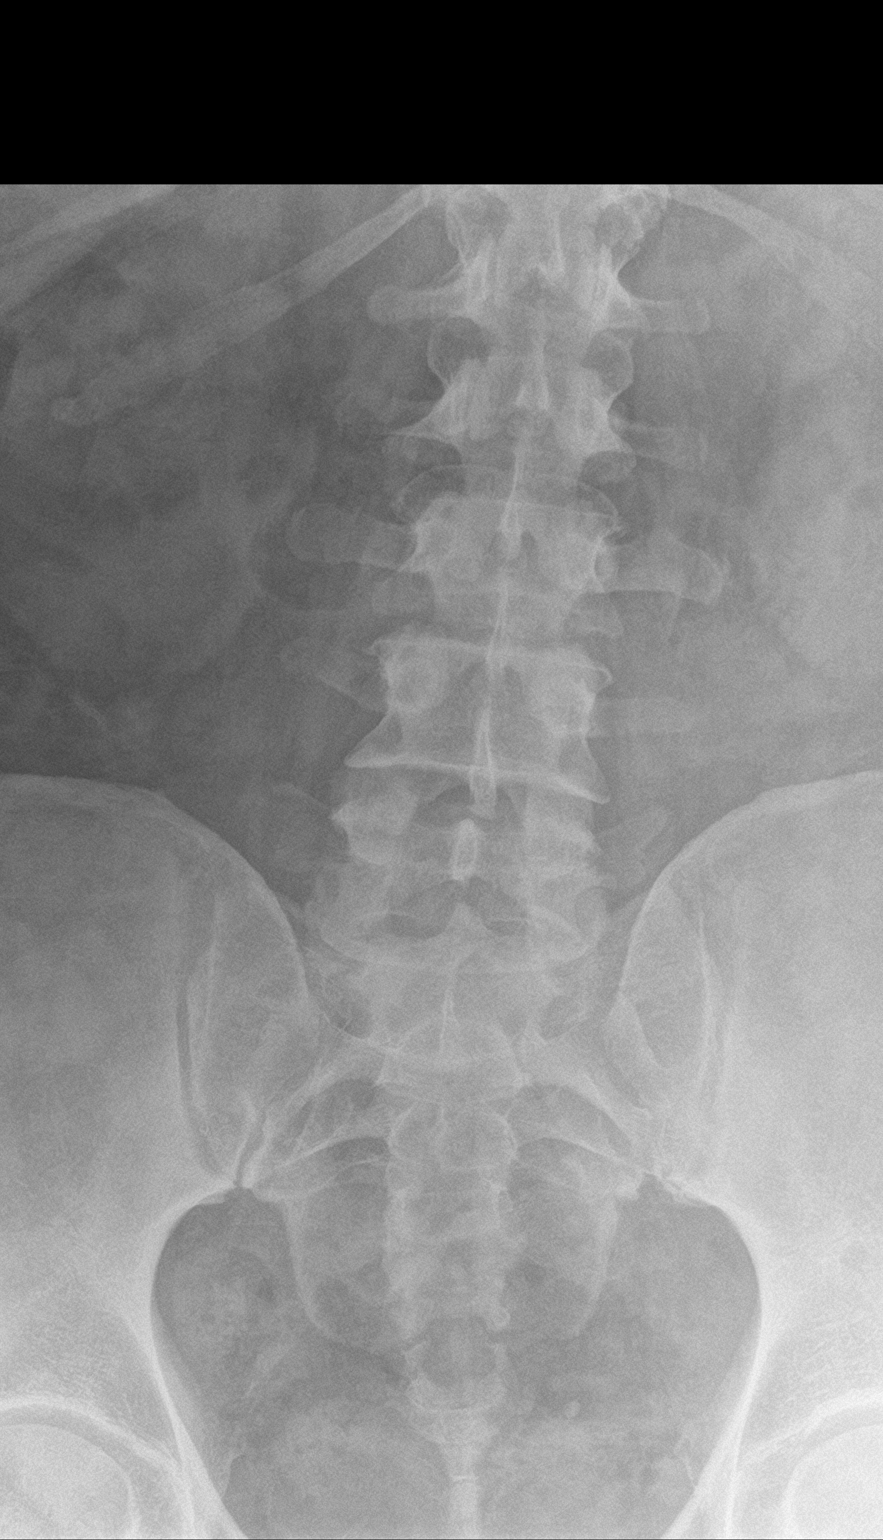

[l-spine lat]
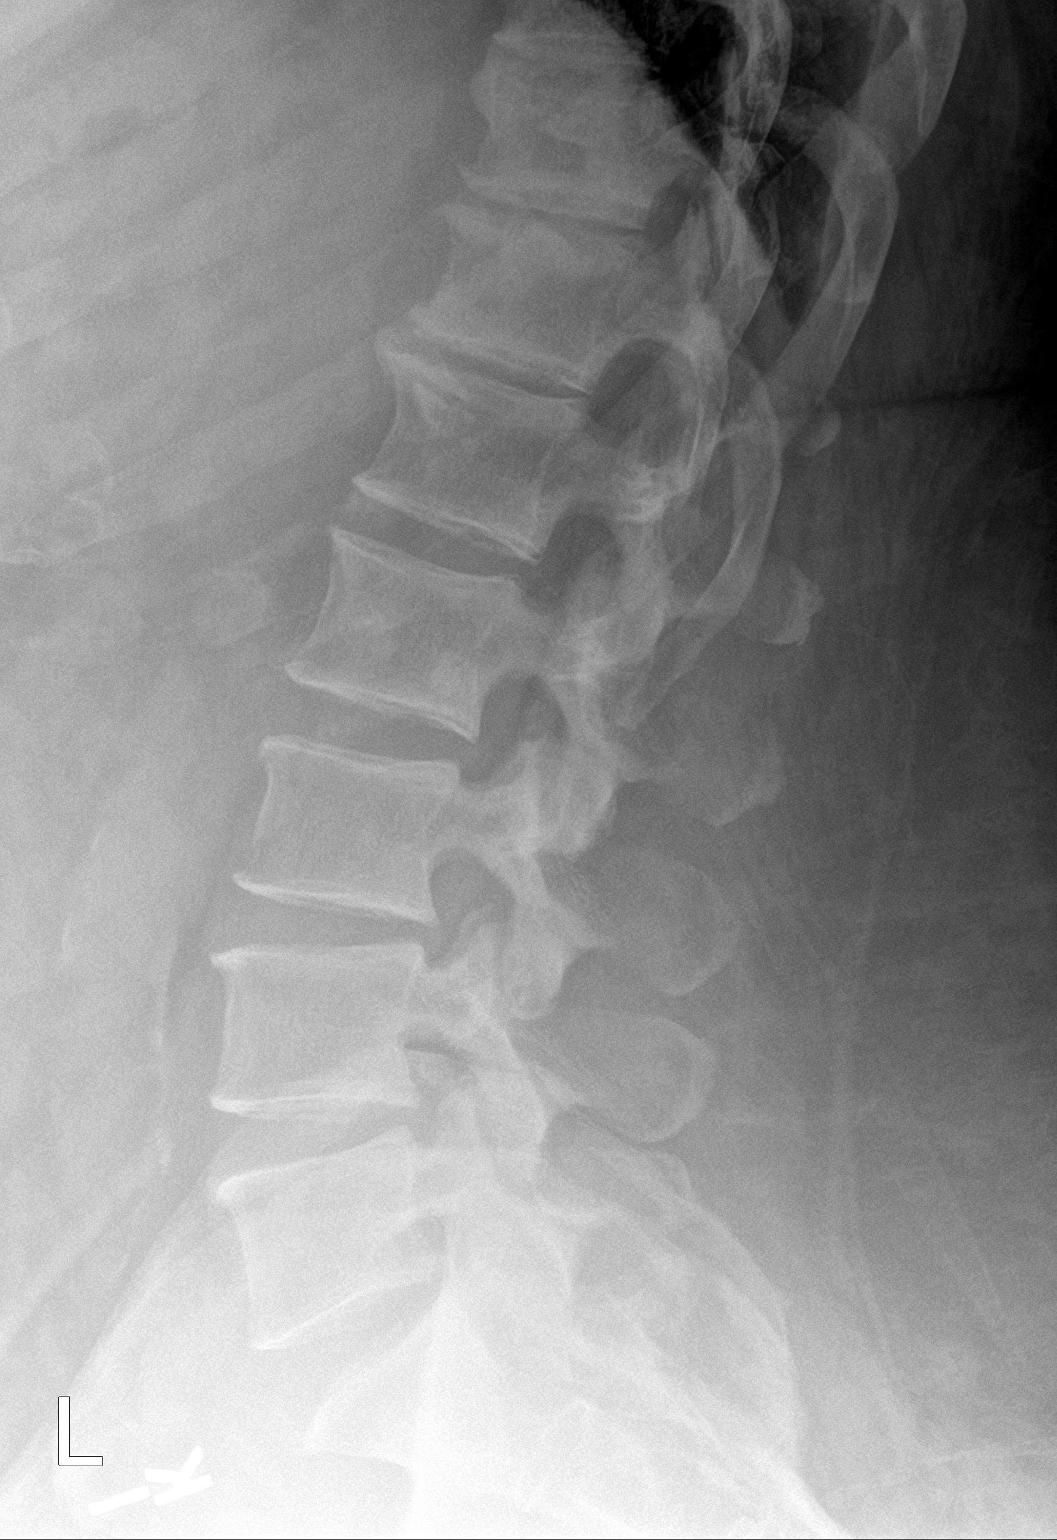

[l-spine spot]
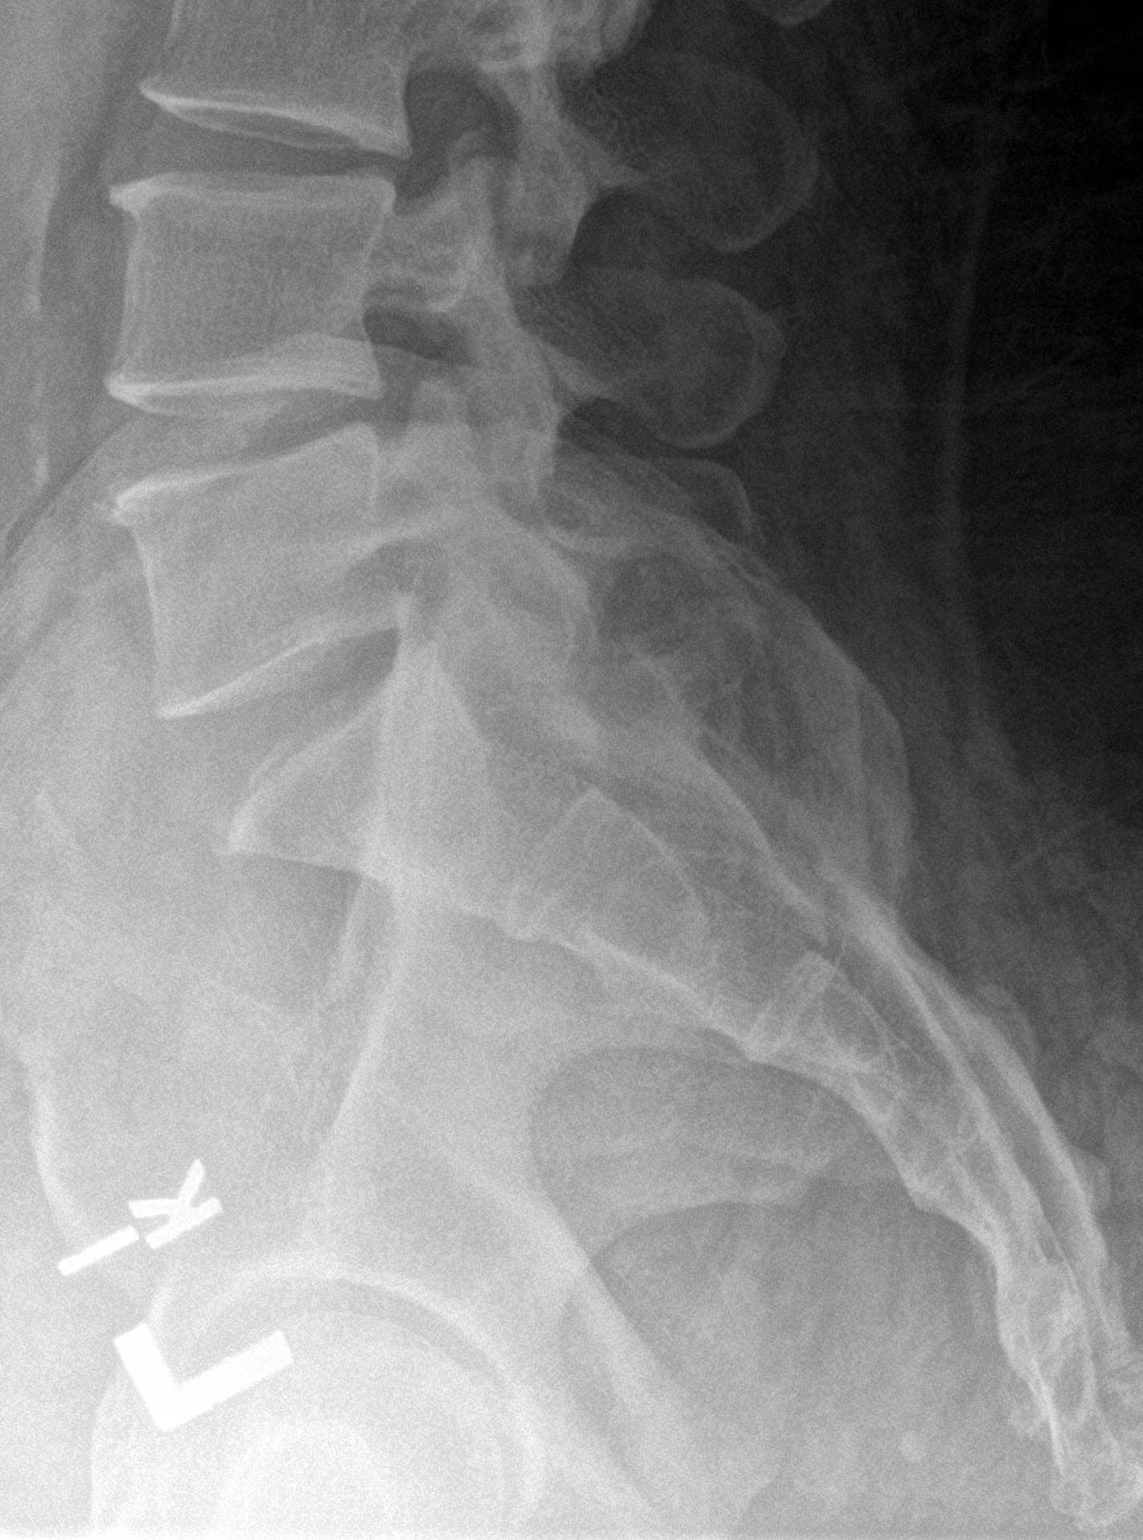

[3 of 3 positions shown; findings below may reference images not displayed]

FINDINGS: Five lumbar type vertebral bodies are well visualized. Vertebral
body height is well maintained. Mild osteophytic changes are noted
the lumbar spine as well as in the lower thoracic spine. No
anterolisthesis is seen. No soft tissue changes are noted.
IMPRESSION: Degenerative change without acute abnormality.

## 2019-03-13 NOTE — Telephone Encounter (Signed)
Pt called stating that he was exposed to someone who tested positive for COVID with the last exposure was 03/02/2019; he wears a mask and social distances at work; the pt is most concerned because his wife has cancer; recommendations made per nurse triage protocol; he verbalized understanding; his best contact number is his wife's cell phone [phone], and a message can be left on the voicemail; he sees Dr Cathlean Cower, [facility]; will route to office for final disposition.  Reason for Disposition . [1] COVID-19 EXPOSURE (Close Contact) AND [2] within last 14 days BUT [3] NO symptoms  Answer Assessment - Initial Assessment Questions 1. CLOSE CONTACT: "Who is the person with the confirmed or suspected COVID-19 infection that you were exposed to?"     Co-worker 2. PLACE of CONTACT: "Where were you when you were exposed to COVID-19?" (e.g., home, school, medical waiting room; which city?)     work 3. TYPE of CONTACT: "How much contact was there?" (e.g., sitting next to, live in same house, work in same office, same building)     Same building 4. DURATION of CONTACT: "How long were you in contact with the COVID-19 patient?" (e.g., a few seconds, passed by person, a few minutes, live with the patient)    Multiple 3 hour shifts together 5. DATE of CONTACT: "When did you have contact with a COVID-19 patient?" (e.g., how many days ago)     03/02/2019 6. TRAVEL: "Have you traveled out of the country recently?" If so, "When and where?"     * Also ask about out-of-state travel, since the CDC has identified some high-risk cities for community spread in the [place].     * Note: Travel becomes less relevant if there is widespread community transmission where the patient lives.     no 7. COMMUNITY SPREAD: "Are there lots of cases of COVID-19 (community spread) where you live?" (See public health department website, if unsure)       Major community spread 8. SYMPTOMS: "Do you have any symptoms?" (e.g., fever, cough,  breathing difficulty)     no 9. PREGNANCY OR POSTPARTUM: "Is there any chance you are pregnant?" "When was your last menstrual period?" "Did you deliver in the last 2 weeks?"     n/a 10. HIGH RISK: "Do you have any heart or lung problems? Do you have a weak immune system?" (e.g., CHF, COPD, asthma, HIV positive, chemotherapy, renal failure, diabetes mellitus, sickle cell anemia)       DM .HTN  Protocols used: CORONAVIRUS (COVID-19) EXPOSURE-A-AH

## 2019-03-13 NOTE — Telephone Encounter (Signed)
Pt scheduled for covid testing 03/14/19 @ 8:15 @ [facility]. Instructions given and order placed.

## 2019-03-13 NOTE — Telephone Encounter (Signed)
[facility] for covid testing referral

## 2019-03-13 NOTE — Telephone Encounter (Signed)
Please contact pt to schedule covid19 testing due to possible exposure. Thank you.

## 2019-03-14 ENCOUNTER — Other Ambulatory Visit: Payer: Self-pay

## 2019-03-14 DIAGNOSIS — Z20822 Contact with and (suspected) exposure to covid-19: Secondary | ICD-10-CM

## 2019-03-18 LAB — NOVEL CORONAVIRUS, NAA: SARS-CoV-2, NAA: NOT DETECTED

## 2019-03-22 ENCOUNTER — Ambulatory Visit (INDEPENDENT_AMBULATORY_CARE_PROVIDER_SITE_OTHER): Payer: PRIVATE HEALTH INSURANCE | Admitting: Physical Medicine and Rehabilitation

## 2019-03-22 ENCOUNTER — Other Ambulatory Visit: Payer: Self-pay

## 2019-03-22 ENCOUNTER — Encounter: Payer: Self-pay | Admitting: Physical Medicine and Rehabilitation

## 2019-03-22 VITALS — BP 146/66 | HR 73 | Ht 72.0 in | Wt 385.0 lb

## 2019-03-22 DIAGNOSIS — M5126 Other intervertebral disc displacement, lumbar region: Secondary | ICD-10-CM

## 2019-03-22 DIAGNOSIS — M545 Low back pain: Secondary | ICD-10-CM | POA: Diagnosis not present

## 2019-03-22 DIAGNOSIS — G8929 Other chronic pain: Secondary | ICD-10-CM

## 2019-03-22 DIAGNOSIS — M47816 Spondylosis without myelopathy or radiculopathy, lumbar region: Secondary | ICD-10-CM | POA: Diagnosis not present

## 2019-03-22 NOTE — Progress Notes (Signed)
 .  Numeric Pain Rating Scale and Functional Assessment Average Pain 8 Pain Right Now 1 My pain is intermittent, dull and aching Pain is worse with: some activites Pain improves with: medication   In the last MONTH (on 0-10 scale) has pain interfered with the following?  1. General activity like being  able to carry out your everyday physical activities such as walking, climbing stairs, carrying groceries, or moving a chair?  Rating(5)  2. Relation with others like being able to carry out your usual social activities and roles such as  activities at home, at work and in your community. Rating(5)  3. Enjoyment of life such that you have  been bothered by emotional problems such as feeling anxious, depressed or irritable?  Rating(0)

## 2019-03-23 ENCOUNTER — Telehealth: Payer: Self-pay | Admitting: *Deleted

## 2019-03-23 ENCOUNTER — Encounter: Payer: Self-pay | Admitting: Physical Medicine and Rehabilitation

## 2019-03-23 NOTE — Telephone Encounter (Signed)
Called pt insurance and spoke with Jenny Reichmann D and she states no pa is needed until after the first 3 treatments. Reference # [id number]

## 2019-03-23 NOTE — Progress Notes (Signed)
Gerald Day - 63 y.o. male MRN [id number]  Date of birth: August 26, 1956  Office Visit Note: Visit Date: 03/22/2019 PCP: Biagio Borg, MD Referred by: Biagio Borg, MD  Subjective: Chief Complaint  Patient presents with   Lower Back - Pain   HPI: Gerald Day is a 63 y.o. male who comes in today For evaluation and management of chronic worsening low back pain with some referral to the upper buttocks bilaterally.  He is seen today at the request of Dr. Eduard Roux who is been following for his knee pain and back pain.  Patient reports a chronic intermittent history of severe back pain off and on.  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  He is a very large individual and morbidly obese which is trying to lose weight.  I have seen the patient on one other occasion for knee injection with fluoroscopic guidance.  He reports to me that usually he will have a problem where he will have severe back pain and this is usually self limiting with his doing physical therapy exercises and some anti-inflammatory management.  He had one episode of pretty severe back pain in 2018.  This did not resolve right away despite the use of prednisone and muscle relaxers and he ultimately went through another course of full physical therapy which those notes are in the chart.  He did ultimately have an MRI of the lumbar spine which is reviewed below with the patient.  He did not have any interventional spine procedure at that time and has had no lumbar spine surgery.  He reports current pain started 4 months ago when he was moving an engine part and felt a twinge in his back and in the next day it was pretty severe pain.  He states that he was out of work for about a week and he just could not do anything.  Again no leg pain but back pain to the buttock region.  This was worse with movement and first thing in the morning and trying to get up.  Walking was not really limited.  He  reported his pain at that time is 10 out of 10.  With a course of prednisone and exercises from physical therapy this did diminish somewhat but was still significant.  He rates his average pain as 8 out of 10.  He has gone back to work but he still having a problem doing a lot of things he like to do and it really is limiting.  He reports the pain is intermittent dull and aching worse first thing in the morning worse going from sit to stand and with standing.  Better at rest and certain positions.  He has been doing physical therapy exercises daily.  He has not had any bowel or bladder issues no red flag complaints of fevers chills or night sweats or unexplained weight loss.  He is trying to lose weight.  Dr. Erlinda Hong did obtain x-rays of the lumbar spine which were really unrevealing and unchanged from a few years ago.  Again no specific trauma.  Review of Systems  Constitutional: Negative for chills, fever, malaise/fatigue and weight loss.  HENT: Negative for hearing loss and sinus pain.   Eyes: Negative for blurred vision, double vision and photophobia.  Respiratory: Negative for cough and shortness of breath.   Cardiovascular: Negative for chest pain, palpitations and leg swelling.  Gastrointestinal: Negative for abdominal pain, nausea  and vomiting.  Genitourinary: Negative for flank pain.  Musculoskeletal: Positive for back pain and joint pain. Negative for myalgias.  Skin: Negative for itching and rash.  Neurological: Negative for tremors, focal weakness and weakness.  Endo/Heme/Allergies: Negative.   Psychiatric/Behavioral: Negative for depression.  All other systems reviewed and are negative.  Otherwise per HPI.  Assessment & Plan: Visit Diagnoses:  1. Chronic bilateral low back pain without sciatica   2. Spondylosis without myelopathy or radiculopathy, lumbar region   3. Other intervertebral disc displacement, lumbar region   4. Morbid obesity ([place])     Plan: Findings:  Chronic history  of intermittent severe back pain which is a fairly common occurrence in the [place] population.  Patient's back is exacerbated by morbid obesity.  He has tried to lose weight and has been somewhat successful he is try to do this for his knees as well.  This current episode of back pain started 4 months ago with movement type strain activity and just has not been relieved with normal conservative care.  His normal conservative care has included activity modification prednisone and anti-inflammatory and muscle spasm medication and physical therapy exercises.  He had a prior episode of pain in 2018 that did resolve after quite some time with care.  At this point he really is limited in what he can do although he is trying to work.  I think it is reasonable and justifiable to complete a lumbar epidural injection to see if we get him some benefit diagnostically and therapeutically and try to further along the process with his current regimen of exercises.  Depending on relief diagnostically I would consider facet joint blocks.  He would be a difficult case for ablation of the joints given his size but it is a consideration.  He would benefit from comprehensive management of his back pain to include therapy and medications and alternative treatments as well as interventional spine treatment.  Nothing on the MRI or in his complaints or history would suggest surgical issue.  MRI shows central disc protrusion at L3-4 without significant stenosis and there is some facet arthropathy at that level and L4-5.  L4-5 also has broad origin.  Plan would be for L4-5 interlaminar epidural steroid injection.  This would be done with fluoroscopic guidance as part of a comprehensive management approach.    Meds & Orders: No orders of the defined types were placed in this encounter.  No orders of the defined types were placed in this encounter.   Follow-up: Return for L4-5 interlaminar for steroid injection..   Procedures: No  procedures performed  No notes on file   Clinical History: MRI LUMBAR SPINE WITHOUT CONTRAST  TECHNIQUE: Multiplanar, multisequence MR imaging of the lumbar spine was performed. No intravenous contrast was administered.  COMPARISON:  None.  FINDINGS: Segmentation:  Standard.  Alignment:  Physiologic.  Vertebrae:  No fracture, evidence of discitis, or bone lesion.  Conus medullaris: Extends to the L1 level and appears normal.  Paraspinal and other soft tissues: No paraspinal abnormality.  Disc levels:  Disc spaces: Disc desiccation at L3-4, L4-5 and L5-S1.  T12-L1: No significant disc bulge. No evidence of neural foraminal stenosis. No central canal stenosis.  L1-L2: No significant disc bulge. No evidence of neural foraminal stenosis. No central canal stenosis.  L2-L3: No significant disc bulge. Mild left facet arthropathy. No evidence of neural foraminal stenosis. No central canal stenosis.  L3-L4: Central disc protrusion impressing on the thecal sac. No evidence of neural foraminal  stenosis. No central canal stenosis.  L4-L5: Mild broad-based disc bulge. Right facet arthropathy with a small effusion. No evidence of neural foraminal stenosis. No central canal stenosis.  L5-S1: Mild broad-based disc bulge. Mild left facet arthropathy with mild left foraminal stenosis. No central canal stenosis.  IMPRESSION: 1. At L3-4 there is a central disc protrusion impressing on the thecal sac. 2. At L5-S1 there is a mild broad-based disc bulge. Mild left facet arthropathy with mild left foraminal stenosis.   Electronically Signed   By: Kathreen Devoid   On: 06/26/2017 09:37   He reports that he has never smoked. He has never used smokeless tobacco.  Recent Labs    06/17/18 0727 12/19/18 0728  HGBA1C 6.5 6.9*    Objective:  VS:  HT:6' (182.9 cm)    WT:(!) 385 lb (174.6 kg)   BMI:52.2     BP:(!) 146/66   HR:73bpm   TEMP: ( )   RESP:  Physical  Exam Vitals signs and nursing note reviewed.  Constitutional:      General: He is not in acute distress.    Appearance: He is well-developed. He is obese.  HENT:     Head: Normocephalic and atraumatic.     Nose: Nose normal.     Mouth/Throat:     Mouth: Mucous membranes are moist.     Pharynx: Oropharynx is clear.  Eyes:     Conjunctiva/sclera: Conjunctivae normal.     Pupils: Pupils are equal, round, and reactive to light.  Neck:     Musculoskeletal: Normal range of motion and neck supple.     Trachea: No tracheal deviation.  Cardiovascular:     Rate and Rhythm: Normal rate and regular rhythm.     Pulses: Normal pulses.  Pulmonary:     Effort: Pulmonary effort is normal.     Breath sounds: Normal breath sounds.  Abdominal:     General: There is no distension.     Palpations: Abdomen is soft.     Tenderness: There is no guarding or rebound.  Musculoskeletal:        General: No deformity.     Right lower leg: No edema.     Left lower leg: No edema.     Comments: Patient ambulates without aid and he is slow to arise from a seated position to full extension with some pain with facet loading.  No myofascial trigger points noted although he is somewhat tender over the lumbar spine.  No pain over the greater trochanters with hip rotation.  He has good distal strength without clonus.  Skin:    General: Skin is warm and dry.     Findings: No erythema or rash.  Neurological:     General: No focal deficit present.     Mental Status: He is alert and oriented to person, place, and time.     Motor: No abnormal muscle tone.     Coordination: Coordination normal.     Gait: Gait normal.  Psychiatric:        Mood and Affect: Mood normal.        Behavior: Behavior normal.        Thought Content: Thought content normal.     Ortho Exam Imaging: No results found.  Past Medical/Family/Surgical/Social History: Medications & Allergies reviewed per EMR, new medications updated. Patient  Active Problem List   Diagnosis Date Noted   Glaucoma 12/22/2018   Radiculopathy, lumbar region 01/17/2018   Constipation 06/18/2017   AKI (acute  kidney injury) ([place]) 06/02/2017   Chronic midline low back pain without sciatica 05/14/2017   Proteinuria 11/22/2016   Skin lesion 11/12/2015   Preop exam for internal medicine 01/16/2015   Cellulitis and abscess of leg 10/26/2013   DM foot ulcer ([place]) 10/23/2013   Preventative health care 07/05/2011   History of colonic polyps 09/05/2009   COLONIC POLYPS 03/14/2008   DIVERTICULOSIS, COLON 03/14/2008   Hyperlipidemia 06/27/2007   CELLULITIS/ABSCESS, LEG 06/27/2007   Diabetes ([place]) 05/21/2007   GOUT NOS 05/21/2007   Morbid obesity ([place]) 05/21/2007   ERECTILE DYSFUNCTION 05/21/2007   OSA (obstructive sleep apnea) 05/21/2007   Essential hypertension 05/21/2007   Past Medical History:  Diagnosis Date   Arthritis    IN KNEES   Cellulitis 10/26/2013   LOWER RT EXTREMITY   CELLULITIS/ABSCESS, LEG 06/27/2007   COLONIC POLYPS    CONGESTIVE HEART FAILURE    DIABETES MELLITUS, TYPE II    DIVERTICULOSIS, COLON    ERECTILE DYSFUNCTION, ORGANIC    GOUT NOS    Headache    HYPERLIPIDEMIA    HYPERTENSION    LOW BACK PAIN    Morbid obesity ([place])    SLEEP APNEA, OBSTRUCTIVE    USES CPAP   Family History  Problem Relation Age of Onset   Cardiomyopathy Father    Asthma Other    Past Surgical History:  Procedure Laterality Date   APPENDECTOMY     FOOT SURGERY     LT   TENOTOMY / FLEXOR TENDON TRANSFER Right 03/15/2015   Procedure: TENOTOMY HT REPAIR/ULCER DEBRIDEMENT/GRAFT PREP/ACELL GRAFT APPLICATION;  Surgeon: Jana Half, DPM;  Location: [facility];  Service: Podiatry;  Laterality: Right;  HALLUX   Social History   Occupational History   Not on file  Tobacco Use   Smoking status: Never Smoker   Smokeless tobacco: Never Used  Substance and Sexual Activity   Alcohol use: Yes     Alcohol/week: 2.0 standard drinks    Types: 2 Cans of beer per week    Comment: 6 beers a week   Drug use: No   Sexual activity: Not on file

## 2019-03-23 NOTE — Telephone Encounter (Signed)
Called pt and vm was not set up

## 2019-03-23 NOTE — Telephone Encounter (Signed)
-----   Message from Magnus Sinning, MD sent at 03/23/2019  6:48 AM EDT ----- See if you can get pre=auth

## 2019-03-29 ENCOUNTER — Other Ambulatory Visit: Payer: Self-pay | Admitting: Internal Medicine

## 2019-04-24 ENCOUNTER — Other Ambulatory Visit: Payer: Self-pay | Admitting: Internal Medicine

## 2019-04-26 ENCOUNTER — Other Ambulatory Visit: Payer: Self-pay | Admitting: Internal Medicine

## 2019-05-11 ENCOUNTER — Telehealth: Payer: Self-pay | Admitting: Internal Medicine

## 2019-05-11 NOTE — Telephone Encounter (Signed)
Called pt, LVM.   

## 2019-05-11 NOTE — Telephone Encounter (Signed)
Pt stated his Metformin has been recalled ans would like to know what Dr. Jenny Reichmann wants him to do. Requesting CB. Please advise. CB#[phone]

## 2019-05-11 NOTE — Telephone Encounter (Signed)
Pt stated his Metformin has been recalled ans would like to know what Dr. Jenny Reichmann wants him to do. Requesting CB. Please advise. CB#[phone]

## 2019-05-11 NOTE — Telephone Encounter (Addendum)
Thanks for your inquiry about the concern related to a possible cancer causing contaminant to the metformin ER.   Since you are not takinig metformin ER (instead you are taking the "regular" metformin) then there is no need for any changes.

## 2019-05-12 ENCOUNTER — Ambulatory Visit
Admission: EM | Admit: 2019-05-12 | Discharge: 2019-05-12 | Disposition: A | Discharge status: Home / Self Care | Payer: PRIVATE HEALTH INSURANCE | Attending: Physician Assistant | Admitting: Physician Assistant

## 2019-05-12 ENCOUNTER — Other Ambulatory Visit: Payer: Self-pay

## 2019-05-12 DIAGNOSIS — M25522 Pain in left elbow: Secondary | ICD-10-CM | POA: Diagnosis not present

## 2019-05-12 MED ORDER — DOXYCYCLINE HYCLATE 100 MG PO CAPS: 100.0000 mg | ORAL_CAPSULE | Freq: Two times a day (BID) | ORAL | 0 refills | Status: AC

## 2019-05-12 NOTE — Discharge Instructions (Addendum)
Take indomethicin as directed per your PCP. If no improvement in pain/redness after 2 days or worsening of symptoms, start doxycycline for possible skin infection. If no improvement or worsening of symptoms after 2 days of antibiotics, follow up with PCP or here for further evaluation. If unable to move elbow, go to ED for further evaluation.

## 2019-05-12 NOTE — ED Notes (Signed)
Patient able to ambulate independently  

## 2019-05-12 NOTE — ED Provider Notes (Signed)
[facility]    CSN: [id number] Arrival date & time: 05/12/19  [id number]      History   Chief Complaint Chief Complaint  Patient presents with   left elbow pain    HPI Gerald Day is a 63 y.o. male.   62 y.o male with history of non-insulin dependent DM, HTN, HLD, and gout present with left elbow pain x 2 days. He denies any injury to his elbow. He reports a constant ache with intermittent sharp pains with movement. Does notice some redness to the area. He has not tried any remedies at home. He denies numbness, tingling. Denies spreading erythema, fever. Denies radiation of pain, SOB, or chest pain. Work requires repetitive motion.     Past Medical History:  Diagnosis Date   Arthritis    IN KNEES   Cellulitis 10/26/2013   LOWER RT EXTREMITY   CELLULITIS/ABSCESS, LEG 06/27/2007   COLONIC POLYPS    CONGESTIVE HEART FAILURE    DIABETES MELLITUS, TYPE II    DIVERTICULOSIS, COLON    ERECTILE DYSFUNCTION, ORGANIC    GOUT NOS    Headache    HYPERLIPIDEMIA    HYPERTENSION    LOW BACK PAIN    Morbid obesity ([place])    SLEEP APNEA, OBSTRUCTIVE    USES CPAP    Patient Active Problem List   Diagnosis Date Noted   Glaucoma 12/22/2018   Radiculopathy, lumbar region 01/17/2018   Constipation 06/18/2017   AKI (acute kidney injury) ([place]) 06/02/2017   Chronic midline low back pain without sciatica 05/14/2017   Proteinuria 11/22/2016   Skin lesion 11/12/2015   Preop exam for internal medicine 01/16/2015   Cellulitis and abscess of leg 10/26/2013   DM foot ulcer ([place]) 10/23/2013   Preventative health care 07/05/2011   History of colonic polyps 09/05/2009   COLONIC POLYPS 03/14/2008   DIVERTICULOSIS, COLON 03/14/2008   Hyperlipidemia 06/27/2007   CELLULITIS/ABSCESS, LEG 06/27/2007   Diabetes ([place]) 05/21/2007   GOUT NOS 05/21/2007   Morbid obesity ([place]) 05/21/2007   ERECTILE DYSFUNCTION 05/21/2007   OSA (obstructive sleep  apnea) 05/21/2007   Essential hypertension 05/21/2007    Past Surgical History:  Procedure Laterality Date   APPENDECTOMY     FOOT SURGERY     LT   TENOTOMY / FLEXOR TENDON TRANSFER Right 03/15/2015   Procedure: TENOTOMY HT REPAIR/ULCER DEBRIDEMENT/GRAFT PREP/ACELL GRAFT APPLICATION;  Surgeon: Jana Half, DPM;  Location: [facility];  Service: Podiatry;  Laterality: Right;  HALLUX       Home Medications    Prior to Admission medications   Medication Sig Start Date End Date Taking? Authorizing Provider  amLODipine (NORVASC) 10 MG tablet TAKE 1 TABLET (10 MG TOTAL) BY MOUTH DAILY. 01/31/19   Biagio Borg, MD  atorvastatin (LIPITOR) 10 MG tablet TAKE 1 TABLET (10 MG TOTAL) BY MOUTH DAILY. 02/27/19   Biagio Borg, MD  cloNIDine (CATAPRES) 0.3 MG tablet Take 0.3 mg by mouth 3 (three) times daily.    [provider]  cyclobenzaprine (FLEXERIL) 5 MG tablet Take 1 tablet (5 mg total) by mouth 3 (three) times daily as needed for muscle spasms. 01/09/19   Aundra Dubin, PA-C  diclofenac sodium (VOLTAREN) 1 % GEL Apply 2 g topically 4 (four) times daily. Patient taking differently: Apply 2 g topically 3 (three) times daily.  10/11/17   Leandrew Koyanagi, MD  doxycycline (VIBRAMYCIN) 100 MG capsule Take 1 capsule (100 mg total) by mouth 2 (two) times daily  for 7 days. 05/12/19 05/19/19  Ok Edwards, PA-C  furosemide (LASIX) 80 MG tablet TAKE 1 TABLET (80 MG TOTAL) BY MOUTH DAILY AS NEEDED. 04/26/19   Biagio Borg, MD  glipiZIDE (GLUCOTROL XL) 5 MG 24 hr tablet TAKE 1 TABLET BY MOUTH EVERY DAY WITH BREAKFAST 10/13/18   Biagio Borg, MD  indomethacin (INDOCIN) 50 MG capsule Take 1 capsule (50 mg total) by mouth 3 (three) times daily as needed. 10/24/18   Biagio Borg, MD  KLOR-CON M20 20 MEQ tablet TAKE 1 TABLET (20 MEQ TOTAL) BY MOUTH DAILY. 04/24/19   Biagio Borg, MD  metFORMIN (GLUCOPHAGE) 500 MG tablet Take 2 tablets (1,000 mg total) by mouth 2 (two) times daily with a meal. 02/27/19   Biagio Borg, MD  methylPREDNISolone (MEDROL DOSEPAK) 4 MG TBPK tablet Take as directed 01/09/19   Aundra Dubin, PA-C  sildenafil (VIAGRA) 100 MG tablet Take 0.5-1 tablets (50-100 mg total) by mouth daily as needed for erectile dysfunction. 12/17/17   Biagio Borg, MD  telmisartan (MICARDIS) 80 MG tablet Take 1 tablet (80 mg total) by mouth daily. 10/24/18   Biagio Borg, MD    Family History Family History  Problem Relation Age of Onset   Cardiomyopathy Father    Asthma Other     Social History Social History   Tobacco Use   Smoking status: Never Smoker   Smokeless tobacco: Never Used  Substance Use Topics   Alcohol use: Not Currently    Alcohol/week: 2.0 standard drinks    Types: 2 Cans of beer per week    Comment: pt stopped drinking 1 week ago   Drug use: No     Allergies   Penicillins   Review of Systems Review of Systems  See HPI.    Physical Exam Triage Vital Signs ED Triage Vitals [05/12/19 1002]  Enc Vitals Group     BP (!) 168/69     Pulse      Resp 18     Temp 98.7 F (37.1 C)     Temp Source Oral     SpO2 96 %     Weight      Height      Head Circumference      Peak Flow      Pain Score 8     Pain Loc      Pain Edu?      Excl. in [place]?    No data found.  Updated Vital Signs BP (!) 168/69 (BP Location: Right Arm)    Temp 98.7 F (37.1 C) (Oral)    Resp 18    SpO2 96%   Physical Exam Constitutional:      General: He is not in acute distress.    Appearance: Normal appearance. He is not ill-appearing, toxic-appearing or diaphoretic.  HENT:     Head: Normocephalic and atraumatic.  Cardiovascular:     Rate and Rhythm: Normal rate and regular rhythm.     Heart sounds: Normal heart sounds.  Pulmonary:     Effort: Pulmonary effort is normal.     Breath sounds: Normal breath sounds.  Musculoskeletal:     Comments: Swelling and erythema to the left elbow, approx 10cm x 8cm surrounding the olecranon. Warmth to erythematous area. No  contusions noted. No fluctuance felt. No obvious induration. Tenderness to palpation and light touch at olecranon. No creptius noted. Full ROM and strength. Sensation intact. Radial pulse 2+.  Skin:    General: Skin is warm and dry.     Capillary Refill: Capillary refill takes 2 to 3 seconds.  Neurological:     Mental Status: He is alert.  Psychiatric:        Mood and Affect: Mood normal.        Behavior: Behavior normal.      UC Treatments / Results  Labs (all labs ordered are listed, but only abnormal results are displayed) Labs Reviewed - No data to display  EKG   Radiology No results found.  Procedures Procedures (including critical care time)  Medications Ordered in UC Medications - No data to display  Initial Impression / Assessment and Plan / UC Course  I have reviewed the triage vital signs and the nursing notes.  Pertinent labs & imaging results that were available during my care of the patient were reviewed by me and considered in my medical decision making (see chart for details).     Patient with Full ROM of elbow, no fever, low suspicion for septic joint at this time. ? Inflammation/gout vs cellulitis. Given 2 day history of pain to light touch, more likely due to gout at this time. Patient to start indomethacin as per PCP Rx. Rx of doxycycline called into pharmacy, patient to start if symptoms worsening or does not improve in 2 days to cover for cellulitis. Return precautions given.  Patient verbalizes and agrees with pain.   Final Clinical Impressions(s) / UC Diagnoses   Final diagnoses:  Left elbow pain   ED Prescriptions    Medication Sig Dispense Auth. Provider   doxycycline (VIBRAMYCIN) 100 MG capsule Take 1 capsule (100 mg total) by mouth 2 (two) times daily for 7 days. 14 capsule Tobin Chad, [place] 05/12/19 1116

## 2019-05-12 NOTE — ED Triage Notes (Signed)
Pt presents to UC w/ c/o left elbow pain since 3 days. Pt denies pain in chest or sob. Pain does not radiate. Pt denies taking any OTC pain meds for pain or hitting elbow.

## 2019-05-26 ENCOUNTER — Ambulatory Visit (INDEPENDENT_AMBULATORY_CARE_PROVIDER_SITE_OTHER): Payer: PRIVATE HEALTH INSURANCE

## 2019-05-26 ENCOUNTER — Other Ambulatory Visit: Payer: Self-pay

## 2019-05-26 ENCOUNTER — Telehealth: Payer: Self-pay

## 2019-05-26 ENCOUNTER — Ambulatory Visit: Payer: Self-pay

## 2019-05-26 ENCOUNTER — Ambulatory Visit (INDEPENDENT_AMBULATORY_CARE_PROVIDER_SITE_OTHER): Payer: PRIVATE HEALTH INSURANCE | Admitting: Orthopaedic Surgery

## 2019-05-26 ENCOUNTER — Encounter: Payer: Self-pay | Admitting: Orthopaedic Surgery

## 2019-05-26 VITALS — Ht 72.0 in | Wt 380.0 lb

## 2019-05-26 DIAGNOSIS — Z6841 Body Mass Index (BMI) 40.0 and over, adult: Secondary | ICD-10-CM | POA: Diagnosis not present

## 2019-05-26 DIAGNOSIS — M25562 Pain in left knee: Secondary | ICD-10-CM

## 2019-05-26 DIAGNOSIS — M25561 Pain in right knee: Secondary | ICD-10-CM

## 2019-05-26 DIAGNOSIS — G8929 Other chronic pain: Secondary | ICD-10-CM

## 2019-05-26 NOTE — Progress Notes (Signed)
Office Visit Note   Patient: Gerald Day           Date of Birth: 1956-08-05           MRN: [id number] Visit Date: 05/26/2019              Requested by: Biagio Borg, MD [place],  [place] [id number] PCP: Biagio Borg, MD   Assessment & Plan: Visit Diagnoses:  1. Chronic pain of both knees   2. Body mass index 50.0-59.9, adult ([place])   3. Morbid obesity ([place])     Plan: End-stage generative joint disease both knees.  We will submit for approval for Visco supplementation injection refer the patient to Dr. Ernestina Patches once approved.  He will make all efforts at weight loss as definitive treatment is bilateral sequential total knee replacements.  He will call with concerns or questions the meantime.  Follow-up with [place] as needed.  The patient meets the AMA guidelines for Morbid (severe) obesity with a BMI > 40.0 and I have recommended weight loss.  Follow-Up Instructions: Return for with Dr. Ernestina Patches once approved for bilateral knee visco injection.   Orders:  Orders Placed This Encounter  Procedures  . XR KNEE 3 VIEW LEFT  . XR KNEE 3 VIEW RIGHT   No orders of the defined types were placed in this encounter.     Procedures: No procedures performed   Clinical Data: No additional findings.   Subjective: Chief Complaint  Patient presents with  . Right Knee - Pain  . Left Knee - Pain    HPI patient is a pleasant 63 year old morbidly obese gentleman who comes in today with recurrent bilateral knee pain left greater than right.  History of end-stage degenerative joint disease both knees.  The pain he has is primarily to the lateral aspect both sides.  He notes intermittent aches that are worse at the end of the day.  He is tried ice and Celebrex without significant relief of symptoms.  He was last seen by [place] in February 2019.  We referred him to Dr. Ernestina Patches for bilateral cortisone injections which he had on 10/27/2017.  He had minimal relief of symptoms then was  approved for bilateral Monovisc injections which he received by Dr. Ernestina Patches on 12/02/2017.  He had relief of symptoms until recently.  Review of Systems as detailed in HPI.  All others reviewed and are negative.   Objective: Vital Signs: Ht 6' (1.829 m)   Wt (!) 380 lb (172.4 kg)   BMI 51.54 kg/m   Physical Exam well-developed well-nourished gentleman no acute distress.  Alert and oriented x3.  Ortho Exam examination of both knees reveals exogenous obesity.  Lateral joint line tenderness both sides.  Range of motion 0 to 90 degrees.  Specialty Comments:  No specialty comments available.  Imaging: Xr Knee 3 View Left  Result Date: 05/26/2019 Marked tricompartmental degenerative changes worse in the medial and patellofemoral compartments  Xr Knee 3 View Right  Result Date: 05/26/2019 Marked tricompartmental degenerative changes worse in the medial and patellofemoral compartments     PMFS History: Patient Active Problem List   Diagnosis Date Noted  . Glaucoma 12/22/2018  . Radiculopathy, lumbar region 01/17/2018  . Constipation 06/18/2017  . AKI (acute kidney injury) ([place]) 06/02/2017  . Chronic midline low back pain without sciatica 05/14/2017  . Proteinuria 11/22/2016  . Skin lesion 11/12/2015  . Preop exam for internal medicine 01/16/2015  . Cellulitis  and abscess of leg 10/26/2013  . DM foot ulcer ([place]) 10/23/2013  . Preventative health care 07/05/2011  . History of colonic polyps 09/05/2009  . COLONIC POLYPS 03/14/2008  . DIVERTICULOSIS, COLON 03/14/2008  . Hyperlipidemia 06/27/2007  . CELLULITIS/ABSCESS, LEG 06/27/2007  . Diabetes ([place]) 05/21/2007  . GOUT NOS 05/21/2007  . Morbid obesity ([place]) 05/21/2007  . ERECTILE DYSFUNCTION 05/21/2007  . OSA (obstructive sleep apnea) 05/21/2007  . Essential hypertension 05/21/2007   Past Medical History:  Diagnosis Date  . Arthritis    IN KNEES  . Cellulitis 10/26/2013   LOWER RT EXTREMITY  . CELLULITIS/ABSCESS, LEG  06/27/2007  . COLONIC POLYPS   . CONGESTIVE HEART FAILURE   . DIABETES MELLITUS, TYPE II   . DIVERTICULOSIS, COLON   . ERECTILE DYSFUNCTION, ORGANIC   . GOUT NOS   . Headache   . HYPERLIPIDEMIA   . HYPERTENSION   . LOW BACK PAIN   . Morbid obesity ([place])   . SLEEP APNEA, OBSTRUCTIVE    USES CPAP    Family History  Problem Relation Age of Onset  . Cardiomyopathy Father   . Asthma Other     Past Surgical History:  Procedure Laterality Date  . APPENDECTOMY    . FOOT SURGERY     LT  . TENOTOMY / FLEXOR TENDON TRANSFER Right 03/15/2015   Procedure: TENOTOMY HT REPAIR/ULCER DEBRIDEMENT/GRAFT PREP/ACELL GRAFT APPLICATION;  Surgeon: Jana Half, DPM;  Location: [facility];  Service: Podiatry;  Laterality: Right;  HALLUX   Social History   Occupational History  . Not on file  Tobacco Use  . Smoking status: Never Smoker  . Smokeless tobacco: Never Used  Substance and Sexual Activity  . Alcohol use: Not Currently    Alcohol/week: 2.0 standard drinks    Types: 2 Cans of beer per week    Comment: pt stopped drinking 1 week ago  . Drug use: No  . Sexual activity: Not on file

## 2019-05-26 NOTE — Telephone Encounter (Signed)
Submitted VOB for Monovisc, bilateral knee. 

## 2019-05-29 ENCOUNTER — Other Ambulatory Visit: Payer: Self-pay | Admitting: Internal Medicine

## 2019-05-30 ENCOUNTER — Telehealth: Payer: Self-pay

## 2019-05-30 DIAGNOSIS — M25561 Pain in right knee: Secondary | ICD-10-CM

## 2019-05-30 DIAGNOSIS — G8929 Other chronic pain: Secondary | ICD-10-CM

## 2019-05-30 NOTE — Telephone Encounter (Signed)
Approved for Monovisc, bilateral knee. Buy & Bill Covered at 100% through his insurance. Co-pay of $50.00? No PA required  Referred to Dr. Ernestina Patches per Dr. Erlinda Hong

## 2019-06-21 ENCOUNTER — Other Ambulatory Visit: Payer: Self-pay

## 2019-06-21 ENCOUNTER — Ambulatory Visit: Payer: Self-pay

## 2019-06-21 ENCOUNTER — Other Ambulatory Visit (INDEPENDENT_AMBULATORY_CARE_PROVIDER_SITE_OTHER): Payer: PRIVATE HEALTH INSURANCE

## 2019-06-21 ENCOUNTER — Ambulatory Visit (INDEPENDENT_AMBULATORY_CARE_PROVIDER_SITE_OTHER): Payer: PRIVATE HEALTH INSURANCE | Admitting: Physical Medicine and Rehabilitation

## 2019-06-21 ENCOUNTER — Encounter: Payer: Self-pay | Admitting: Physical Medicine and Rehabilitation

## 2019-06-21 DIAGNOSIS — M25562 Pain in left knee: Secondary | ICD-10-CM

## 2019-06-21 DIAGNOSIS — G8929 Other chronic pain: Secondary | ICD-10-CM

## 2019-06-21 DIAGNOSIS — M25561 Pain in right knee: Secondary | ICD-10-CM

## 2019-06-21 DIAGNOSIS — E119 Type 2 diabetes mellitus without complications: Secondary | ICD-10-CM | POA: Diagnosis not present

## 2019-06-21 LAB — BASIC METABOLIC PANEL
BUN: 27 mg/dL — ABNORMAL HIGH (ref 6–23)
CO2: 24 mEq/L (ref 19–32)
Calcium: 9.1 mg/dL (ref 8.4–10.5)
Chloride: 108 mEq/L (ref 96–112)
Creatinine, Ser: 1.31 mg/dL (ref 0.40–1.50)
GFR: 55 mL/min — ABNORMAL LOW (ref >60.00)
Glucose, Bld: 167 mg/dL — ABNORMAL HIGH (ref 70–99)
Potassium: 5.2 mEq/L — ABNORMAL HIGH (ref 3.5–5.1)
Sodium: 140 mEq/L (ref 135–145)

## 2019-06-21 LAB — HEPATIC FUNCTION PANEL
ALT: 13 U/L (ref 0–53)
AST: 11 U/L (ref 0–37)
Albumin: 3.6 g/dL (ref 3.5–5.2)
Alkaline Phosphatase: 80 U/L (ref 39–117)
Bilirubin, Direct: 0.1 mg/dL (ref 0.0–0.3)
Total Bilirubin: 0.5 mg/dL (ref 0.2–1.2)
Total Protein: 7.1 g/dL (ref 6.0–8.3)

## 2019-06-21 LAB — LIPID PANEL
Cholesterol: 118 mg/dL (ref 0–200)
HDL: 45.9 mg/dL (ref >39.00)
LDL Cholesterol: 52 mg/dL (ref 0–99)
NonHDL: 72.54
Total CHOL/HDL Ratio: 3
Triglycerides: 105 mg/dL (ref 0.0–149.0)
VLDL: 21 mg/dL (ref 0.0–40.0)

## 2019-06-21 LAB — HEMOGLOBIN A1C: Hgb A1c MFr Bld: 6.7 % — ABNORMAL HIGH (ref 4.6–6.5)

## 2019-06-21 NOTE — Progress Notes (Signed)
Gerald Day - 63 y.o. male MRN [id number]  Date of birth: Jul 08, 1956  Office Visit Note: Visit Date: 06/21/2019 PCP: Biagio Borg, MD Referred by: Biagio Borg, MD  Subjective: Chief Complaint  Patient presents with  . Left Knee - Pain  . Right Knee - Pain   HPI:  Gerald Day is a 63 y.o. male who comes in today Bilateral knee injections.  Patient has knee arthritis bilaterally.  He is unable to get an injection by Dr. Erlinda Hong to the patient's body habitus.  Patient is morbidly obese and weighs 392 pounds.  He did well in the past with viscosupplementation.  There appears to have been some cross communication in terms of the Visco supplementation.  We did complete bilateral corticosteroid injection today.  Depending on results would get him back in for viscosupplementation injections bilaterally.  It appears researching this he has been approved.  This was all discussed with him at the time.  ROS Otherwise per HPI.  Assessment & Plan: Visit Diagnoses:  1. Chronic pain of left knee   2. Chronic pain of right knee     Plan: No additional findings.   Meds & Orders: No orders of the defined types were placed in this encounter.   Orders Placed This Encounter  Procedures  . Large Joint Inj: bilateral knee  . XR C-ARM NO REPORT    Follow-up: No follow-ups on file.   Procedures: Large Joint Inj: bilateral knee on 06/21/2019 2:51 PM Indications: pain and diagnostic evaluation Details: 22 G 3.5 in needle, fluoroscopy-guided anterolateral approach  Arthrogram: No  Medications (Right): 60 mg triamcinolone acetonide 40 MG/ML; 4 mL bupivacaine 0.25 % Medications (Left): 60 mg triamcinolone acetonide 40 MG/ML; 4 mL bupivacaine 0.25 % Outcome: tolerated well, no immediate complications  There was excellent flow of contrast producing a partial arthrogram of the knee. The patient did have relief of symptoms during the anesthetic phase of the injection.  Consent was given by the  patient. Immediately prior to procedure a time out was called to verify the correct patient, procedure, equipment, support staff and site/side marked as required. Patient was prepped and draped in the usual sterile fashion.      No notes on file   Clinical History: MRI LUMBAR SPINE WITHOUT CONTRAST  TECHNIQUE: Multiplanar, multisequence MR imaging of the lumbar spine was performed. No intravenous contrast was administered.  COMPARISON:  None.  FINDINGS: Segmentation:  Standard.  Alignment:  Physiologic.  Vertebrae:  No fracture, evidence of discitis, or bone lesion.  Conus medullaris: Extends to the L1 level and appears normal.  Paraspinal and other soft tissues: No paraspinal abnormality.  Disc levels:  Disc spaces: Disc desiccation at L3-4, L4-5 and L5-S1.  T12-L1: No significant disc bulge. No evidence of neural foraminal stenosis. No central canal stenosis.  L1-L2: No significant disc bulge. No evidence of neural foraminal stenosis. No central canal stenosis.  L2-L3: No significant disc bulge. Mild left facet arthropathy. No evidence of neural foraminal stenosis. No central canal stenosis.  L3-L4: Central disc protrusion impressing on the thecal sac. No evidence of neural foraminal stenosis. No central canal stenosis.  L4-L5: Mild broad-based disc bulge. Right facet arthropathy with a small effusion. No evidence of neural foraminal stenosis. No central canal stenosis.  L5-S1: Mild broad-based disc bulge. Mild left facet arthropathy with mild left foraminal stenosis. No central canal stenosis.  IMPRESSION: 1. At L3-4 there is a central disc protrusion impressing on the thecal sac. 2.  At L5-S1 there is a mild broad-based disc bulge. Mild left facet arthropathy with mild left foraminal stenosis.   Electronically Signed   By: Kathreen Devoid   On: 06/26/2017 09:37     Objective:  VS:  HT:    WT:   BMI:     BP:   HR: bpm  TEMP: ( )   RESP:  Physical Exam  Ortho Exam Imaging: No results found.

## 2019-06-21 NOTE — Progress Notes (Signed)
.  Numeric Pain Rating Scale and Functional Assessment Average Pain 6   In the last MONTH (on 0-10 scale) has pain interfered with the following?  1. General activity like being  able to carry out your everyday physical activities such as walking, climbing stairs, carrying groceries, or moving a chair?  Rating(5)  -Dye Allergies.  

## 2019-06-23 ENCOUNTER — Encounter: Payer: Self-pay | Admitting: Internal Medicine

## 2019-06-23 ENCOUNTER — Other Ambulatory Visit: Payer: Self-pay

## 2019-06-23 ENCOUNTER — Ambulatory Visit (INDEPENDENT_AMBULATORY_CARE_PROVIDER_SITE_OTHER): Payer: PRIVATE HEALTH INSURANCE | Admitting: Internal Medicine

## 2019-06-23 VITALS — BP 136/88 | HR 66 | Temp 98.5°F | Ht 72.0 in | Wt 392.0 lb

## 2019-06-23 DIAGNOSIS — E785 Hyperlipidemia, unspecified: Secondary | ICD-10-CM | POA: Diagnosis not present

## 2019-06-23 DIAGNOSIS — I10 Essential (primary) hypertension: Secondary | ICD-10-CM | POA: Diagnosis not present

## 2019-06-23 DIAGNOSIS — E559 Vitamin D deficiency, unspecified: Secondary | ICD-10-CM | POA: Diagnosis not present

## 2019-06-23 DIAGNOSIS — E119 Type 2 diabetes mellitus without complications: Secondary | ICD-10-CM

## 2019-06-23 DIAGNOSIS — E611 Iron deficiency: Secondary | ICD-10-CM

## 2019-06-23 DIAGNOSIS — E538 Deficiency of other specified B group vitamins: Secondary | ICD-10-CM

## 2019-06-23 DIAGNOSIS — Z0000 Encounter for general adult medical examination without abnormal findings: Secondary | ICD-10-CM

## 2019-06-23 NOTE — Patient Instructions (Signed)
Please continue all other medications as before, and refills have been done if requested.  Please have the pharmacy call with any other refills you may need.  Please continue your efforts at being more active, low cholesterol diet, and weight control..  Please keep your appointments with your specialists as you may have planned  Please return in 6 months, or sooner if needed, with Lab testing done 3-5 days before  

## 2019-06-23 NOTE — Progress Notes (Signed)
Subjective:    Patient ID: Gerald Day, male    DOB: [birth year], 63 y.o.   MRN: [id number]  HPI  Here to f/u; overall doing ok,  Pt denies chest pain, increasing sob or doe, wheezing, orthopnea, PND, increased LE swelling, palpitations, dizziness or syncope.  Pt denies new neurological symptoms such as new headache, or facial or extremity weakness or numbness.  Pt denies polydipsia, polyuria, or low sugar episode.  Pt states overall good compliance with meds, mostly trying to follow appropriate diet, with wt overall stable,  but little exercise however. Has gained significant wt during the pandemic months. Wt Readings from Last 3 Encounters:  06/23/19 (!) 392 lb (177.8 kg)  05/26/19 (!) 380 lb (172.4 kg)  03/22/19 (!) 385 lb (174.6 kg)  Wife has uterine cancer stage 4, he is not wanting colonoscopy for now.  No new complaints Past Medical History:  Diagnosis Date  . Arthritis    IN KNEES  . Cellulitis 10/26/2013   LOWER RT EXTREMITY  . CELLULITIS/ABSCESS, LEG 06/27/2007  . COLONIC POLYPS   . CONGESTIVE HEART FAILURE   . DIABETES MELLITUS, TYPE II   . DIVERTICULOSIS, COLON   . ERECTILE DYSFUNCTION, ORGANIC   . GOUT NOS   . Headache   . HYPERLIPIDEMIA   . HYPERTENSION   . LOW BACK PAIN   . Morbid obesity ([place])   . SLEEP APNEA, OBSTRUCTIVE    USES CPAP   Past Surgical History:  Procedure Laterality Date  . APPENDECTOMY    . FOOT SURGERY     LT  . TENOTOMY / FLEXOR TENDON TRANSFER Right 03/15/2015   Procedure: TENOTOMY HT REPAIR/ULCER DEBRIDEMENT/GRAFT PREP/ACELL GRAFT APPLICATION;  Surgeon: Jana Half, DPM;  Location: [facility];  Service: Podiatry;  Laterality: Right;  HALLUX    reports that he has never smoked. He has never used smokeless tobacco. He reports previous alcohol use of about 2.0 standard drinks of alcohol per week. He reports that he does not use drugs. family history includes Asthma in an other family member; Cardiomyopathy in his father. Allergies  Allergen  Reactions  . Penicillins Other (See Comments)    Unknown childhood allergic reaction Has patient had a PCN reaction causing immediate rash, facial/tongue/throat swelling, SOB or lightheadedness with hypotension: Unknown Has patient had a PCN reaction causing severe rash involving mucus membranes or skin necrosis: Unknown Has patient had a PCN reaction that required hospitalization: No Has patient had a PCN reaction occurring within the last 10 years: No If all of the above answers are "NO", then may proceed with Cephalosporin use.    Current Outpatient Medications on File Prior to Visit  Medication Sig Dispense Refill  . amLODipine (NORVASC) 10 MG tablet TAKE 1 TABLET (10 MG TOTAL) BY MOUTH DAILY. 90 tablet 1  . atorvastatin (LIPITOR) 10 MG tablet TAKE 1 TABLET (10 MG TOTAL) BY MOUTH DAILY. 90 tablet 1  . cloNIDine (CATAPRES) 0.3 MG tablet Take 0.3 mg by mouth 3 (three) times daily.    . cyclobenzaprine (FLEXERIL) 5 MG tablet Take 1 tablet (5 mg total) by mouth 3 (three) times daily as needed for muscle spasms. 30 tablet 0  . diclofenac sodium (VOLTAREN) 1 % GEL Apply 2 g topically 4 (four) times daily. (Patient taking differently: Apply 2 g topically 3 (three) times daily. ) 1 Tube 5  . furosemide (LASIX) 80 MG tablet TAKE 1 TABLET (80 MG TOTAL) BY MOUTH DAILY AS NEEDED. 90 tablet 1  . glipiZIDE (  GLUCOTROL XL) 5 MG 24 hr tablet TAKE 1 TABLET BY MOUTH EVERY DAY WITH BREAKFAST 90 tablet 1  . indomethacin (INDOCIN) 50 MG capsule Take 1 capsule (50 mg total) by mouth 3 (three) times daily as needed. 90 capsule 1  . KLOR-CON M20 20 MEQ tablet TAKE 1 TABLET (20 MEQ TOTAL) BY MOUTH DAILY. 90 tablet 1  . metFORMIN (GLUCOPHAGE) 500 MG tablet Take 2 tablets (1,000 mg total) by mouth 2 (two) times daily with a meal. 360 tablet 3  . methylPREDNISolone (MEDROL DOSEPAK) 4 MG TBPK tablet Take as directed 21 tablet 0  . sildenafil (VIAGRA) 100 MG tablet Take 0.5-1 tablets (50-100 mg total) by mouth daily as  needed for erectile dysfunction. 5 tablet 11  . telmisartan (MICARDIS) 80 MG tablet Take 1 tablet (80 mg total) by mouth daily. 90 tablet 3   No current facility-administered medications on file prior to visit.    Review of Systems  Constitutional: Negative for other unusual diaphoresis or sweats HENT: Negative for ear discharge or swelling Eyes: Negative for other worsening visual disturbances Respiratory: Negative for stridor or other swelling  Gastrointestinal: Negative for worsening distension or other blood Genitourinary: Negative for retention or other urinary change Musculoskeletal: Negative for other MSK pain or swelling Skin: Negative for color change or other new lesions Neurological: Negative for worsening tremors and other numbness  Psychiatric/Behavioral: Negative for worsening agitation or other fatigue All otherwise neg per pt     Objective:   Physical Exam BP 136/88   Pulse 66   Temp 98.5 F (36.9 C) (Oral)   Ht 6' (1.829 m)   Wt (!) 392 lb (177.8 kg)   SpO2 96%   BMI 53.16 kg/m  VS noted,  Constitutional: Pt appears in NAD HENT: Head: NCAT.  Right Ear: External ear normal.  Left Ear: External ear normal.  Eyes: . Pupils are equal, round, and reactive to light. Conjunctivae and EOM are normal Nose: without d/c or deformity Neck: Neck supple. Gross normal ROM Cardiovascular: Normal rate and regular rhythm.   Pulmonary/Chest: Effort normal and breath sounds without rales or wheezing.  Abd:  Soft, NT, ND, + BS, no organomegaly Neurological: Pt is alert. At baseline orientation, motor grossly intact Skin: Skin is warm. No rashes, other new lesions, no LE edema Psychiatric: Pt behavior is normal without agitation  No other exam findings Lab Results  Component Value Date   WBC 6.9 12/19/2018   HGB 12.2 (L) 12/19/2018   HCT 35.9 (L) 12/19/2018   PLT 222.0 12/19/2018   GLUCOSE 167 (H) 06/21/2019   CHOL 118 06/21/2019   TRIG 105.0 06/21/2019   HDL 45.90  06/21/2019   LDLDIRECT 83.0 12/19/2018   LDLCALC 52 06/21/2019   ALT 13 06/21/2019   AST 11 06/21/2019   NA 140 06/21/2019   K 5.2 No hemolysis seen (H) 06/21/2019   CL 108 06/21/2019   CREATININE 1.31 06/21/2019   BUN 27 (H) 06/21/2019   CO2 24 06/21/2019   TSH 3.59 12/19/2018   PSA 0.32 12/19/2018   INR 1.07 02/25/2018   HGBA1C 6.7 (H) 06/21/2019   MICROALBUR 383.0 (H) 12/19/2018      Assessment & Plan:

## 2019-06-24 ENCOUNTER — Encounter: Payer: Self-pay | Admitting: Internal Medicine

## 2019-06-24 NOTE — Assessment & Plan Note (Signed)
stable overall by history and exam, recent data reviewed with pt, and pt to continue medical treatment as before,  to f/u any worsening symptoms or concerns  

## 2019-07-25 ENCOUNTER — Telehealth: Payer: Self-pay | Admitting: *Deleted

## 2019-07-25 ENCOUNTER — Other Ambulatory Visit: Payer: Self-pay | Admitting: Internal Medicine

## 2019-07-25 MED ORDER — BUPIVACAINE HCL 0.25 % IJ SOLN
4.0000 mL | INTRAMUSCULAR | Status: AC | PRN
  Administered 2019-06-21: 15:00:00 4 mL via INTRA_ARTICULAR

## 2019-07-25 MED ORDER — TRIAMCINOLONE ACETONIDE 40 MG/ML IJ SUSP
60.0000 mg | INTRAMUSCULAR | Status: AC | PRN
  Administered 2019-06-21: 15:00:00 60 mg via INTRA_ARTICULAR

## 2019-07-25 MED ORDER — BUPIVACAINE HCL 0.25 % IJ SOLN
4.0000 mL | INTRAMUSCULAR | Status: AC | PRN
  Administered 2019-06-21: 4 mL via INTRA_ARTICULAR

## 2019-07-25 NOTE — Telephone Encounter (Signed)
Called pt and lvm #1 on mobile. Called pt home # and wife states she will have him return my call.

## 2019-08-22 ENCOUNTER — Other Ambulatory Visit: Payer: Self-pay | Admitting: Internal Medicine

## 2019-10-30 ENCOUNTER — Telehealth: Payer: Self-pay | Admitting: Physical Medicine and Rehabilitation

## 2019-10-30 NOTE — Telephone Encounter (Signed)
This patient originally had approval for bilateral monovisc injections in 2020, but these were never done. Does this need to be authorized again? Please send back to me for scheduling.

## 2019-10-30 NOTE — Telephone Encounter (Signed)
Yes, make sure we do the monovisc injections and this is detailed!

## 2019-10-31 NOTE — Telephone Encounter (Signed)
Submitted for VOB for Monovisc-Bilateral knee  

## 2019-11-01 ENCOUNTER — Telehealth: Payer: Self-pay

## 2019-11-01 NOTE — Telephone Encounter (Signed)
Called patient on home and mobile numbers. No answer and unable to leave message on either phone.

## 2019-11-01 NOTE — Telephone Encounter (Signed)
Approved for Monovisc-Bilateral knee Dr. Gae Bon and Bill $50 copay No OOP Per monovisc prior Josem Kaufmann was required Called and advised it was approved Ref # [id number]

## 2019-11-03 NOTE — Telephone Encounter (Signed)
Called patient again on both home and mobile numbers. No answer and unable to leave message on either phone.

## 2019-11-09 NOTE — Telephone Encounter (Signed)
Will need to schedule for 30 minutes.

## 2019-11-09 NOTE — Telephone Encounter (Signed)
Spoke to wife and she will have him call back.

## 2019-11-09 NOTE — Telephone Encounter (Signed)
Called patient to schedule. Wife advised to call back after 1630.

## 2019-11-13 ENCOUNTER — Telehealth: Payer: Self-pay | Admitting: Radiology

## 2019-11-13 NOTE — Telephone Encounter (Signed)
Patient came bt this morning to make an appt with Dr. Ernestina Patches. He would like to schedule an appt for bilateral knee injections. Please call patient advise. Usually he sees Dr. Erlinda Hong for this problem.

## 2019-11-14 ENCOUNTER — Other Ambulatory Visit: Payer: Self-pay

## 2019-11-14 MED ORDER — POTASSIUM CHLORIDE CRYS ER 20 MEQ PO TBCR: 20.0000 meq | EXTENDED_RELEASE_TABLET | Freq: Every day | ORAL | 0 refills | Status: DC

## 2019-11-14 NOTE — Telephone Encounter (Signed)
Scheduled for 3/19. Patient aware of $50 copay.

## 2019-11-24 ENCOUNTER — Ambulatory Visit: Payer: Self-pay

## 2019-11-24 ENCOUNTER — Ambulatory Visit: Payer: PRIVATE HEALTH INSURANCE | Admitting: Physical Medicine and Rehabilitation

## 2019-11-24 ENCOUNTER — Encounter: Payer: Self-pay | Admitting: Physical Medicine and Rehabilitation

## 2019-11-24 ENCOUNTER — Other Ambulatory Visit: Payer: Self-pay

## 2019-11-24 VITALS — BP 158/64 | HR 64 | Ht 72.0 in | Wt 386.0 lb

## 2019-11-24 DIAGNOSIS — G8929 Other chronic pain: Secondary | ICD-10-CM

## 2019-11-24 DIAGNOSIS — M25562 Pain in left knee: Secondary | ICD-10-CM

## 2019-11-24 NOTE — Progress Notes (Signed)
   Numeric Pain Rating Scale and Functional Assessment Average Pain 5/6 out of 10   In the last MONTH (on 0-10 scale) has pain interfered with the following?  1. General activity like being  able to carry out your everyday physical activities such as walking, climbing stairs, carrying groceries, or moving a chair?  Rating 8/10   +Driver, -BT, -Dye Allergies.

## 2019-11-28 DIAGNOSIS — G8929 Other chronic pain: Secondary | ICD-10-CM

## 2019-11-28 DIAGNOSIS — M1711 Unilateral primary osteoarthritis, right knee: Secondary | ICD-10-CM

## 2019-11-28 DIAGNOSIS — M1712 Unilateral primary osteoarthritis, left knee: Secondary | ICD-10-CM

## 2019-11-28 MED ORDER — HYALURONAN 88 MG/4ML IX SOSY
88.0000 mg | PREFILLED_SYRINGE | INTRA_ARTICULAR | Status: AC | PRN
  Administered 2019-11-28: 88 mg via INTRA_ARTICULAR

## 2019-11-28 NOTE — Progress Notes (Signed)
Gerald Day - 64 y.o. male MRN [id number]  Date of birth: 04/01/1956  Office Visit Note: Visit Date: 11/24/2019 PCP: Biagio Borg, MD Referred by: Biagio Borg, MD  Subjective: Chief Complaint  Patient presents with  . Left Knee - Pain  . Right Knee - Pain   HPI:  Gerald Day is a 64 y.o. male who comes in today For planned bilateral Monovisc knee injections with fluoroscopic guidance.  Patient is followed by Dr. Eduard Roux.  He has had great success with these in the past and he has been approved for repeat Monovisc injections.  Due to body habitus will need to do this with fluoroscopic guidance.  He will follow up with Dr. Eduard Roux as needed.  ROS Otherwise per HPI.  Assessment & Plan: Visit Diagnoses:  1. Chronic pain of left knee   2. Chronic pain of right knee     Plan: No additional findings.   Meds & Orders: No orders of the defined types were placed in this encounter.   Orders Placed This Encounter  Procedures  . Large Joint Inj  . XR C-ARM NO REPORT    Follow-up: No follow-ups on file.   Procedures: Large Joint Inj: bilateral knee on 11/28/2019 8:32 AM Indications: pain and diagnostic evaluation Details: 22 G 3.5 in needle, fluoroscopy-guided anterolateral approach  Arthrogram: No  Medications (Right): 88 mg Hyaluronan 88 MG/4ML Medications (Left): 88 mg Hyaluronan 88 MG/4ML Outcome: tolerated well, no immediate complications  There was excellent flow of contrast producing a partial arthrogram of the knees. Consent was given by the patient. Immediately prior to procedure a time out was called to verify the correct patient, procedure, equipment, support staff and site/side marked as required. Patient was prepped and draped in the usual sterile fashion.      No notes on file   Clinical History: MRI LUMBAR SPINE WITHOUT CONTRAST  TECHNIQUE: Multiplanar, multisequence MR imaging of the lumbar spine was performed. No intravenous contrast was  administered.  COMPARISON:  None.  FINDINGS: Segmentation:  Standard.  Alignment:  Physiologic.  Vertebrae:  No fracture, evidence of discitis, or bone lesion.  Conus medullaris: Extends to the L1 level and appears normal.  Paraspinal and other soft tissues: No paraspinal abnormality.  Disc levels:  Disc spaces: Disc desiccation at L3-4, L4-5 and L5-S1.  T12-L1: No significant disc bulge. No evidence of neural foraminal stenosis. No central canal stenosis.  L1-L2: No significant disc bulge. No evidence of neural foraminal stenosis. No central canal stenosis.  L2-L3: No significant disc bulge. Mild left facet arthropathy. No evidence of neural foraminal stenosis. No central canal stenosis.  L3-L4: Central disc protrusion impressing on the thecal sac. No evidence of neural foraminal stenosis. No central canal stenosis.  L4-L5: Mild broad-based disc bulge. Right facet arthropathy with a small effusion. No evidence of neural foraminal stenosis. No central canal stenosis.  L5-S1: Mild broad-based disc bulge. Mild left facet arthropathy with mild left foraminal stenosis. No central canal stenosis.  IMPRESSION: 1. At L3-4 there is a central disc protrusion impressing on the thecal sac. 2. At L5-S1 there is a mild broad-based disc bulge. Mild left facet arthropathy with mild left foraminal stenosis.   Electronically Signed   By: Kathreen Devoid   On: 06/26/2017 09:37     Objective:  VS:  HT:6' (182.9 cm)   WT:(!) 386 lb (175.1 kg)  BMI:52.34    BP:(!) 158/64  HR:64bpm  TEMP: ( )  RESP:  Physical Exam  Ortho Exam Imaging: No results found.

## 2019-12-01 ENCOUNTER — Other Ambulatory Visit: Payer: Self-pay | Admitting: Internal Medicine

## 2019-12-03 ENCOUNTER — Other Ambulatory Visit: Payer: Self-pay | Admitting: Internal Medicine

## 2019-12-03 NOTE — Telephone Encounter (Signed)
Please refill as per office routine med refill policy (all routine meds refilled for 3 mo or monthly per pt preference up to one year from last visit, then month to month grace period for 3 mo, then further med refills will have to be denied)  

## 2019-12-18 ENCOUNTER — Other Ambulatory Visit (INDEPENDENT_AMBULATORY_CARE_PROVIDER_SITE_OTHER): Payer: PRIVATE HEALTH INSURANCE

## 2019-12-18 ENCOUNTER — Other Ambulatory Visit: Payer: Self-pay

## 2019-12-18 DIAGNOSIS — E611 Iron deficiency: Secondary | ICD-10-CM | POA: Diagnosis not present

## 2019-12-18 DIAGNOSIS — E119 Type 2 diabetes mellitus without complications: Secondary | ICD-10-CM

## 2019-12-18 DIAGNOSIS — E559 Vitamin D deficiency, unspecified: Secondary | ICD-10-CM | POA: Diagnosis not present

## 2019-12-18 DIAGNOSIS — Z0000 Encounter for general adult medical examination without abnormal findings: Secondary | ICD-10-CM

## 2019-12-18 DIAGNOSIS — E538 Deficiency of other specified B group vitamins: Secondary | ICD-10-CM | POA: Diagnosis not present

## 2019-12-18 LAB — HEPATIC FUNCTION PANEL
ALT: 14 U/L (ref 0–53)
AST: 11 U/L (ref 0–37)
Albumin: 3.6 g/dL (ref 3.5–5.2)
Alkaline Phosphatase: 86 U/L (ref 39–117)
Bilirubin, Direct: 0.1 mg/dL (ref 0.0–0.3)
Total Bilirubin: 0.5 mg/dL (ref 0.2–1.2)
Total Protein: 6.3 g/dL (ref 6.0–8.3)

## 2019-12-18 LAB — CBC WITH DIFFERENTIAL/PLATELET
Basophils Absolute: 0.1 10*3/uL (ref 0.0–0.1)
Basophils Relative: 1 % (ref 0.0–3.0)
Eosinophils Absolute: 0.5 10*3/uL (ref 0.0–0.7)
Eosinophils Relative: 5.6 % — ABNORMAL HIGH (ref 0.0–5.0)
HCT: 34.2 % — ABNORMAL LOW (ref 39.0–52.0)
Hemoglobin: 11.6 g/dL — ABNORMAL LOW (ref 13.0–17.0)
Lymphocytes Relative: 12.7 % (ref 12.0–46.0)
Lymphs Abs: 1 10*3/uL (ref 0.7–4.0)
MCHC: 33.9 g/dL (ref 30.0–36.0)
MCV: 89.9 fl (ref 78.0–100.0)
Monocytes Absolute: 0.7 10*3/uL (ref 0.1–1.0)
Monocytes Relative: 8.2 % (ref 3.0–12.0)
Neutro Abs: 5.9 10*3/uL (ref 1.4–7.7)
Neutrophils Relative %: 72.5 % (ref 43.0–77.0)
Platelets: 192 10*3/uL (ref 150.0–400.0)
RBC: 3.8 Mil/uL — ABNORMAL LOW (ref 4.22–5.81)
RDW: 13.8 % (ref 11.5–15.5)
WBC: 8.2 10*3/uL (ref 4.0–10.5)

## 2019-12-18 LAB — LIPID PANEL
Cholesterol: 151 mg/dL (ref 0–200)
HDL: 42.4 mg/dL (ref >39.00)
LDL Cholesterol: 78 mg/dL (ref 0–99)
NonHDL: 108.77
Total CHOL/HDL Ratio: 4
Triglycerides: 155 mg/dL — ABNORMAL HIGH (ref 0.0–149.0)
VLDL: 31 mg/dL (ref 0.0–40.0)

## 2019-12-18 LAB — URINALYSIS, ROUTINE W REFLEX MICROSCOPIC
Bilirubin Urine: NEGATIVE
Ketones, ur: NEGATIVE
Leukocytes,Ua: NEGATIVE
Nitrite: NEGATIVE
Specific Gravity, Urine: 1.025 (ref 1.000–1.030)
Total Protein, Urine: >=300 — AB
Urine Glucose: NEGATIVE
Urobilinogen, UA: 0.2 (ref 0.0–1.0)
pH: 5.5 (ref 5.0–8.0)

## 2019-12-18 LAB — MICROALBUMIN / CREATININE URINE RATIO
Creatinine,U: 132.8 mg/dL
Microalb Creat Ratio: 235.1 mg/g — ABNORMAL HIGH (ref 0.0–30.0)
Microalb, Ur: 312.3 mg/dL — ABNORMAL HIGH (ref 0.0–1.9)

## 2019-12-18 LAB — TSH: TSH: 2.23 u[IU]/mL (ref 0.35–4.50)

## 2019-12-18 LAB — VITAMIN D 25 HYDROXY (VIT D DEFICIENCY, FRACTURES): VITD: 8.02 ng/mL — ABNORMAL LOW (ref 30.00–100.00)

## 2019-12-18 LAB — IBC PANEL
Iron: 57 ug/dL (ref 42–165)
Saturation Ratios: 17.1 % — ABNORMAL LOW (ref 20.0–50.0)
Transferrin: 238 mg/dL (ref 212.0–360.0)

## 2019-12-18 LAB — BASIC METABOLIC PANEL
BUN: 35 mg/dL — ABNORMAL HIGH (ref 6–23)
CO2: 25 mEq/L (ref 19–32)
Calcium: 8.9 mg/dL (ref 8.4–10.5)
Chloride: 107 mEq/L (ref 96–112)
Creatinine, Ser: 1.4 mg/dL (ref 0.40–1.50)
GFR: 50.86 mL/min — ABNORMAL LOW (ref >60.00)
Glucose, Bld: 155 mg/dL — ABNORMAL HIGH (ref 70–99)
Potassium: 4.8 mEq/L (ref 3.5–5.1)
Sodium: 138 mEq/L (ref 135–145)

## 2019-12-18 LAB — PSA: PSA: 0.46 ng/mL (ref 0.10–4.00)

## 2019-12-18 LAB — HEMOGLOBIN A1C: Hgb A1c MFr Bld: 6.7 % — ABNORMAL HIGH (ref 4.6–6.5)

## 2019-12-18 LAB — VITAMIN B12: Vitamin B-12: 161 pg/mL — ABNORMAL LOW (ref 211–911)

## 2019-12-19 ENCOUNTER — Ambulatory Visit
Admission: EM | Admit: 2019-12-19 | Discharge: 2019-12-19 | Disposition: A | Discharge status: Home / Self Care | Payer: PRIVATE HEALTH INSURANCE | Attending: Physician Assistant | Admitting: Physician Assistant

## 2019-12-19 DIAGNOSIS — I10 Essential (primary) hypertension: Secondary | ICD-10-CM | POA: Diagnosis not present

## 2019-12-19 DIAGNOSIS — M545 Low back pain, unspecified: Secondary | ICD-10-CM

## 2019-12-19 MED ORDER — DICLOFENAC SODIUM 50 MG PO TBEC: 50.0000 mg | DELAYED_RELEASE_TABLET | Freq: Two times a day (BID) | ORAL | 0 refills | Status: DC

## 2019-12-19 MED ORDER — TIZANIDINE HCL 2 MG PO TABS: 2.0000 mg | ORAL_TABLET | Freq: Three times a day (TID) | ORAL | 0 refills | Status: DC | PRN

## 2019-12-19 NOTE — Discharge Instructions (Addendum)
Start diclofenac. Do not take ibuprofen (motrin/advil)/ naproxen (aleve) while on diclofenac. tizanidine as needed, this can make you drowsy, so do not take if you are going to drive, operate heavy machinery, or make important decisions. Ice/heat compresses as needed. This can take up to 3-4 weeks to completely resolve, but you should be feeling better each week. Follow up with PCP/orthopedics if symptoms worsen, changes for reevaluation. If experience numbness/tingling of the inner thighs, loss of bladder or bowel control, go to the emergency department for evaluation.

## 2019-12-19 NOTE — ED Triage Notes (Signed)
Pt c/o lower lt side back pain after bending over to pick up something. Denies injury or pain radiating.

## 2019-12-19 NOTE — ED Provider Notes (Signed)
[facility]    CSN: [id number] Arrival date & time: 12/19/19  0941      History   Chief Complaint Chief Complaint  Patient presents with  . Back Pain    HPI TAHEIM FITT is a 64 y.o. male.   64 year old male with history of noninsulin dependant DM, HTN, HLD comes in for 2 day history of left sided back pain. States was bending over to pick something up when he felt the pain. Since then, he has had pain with movement of the back, particularly with changes in position. Denies radiation of pain. Denies leg weakness, numbness/tingling.  Denies saddle anesthesia, loss of bladder or bowel control. Tylenol 1300mg  without relief.  a1c 6.9     Past Medical History:  Diagnosis Date  . Arthritis    IN KNEES  . Cellulitis 10/26/2013   LOWER RT EXTREMITY  . CELLULITIS/ABSCESS, LEG 06/27/2007  . COLONIC POLYPS   . CONGESTIVE HEART FAILURE   . DIABETES MELLITUS, TYPE II   . DIVERTICULOSIS, COLON   . ERECTILE DYSFUNCTION, ORGANIC   . GOUT NOS   . Headache   . HYPERLIPIDEMIA   . HYPERTENSION   . LOW BACK PAIN   . Morbid obesity ([place])   . SLEEP APNEA, OBSTRUCTIVE    USES CPAP    Patient Active Problem List   Diagnosis Date Noted  . Glaucoma 12/22/2018  . Radiculopathy, lumbar region 01/17/2018  . Constipation 06/18/2017  . AKI (acute kidney injury) ([place]) 06/02/2017  . Chronic midline low back pain without sciatica 05/14/2017  . Proteinuria 11/22/2016  . Skin lesion 11/12/2015  . Preop exam for internal medicine 01/16/2015  . Cellulitis and abscess of leg 10/26/2013  . DM foot ulcer ([place]) 10/23/2013  . Preventative health care 07/05/2011  . History of colonic polyps 09/05/2009  . COLONIC POLYPS 03/14/2008  . DIVERTICULOSIS, COLON 03/14/2008  . Hyperlipidemia 06/27/2007  . CELLULITIS/ABSCESS, LEG 06/27/2007  . Diabetes ([place]) 05/21/2007  . GOUT NOS 05/21/2007  . Morbid obesity ([place]) 05/21/2007  . ERECTILE DYSFUNCTION 05/21/2007  . OSA (obstructive  sleep apnea) 05/21/2007  . Essential hypertension 05/21/2007    Past Surgical History:  Procedure Laterality Date  . APPENDECTOMY    . FOOT SURGERY     LT  . TENOTOMY / FLEXOR TENDON TRANSFER Right 03/15/2015   Procedure: TENOTOMY HT REPAIR/ULCER DEBRIDEMENT/GRAFT PREP/ACELL GRAFT APPLICATION;  Surgeon: Jana Half, DPM;  Location: [place];  Service: Podiatry;  Laterality: Right;  HALLUX       Home Medications    Prior to Admission medications   Medication Sig Start Date End Date Taking? Authorizing Provider  amLODipine (NORVASC) 10 MG tablet TAKE 1 TABLET BY MOUTH EVERY DAY 07/25/19   Biagio Borg, MD  atorvastatin (LIPITOR) 10 MG tablet TAKE 1 TABLET (10 MG TOTAL) BY MOUTH DAILY. 08/22/19   Biagio Borg, MD  cloNIDine (CATAPRES) 0.3 MG tablet Take 0.3 mg by mouth 3 (three) times daily.    [provider]  diclofenac (VOLTAREN) 50 MG EC tablet Take 1 tablet (50 mg total) by mouth 2 (two) times daily. 12/19/19   Tasia Catchings, [place] Dirosa V, PA-C  furosemide (LASIX) 80 MG tablet TAKE 1 TABLET (80 MG TOTAL) BY MOUTH DAILY AS NEEDED. 04/26/19   Biagio Borg, MD  glipiZIDE (GLUCOTROL XL) 5 MG 24 hr tablet TAKE 1 TABLET BY MOUTH EVERY DAY WITH BREAKFAST 12/04/19   Biagio Borg, MD  indomethacin (INDOCIN) 50 MG capsule TAKE  1 CAPSULE (50 MG TOTAL) BY MOUTH 3 (THREE) TIMES DAILY AS NEEDED. 12/01/19   Biagio Borg, MD  metFORMIN (GLUCOPHAGE) 500 MG tablet Take 2 tablets (1,000 mg total) by mouth 2 (two) times daily with a meal. 02/27/19   Biagio Borg, MD  potassium chloride SA (KLOR-CON M20) 20 MEQ tablet Take 1 tablet (20 mEq total) by mouth daily. 11/14/19   Biagio Borg, MD  sildenafil (VIAGRA) 100 MG tablet Take 0.5-1 tablets (50-100 mg total) by mouth daily as needed for erectile dysfunction. 12/17/17   Biagio Borg, MD  telmisartan (MICARDIS) 80 MG tablet Take 1 tablet (80 mg total) by mouth daily. 10/24/18   Biagio Borg, MD  tiZANidine (ZANAFLEX) 2 MG tablet Take 1 tablet (2 mg total) by mouth  every 8 (eight) hours as needed for muscle spasms. 12/19/19   Ok Edwards, PA-C    Family History Family History  Problem Relation Age of Onset  . Cardiomyopathy Father   . Asthma Other     Social History Social History   Tobacco Use  . Smoking status: Never Smoker  . Smokeless tobacco: Never Used  Substance Use Topics  . Alcohol use: Not Currently    Alcohol/week: 2.0 standard drinks    Types: 2 Cans of beer per week    Comment: pt stopped drinking 1 week ago  . Drug use: No     Allergies   Penicillins   Review of Systems Review of Systems  Reason unable to perform ROS: See HPI as above.     Physical Exam Triage Vital Signs ED Triage Vitals  Enc Vitals Group     BP 12/19/19 0958 (!) 168/87     Pulse Rate 12/19/19 0951 77     Resp 12/19/19 0951 (!) 22     Temp 12/19/19 0951 98 F (36.7 C)     Temp Source 12/19/19 0951 Oral     SpO2 12/19/19 0951 98 %     Weight --      Height --      Head Circumference --      Peak Flow --      Pain Score 12/19/19 0959 10     Pain Loc --      Pain Edu? --      Excl. in [place]? --    No data found.  Updated Vital Signs BP (!) 168/87 (BP Location: Left Arm)   Pulse 77   Temp 98 F (36.7 C) (Oral)   Resp (!) 22   SpO2 98%   Visual Acuity Right Eye Distance:   Left Eye Distance:   Bilateral Distance:    Right Eye Near:   Left Eye Near:    Bilateral Near:     Physical Exam Constitutional:      General: He is not in acute distress.    Appearance: He is well-developed. He is not diaphoretic.  HENT:     Head: Normocephalic and atraumatic.  Eyes:     Conjunctiva/sclera: Conjunctivae normal.     Pupils: Pupils are equal, round, and reactive to light.  Cardiovascular:     Rate and Rhythm: Normal rate and regular rhythm.     Heart sounds: Normal heart sounds. No murmur. No friction rub. No gallop.   Pulmonary:     Effort: Pulmonary effort is normal. No accessory muscle usage or respiratory distress.     Breath  sounds: Normal breath sounds. No stridor. No decreased breath sounds, wheezing,  rhonchi or rales.  Musculoskeletal:     Comments: No rashes, swelling, deformity, erythema. No tenderness on palpation of the spinous processes. Tenderness to palpation of paraspinal muscles to the L4-L5 region. Decreased flexion of back. Decreased active ROM of the hips. Decreased passive flexion of the hips. Sensation intact. Negative straight leg raise.   Skin:    General: Skin is warm and dry.  Neurological:     Mental Status: He is alert and oriented to person, place, and time.      UC Treatments / Results  Labs (all labs ordered are listed, but only abnormal results are displayed) Labs Reviewed - No data to display  EKG   Radiology No results found.  Procedures Procedures (including critical care time)  Medications Ordered in UC Medications - No data to display  Initial Impression / Assessment and Plan / UC Course  I have reviewed the triage vital signs and the nursing notes.  Pertinent labs & imaging results that were available during my care of the patient were reviewed by me and considered in my medical decision making (see chart for details).    Start NSAID as directed for pain and inflammation. Muscle relaxant as needed. Ice/heat compresses. Discussed with patient this can take up to 3-4 weeks to resolve, but should be getting better each week. Return precautions given.   Final Clinical Impressions(s) / UC Diagnoses   Final diagnoses:  Acute left-sided low back pain without sciatica   ED Prescriptions    Medication Sig Dispense Auth. Provider   diclofenac (VOLTAREN) 50 MG EC tablet Take 1 tablet (50 mg total) by mouth 2 (two) times daily. 20 tablet Shontell Prosser V, PA-C   tiZANidine (ZANAFLEX) 2 MG tablet Take 1 tablet (2 mg total) by mouth every 8 (eight) hours as needed for muscle spasms. 15 tablet Ok Edwards, PA-C     PDMP not reviewed this encounter.   Ok Edwards, PA-C 12/19/19  1116

## 2019-12-22 ENCOUNTER — Ambulatory Visit: Payer: PRIVATE HEALTH INSURANCE | Admitting: Internal Medicine

## 2019-12-27 ENCOUNTER — Other Ambulatory Visit: Payer: Self-pay | Admitting: Internal Medicine

## 2019-12-27 NOTE — Telephone Encounter (Signed)
Please refill as per office routine med refill policy (all routine meds refilled for 3 mo or monthly per pt preference up to one year from last visit, then month to month grace period for 3 mo, then further med refills will have to be denied)  

## 2019-12-28 ENCOUNTER — Other Ambulatory Visit: Payer: Self-pay

## 2019-12-28 ENCOUNTER — Ambulatory Visit (INDEPENDENT_AMBULATORY_CARE_PROVIDER_SITE_OTHER): Payer: PRIVATE HEALTH INSURANCE | Admitting: Internal Medicine

## 2019-12-28 VITALS — BP 152/86 | HR 66 | Temp 98.2°F | Ht 72.0 in | Wt 384.0 lb

## 2019-12-28 DIAGNOSIS — Z0000 Encounter for general adult medical examination without abnormal findings: Secondary | ICD-10-CM | POA: Diagnosis not present

## 2019-12-28 DIAGNOSIS — M5416 Radiculopathy, lumbar region: Secondary | ICD-10-CM

## 2019-12-28 DIAGNOSIS — E119 Type 2 diabetes mellitus without complications: Secondary | ICD-10-CM | POA: Diagnosis not present

## 2019-12-28 DIAGNOSIS — G4733 Obstructive sleep apnea (adult) (pediatric): Secondary | ICD-10-CM | POA: Diagnosis not present

## 2019-12-28 DIAGNOSIS — E538 Deficiency of other specified B group vitamins: Secondary | ICD-10-CM

## 2019-12-28 DIAGNOSIS — Z0001 Encounter for general adult medical examination with abnormal findings: Secondary | ICD-10-CM

## 2019-12-28 DIAGNOSIS — I10 Essential (primary) hypertension: Secondary | ICD-10-CM | POA: Diagnosis not present

## 2019-12-28 DIAGNOSIS — E559 Vitamin D deficiency, unspecified: Secondary | ICD-10-CM | POA: Insufficient documentation

## 2019-12-28 MED ORDER — VITAMIN B-12 1000 MCG PO TABS: 1000.0000 ug | ORAL_TABLET | Freq: Every day | ORAL | 3 refills | Status: DC

## 2019-12-28 MED ORDER — VITAMIN D (ERGOCALCIFEROL) 1.25 MG (50000 UNIT) PO CAPS: 50000.0000 [IU] | ORAL_CAPSULE | ORAL | 0 refills | Status: DC

## 2019-12-28 NOTE — Progress Notes (Signed)
Subjective:    Patient ID: Gerald Day, male    DOB: 04-16-1956, 64 y.o.   MRN: [id number]  HPI  Here for wellness and f/u;  Overall doing ok;  Pt denies Chest pain, worsening SOB, DOE, wheezing, orthopnea, PND, worsening LE edema, palpitations, dizziness or syncope.  Pt denies neurological change such as new headache, facial or extremity weakness.  Pt denies polydipsia, polyuria, or low sugar symptoms. Pt states overall good compliance with treatment and medications, good tolerability, and has been trying to follow appropriate diet.  Pt denies worsening depressive symptoms, suicidal ideation or panic. No fever, night sweats, wt loss, loss of appetite, or other constitutional symptoms.  Pt states good ability with ADL's, has low fall risk, home safety reviewed and adequate, no other significant changes in hearing or vision, and only occasionally active with exercise.  Plans to get the covid vaccination soon. Wife died with cancer dc 2018-12-13, still grievig, may do counseling with hospice soon. Plans to see eye doctor soon. Pt continues to have recurring LBP without change in severity, bowel or bladder change, fever, wt loss,  worsening LE pain/numbness/weakness, gait change or falls. Has ongoing sleep apnea symptoms and machine no longer working, last f/u about 7 yrs ago, needs new referral  bp at home has been < !40/90 and does not want med change, trying to lose wt Past Medical History:  Diagnosis Date  . Arthritis    IN KNEES  . Cellulitis 10/26/2013   LOWER RT EXTREMITY  . CELLULITIS/ABSCESS, LEG 06/27/2007  . COLONIC POLYPS   . CONGESTIVE HEART FAILURE   . DIABETES MELLITUS, TYPE II   . DIVERTICULOSIS, COLON   . ERECTILE DYSFUNCTION, ORGANIC   . GOUT NOS   . Headache   . HYPERLIPIDEMIA   . HYPERTENSION   . LOW BACK PAIN   . Morbid obesity ([place])   . SLEEP APNEA, OBSTRUCTIVE    USES CPAP   Past Surgical History:  Procedure Laterality Date  . APPENDECTOMY    . FOOT SURGERY     LT   . TENOTOMY / FLEXOR TENDON TRANSFER Right 03/15/2015   Procedure: TENOTOMY HT REPAIR/ULCER DEBRIDEMENT/GRAFT PREP/ACELL GRAFT APPLICATION;  Surgeon: Jana Half, DPM;  Location: [place];  Service: Podiatry;  Laterality: Right;  HALLUX    reports that he has never smoked. He has never used smokeless tobacco. He reports previous alcohol use of about 2.0 standard drinks of alcohol per week. He reports that he does not use drugs. family history includes Asthma in an other family member; Cardiomyopathy in his father. Allergies  Allergen Reactions  . Penicillins Other (See Comments)    Unknown childhood allergic reaction Has patient had a PCN reaction causing immediate rash, facial/tongue/throat swelling, SOB or lightheadedness with hypotension: Unknown Has patient had a PCN reaction causing severe rash involving mucus membranes or skin necrosis: Unknown Has patient had a PCN reaction that required hospitalization: No Has patient had a PCN reaction occurring within the last 10 years: No If all of the above answers are "NO", then may proceed with Cephalosporin use.   . Tizanidine Other (See Comments)   Current Outpatient Medications on File Prior to Visit  Medication Sig Dispense Refill  . amLODipine (NORVASC) 10 MG tablet TAKE 1 TABLET BY MOUTH EVERY DAY 90 tablet 1  . atorvastatin (LIPITOR) 10 MG tablet TAKE 1 TABLET (10 MG TOTAL) BY MOUTH DAILY. 90 tablet 1  . cloNIDine (CATAPRES) 0.3 MG tablet Take 0.3 mg by mouth  3 (three) times daily.    . diclofenac (VOLTAREN) 50 MG EC tablet Take 1 tablet (50 mg total) by mouth 2 (two) times daily. 20 tablet 0  . furosemide (LASIX) 80 MG tablet TAKE 1 TABLET (80 MG TOTAL) BY MOUTH DAILY AS NEEDED. 90 tablet 1  . glipiZIDE (GLUCOTROL XL) 5 MG 24 hr tablet TAKE 1 TABLET BY MOUTH EVERY DAY WITH BREAKFAST 90 tablet 1  . indomethacin (INDOCIN) 50 MG capsule TAKE 1 CAPSULE (50 MG TOTAL) BY MOUTH 3 (THREE) TIMES DAILY AS NEEDED. 90 capsule 1  . metFORMIN  (GLUCOPHAGE) 500 MG tablet Take 2 tablets (1,000 mg total) by mouth 2 (two) times daily with a meal. Annual appt is due in must see provider for future refills 120 tablet 0  . potassium chloride SA (KLOR-CON M20) 20 MEQ tablet Take 1 tablet (20 mEq total) by mouth daily. 90 tablet 0  . sildenafil (VIAGRA) 100 MG tablet Take 0.5-1 tablets (50-100 mg total) by mouth daily as needed for erectile dysfunction. 5 tablet 11  . telmisartan (MICARDIS) 80 MG tablet Take 1 tablet (80 mg total) by mouth daily. 90 tablet 3   No current facility-administered medications on file prior to visit.     Review of Systems All otherwise neg per pt     Objective:   Physical Exam BP (!) 152/86 (BP Location: Left Arm, Patient Position: Sitting, Cuff Size: Large)   Pulse 66   Temp 98.2 F (36.8 C) (Oral)   Ht 6' (1.829 m)   Wt (!) 384 lb (174.2 kg)   SpO2 97%   BMI 52.08 kg/m  VS noted,  Constitutional: Pt appears in NAD HENT: Head: NCAT.  Right Ear: External ear normal.  Left Ear: External ear normal.  Eyes: . Pupils are equal, round, and reactive to light. Conjunctivae and EOM are normal Nose: without d/c or deformity Neck: Neck supple. Gross normal ROM Cardiovascular: Normal rate and regular rhythm.   Pulmonary/Chest: Effort normal and breath sounds without rales or wheezing.  Abd:  Soft, NT, ND, + BS, no organomegaly Neurological: Pt is alert. At baseline orientation, motor grossly intact Skin: Skin is warm. No rashes, other new lesions, no LE edema Psychiatric: Pt behavior is normal without agitation  All otherwise neg per pt Lab Results  Component Value Date   WBC 8.2 12/18/2019   HGB 11.6 (L) 12/18/2019   HCT 34.2 (L) 12/18/2019   PLT 192.0 12/18/2019   GLUCOSE 155 (H) 12/18/2019   CHOL 151 12/18/2019   TRIG 155.0 (H) 12/18/2019   HDL 42.40 12/18/2019   LDLDIRECT 83.0 12/19/2018   LDLCALC 78 12/18/2019   ALT 14 12/18/2019   AST 11 12/18/2019   NA 138 12/18/2019   K 4.8 12/18/2019    CL 107 12/18/2019   CREATININE 1.40 12/18/2019   BUN 35 (H) 12/18/2019   CO2 25 12/18/2019   TSH 2.23 12/18/2019   PSA 0.46 12/18/2019   INR 1.07 02/25/2018   HGBA1C 6.7 (H) 12/18/2019   MICROALBUR 312.3 (H) 12/18/2019      Assessment & Plan:

## 2019-12-28 NOTE — Patient Instructions (Addendum)
You can go online to [email or web address] to schedule the shot  Please continue all other medications as before, and refills have been done if requested.  Please have the pharmacy call with any other refills you may need.  Please continue your efforts at being more active, low cholesterol diet, and weight control.  You are otherwise up to date with prevention measures today.  Please keep your appointments with your specialists as you may have planned  You will be contacted regarding the referral for: pulmonary for the sleep apnea treatment  Please make an Appointment to return in 6 months, or sooner if needed, also with Lab Appointment for testing done 3-5 days before at the [place] (so this is for TWO appointments - please see the scheduling desk as you leave)'

## 2019-12-30 ENCOUNTER — Encounter: Payer: Self-pay | Admitting: Internal Medicine

## 2019-12-30 NOTE — Assessment & Plan Note (Signed)
For oral replacement 

## 2019-12-30 NOTE — Assessment & Plan Note (Signed)
For pulm referral for f/u

## 2019-12-30 NOTE — Assessment & Plan Note (Signed)
stable overall by history and exam, recent data reviewed with pt, and pt to continue medical treatment as before,  to f/u any worsening symptoms or concerns  

## 2019-12-30 NOTE — Assessment & Plan Note (Signed)
Improving,  to f/u any worsening symptoms or concerns

## 2019-12-30 NOTE — Assessment & Plan Note (Signed)

## 2019-12-30 NOTE — Assessment & Plan Note (Addendum)
stable overall by history and exam, recent data reviewed with pt, and pt to continue medical treatment as before,  to f/u any worsening symptoms or concerns  I spent 32 minutes in addition to time for CPX wellness examination in preparing to see the patient by review of recent labs, imaging and procedures, obtaining and reviewing separately obtained history, communicating with the patient and family or caregiver, ordering medications, tests or procedures, and documenting clinical information in the EHR including the differential Dx, treatment, and any further evaluation and other management of htN, vit d and b12 deficiency, lbp/radiculopathy, oSA, DM

## 2020-01-10 ENCOUNTER — Other Ambulatory Visit: Payer: Self-pay | Admitting: Internal Medicine

## 2020-01-31 ENCOUNTER — Other Ambulatory Visit: Payer: Self-pay | Admitting: Internal Medicine

## 2020-02-04 ENCOUNTER — Other Ambulatory Visit: Payer: Self-pay | Admitting: Internal Medicine

## 2020-02-11 ENCOUNTER — Other Ambulatory Visit: Payer: Self-pay | Admitting: Internal Medicine

## 2020-02-11 NOTE — Telephone Encounter (Signed)
Please refill as per office routine med refill policy (all routine meds refilled for 3 mo or monthly per pt preference up to one year from last visit, then month to month grace period for 3 mo, then further med refills will have to be denied)  

## 2020-02-12 ENCOUNTER — Other Ambulatory Visit: Payer: Self-pay | Admitting: Internal Medicine

## 2020-02-12 NOTE — Telephone Encounter (Signed)
Please refill as per office routine med refill policy (all routine meds refilled for 3 mo or monthly per pt preference up to one year from last visit, then month to month grace period for 3 mo, then further med refills will have to be denied)  

## 2020-02-25 ENCOUNTER — Other Ambulatory Visit: Payer: Self-pay | Admitting: Internal Medicine

## 2020-02-25 NOTE — Telephone Encounter (Signed)
Please refill as per office routine med refill policy (all routine meds refilled for 3 mo or monthly per pt preference up to one year from last visit, then month to month grace period for 3 mo, then further med refills will have to be denied)  

## 2020-03-14 ENCOUNTER — Other Ambulatory Visit: Payer: Self-pay | Admitting: Internal Medicine

## 2020-03-14 NOTE — Telephone Encounter (Signed)
Please change to OTC Vitamin D3 at 2000 units per day, indefinitely.  

## 2020-05-09 ENCOUNTER — Other Ambulatory Visit: Payer: Self-pay | Admitting: Internal Medicine

## 2020-05-09 NOTE — Telephone Encounter (Signed)
Please refill as per office routine med refill policy (all routine meds refilled for 3 mo or monthly per pt preference up to one year from last visit, then month to month grace period for 3 mo, then further med refills will have to be denied)  

## 2020-06-27 ENCOUNTER — Other Ambulatory Visit (INDEPENDENT_AMBULATORY_CARE_PROVIDER_SITE_OTHER): Payer: Medicare HMO

## 2020-06-27 DIAGNOSIS — E559 Vitamin D deficiency, unspecified: Secondary | ICD-10-CM

## 2020-06-27 DIAGNOSIS — E119 Type 2 diabetes mellitus without complications: Secondary | ICD-10-CM

## 2020-06-27 DIAGNOSIS — E538 Deficiency of other specified B group vitamins: Secondary | ICD-10-CM | POA: Diagnosis not present

## 2020-06-27 LAB — HEMOGLOBIN A1C: Hgb A1c MFr Bld: 7 % — ABNORMAL HIGH (ref 4.6–6.5)

## 2020-06-27 LAB — HEPATIC FUNCTION PANEL
ALT: 11 U/L (ref 0–53)
AST: 11 U/L (ref 0–37)
Albumin: 3.8 g/dL (ref 3.5–5.2)
Alkaline Phosphatase: 75 U/L (ref 39–117)
Bilirubin, Direct: 0.1 mg/dL (ref 0.0–0.3)
Total Bilirubin: 0.4 mg/dL (ref 0.2–1.2)
Total Protein: 6.8 g/dL (ref 6.0–8.3)

## 2020-06-27 LAB — VITAMIN D 25 HYDROXY (VIT D DEFICIENCY, FRACTURES): VITD: 11.25 ng/mL — ABNORMAL LOW (ref 30.00–100.00)

## 2020-06-27 LAB — LIPID PANEL
Cholesterol: 170 mg/dL (ref 0–200)
HDL: 43.9 mg/dL (ref >39.00)
LDL Cholesterol: 93 mg/dL (ref 0–99)
NonHDL: 126.18
Total CHOL/HDL Ratio: 4
Triglycerides: 166 mg/dL — ABNORMAL HIGH (ref 0.0–149.0)
VLDL: 33.2 mg/dL (ref 0.0–40.0)

## 2020-06-27 LAB — VITAMIN B12: Vitamin B-12: 208 pg/mL — ABNORMAL LOW (ref 211–911)

## 2020-06-28 ENCOUNTER — Encounter: Payer: Self-pay | Admitting: Internal Medicine

## 2020-06-28 ENCOUNTER — Other Ambulatory Visit: Payer: Self-pay | Admitting: Internal Medicine

## 2020-06-28 ENCOUNTER — Other Ambulatory Visit: Payer: PRIVATE HEALTH INSURANCE

## 2020-06-28 LAB — BASIC METABOLIC PANEL
BUN: 28 mg/dL — ABNORMAL HIGH (ref 6–23)
CO2: 27 mEq/L (ref 19–32)
Calcium: 9.4 mg/dL (ref 8.4–10.5)
Chloride: 104 mEq/L (ref 96–112)
Creatinine, Ser: 1.57 mg/dL — ABNORMAL HIGH (ref 0.40–1.50)
GFR: 49 mL/min — ABNORMAL LOW (ref >60.00)
Glucose, Bld: 135 mg/dL — ABNORMAL HIGH (ref 70–99)
Potassium: 5.3 mEq/L — ABNORMAL HIGH (ref 3.5–5.1)
Sodium: 138 mEq/L (ref 135–145)

## 2020-06-28 MED ORDER — THERA-D 2000 50 MCG (2000 UT) PO TABS: ORAL_TABLET | ORAL | 3 refills | Status: DC

## 2020-06-28 MED ORDER — VITAMIN B-12 1000 MCG PO TABS: 1000.0000 ug | ORAL_TABLET | Freq: Every day | ORAL | 3 refills | Status: AC

## 2020-07-04 ENCOUNTER — Ambulatory Visit (INDEPENDENT_AMBULATORY_CARE_PROVIDER_SITE_OTHER): Payer: Medicare HMO | Admitting: Internal Medicine

## 2020-07-04 ENCOUNTER — Other Ambulatory Visit: Payer: Self-pay

## 2020-07-04 ENCOUNTER — Encounter: Payer: Self-pay | Admitting: Internal Medicine

## 2020-07-04 VITALS — BP 160/100 | HR 113 | Temp 98.0°F | Wt 384.0 lb

## 2020-07-04 DIAGNOSIS — N1831 Chronic kidney disease, stage 3a: Secondary | ICD-10-CM

## 2020-07-04 DIAGNOSIS — Z0000 Encounter for general adult medical examination without abnormal findings: Secondary | ICD-10-CM

## 2020-07-04 DIAGNOSIS — E538 Deficiency of other specified B group vitamins: Secondary | ICD-10-CM | POA: Diagnosis not present

## 2020-07-04 DIAGNOSIS — E1165 Type 2 diabetes mellitus with hyperglycemia: Secondary | ICD-10-CM | POA: Diagnosis not present

## 2020-07-04 DIAGNOSIS — E559 Vitamin D deficiency, unspecified: Secondary | ICD-10-CM

## 2020-07-04 DIAGNOSIS — E785 Hyperlipidemia, unspecified: Secondary | ICD-10-CM

## 2020-07-04 DIAGNOSIS — N1832 Chronic kidney disease, stage 3b: Secondary | ICD-10-CM | POA: Insufficient documentation

## 2020-07-04 DIAGNOSIS — I10 Essential (primary) hypertension: Secondary | ICD-10-CM | POA: Diagnosis not present

## 2020-07-04 MED ORDER — METFORMIN HCL 500 MG PO TABS
1000.0000 mg | ORAL_TABLET | Freq: Two times a day (BID) | ORAL | 3 refills | Status: DC
Start: 2020-07-04 — End: 2020-08-09

## 2020-07-04 MED ORDER — ATORVASTATIN CALCIUM 10 MG PO TABS: ORAL_TABLET | ORAL | 3 refills | Status: DC

## 2020-07-04 MED ORDER — FUROSEMIDE 80 MG PO TABS
80.0000 mg | ORAL_TABLET | Freq: Every day | ORAL | 3 refills | Status: DC | PRN
Start: 2020-07-04 — End: 2020-08-09

## 2020-07-04 MED ORDER — POTASSIUM CHLORIDE CRYS ER 20 MEQ PO TBCR: 20.0000 meq | EXTENDED_RELEASE_TABLET | Freq: Every day | ORAL | 3 refills | Status: DC

## 2020-07-04 MED ORDER — AMLODIPINE BESYLATE 10 MG PO TABS
10.0000 mg | ORAL_TABLET | Freq: Every day | ORAL | 3 refills | Status: DC
Start: 2020-07-04 — End: 2020-08-05

## 2020-07-04 MED ORDER — GLIPIZIDE ER 5 MG PO TB24
ORAL_TABLET | ORAL | 3 refills | Status: DC
Start: 2020-07-04 — End: 2020-08-06

## 2020-07-04 MED ORDER — TELMISARTAN 80 MG PO TABS
80.0000 mg | ORAL_TABLET | Freq: Every day | ORAL | 3 refills | Status: DC
Start: 2020-07-04 — End: 2020-08-09

## 2020-07-04 NOTE — Assessment & Plan Note (Signed)
stable overall by history and exam, recent data reviewed with pt, and pt to continue medical treatment as before,  to f/u any worsening symptoms or concerns  

## 2020-07-04 NOTE — Assessment & Plan Note (Addendum)
To start oral replacement  I spent 31 minutes in preparing to see the patient by review of recent labs, imaging and procedures, obtaining and reviewing separately obtained history, communicating with the patient and family or caregiver, ordering medications, tests or procedures, and documenting clinical information in the EHR including the differential Dx, treatment, and any further evaluation and other management of vit d and b12 def, htn, hld, dm, ckd3

## 2020-07-04 NOTE — Assessment & Plan Note (Signed)
To start oral replacement

## 2020-07-04 NOTE — Patient Instructions (Signed)
Please take all new medication as prescribed - the vitamin D3 at 2000 units per day, and the B12 supplement at 1000 mcg per day  Please continue all other medications as before, and refills have been done if requested.  Please have the pharmacy call with any other refills you may need.  Please continue your efforts at being more active, low cholesterol diet, and weight control.  Please keep your appointments with your specialists as you may have planned  Please make an Appointment to return in 6 months, or sooner if needed, also with Lab Appointment for testing done 3-5 days before at the [place] (so this is for TWO appointments - please see the scheduling desk as you leave)

## 2020-07-04 NOTE — Progress Notes (Signed)
Subjective:    Patient ID: Gerald Day, male    DOB: 1956-08-08, 64 y.o.   MRN: [id number]  HPI  Here to f/u; overall doing ok,  Pt denies chest pain, increasing sob or doe, wheezing, orthopnea, PND, increased LE swelling, palpitations, dizziness or syncope.  Pt denies new neurological symptoms such as new headache, or facial or extremity weakness or numbness.  Pt denies polydipsia, polyuria, or low sugar episode.  Pt states overall good compliance with meds, mostly trying to follow appropriate diet, with wt overall stable,  but little exercise however. Wt Readings from Last 3 Encounters:  07/04/20 (!) 384 lb (174.2 kg)  12/28/19 (!) 384 lb (174.2 kg)  11/24/19 (!) 386 lb (175.1 kg)  BP states BP < 140'90 at home.  No new complaints Past Medical History:  Diagnosis Date  . Arthritis    IN KNEES  . Cellulitis 10/26/2013   LOWER RT EXTREMITY  . CELLULITIS/ABSCESS, LEG 06/27/2007  . COLONIC POLYPS   . CONGESTIVE HEART FAILURE   . DIABETES MELLITUS, TYPE II   . DIVERTICULOSIS, COLON   . ERECTILE DYSFUNCTION, ORGANIC   . GOUT NOS   . Headache   . HYPERLIPIDEMIA   . HYPERTENSION   . LOW BACK PAIN   . Morbid obesity ([place])   . SLEEP APNEA, OBSTRUCTIVE    USES CPAP   Past Surgical History:  Procedure Laterality Date  . APPENDECTOMY    . FOOT SURGERY     LT  . TENOTOMY / FLEXOR TENDON TRANSFER Right 03/15/2015   Procedure: TENOTOMY HT REPAIR/ULCER DEBRIDEMENT/GRAFT PREP/ACELL GRAFT APPLICATION;  Surgeon: Jana Half, DPM;  Location: [place];  Service: Podiatry;  Laterality: Right;  HALLUX    reports that he has never smoked. He has never used smokeless tobacco. He reports previous alcohol use of about 2.0 standard drinks of alcohol per week. He reports that he does not use drugs. family history includes Asthma in an other family member; Cardiomyopathy in his father. Allergies  Allergen Reactions  . Penicillins Other (See Comments)    Unknown childhood allergic reaction Has  patient had a PCN reaction causing immediate rash, facial/tongue/throat swelling, SOB or lightheadedness with hypotension: Unknown Has patient had a PCN reaction causing severe rash involving mucus membranes or skin necrosis: Unknown Has patient had a PCN reaction that required hospitalization: No Has patient had a PCN reaction occurring within the last 10 years: No If all of the above answers are "NO", then may proceed with Cephalosporin use.   . Tizanidine Other (See Comments)   Current Outpatient Medications on File Prior to Visit  Medication Sig Dispense Refill  . Cholecalciferol (THERA-D 2000) 50 MCG (2000 UT) TABS 1 tab by mouth once daily 90 tablet 3  . diclofenac (VOLTAREN) 50 MG EC tablet Take 1 tablet (50 mg total) by mouth 2 (two) times daily. 20 tablet 0  . indomethacin (INDOCIN) 50 MG capsule TAKE 1 CAPSULE (50 MG TOTAL) BY MOUTH 3 (THREE) TIMES DAILY AS NEEDED. 90 capsule 1  . sildenafil (VIAGRA) 100 MG tablet Take 0.5-1 tablets (50-100 mg total) by mouth daily as needed for erectile dysfunction. 5 tablet 11  . vitamin B-12 (CYANOCOBALAMIN) 1000 MCG tablet Take 1 tablet (1,000 mcg total) by mouth daily. 90 tablet 3   No current facility-administered medications on file prior to visit.   Review of Systems All otherwise neg per pt     Objective:   Physical Exam BP (!) 160/100 (BP Location: Left  Arm, Patient Position: Sitting, Cuff Size: Large)   Pulse (!) 113   Temp 98 F (36.7 C) (Oral)   Wt (!) 384 lb (174.2 kg)   SpO2 97%   BMI 52.08 kg/m . VS noted,  Constitutional: Pt appears in NAD HENT: Head: NCAT.  Right Ear: External ear normal.  Left Ear: External ear normal.  Eyes: . Pupils are equal, round, and reactive to light. Conjunctivae and EOM are normal Nose: without d/c or deformity Neck: Neck supple. Gross normal ROM Cardiovascular: Normal rate and regular rhythm.   Pulmonary/Chest: Effort normal and breath sounds without rales or wheezing.  Abd:  Soft, NT,  ND, + BS, no organomegaly Neurological: Pt is alert. At baseline orientation, motor grossly intact Skin: Skin is warm. No rashes, other new lesions, chronic 1+ LE edema Psychiatric: Pt behavior is normal without agitation  All otherwise neg per pt Lab Results  Component Value Date   WBC 8.2 12/18/2019   HGB 11.6 (L) 12/18/2019   HCT 34.2 (L) 12/18/2019   PLT 192.0 12/18/2019   GLUCOSE 135 (H) 06/27/2020   CHOL 170 06/27/2020   TRIG 166.0 (H) 06/27/2020   HDL 43.90 06/27/2020   LDLDIRECT 83.0 12/19/2018   LDLCALC 93 06/27/2020   ALT 11 06/27/2020   AST 11 06/27/2020   NA 138 06/27/2020   K 5.3 No hemolysis seen (H) 06/27/2020   CL 104 06/27/2020   CREATININE 1.57 (H) 06/27/2020   BUN 28 (H) 06/27/2020   CO2 27 06/27/2020   TSH 2.23 12/18/2019   PSA 0.46 12/18/2019   INR 1.07 02/25/2018   HGBA1C 7.0 (H) 06/27/2020   MICROALBUR 312.3 (H) 12/18/2019      Assessment & Plan:

## 2020-07-26 ENCOUNTER — Ambulatory Visit (INDEPENDENT_AMBULATORY_CARE_PROVIDER_SITE_OTHER)
Admission: EM | Admit: 2020-07-26 | Discharge: 2020-07-26 | Disposition: A | Discharge status: Home / Self Care | Payer: Medicare HMO | Source: Home / Self Care

## 2020-07-26 ENCOUNTER — Other Ambulatory Visit: Payer: Self-pay

## 2020-07-26 ENCOUNTER — Emergency Department (HOSPITAL_COMMUNITY): Payer: Medicare HMO

## 2020-07-26 ENCOUNTER — Encounter (HOSPITAL_COMMUNITY): Payer: Self-pay | Admitting: Emergency Medicine

## 2020-07-26 ENCOUNTER — Inpatient Hospital Stay (HOSPITAL_COMMUNITY): Payer: Medicare HMO

## 2020-07-26 ENCOUNTER — Encounter: Payer: Self-pay | Admitting: *Deleted

## 2020-07-26 ENCOUNTER — Inpatient Hospital Stay (HOSPITAL_COMMUNITY)
Admission: EM | Admit: 2020-07-26 | Discharge: 2020-08-09 | DRG: 871 | Disposition: A | Discharge status: Skilled Nursing Facility | Payer: Medicare HMO | Attending: Family Medicine | Admitting: Family Medicine

## 2020-07-26 DIAGNOSIS — A419 Sepsis, unspecified organism: Secondary | ICD-10-CM | POA: Diagnosis not present

## 2020-07-26 DIAGNOSIS — M19072 Primary osteoarthritis, left ankle and foot: Secondary | ICD-10-CM | POA: Diagnosis not present

## 2020-07-26 DIAGNOSIS — T380X5A Adverse effect of glucocorticoids and synthetic analogues, initial encounter: Secondary | ICD-10-CM | POA: Diagnosis not present

## 2020-07-26 DIAGNOSIS — Z888 Allergy status to other drugs, medicaments and biological substances status: Secondary | ICD-10-CM

## 2020-07-26 DIAGNOSIS — I890 Lymphedema, not elsewhere classified: Secondary | ICD-10-CM | POA: Diagnosis present

## 2020-07-26 DIAGNOSIS — R0602 Shortness of breath: Secondary | ICD-10-CM | POA: Diagnosis not present

## 2020-07-26 DIAGNOSIS — M545 Low back pain, unspecified: Secondary | ICD-10-CM | POA: Diagnosis present

## 2020-07-26 DIAGNOSIS — Z8719 Personal history of other diseases of the digestive system: Secondary | ICD-10-CM

## 2020-07-26 DIAGNOSIS — E785 Hyperlipidemia, unspecified: Secondary | ICD-10-CM | POA: Diagnosis present

## 2020-07-26 DIAGNOSIS — M109 Gout, unspecified: Secondary | ICD-10-CM | POA: Diagnosis present

## 2020-07-26 DIAGNOSIS — E1122 Type 2 diabetes mellitus with diabetic chronic kidney disease: Secondary | ICD-10-CM | POA: Diagnosis not present

## 2020-07-26 DIAGNOSIS — I130 Hypertensive heart and chronic kidney disease with heart failure and stage 1 through stage 4 chronic kidney disease, or unspecified chronic kidney disease: Secondary | ICD-10-CM | POA: Diagnosis not present

## 2020-07-26 DIAGNOSIS — N179 Acute kidney failure, unspecified: Secondary | ICD-10-CM | POA: Diagnosis not present

## 2020-07-26 DIAGNOSIS — I4891 Unspecified atrial fibrillation: Secondary | ICD-10-CM | POA: Diagnosis present

## 2020-07-26 DIAGNOSIS — I4892 Unspecified atrial flutter: Secondary | ICD-10-CM

## 2020-07-26 DIAGNOSIS — N189 Chronic kidney disease, unspecified: Secondary | ICD-10-CM | POA: Diagnosis not present

## 2020-07-26 DIAGNOSIS — Z825 Family history of asthma and other chronic lower respiratory diseases: Secondary | ICD-10-CM

## 2020-07-26 DIAGNOSIS — N1831 Chronic kidney disease, stage 3a: Secondary | ICD-10-CM | POA: Diagnosis not present

## 2020-07-26 DIAGNOSIS — I517 Cardiomegaly: Secondary | ICD-10-CM | POA: Diagnosis not present

## 2020-07-26 DIAGNOSIS — E1169 Type 2 diabetes mellitus with other specified complication: Secondary | ICD-10-CM | POA: Diagnosis not present

## 2020-07-26 DIAGNOSIS — L0390 Cellulitis, unspecified: Secondary | ICD-10-CM | POA: Diagnosis not present

## 2020-07-26 DIAGNOSIS — M7989 Other specified soft tissue disorders: Secondary | ICD-10-CM

## 2020-07-26 DIAGNOSIS — R21 Rash and other nonspecific skin eruption: Secondary | ICD-10-CM | POA: Diagnosis not present

## 2020-07-26 DIAGNOSIS — I5032 Chronic diastolic (congestive) heart failure: Secondary | ICD-10-CM | POA: Diagnosis not present

## 2020-07-26 DIAGNOSIS — M1A0710 Idiopathic chronic gout, right ankle and foot, without tophus (tophi): Secondary | ICD-10-CM | POA: Diagnosis not present

## 2020-07-26 DIAGNOSIS — R9431 Abnormal electrocardiogram [ECG] [EKG]: Secondary | ICD-10-CM | POA: Diagnosis not present

## 2020-07-26 DIAGNOSIS — F1910 Other psychoactive substance abuse, uncomplicated: Secondary | ICD-10-CM | POA: Diagnosis present

## 2020-07-26 DIAGNOSIS — R5381 Other malaise: Secondary | ICD-10-CM | POA: Diagnosis not present

## 2020-07-26 DIAGNOSIS — Z6841 Body Mass Index (BMI) 40.0 and over, adult: Secondary | ICD-10-CM

## 2020-07-26 DIAGNOSIS — G8929 Other chronic pain: Secondary | ICD-10-CM | POA: Diagnosis present

## 2020-07-26 DIAGNOSIS — Z79899 Other long term (current) drug therapy: Secondary | ICD-10-CM

## 2020-07-26 DIAGNOSIS — I77810 Thoracic aortic ectasia: Secondary | ICD-10-CM | POA: Diagnosis not present

## 2020-07-26 DIAGNOSIS — R509 Fever, unspecified: Secondary | ICD-10-CM

## 2020-07-26 DIAGNOSIS — I10 Essential (primary) hypertension: Secondary | ICD-10-CM | POA: Diagnosis not present

## 2020-07-26 DIAGNOSIS — L03116 Cellulitis of left lower limb: Secondary | ICD-10-CM | POA: Diagnosis present

## 2020-07-26 DIAGNOSIS — N1832 Chronic kidney disease, stage 3b: Secondary | ICD-10-CM | POA: Diagnosis present

## 2020-07-26 DIAGNOSIS — M170 Bilateral primary osteoarthritis of knee: Secondary | ICD-10-CM | POA: Diagnosis present

## 2020-07-26 DIAGNOSIS — N529 Male erectile dysfunction, unspecified: Secondary | ICD-10-CM | POA: Diagnosis present

## 2020-07-26 DIAGNOSIS — Z7984 Long term (current) use of oral hypoglycemic drugs: Secondary | ICD-10-CM | POA: Diagnosis not present

## 2020-07-26 DIAGNOSIS — Z8249 Family history of ischemic heart disease and other diseases of the circulatory system: Secondary | ICD-10-CM

## 2020-07-26 DIAGNOSIS — G4733 Obstructive sleep apnea (adult) (pediatric): Secondary | ICD-10-CM | POA: Diagnosis not present

## 2020-07-26 DIAGNOSIS — E119 Type 2 diabetes mellitus without complications: Secondary | ICD-10-CM | POA: Diagnosis not present

## 2020-07-26 DIAGNOSIS — Z880 Allergy status to penicillin: Secondary | ICD-10-CM

## 2020-07-26 DIAGNOSIS — Z7401 Bed confinement status: Secondary | ICD-10-CM | POA: Diagnosis not present

## 2020-07-26 DIAGNOSIS — I484 Atypical atrial flutter: Secondary | ICD-10-CM | POA: Diagnosis not present

## 2020-07-26 DIAGNOSIS — R000 Tachycardia, unspecified: Secondary | ICD-10-CM | POA: Diagnosis not present

## 2020-07-26 DIAGNOSIS — R609 Edema, unspecified: Secondary | ICD-10-CM | POA: Diagnosis not present

## 2020-07-26 DIAGNOSIS — N170 Acute kidney failure with tubular necrosis: Secondary | ICD-10-CM | POA: Diagnosis not present

## 2020-07-26 DIAGNOSIS — Z20822 Contact with and (suspected) exposure to covid-19: Secondary | ICD-10-CM | POA: Diagnosis not present

## 2020-07-26 DIAGNOSIS — I509 Heart failure, unspecified: Secondary | ICD-10-CM | POA: Diagnosis not present

## 2020-07-26 DIAGNOSIS — N183 Chronic kidney disease, stage 3 unspecified: Secondary | ICD-10-CM | POA: Diagnosis not present

## 2020-07-26 DIAGNOSIS — E1151 Type 2 diabetes mellitus with diabetic peripheral angiopathy without gangrene: Secondary | ICD-10-CM | POA: Diagnosis present

## 2020-07-26 DIAGNOSIS — D631 Anemia in chronic kidney disease: Secondary | ICD-10-CM | POA: Diagnosis present

## 2020-07-26 DIAGNOSIS — M25462 Effusion, left knee: Secondary | ICD-10-CM | POA: Diagnosis not present

## 2020-07-26 DIAGNOSIS — M2550 Pain in unspecified joint: Secondary | ICD-10-CM | POA: Diagnosis not present

## 2020-07-26 DIAGNOSIS — G709 Myoneural disorder, unspecified: Secondary | ICD-10-CM | POA: Diagnosis present

## 2020-07-26 DIAGNOSIS — I493 Ventricular premature depolarization: Secondary | ICD-10-CM | POA: Diagnosis not present

## 2020-07-26 DIAGNOSIS — E669 Obesity, unspecified: Secondary | ICD-10-CM | POA: Diagnosis present

## 2020-07-26 DIAGNOSIS — M609 Myositis, unspecified: Secondary | ICD-10-CM | POA: Diagnosis not present

## 2020-07-26 DIAGNOSIS — L538 Other specified erythematous conditions: Secondary | ICD-10-CM

## 2020-07-26 DIAGNOSIS — R519 Headache, unspecified: Secondary | ICD-10-CM | POA: Diagnosis present

## 2020-07-26 DIAGNOSIS — E871 Hypo-osmolality and hyponatremia: Secondary | ICD-10-CM | POA: Diagnosis not present

## 2020-07-26 DIAGNOSIS — D72829 Elevated white blood cell count, unspecified: Secondary | ICD-10-CM | POA: Diagnosis not present

## 2020-07-26 DIAGNOSIS — I129 Hypertensive chronic kidney disease with stage 1 through stage 4 chronic kidney disease, or unspecified chronic kidney disease: Secondary | ICD-10-CM | POA: Diagnosis not present

## 2020-07-26 DIAGNOSIS — I480 Paroxysmal atrial fibrillation: Secondary | ICD-10-CM

## 2020-07-26 DIAGNOSIS — R600 Localized edema: Secondary | ICD-10-CM | POA: Diagnosis not present

## 2020-07-26 DIAGNOSIS — E66813 Obesity, class 3: Secondary | ICD-10-CM | POA: Diagnosis present

## 2020-07-26 DIAGNOSIS — E109 Type 1 diabetes mellitus without complications: Secondary | ICD-10-CM | POA: Diagnosis not present

## 2020-07-26 LAB — HEPATIC FUNCTION PANEL
ALT: 22 U/L (ref 0–44)
AST: 21 U/L (ref 15–41)
Albumin: 2.7 g/dL — ABNORMAL LOW (ref 3.5–5.0)
Alkaline Phosphatase: 87 U/L (ref 38–126)
Bilirubin, Direct: 0.4 mg/dL — ABNORMAL HIGH (ref 0.0–0.2)
Indirect Bilirubin: 0.9 mg/dL (ref 0.3–0.9)
Total Bilirubin: 1.3 mg/dL — ABNORMAL HIGH (ref 0.3–1.2)
Total Protein: 6.9 g/dL (ref 6.5–8.1)

## 2020-07-26 LAB — LACTIC ACID, PLASMA
Lactic Acid, Venous: 2.1 mmol/L (ref 0.5–1.9)
Lactic Acid, Venous: 1.5 mmol/L (ref 0.5–1.9)
Lactic Acid, Venous: 1.3 mmol/L (ref 0.5–1.9)

## 2020-07-26 LAB — RESPIRATORY PANEL BY RT PCR (FLU A&B, COVID)
Influenza A by PCR: NEGATIVE
Influenza B by PCR: NEGATIVE
SARS Coronavirus 2 by RT PCR: NEGATIVE

## 2020-07-26 LAB — APTT: aPTT: 41 seconds — ABNORMAL HIGH (ref 24–36)

## 2020-07-26 LAB — CBC
HCT: 35.9 % — ABNORMAL LOW (ref 39.0–52.0)
Hemoglobin: 11.7 g/dL — ABNORMAL LOW (ref 13.0–17.0)
MCH: 30.4 pg (ref 26.0–34.0)
MCHC: 32.6 g/dL (ref 30.0–36.0)
MCV: 93.2 fL (ref 80.0–100.0)
Platelets: 304 10*3/uL (ref 150–400)
RBC: 3.85 MIL/uL — ABNORMAL LOW (ref 4.22–5.81)
RDW: 14.3 % (ref 11.5–15.5)
WBC: 18.4 10*3/uL — ABNORMAL HIGH (ref 4.0–10.5)
nRBC: 0 % (ref 0.0–0.2)

## 2020-07-26 LAB — BASIC METABOLIC PANEL
Anion gap: 13 (ref 5–15)
BUN: 35 mg/dL — ABNORMAL HIGH (ref 8–23)
CO2: 21 mmol/L — ABNORMAL LOW (ref 22–32)
Calcium: 8.8 mg/dL — ABNORMAL LOW (ref 8.9–10.3)
Chloride: 101 mmol/L (ref 98–111)
Creatinine, Ser: 2.6 mg/dL — ABNORMAL HIGH (ref 0.61–1.24)
GFR, Estimated: 27 mL/min — ABNORMAL LOW (ref >60)
Glucose, Bld: 179 mg/dL — ABNORMAL HIGH (ref 70–99)
Potassium: 4 mmol/L (ref 3.5–5.1)
Sodium: 135 mmol/L (ref 135–145)

## 2020-07-26 LAB — HIV ANTIBODY (ROUTINE TESTING W REFLEX): HIV Screen 4th Generation wRfx: NONREACTIVE

## 2020-07-26 LAB — PROTIME-INR
INR: 1.4 — ABNORMAL HIGH (ref 0.8–1.2)
Prothrombin Time: 16.2 seconds — ABNORMAL HIGH (ref 11.4–15.2)

## 2020-07-26 LAB — BRAIN NATRIURETIC PEPTIDE: B Natriuretic Peptide: 296.5 pg/mL — ABNORMAL HIGH (ref 0.0–100.0)

## 2020-07-26 LAB — PROCALCITONIN: Procalcitonin: 1.64 ng/mL

## 2020-07-26 LAB — TROPONIN I (HIGH SENSITIVITY): Troponin I (High Sensitivity): 28 ng/L — ABNORMAL HIGH (ref <18)

## 2020-07-26 LAB — GLUCOSE, CAPILLARY: Glucose-Capillary: 190 mg/dL — ABNORMAL HIGH (ref 70–99)

## 2020-07-26 LAB — MAGNESIUM: Magnesium: 1.7 mg/dL (ref 1.7–2.4)

## 2020-07-26 MED ORDER — SODIUM CHLORIDE 0.9% FLUSH
3.0000 mL | Freq: Two times a day (BID) | INTRAVENOUS | Status: DC
  Administered 2020-07-30 – 2020-08-04 (×10): 3 mL via INTRAVENOUS
  Administered 2020-08-05: 2.5 mL via INTRAVENOUS
  Administered 2020-08-06 – 2020-08-09 (×7): 3 mL via INTRAVENOUS

## 2020-07-26 MED ORDER — ENOXAPARIN SODIUM 100 MG/ML ~~LOC~~ SOLN
85.0000 mg | SUBCUTANEOUS | Status: DC
  Administered 2020-07-26 – 2020-08-01 (×7): 85 mg via SUBCUTANEOUS
  Filled 2020-07-26 (×5): qty 1
  Filled 2020-07-26: qty 0.85
  Filled 2020-07-26 (×2): qty 1

## 2020-07-26 MED ORDER — VANCOMYCIN HCL IN DEXTROSE 1-5 GM/200ML-% IV SOLN: 1000.0000 mg | Freq: Once | INTRAVENOUS | Status: DC

## 2020-07-26 MED ORDER — HYDRALAZINE HCL 20 MG/ML IJ SOLN
5.0000 mg | INTRAMUSCULAR | Status: DC | PRN
  Administered 2020-08-05 (×2): 5 mg via INTRAVENOUS
  Filled 2020-07-26 (×3): qty 1

## 2020-07-26 MED ORDER — LACTATED RINGERS IV BOLUS (SEPSIS)
500.0000 mL | Freq: Once | INTRAVENOUS | Status: AC
  Administered 2020-07-26: 500 mL via INTRAVENOUS

## 2020-07-26 MED ORDER — METRONIDAZOLE 500 MG PO TABS
500.0000 mg | ORAL_TABLET | Freq: Three times a day (TID) | ORAL | Status: DC
  Administered 2020-07-26 – 2020-07-28 (×7): 500 mg via ORAL
  Filled 2020-07-26 (×7): qty 1

## 2020-07-26 MED ORDER — ACETAMINOPHEN 325 MG PO TABS
650.0000 mg | ORAL_TABLET | Freq: Four times a day (QID) | ORAL | Status: DC | PRN
  Administered 2020-07-28 – 2020-08-05 (×11): 650 mg via ORAL
  Filled 2020-07-26 (×14): qty 2

## 2020-07-26 MED ORDER — CLONIDINE HCL 0.3 MG PO TABS
0.3000 mg | ORAL_TABLET | Freq: Three times a day (TID) | ORAL | Status: DC
  Administered 2020-07-26 – 2020-07-31 (×17): 0.3 mg via ORAL
  Filled 2020-07-26 (×18): qty 1

## 2020-07-26 MED ORDER — AMLODIPINE BESYLATE 10 MG PO TABS
10.0000 mg | ORAL_TABLET | Freq: Every day | ORAL | Status: DC
  Administered 2020-07-27 – 2020-07-31 (×5): 10 mg via ORAL
  Filled 2020-07-26 (×5): qty 1

## 2020-07-26 MED ORDER — SODIUM CHLORIDE 0.9 % IV SOLN
2.0000 g | Freq: Two times a day (BID) | INTRAVENOUS | Status: DC
  Administered 2020-07-27 – 2020-07-30 (×7): 2 g via INTRAVENOUS
  Filled 2020-07-26 (×7): qty 2

## 2020-07-26 MED ORDER — DILTIAZEM HCL 25 MG/5ML IV SOLN: 10.0000 mg | Freq: Once | INTRAVENOUS | Status: DC

## 2020-07-26 MED ORDER — INSULIN ASPART 100 UNIT/ML ~~LOC~~ SOLN
0.0000 [IU] | Freq: Every day | SUBCUTANEOUS | Status: DC
  Administered 2020-07-31: 2 [IU] via SUBCUTANEOUS
  Administered 2020-08-01: 4 [IU] via SUBCUTANEOUS
  Administered 2020-08-02 – 2020-08-04 (×3): 3 [IU] via SUBCUTANEOUS
  Administered 2020-08-05 – 2020-08-06 (×2): 2 [IU] via SUBCUTANEOUS

## 2020-07-26 MED ORDER — ATORVASTATIN CALCIUM 10 MG PO TABS
10.0000 mg | ORAL_TABLET | Freq: Every day | ORAL | Status: DC
  Administered 2020-07-27 – 2020-08-09 (×14): 10 mg via ORAL
  Filled 2020-07-26 (×14): qty 1

## 2020-07-26 MED ORDER — OXYCODONE HCL 5 MG PO TABS
5.0000 mg | ORAL_TABLET | ORAL | Status: DC | PRN
  Administered 2020-07-28 – 2020-08-09 (×15): 5 mg via ORAL
  Filled 2020-07-26 (×15): qty 1

## 2020-07-26 MED ORDER — DOCUSATE SODIUM 100 MG PO CAPS
100.0000 mg | ORAL_CAPSULE | Freq: Two times a day (BID) | ORAL | Status: DC
  Administered 2020-07-26 – 2020-07-31 (×11): 100 mg via ORAL
  Filled 2020-07-26 (×11): qty 1

## 2020-07-26 MED ORDER — ONDANSETRON HCL 4 MG/2ML IJ SOLN: 4.0000 mg | Freq: Four times a day (QID) | INTRAMUSCULAR | Status: DC | PRN

## 2020-07-26 MED ORDER — LEVOFLOXACIN IN D5W 750 MG/150ML IV SOLN: 750.0000 mg | Freq: Once | INTRAVENOUS | Status: DC

## 2020-07-26 MED ORDER — INSULIN ASPART 100 UNIT/ML ~~LOC~~ SOLN
0.0000 [IU] | Freq: Three times a day (TID) | SUBCUTANEOUS | Status: DC
  Administered 2020-07-27: 3 [IU] via SUBCUTANEOUS
  Administered 2020-07-27 (×2): 2 [IU] via SUBCUTANEOUS
  Administered 2020-07-28 – 2020-07-30 (×9): 3 [IU] via SUBCUTANEOUS
  Administered 2020-07-31: 5 [IU] via SUBCUTANEOUS
  Administered 2020-07-31: 2 [IU] via SUBCUTANEOUS
  Administered 2020-07-31: 3 [IU] via SUBCUTANEOUS
  Administered 2020-08-01 (×2): 5 [IU] via SUBCUTANEOUS
  Administered 2020-08-01: 3 [IU] via SUBCUTANEOUS
  Administered 2020-08-02: 11 [IU] via SUBCUTANEOUS
  Administered 2020-08-02 – 2020-08-03 (×3): 5 [IU] via SUBCUTANEOUS
  Administered 2020-08-03 – 2020-08-04 (×2): 8 [IU] via SUBCUTANEOUS
  Administered 2020-08-04: 5 [IU] via SUBCUTANEOUS
  Administered 2020-08-04: 8 [IU] via SUBCUTANEOUS
  Administered 2020-08-05: 15 [IU] via SUBCUTANEOUS
  Administered 2020-08-05 – 2020-08-06 (×4): 5 [IU] via SUBCUTANEOUS
  Administered 2020-08-06: 8 [IU] via SUBCUTANEOUS
  Administered 2020-08-07 – 2020-08-08 (×4): 3 [IU] via SUBCUTANEOUS
  Administered 2020-08-08: 5 [IU] via SUBCUTANEOUS
  Administered 2020-08-08: 3 [IU] via SUBCUTANEOUS
  Administered 2020-08-09: 2 [IU] via SUBCUTANEOUS
  Administered 2020-08-09: 3 [IU] via SUBCUTANEOUS

## 2020-07-26 MED ORDER — SODIUM CHLORIDE 0.9 % IV SOLN
2.0000 g | Freq: Once | INTRAVENOUS | Status: AC
  Administered 2020-07-26: 2 g via INTRAVENOUS
  Filled 2020-07-26: qty 2

## 2020-07-26 MED ORDER — VANCOMYCIN HCL 1500 MG/300ML IV SOLN: 1500.0000 mg | INTRAVENOUS | Status: DC

## 2020-07-26 MED ORDER — LACTATED RINGERS IV SOLN: INTRAVENOUS | Status: DC

## 2020-07-26 MED ORDER — VANCOMYCIN HCL 2000 MG/400ML IV SOLN
2000.0000 mg | Freq: Once | INTRAVENOUS | Status: AC
  Administered 2020-07-26: 2000 mg via INTRAVENOUS
  Filled 2020-07-26: qty 400

## 2020-07-26 MED ORDER — ACETAMINOPHEN 650 MG RE SUPP: 650.0000 mg | Freq: Four times a day (QID) | RECTAL | Status: DC | PRN

## 2020-07-26 MED ORDER — ONDANSETRON HCL 4 MG PO TABS: 4.0000 mg | ORAL_TABLET | Freq: Four times a day (QID) | ORAL | Status: DC | PRN

## 2020-07-26 NOTE — ED Notes (Addendum)
Attempted to call report. RN in another pt room at this time and RN will call back when able to.

## 2020-07-26 NOTE — ED Provider Notes (Signed)
[facility] Provider Note   CSN: [id number] Arrival date & time: 07/26/20  1304     History Chief Complaint  Patient presents with  . Shortness of Breath  . Leg Swelling    [facility] is a 64 y.o. male who was sent in from [facility] for Aflutter and tachycardia. He has no previous hx of the same.  Review of the EKG sent over from urgent care shows a computer interpretation of a flutter with with a 2-1 AV conduction however there are people to be very consistent QRS and T waves which suggests sinus tachycardia (please see picture below) and I do not believe the patient was in atrial flutter prior to arrival.  Patient states he has a history of peripheral edema and has had very significant swelling bilaterally in the lower extremities way out of proportion to normal for the past few days.  He states that he has gotten exertional dyspnea and has been taking 80 mg of Lasix without improvement in his symptoms.  He denies any chest pain.  He states that he has felt winded, weak and tired and that he has had swelling like this in the past when he has had cellulitis.  He states that his left lower extremity is painful, hot and red consistent with previous episodes of cellulitis.  He denies fever or chills.      HPI     Past Medical History:  Diagnosis Date  . Arthritis    IN KNEES  . Cellulitis 10/26/2013   LOWER RT EXTREMITY  . CELLULITIS/ABSCESS, LEG 06/27/2007  . COLONIC POLYPS   . CONGESTIVE HEART FAILURE   . DIABETES MELLITUS, TYPE II   . DIVERTICULOSIS, COLON   . ERECTILE DYSFUNCTION, ORGANIC   . GOUT NOS   . Headache   . HYPERLIPIDEMIA   . HYPERTENSION   . LOW BACK PAIN   . Morbid obesity ([place])   . SLEEP APNEA, OBSTRUCTIVE    USES CPAP    Patient Active Problem List   Diagnosis Date Noted  . Stage 3a chronic kidney disease ([place]) 07/04/2020  . Vitamin D deficiency 12/28/2019  . B12 deficiency 12/28/2019  . Glaucoma 12/22/2018  .  Radiculopathy, lumbar region 01/17/2018  . Constipation 06/18/2017  . AKI (acute kidney injury) ([place]) 06/02/2017  . Chronic midline low back pain without sciatica 05/14/2017  . Proteinuria 11/22/2016  . Skin lesion 11/12/2015  . Preop exam for internal medicine 01/16/2015  . Cellulitis and abscess of leg 10/26/2013  . DM foot ulcer ([place]) 10/23/2013  . Encounter for well adult exam with abnormal findings 07/05/2011  . History of colonic polyps 09/05/2009  . COLONIC POLYPS 03/14/2008  . DIVERTICULOSIS, COLON 03/14/2008  . Hyperlipidemia 06/27/2007  . CELLULITIS/ABSCESS, LEG 06/27/2007  . Diabetes ([place]) 05/21/2007  . GOUT NOS 05/21/2007  . Morbid obesity ([place]) 05/21/2007  . ERECTILE DYSFUNCTION 05/21/2007  . OSA (obstructive sleep apnea) 05/21/2007  . Essential hypertension 05/21/2007    Past Surgical History:  Procedure Laterality Date  . APPENDECTOMY    . FOOT SURGERY     LT  . TENOTOMY / FLEXOR TENDON TRANSFER Right 03/15/2015   Procedure: TENOTOMY HT REPAIR/ULCER DEBRIDEMENT/GRAFT PREP/ACELL GRAFT APPLICATION;  Surgeon: Jana Half, DPM;  Location: [place];  Service: Podiatry;  Laterality: Right;  HALLUX       Family History  Problem Relation Age of Onset  . Cardiomyopathy Father   . Asthma Other     Social History  Tobacco Use  . Smoking status: Never Smoker  . Smokeless tobacco: Never Used  Vaping Use  . Vaping Use: Never used  Substance Use Topics  . Alcohol use: Not Currently    Alcohol/week: 2.0 standard drinks    Types: 2 Cans of beer per week    Comment: pt stopped drinking 1 week ago  . Drug use: No    Home Medications Prior to Admission medications   Medication Sig Start Date End Date Taking? Authorizing Provider  amLODipine (NORVASC) 10 MG tablet Take 1 tablet (10 mg total) by mouth daily. 07/04/20   Biagio Borg, MD  atorvastatin (LIPITOR) 10 MG tablet TAKE 1 TABLET (10 MG TOTAL) BY MOUTH DAILY. 07/04/20   Biagio Borg, MD  Cholecalciferol  (THERA-D 2000) 50 MCG (2000 UT) TABS 1 tab by mouth once daily 06/28/20   Biagio Borg, MD  cloNIDine (CATAPRES) 0.3 MG tablet Take 0.3 mg by mouth in the morning, at noon, and at bedtime.    [provider]  diclofenac (VOLTAREN) 50 MG EC tablet Take 1 tablet (50 mg total) by mouth 2 (two) times daily. 12/19/19   Tasia Catchings, Amy V, PA-C  furosemide (LASIX) 80 MG tablet Take 1 tablet (80 mg total) by mouth daily as needed. 07/04/20   Biagio Borg, MD  glipiZIDE (GLUCOTROL XL) 5 MG 24 hr tablet TAKE 1 TABLET BY MOUTH EVERY DAY WITH BREAKFAST 07/04/20   Biagio Borg, MD  indomethacin (INDOCIN) 50 MG capsule TAKE 1 CAPSULE (50 MG TOTAL) BY MOUTH 3 (THREE) TIMES DAILY AS NEEDED. 02/04/20   Biagio Borg, MD  metFORMIN (GLUCOPHAGE) 500 MG tablet Take 2 tablets (1,000 mg total) by mouth 2 (two) times daily with a meal. 07/04/20   Biagio Borg, MD  potassium chloride SA (KLOR-CON M20) 20 MEQ tablet Take 1 tablet (20 mEq total) by mouth daily. 07/04/20   Biagio Borg, MD  sildenafil (VIAGRA) 100 MG tablet Take 0.5-1 tablets (50-100 mg total) by mouth daily as needed for erectile dysfunction. 12/17/17   Biagio Borg, MD  telmisartan (MICARDIS) 80 MG tablet Take 1 tablet (80 mg total) by mouth daily. 07/04/20   Biagio Borg, MD  vitamin B-12 (CYANOCOBALAMIN) 1000 MCG tablet Take 1 tablet (1,000 mcg total) by mouth daily. 06/28/20   Biagio Borg, MD    Allergies    Penicillins and Tizanidine  Review of Systems   Review of Systems Ten systems reviewed and are negative for acute change, except as noted in the HPI.   Physical Exam Updated Vital Signs BP 122/73 (BP Location: Right Arm)   Pulse (!) 122   Temp 99.1 F (37.3 C) (Oral)   Resp (!) 24   SpO2 98%   Physical Exam Vitals and nursing note reviewed.  Constitutional:      General: He is not in acute distress.    Appearance: He is well-developed. He is not diaphoretic.  HENT:     Head: Normocephalic and atraumatic.  Eyes:     General:  No scleral icterus.    Conjunctiva/sclera: Conjunctivae normal.  Cardiovascular:     Rate and Rhythm: Regular rhythm. Tachycardia present.     Heart sounds: Normal heart sounds.  Pulmonary:     Effort: Pulmonary effort is normal. Tachypnea present. No respiratory distress.     Breath sounds: Normal breath sounds.  Abdominal:     Palpations: Abdomen is soft.     Tenderness: There is no abdominal  tenderness.  Musculoskeletal:     Cervical back: Normal range of motion and neck supple.     Right lower leg: 4+ Edema present.     Left lower leg: 3+ Pitting Edema present.  Skin:    General: Skin is warm and dry.  Neurological:     Mental Status: He is alert.  Psychiatric:        Behavior: Behavior normal.     ED Results / Procedures / Treatments   Labs (all labs ordered are listed, but only abnormal results are displayed) Labs Reviewed  BASIC METABOLIC PANEL  CBC  MAGNESIUM  TSH  BRAIN NATRIURETIC PEPTIDE  PROTIME-INR  APTT  TROPONIN I (HIGH SENSITIVITY)    EKG None    Radiology No results found.  Procedures .Critical Care Performed by: Margarita Mail, PA-C Authorized by: Margarita Mail, PA-C   Critical care provider statement:    Critical care time (minutes):  50   Critical care time was exclusive of:  Separately billable procedures and treating other patients   Critical care was necessary to treat or prevent imminent or life-threatening deterioration of the following conditions:  Sepsis   Critical care was time spent personally by me on the following activities:  Discussions with consultants, evaluation of patient's response to treatment, examination of patient, ordering and performing treatments and interventions, ordering and review of laboratory studies, ordering and review of radiographic studies, pulse oximetry, re-evaluation of patient's condition, obtaining history from patient or surrogate and review of old charts   (including critical care  time)  Medications Ordered in ED Medications  diltiazem (CARDIZEM) injection 10 mg (has no administration in time range)    ED Course  I have reviewed the triage vital signs and the nursing notes.  Pertinent labs & imaging results that were available during my care of the patient were reviewed by me and considered in my medical decision making (see chart for details).  Clinical Course as of Jul 26 1548  Fri Jul 26, 2020  1421    WBC(!): 18.4 [AH]    Clinical Course User Index [AH] Margarita Mail, PA-C   MDM Rules/Calculators/A&P     CHA2DS2-VASc Score: 4                     CC: weakness VS: BP (!) 129/114 (BP Location: Right Arm)   Pulse (!) 122   Temp 99.1 F (37.3 C) (Oral)   Resp (!) 28   SpO2 100%   GG:EZMOQHU is gathered by patient and emr. Previous records obtained and reviewed. DDX:The patient's complaint of weakness involves an extensive number of diagnostic and treatment options, and is a complaint that carries with it a high risk of complications, morbidity, and potential mortality. Given the large differential diagnosis, medical decision making is of high complexity. The differential diagnosis of weakness includes but is not limited to neurologic causes (GBS, myasthenia gravis, CVA, MS, ALS, transverse myelitis, spinal cord injury, CVA, botulism, ) and other causes: ACS, Arrhythmia, syncope, orthostatic hypotension, sepsis, hypoglycemia, electrolyte disturbance, hypothyroidism, respiratory failure, symptomatic anemia, dehydration, heat injury, polypharmacy, malignancy. DDX also includes cellulitis, stasis dermatitis, DVT less likely Labs: I ordered reviewed and interpreted labs which include CBC which shows a white blood cell count of 18.4 with a normocytic anemia at 11.7.  BMP positive for elevated creatinine and BUN.  Creatinine up to 2.6 from 1.57,65-month ago PT/INR elevated at 1.4, BNP elevated at 296.5 without baseline comparison.  Magnesium within normal limits,  hepatic  function panel shows a slightly lower albumin level, bili of 1.3.  Lactic acid at 2.1. Imaging: I ordered and reviewed images which included 2 view chest x-ray. I independently visualized and interpreted all imaging. There are no acute, significant findings on today's images. EKG:Marland Kitchen  Repeat EKG here in the emergency department shows sinus tachycardia at a rate of 125.  Consults: Hospitalist, Dr. Lorin Mercy MDM: Patient here with sepsis due to cellulitis.  He has been persistently tachycardic and tachypneic.  Blood pressures are stable.  Patient treated here with broad-spectrum antibiotics and fluids.  He also has an acute kidney injury.  He will be admitted to the hospitalist service. Patient disposition:The patient appears reasonably stabilized for admission considering the current resources, flow, and capabilities available in the ED at this time, and I doubt any other [facility] requiring further screening and/or treatment in the ED prior to admission.        Final Clinical Impression(s) / ED Diagnoses Final diagnoses:  None    Rx / DC Orders ED Discharge Orders         Ordered    Amb referral to [facility]        07/26/20 1346           Margarita Mail, PA-C 07/27/20 1251    Valarie Merino, MD 07/31/20 [phone]

## 2020-07-26 NOTE — Discharge Instructions (Addendum)
Go immediately to [facility] emergency department as you have new onset atrial flutter and left leg swelling which I suspect is either heart failure related or possible blood clot.  You need further evaluation in the setting of the emergency department.

## 2020-07-26 NOTE — ED Triage Notes (Signed)
[facility] from [facility] for c/o sob and swelling to Left leg, seen at urgent care and sent her for further eval for concern of possible heart failure/dvt rule out. Pt also found to be in a flutter, new onset. Rate 125 in triage. resp labored upon exertion.

## 2020-07-26 NOTE — Progress Notes (Signed)
elink monitoring for sepsis protocol 

## 2020-07-26 NOTE — ED Triage Notes (Signed)
Pt reports increased redness , swelling and pain to Lt lower leg. Pt reports leg swelling did not decrease after propping leg up over night . Pain Lt leg 8/10. Pain to Lt posterior leg increases when pressure is applied to skin.

## 2020-07-26 NOTE — Progress Notes (Signed)
Pharmacy Antibiotic Note  [facility] is a 64 y.o. male admitted on 07/26/2020 with sepsis and cellulitis.  Pharmacy has been consulted for Cefepime and Vancomycin dosing.      Temp (24hrs), Avg:98.9 F (37.2 C), Min:98.6 F (37 C), Max:99.1 F (37.3 C)  Recent Labs  Lab 07/26/20 1329 07/26/20 1450  WBC 18.4*  --   CREATININE 2.60*  --   LATICACIDVEN  --  2.1*    Estimated Creatinine Clearance: 46.6 mL/min (A) (by C-G formula based on SCr of 2.6 mg/dL (H)).    Allergies  Allergen Reactions  . Penicillins Other (See Comments)    Unknown childhood allergic reaction Has patient had a PCN reaction causing immediate rash, facial/tongue/throat swelling, SOB or lightheadedness with hypotension: Unknown Has patient had a PCN reaction causing severe rash involving mucus membranes or skin necrosis: Unknown Has patient had a PCN reaction that required hospitalization: No Has patient had a PCN reaction occurring within the last 10 years: No If all of the above answers are "NO", then may proceed with Cephalosporin use.   . Tizanidine Other (See Comments)    07/26/20: Pt does not recognize drug and does not remember being allergic to it.    Antimicrobials this admission: 11/19 Cefepime >>  11/19 Vancomycin >>   Dose adjustments this admission: N/a  Microbiology results: Pending   Plan:  - Cefepime 2g IV q12h - Vancomycin 2000mg  IV x 1 dose  - Followed by Vancomycin 1500mg  IV q24h - Est Calc AUC 568 - Monitor patients renal function and urine output  - De-escalate ABX when appropriate   Thank you for allowing pharmacy to be a part of this patient's care.  Duanne Limerick PharmD. BCPS 07/26/2020 4:05 PM

## 2020-07-26 NOTE — ED Notes (Signed)
Spoke to main Pharmacy regarding pt's clonidine order and pyxis machine. Pharmacy to tube medication to ED for administration.

## 2020-07-26 NOTE — ED Notes (Signed)
CRITICAL VALUE ALERT  Critical Value:  Lactic acid 2.1  Date & Time Notied:  07/26/20 1536  Provider Notified: Dr. Lorin Mercy

## 2020-07-26 NOTE — H&P (Signed)
History and Physical    Gerald Day SJG:[id number] DOB: 1956-06-14 DOA: 07/26/2020  PCP: Biagio Borg, MD Consultants:  Erlinda Hong - orthopedics; Ernestina Patches - PM&R Patient coming from: Home - lives alone; NOK: Jordani Nunn, son, [phone]  Chief Complaint: LLE cellulitis  HPI: Gerald Day is a 64 y.o. male with medical history significant of OSA on CPAP; morbid obesity (BMI 35); HTN; HLD; DM; CHF; and h/o RLE cellulitis presenting with LLE cellulitis.  He reports that he has been very SOB x 3 days.  His L leg is swollen.  He has h/o cellulitis and thought it was that again.  He does not feel like he has had fever.  SOB is with exertion. His leg has been red and painful on the back side.    ED Course:  Severe peripheral edema, worse x 3-4 days and more winded, tired.  Went to UC for probable cellulitis.  LLE cellulitis with AKI, creatinine 2.6 with last 1.57.  Intravascularly dry but marked 3rd spacing.  Sepsis.  Given Vanc, Cefepime.  DVT US pending but unlikely.   Review of Systems: As per HPI; otherwise review of systems reviewed and negative.   Ambulatory Status:  Ambulates without assistance  COVID Vaccine Status:   Complete  Past Medical History:  Diagnosis Date  . Arthritis    IN KNEES  . Cellulitis 10/26/2013   LOWER RT EXTREMITY  . CELLULITIS/ABSCESS, LEG 06/27/2007  . COLONIC POLYPS   . CONGESTIVE HEART FAILURE   . DIABETES MELLITUS, TYPE II   . DIVERTICULOSIS, COLON   . ERECTILE DYSFUNCTION, ORGANIC   . GOUT NOS   . Headache   . HYPERLIPIDEMIA   . HYPERTENSION   . LOW BACK PAIN   . Morbid obesity ([place])   . SLEEP APNEA, OBSTRUCTIVE    USES CPAP    Past Surgical History:  Procedure Laterality Date  . APPENDECTOMY    . FOOT SURGERY     LT  . TENOTOMY / FLEXOR TENDON TRANSFER Right 03/15/2015   Procedure: TENOTOMY HT REPAIR/ULCER DEBRIDEMENT/GRAFT PREP/ACELL GRAFT APPLICATION;  Surgeon: Jana Half, DPM;  Location: [facility];  Service: Podiatry;  Laterality:  Right;  HALLUX    Social History   Socioeconomic History  . Marital status: Married    Spouse name: Not on file  . Number of children: Not on file  . Years of education: Not on file  . Highest education level: Not on file  Occupational History  . Occupation: retired  Tobacco Use  . Smoking status: Never Smoker  . Smokeless tobacco: Never Used  Vaping Use  . Vaping Use: Never used  Substance and Sexual Activity  . Alcohol use: Not Currently    Alcohol/week: 3.0 - 4.0 standard drinks    Types: 3 - 4 Cans of beer per week    Comment: hardly any; h/o heavy use  . Drug use: No  . Sexual activity: Not on file  Other Topics Concern  . Not on file  Social History Narrative  . Not on file   Social Determinants of Health   Financial Resource Strain:   . Difficulty of Paying Living Expenses: Not on file  Food Insecurity:   . Worried About Charity fundraiser in the Last Year: Not on file  . Ran Out of Food in the Last Year: Not on file  Transportation Needs:   . Lack of Transportation (Medical): Not on file  . Lack of Transportation (Non-Medical): Not on file  Physical Activity:   . Days of Exercise per Week: Not on file  . Minutes of Exercise per Session: Not on file  Stress:   . Feeling of Stress : Not on file  Social Connections:   . Frequency of Communication with Friends and Family: Not on file  . Frequency of Social Gatherings with Friends and Family: Not on file  . Attends Religious Services: Not on file  . Active Member of Clubs or Organizations: Not on file  . Attends Archivist Meetings: Not on file  . Marital Status: Not on file  Intimate Partner Violence:   . Fear of Current or Ex-Partner: Not on file  . Emotionally Abused: Not on file  . Physically Abused: Not on file  . Sexually Abused: Not on file    Allergies  Allergen Reactions  . Penicillins Other (See Comments)    Unknown childhood allergic reaction Has patient had a PCN reaction  causing immediate rash, facial/tongue/throat swelling, SOB or lightheadedness with hypotension: Unknown Has patient had a PCN reaction causing severe rash involving mucus membranes or skin necrosis: Unknown Has patient had a PCN reaction that required hospitalization: No Has patient had a PCN reaction occurring within the last 10 years: No If all of the above answers are "NO", then may proceed with Cephalosporin use.   . Tizanidine Other (See Comments)    07/26/20: Pt does not recognize drug and does not remember being allergic to it.    Family History  Problem Relation Age of Onset  . Cardiomyopathy Father   . Asthma Other     Prior to Admission medications   Medication Sig Start Date End Date Taking? Authorizing Provider  amLODipine (NORVASC) 10 MG tablet Take 1 tablet (10 mg total) by mouth daily. 07/04/20  Yes Biagio Borg, MD  atorvastatin (LIPITOR) 10 MG tablet TAKE 1 TABLET (10 MG TOTAL) BY MOUTH DAILY. 07/04/20  Yes Biagio Borg, MD  Cholecalciferol (THERA-D 2000) 50 MCG (2000 UT) TABS 1 tab by mouth once daily 06/28/20  Yes Biagio Borg, MD  cloNIDine (CATAPRES) 0.3 MG tablet Take 0.3 mg by mouth in the morning, at noon, and at bedtime.   Yes [provider]  furosemide (LASIX) 80 MG tablet Take 1 tablet (80 mg total) by mouth daily as needed. 07/04/20  Yes Biagio Borg, MD  glipiZIDE (GLUCOTROL XL) 5 MG 24 hr tablet TAKE 1 TABLET BY MOUTH EVERY DAY WITH BREAKFAST 07/04/20  Yes Biagio Borg, MD  indomethacin (INDOCIN) 50 MG capsule TAKE 1 CAPSULE (50 MG TOTAL) BY MOUTH 3 (THREE) TIMES DAILY AS NEEDED. Patient taking differently: Take 50 mg by mouth 3 (three) times daily as needed (gout).  02/04/20  Yes Biagio Borg, MD  metFORMIN (GLUCOPHAGE) 500 MG tablet Take 2 tablets (1,000 mg total) by mouth 2 (two) times daily with a meal. 07/04/20  Yes Biagio Borg, MD  potassium chloride SA (KLOR-CON M20) 20 MEQ tablet Take 1 tablet (20 mEq total) by mouth daily. 07/04/20   Yes Biagio Borg, MD  telmisartan (MICARDIS) 80 MG tablet Take 1 tablet (80 mg total) by mouth daily. 07/04/20  Yes Biagio Borg, MD  vitamin B-12 (CYANOCOBALAMIN) 1000 MCG tablet Take 1 tablet (1,000 mcg total) by mouth daily. 06/28/20  Yes Biagio Borg, MD  diclofenac (VOLTAREN) 50 MG EC tablet Take 1 tablet (50 mg total) by mouth 2 (two) times daily. Patient not taking: Reported on 07/26/2020 12/19/19  Tasia Catchings, Amy V, PA-C  sildenafil (VIAGRA) 100 MG tablet Take 0.5-1 tablets (50-100 mg total) by mouth daily as needed for erectile dysfunction. Patient not taking: Reported on 07/26/2020 12/17/17   Biagio Borg, MD    Physical Exam: Vitals:   07/26/20 1445 07/26/20 1509 07/26/20 1639 07/26/20 1735  BP: 119/78 (!) 129/114 138/79 133/90  Pulse:  (!) 122  (!) 121  Resp: (!) 28 (!) 28 (!) 26 20  Temp:   99.8 F (37.7 C)   TempSrc:   Oral   SpO2: 100% 100% 98% 98%     . General:  Appears calm and comfortable and is NAD . Eyes:  PERRL, EOMI, normal lids, iris . ENT:  grossly normal hearing, lips & tongue, mmm; some absent dentition . Neck:  no LAD, masses or thyromegaly . Cardiovascular:  RRR, no m/r/g.  . Respiratory:   CTA bilaterally with no wheezes/rales/rhonchi.  Mildly increased respiratory effort. . Abdomen:  soft, NT, ND, NABS, obese . Back:   normal alignment, no CVAT . Skin:  LLE marked erythema, edema; his L great toe has a lac on the medial plantar surface that is open but not obviously infected.  No other clearly visible skin breaks but it is difficult to fully assess his posterior lower leg due to his habitus.          . Musculoskeletal:  grossly normal tone BUE/BLE, good ROM, no bony abnormality . Psychiatric:  grossly normal mood and affect, speech fluent and appropriate, AOx3 . Neurologic:  CN 2-12 grossly intact, moves all extremities in coordinated fashion    Radiological Exams on Admission: Independently reviewed - see discussion in A/P where  applicable  DG Chest 2 View  Result Date: 07/26/2020 CLINICAL DATA:  Chest pain and hypertension 2 days with shortness of breath. EXAM: CHEST - 2 VIEW COMPARISON:  12/22/2004 FINDINGS: Lungs are adequately inflated without focal airspace consolidation or effusion. Mild stable cardiomegaly. Remainder of the exam is unchanged. IMPRESSION: No active cardiopulmonary disease. Electronically Signed   By: Marin Olp M.D.   On: 07/26/2020 14:15   VAS [place] LOWER EXTREMITY VENOUS (DVT) (ONLY [facility] & [facility])  Result Date: 07/26/2020  Lower Venous DVT Study Indications: Edema, and Erythema. Other Indications: 174.6kg. Limitations: Body habitus, depth of vessels, tissue properties, clothing restriction, patient unable to tolerate compression maneuvers. Comparison Study: 10/29/2013- negative lower extremity venous duplex Performing Technologist: Maudry Mayhew RDMS, RVT, RDCS  Examination Guidelines: A complete evaluation includes B-mode imaging, spectral Doppler, color Doppler, and power Doppler as needed of all accessible portions of each vessel. Bilateral testing is considered an integral part of a complete examination. Limited examinations for reoccurring indications may be performed as noted. The reflux portion of the exam is performed with the patient in reverse Trendelenburg.  +-----+---------------+---------+-----------+-------------------+--------------+ RIGHTCompressibilityPhasicitySpontaneityProperties         Thrombus Aging +-----+---------------+---------+-----------+-------------------+--------------+ CFV                                     unable to visualize               +-----+---------------+---------+-----------+-------------------+--------------+   +---------+---------------+---------+-----------+---------------+-------------+ LEFT     CompressibilityPhasicitySpontaneityProperties     Thrombus  Aging          +---------+---------------+---------+-----------+---------------+-------------+ CFV                                         unable to                                                                visualize                    +---------+---------------+---------+-----------+---------------+-------------+ SFJ                                         unable to                                                                visualize                    +---------+---------------+---------+-----------+---------------+-------------+ FV Prox                                     unable to                                                                visualize                    +---------+---------------+---------+-----------+---------------+-------------+ FV Mid                           Yes        patent                       +---------+---------------+---------+-----------+---------------+-------------+ FV Distal                        Yes        patent                       +---------+---------------+---------+-----------+---------------+-------------+ PFV                                         unable to  visualize                    +---------+---------------+---------+-----------+---------------+-------------+ POP                     Yes      Yes        patent                       +---------+---------------+---------+-----------+---------------+-------------+ PTV                              Yes        patent                       +---------+---------------+---------+-----------+---------------+-------------+ PERO                             Yes        patent                       +---------+---------------+---------+-----------+---------------+-------------+     Summary: LEFT: - There is no evidence of deep vein thrombosis in the lower extremity. However, portions  of this examination were limited- see technologist comments above.  - No cystic structure found in the popliteal fossa.  *See table(s) above for measurements and observations.    Preliminary     EKG: Independently reviewed.  Sinus tachycardia with rate 125; nonspecific ST changes that is likely rate-related   Labs on Admission: I have personally reviewed the available labs and imaging studies at the time of the admission.  Pertinent labs:   Glucose 179 BUN 35/Creatinine 2.60/GFR 27; 28/1.57/49 on 10/21 BNP 296.5 HS troponin 28 WBC 18.4 Hgb 11.7 - stable INR 1.4   Assessment/Plan Principal Problem:   Sepsis due to cellulitis [facility] Active Problems:   Diabetes mellitus type 2 in obese (HCC)   Hyperlipidemia   Morbid obesity (HCC)   OSA (obstructive sleep apnea)   Essential hypertension   AKI (acute kidney injury) ([place])   Stage 3a chronic kidney disease ([place])   Sepsis due to LLE cellulitis -Sepsis indicates life-threatening organ dysfunction with mortality >10%, caused by dysregulation to host response.   -SIRS criteria in this patient includes: Leukocytosis, tachycardia, tachypnea -Patient has evidence of acute organ failure with elevated lactate >2; creatinine >2 that is not easily explained by another condition. -While awaiting blood cultures, this appears to be a preseptic condition. -Sepsis protocol initiated -Worse outcomes are predicted from sepsis with 2 of the following: RR > 22; AMS , GCS < 13; or SBP <100.  This patient meets 1 of these criteria. -Suspected source is LLE cellulitis -Patient with h/o B LE cellulitis previously -Portal of entry appears to be left great toe wound; not obviously infected but will check toe MRI to r/o osteo -By the cellulitis order set, this would be considered severe non-purulent cellulitis -Admission in this patient is warranted due to:  -limb-threatening infection is suspected  -high-risk comorbid condition is indicated by super  morbid obesity -Blood and urine cultures pending -Treat with IV Cefepime/Vanc/Flagyl based on order set recommendations -Will add HIV -Will trend lactate to ensure improvement -Will order procalcitonin level.  Antibiotics would not be indicated for PCT <0.1 and probably should not be used for < 0.25.  >0.5 indicates infection and >>0.5 indicates more serious  disease.  As the procalcitonin level normalizes, it will be reasonable to consider de-escalation of antibiotic coverage.  AKI on stage 3a CKD -Appears to have significantly worsened renal function compared to baseline -Will hydrate and follow -Given body habitus, it is difficult to determine extent of volume deficiency -Will bolus as needed for elevated lactate -Hold Lasix, Glucophage, Micardis  DM -Recent A1c was 7.0, indicating suboptimal control -hold Glucophage, Glucotrol -Cover with moderate-scale SSI  HTN -Continue Norvasc, Catapres -Hold Micardis -Will cover with prn IV hydralazine  HLD -Continue Lipitor  OSA -Continue CPAP  Morbid obesity -BMI is 52.22 -Weight loss should be encouraged -Outpatient PCP/bariatric medicine/bariatric surgery f/u encouraged     Note: This patient has been tested and is pending for the novel coronavirus COVID-19. He has been fully vaccinated against COVID-19.    DVT prophylaxis:  Lovenox Code Status:  Full - confirmed with patient Family Communication: None present Disposition Plan:  The patient is from: home  Anticipated d/c is to: home without [facility] services  Anticipated d/c date will depend on clinical response to treatment, but likely 2-3 days  Patient is currently: acutely ill Consults called: None  Admission status: Admit - It is my clinical opinion that admission to [facility] is reasonable and necessary because of the expectation that this patient will require hospital care that crosses at least 2 midnights to treat this condition based on the medical complexity of the  problems presented.  Given the aforementioned information, the predictability of an adverse outcome is felt to be significant.    Karmen Bongo MD [facility]   How to contact the [facility] Attending or Consulting provider [place] or covering provider during after hours [place], for this patient?  1. Check the care team in [facility] and look for a) attending/consulting [facility] provider listed and b) the [facility] team listed 2. Log into [email or web address] and use [place]'s universal password to access. If you do not have the password, please contact the hospital operator. 3. Locate the [facility] provider you are looking for under [facility] and page to a number that you can be directly reached. 4. If you still have difficulty reaching the provider, please page the [facility] (Director on Call) for the Hospitalists listed on amion for assistance.   07/26/2020, 5:37 PM

## 2020-07-26 NOTE — Progress Notes (Signed)
Left lower extremity venous duplex completed. Refer to "CV Proc" under chart review to view preliminary results.  07/26/2020 4:08 PM Kelby Aline., MHA, RVT, RDCS, RDMS

## 2020-07-26 NOTE — ED Provider Notes (Signed)
[facility]    CSN: [id number] Arrival date & time: 07/26/20  1146      History   Chief Complaint Chief Complaint  Patient presents with  . Leg Pain    HPI Gerald Day is a 64 y.o. male.   HPI  Patient presents today for evaluation of left leg swelling and with shortness of breath.  Patient has had a history of CHF, hypertension, and diabetes.  He reports he noticed worsening of his chronic leg swelling over 4 days ago, redness and he has had some pain with positional changes in his left leg.  He takes furosemide chronically for swelling.  He has had some history of cellulitis however most recent negative DVT study was in 2015.  He is tachycardic on arrival today and blood pressure is rather soft at 104/66.  He is also mildly tachypneic.  Giving initial vital signs and history ECG ordered and he was found to be in atrial flutter 2-1 AV conduction with a rate of 123, per review of EMR this is a new finding.  He denies any chest pain, cough or any unintentional weight gain. Past Medical History:  Diagnosis Date  . Arthritis    IN KNEES  . Cellulitis 10/26/2013   LOWER RT EXTREMITY  . CELLULITIS/ABSCESS, LEG 06/27/2007  . COLONIC POLYPS   . CONGESTIVE HEART FAILURE   . DIABETES MELLITUS, TYPE II   . DIVERTICULOSIS, COLON   . ERECTILE DYSFUNCTION, ORGANIC   . GOUT NOS   . Headache   . HYPERLIPIDEMIA   . HYPERTENSION   . LOW BACK PAIN   . Morbid obesity ([place])   . SLEEP APNEA, OBSTRUCTIVE    USES CPAP    Patient Active Problem List   Diagnosis Date Noted  . Stage 3a chronic kidney disease ([place]) 07/04/2020  . Vitamin D deficiency 12/28/2019  . B12 deficiency 12/28/2019  . Glaucoma 12/22/2018  . Radiculopathy, lumbar region 01/17/2018  . Constipation 06/18/2017  . AKI (acute kidney injury) ([place]) 06/02/2017  . Chronic midline low back pain without sciatica 05/14/2017  . Proteinuria 11/22/2016  . Skin lesion 11/12/2015  . Preop exam for internal  medicine 01/16/2015  . Cellulitis and abscess of leg 10/26/2013  . DM foot ulcer ([place]) 10/23/2013  . Encounter for well adult exam with abnormal findings 07/05/2011  . History of colonic polyps 09/05/2009  . COLONIC POLYPS 03/14/2008  . DIVERTICULOSIS, COLON 03/14/2008  . Hyperlipidemia 06/27/2007  . CELLULITIS/ABSCESS, LEG 06/27/2007  . Diabetes ([place]) 05/21/2007  . GOUT NOS 05/21/2007  . Morbid obesity ([place]) 05/21/2007  . ERECTILE DYSFUNCTION 05/21/2007  . OSA (obstructive sleep apnea) 05/21/2007  . Essential hypertension 05/21/2007    Past Surgical History:  Procedure Laterality Date  . APPENDECTOMY    . FOOT SURGERY     LT  . TENOTOMY / FLEXOR TENDON TRANSFER Right 03/15/2015   Procedure: TENOTOMY HT REPAIR/ULCER DEBRIDEMENT/GRAFT PREP/ACELL GRAFT APPLICATION;  Surgeon: Jana Half, DPM;  Location: [facility];  Service: Podiatry;  Laterality: Right;  HALLUX       Home Medications    Prior to Admission medications   Medication Sig Start Date End Date Taking? Authorizing Provider  amLODipine (NORVASC) 10 MG tablet Take 1 tablet (10 mg total) by mouth daily. 07/04/20  Yes Biagio Borg, MD  atorvastatin (LIPITOR) 10 MG tablet TAKE 1 TABLET (10 MG TOTAL) BY MOUTH DAILY. 07/04/20  Yes Biagio Borg, MD  Cholecalciferol ([place]) 50 MCG (2000 UT)  TABS 1 tab by mouth once daily 06/28/20  Yes Biagio Borg, MD  cloNIDine (CATAPRES) 0.3 MG tablet Take 0.3 mg by mouth in the morning, at noon, and at bedtime.   Yes [provider]  furosemide (LASIX) 80 MG tablet Take 1 tablet (80 mg total) by mouth daily as needed. 07/04/20  Yes Biagio Borg, MD  glipiZIDE (GLUCOTROL XL) 5 MG 24 hr tablet TAKE 1 TABLET BY MOUTH EVERY DAY WITH BREAKFAST 07/04/20  Yes Biagio Borg, MD  indomethacin (INDOCIN) 50 MG capsule TAKE 1 CAPSULE (50 MG TOTAL) BY MOUTH 3 (THREE) TIMES DAILY AS NEEDED. 02/04/20  Yes Biagio Borg, MD  metFORMIN (GLUCOPHAGE) 500 MG tablet Take 2 tablets (1,000 mg  total) by mouth 2 (two) times daily with a meal. 07/04/20  Yes Biagio Borg, MD  potassium chloride SA (KLOR-CON M20) 20 MEQ tablet Take 1 tablet (20 mEq total) by mouth daily. 07/04/20  Yes Biagio Borg, MD  telmisartan (MICARDIS) 80 MG tablet Take 1 tablet (80 mg total) by mouth daily. 07/04/20  Yes Biagio Borg, MD  vitamin B-12 (CYANOCOBALAMIN) 1000 MCG tablet Take 1 tablet (1,000 mcg total) by mouth daily. 06/28/20  Yes Biagio Borg, MD  diclofenac (VOLTAREN) 50 MG EC tablet Take 1 tablet (50 mg total) by mouth 2 (two) times daily. 12/19/19   Tasia Catchings, Amy V, PA-C  sildenafil (VIAGRA) 100 MG tablet Take 0.5-1 tablets (50-100 mg total) by mouth daily as needed for erectile dysfunction. 12/17/17   Biagio Borg, MD    Family History Family History  Problem Relation Age of Onset  . Cardiomyopathy Father   . Asthma Other     Social History Social History   Tobacco Use  . Smoking status: Never Smoker  . Smokeless tobacco: Never Used  Vaping Use  . Vaping Use: Never used  Substance Use Topics  . Alcohol use: Not Currently    Alcohol/week: 2.0 standard drinks    Types: 2 Cans of beer per week    Comment: pt stopped drinking 1 week ago  . Drug use: No     Allergies   Penicillins and Tizanidine Review of Systems Review of Systems Pertinent negatives listed in HPI  Physical Exam Triage Vital Signs ED Triage Vitals  Enc Vitals Group     BP 07/26/20 1201 104/66     Pulse Rate 07/26/20 1201 (!) 122     Resp 07/26/20 1201 (!) 21     Temp 07/26/20 1201 98.6 F (37 C)     Temp Source 07/26/20 1201 Oral     SpO2 07/26/20 1201 97 %     Weight 07/26/20 1153 (!) 385 lb (174.6 kg)     Height 07/26/20 1153 6' (1.829 m)     Head Circumference --      Peak Flow --      Pain Score 07/26/20 1152 8     Pain Loc --      Pain Edu? --      Excl. in [place]? --    No data found.  Updated Vital Signs BP 104/66 (BP Location: Left Arm)   Pulse (!) 122   Temp 98.6 F (37 C) (Oral)   Resp  (!) 21   Ht 6' (1.829 m)   Wt (!) 385 lb (174.6 kg)   SpO2 97%   BMI 52.22 kg/m   Visual Acuity Right Eye Distance:   Left Eye Distance:   Bilateral Distance:  Right Eye Near:   Left Eye Near:    Bilateral Near:     Physical Exam Constitutional:      Appearance: He is obese. He is ill-appearing.  Cardiovascular:     Rate and Rhythm: Tachycardia present. Rhythm irregular.  Pulmonary:     Effort: Tachypnea and prolonged expiration present.     Breath sounds: No rhonchi or rales.  Musculoskeletal:     Right lower leg: 2+ Pitting Edema present.     Left lower leg: 4+ Pitting Edema present.  Psychiatric:        Attention and Perception: Attention normal.        Mood and Affect: Mood normal.        Speech: Speech normal.        Behavior: Behavior normal.    UC Treatments / Results  Labs (all labs ordered are listed, but only abnormal results are displayed) Labs Reviewed - No data to display  EKG   Radiology No results found.  Procedures Procedures (including critical care time)  Medications Ordered in UC Medications - No data to display  Initial Impression / Assessment and Plan / UC Course  I have reviewed the triage vital signs and the nursing notes.  Pertinent labs & imaging results that were available during my care of the patient were reviewed by me and considered in my medical decision making (see chart for details).    Given patient's complex medical history and new onset atrial flutter and possible DVT involving the left lower leg given appearance patient is being routed to the emergency department.  Patient refused EMS transport however I was able to contact his son via phone and he since arrived at urgent care and he will transport father to [facility] emergency department.  Patient is stable for transport by family member.  He is without distress.  And agrees to go immediately for further work-up at the emergency department. Final Clinical  Impressions(s) / UC Diagnoses   Final diagnoses:  New onset atrial flutter ([place])  Left leg swelling     Discharge Instructions     Go immediately to [facility] emergency department as you have new onset atrial flutter and left leg swelling which I suspect is either heart failure related or possible blood clot.  You need further evaluation in the setting of the emergency department.    ED Prescriptions    None     PDMP not reviewed this encounter.   Scot Jun, FNP 07/26/20 1244

## 2020-07-26 NOTE — ED Notes (Signed)
Report given to Natalie, RN

## 2020-07-26 NOTE — ED Notes (Signed)
Pt to ED for left leg skin infection with noted swelling. Red rash noted to bilateral upper extremities prior to antibiotic administration. Pt reports he noticed upper extremity rash 2-3 days ago.  Pt tachycardic. Denies pain or SOB

## 2020-07-26 NOTE — Consult Note (Signed)
WOC Nurse Consult Note: Reason for Consult: Chronic, nonhealing laceration to left great toe, edema and erythema with mild weeping from LLE. Photos taken today in the ED are appreciated. Wound type: infectious, venous insufficiency Pressure Injury POA: N/A Measurement: Bedside RN to measure laceration on left great toe and enter measurement on Nursing Flow Sheet prior to performing first dressing application. Wound bed:red, dry Drainage (amount, consistency, odor) small, serous Periwound: as noted above Dressing procedure/placement/frequency: I have provided Nursing with guidance for topical care of LLE. Care will be taken to apply an ABD pad around the ankle prior to wrapping due to deformity. Heels are to be floated to prevent pressure injury and a sacral foam is provided with the same rationale. Systemic antibiotics will be provided by the medical team.   [place] nursing team will not follow, but will remain available to this patient, the nursing and medical teams.  Please re-consult if needed. Thanks, Maudie Flakes, MSN, RN, [place], Arther Abbott  Pager# [phone]

## 2020-07-27 ENCOUNTER — Inpatient Hospital Stay (HOSPITAL_COMMUNITY): Payer: Medicare HMO

## 2020-07-27 DIAGNOSIS — A419 Sepsis, unspecified organism: Secondary | ICD-10-CM | POA: Diagnosis not present

## 2020-07-27 DIAGNOSIS — L0390 Cellulitis, unspecified: Secondary | ICD-10-CM | POA: Diagnosis not present

## 2020-07-27 LAB — GLUCOSE, CAPILLARY
Glucose-Capillary: 152 mg/dL — ABNORMAL HIGH (ref 70–99)
Glucose-Capillary: 143 mg/dL — ABNORMAL HIGH (ref 70–99)
Glucose-Capillary: 138 mg/dL — ABNORMAL HIGH (ref 70–99)
Glucose-Capillary: 163 mg/dL — ABNORMAL HIGH (ref 70–99)

## 2020-07-27 LAB — BASIC METABOLIC PANEL
Anion gap: 10 (ref 5–15)
BUN: 37 mg/dL — ABNORMAL HIGH (ref 8–23)
CO2: 21 mmol/L — ABNORMAL LOW (ref 22–32)
Calcium: 8.2 mg/dL — ABNORMAL LOW (ref 8.9–10.3)
Chloride: 103 mmol/L (ref 98–111)
Creatinine, Ser: 2.41 mg/dL — ABNORMAL HIGH (ref 0.61–1.24)
GFR, Estimated: 29 mL/min — ABNORMAL LOW (ref >60)
Glucose, Bld: 191 mg/dL — ABNORMAL HIGH (ref 70–99)
Potassium: 4.2 mmol/L (ref 3.5–5.1)
Sodium: 134 mmol/L — ABNORMAL LOW (ref 135–145)

## 2020-07-27 LAB — CBC
HCT: 28.9 % — ABNORMAL LOW (ref 39.0–52.0)
Hemoglobin: 9.4 g/dL — ABNORMAL LOW (ref 13.0–17.0)
MCH: 29.7 pg (ref 26.0–34.0)
MCHC: 32.5 g/dL (ref 30.0–36.0)
MCV: 91.5 fL (ref 80.0–100.0)
Platelets: 205 10*3/uL (ref 150–400)
RBC: 3.16 MIL/uL — ABNORMAL LOW (ref 4.22–5.81)
RDW: 14.2 % (ref 11.5–15.5)
WBC: 15.2 10*3/uL — ABNORMAL HIGH (ref 4.0–10.5)
nRBC: 0 % (ref 0.0–0.2)

## 2020-07-27 MED ORDER — VANCOMYCIN HCL 1750 MG/350ML IV SOLN
1750.0000 mg | INTRAVENOUS | Status: DC
  Administered 2020-07-27 – 2020-07-29 (×3): 1750 mg via INTRAVENOUS
  Filled 2020-07-27 (×3): qty 350

## 2020-07-27 MED ORDER — GADOBUTROL 1 MMOL/ML IV SOLN
10.0000 mL | Freq: Once | INTRAVENOUS | Status: AC | PRN
  Administered 2020-07-27: 10 mL via INTRAVENOUS

## 2020-07-27 NOTE — Progress Notes (Signed)
PROGRESS NOTE    Gerald Day  BSJ:[id number] DOB: 08/18/1956 DOA: 07/26/2020 PCP: Biagio Borg, MD   Brief Narrative: 64 year old with past medical history significant of OSA on CPAP, morbid obesity, hypertension, HLD, diabetes, CHF, history of right lower extremity cellulitis presenting with lower extremity cellulitis.  Patient also presented with shortness of breath since 3 days, lower extremity edema.  Patient present with lower extremity cellulitis, AKI with a creatinine on admission at 2.6, prior creatinine level 1.5.     Assessment & Plan:   Principal Problem:   Sepsis due to cellulitis [facility]) Active Problems:   Diabetes mellitus type 2 in obese (HCC)   Hyperlipidemia   Morbid obesity (HCC)   OSA (obstructive sleep apnea)   Essential hypertension   AKI (acute kidney injury) ([place])   Stage 3a chronic kidney disease ([place])   1-Sepsis secondary to left lower extremity cellulitis: Patient presented with leukocytosis, tachycardia, tachypnea fever. Follow blood cultures:No growth to date.  Continue with IV cefepime, vancomycin and Flagyl Doppler lower extremity negative for DVT. White blood cell trending down 18---15   2-AKI on CKD stage IIIa: Present with a creatinine at 2.6, prior creatinine 1.5 Continue to hold Micardis Creatinine down to 2.4 Continue with IV for another 24 hours.   3-Diabetes type 2: hb A1c 7.0 Holding Glucophage and Glucotrol. Continue with a sliding scale insulin  4-HTN; Continue with Norvasc, clonidinde  Mild hyponatremia: Continue with IV fluids.   HLD; Continue with lipitor.   OSA; continue with CPAP.   Morbid Obesity:  Needs weight loss.     Estimated body mass index is 52.22 kg/m as calculated from the following:   Height as of this encounter: 6' (1.829 m).   Weight as of an earlier encounter on 07/26/20: 174.6 kg.   DVT prophylaxis: Lovenox Code Status: Full Code Family Communication: care discussed with patient.   Disposition Plan:  Status is: Inpatient  Remains inpatient appropriate because:IV treatments appropriate due to intensity of illness or inability to take PO   Dispo: The patient is from: Home              Anticipated d/c is to: to be determine              Anticipated d/c date is: 2 days              Patient currently is not medically stable to d/c.        Consultants:   none  Procedures:   Doppler; negative for DVT  Antimicrobials:    Subjective: Alert, denies dyspnea.  LE with persistent redness.   Objective: Vitals:   07/26/20 2253 07/26/20 2317 07/27/20 0100 07/27/20 0453  BP: 123/71  (!) 108/54 (!) 159/84  Pulse: (!) 118 (!) 111 (!) 115 (!) 115  Resp: 18 18 18 18   Temp: 99.5 F (37.5 C)  99.7 F (37.6 C) 98.3 F (36.8 C)  TempSrc: Oral  Oral Oral  SpO2: 95% 96% 96% 94%  Height:        Intake/Output Summary (Last 24 hours) at 07/27/2020 0703 Last data filed at 07/27/2020 0600 Gross per 24 hour  Intake 1327.64 ml  Output 0 ml  Net 1327.64 ml   There were no vitals filed for this visit.  Examination:  General exam: Appears calm and comfortable  Respiratory system: Clear to auscultation. Respiratory effort normal. Cardiovascular system: S1 & S2 heard, RRR. No JVD, murmurs, rubs, gallops or clicks. No pedal edema. Gastrointestinal system: Abdomen  is nondistended, soft and nontender. No organomegaly or masses felt. Normal bowel sounds heard. Central nervous system: Alert and oriented. No focal neurological deficits. Extremities: Symmetric 5 x 5 power. Left LE with redness, small cut big toe.  Skin: No rashes, lesions or ulcers    Data Reviewed: I have personally reviewed following labs and imaging studies  CBC: Recent Labs  Lab 07/26/20 1329 07/27/20 0445  WBC 18.4* 15.2*  HGB 11.7* 9.4*  HCT 35.9* 28.9*  MCV 93.2 91.5  PLT 304 213   Basic Metabolic Panel: Recent Labs  Lab 07/26/20 1329 07/26/20 1345 07/27/20 0445  NA 135  --   134*  K 4.0  --  4.2  CL 101  --  103  CO2 21*  --  21*  GLUCOSE 179*  --  191*  BUN 35*  --  37*  CREATININE 2.60*  --  2.41*  CALCIUM 8.8*  --  8.2*  MG  --  1.7  --    GFR: Estimated Creatinine Clearance: 50.3 mL/min (A) (by C-G formula based on SCr of 2.41 mg/dL (H)). Liver Function Tests: Recent Labs  Lab 07/26/20 1450  AST 21  ALT 22  ALKPHOS 87  BILITOT 1.3*  PROT 6.9  ALBUMIN 2.7*   No results for input(s): LIPASE, AMYLASE in the last 168 hours. No results for input(s): AMMONIA in the last 168 hours. Coagulation Profile: Recent Labs  Lab 07/26/20 1345  INR 1.4*   Cardiac Enzymes: No results for input(s): CKTOTAL, CKMB, CKMBINDEX, TROPONINI in the last 168 hours. BNP (last 3 results) No results for input(s): PROBNP in the last 8760 hours. HbA1C: No results for input(s): HGBA1C in the last 72 hours. CBG: Recent Labs  Lab 07/26/20 2049 07/27/20 0642  GLUCAP 190* 152*   Lipid Profile: No results for input(s): CHOL, HDL, LDLCALC, TRIG, CHOLHDL, LDLDIRECT in the last 72 hours. Thyroid Function Tests: No results for input(s): TSH, T4TOTAL, FREET4, T3FREE, THYROIDAB in the last 72 hours. Anemia Panel: No results for input(s): VITAMINB12, FOLATE, FERRITIN, TIBC, IRON, RETICCTPCT in the last 72 hours. Sepsis Labs: Recent Labs  Lab 07/26/20 1450 07/26/20 1736 07/26/20 1746 07/26/20 2228  PROCALCITON  --  1.64  --   --   LATICACIDVEN 2.1*  --  1.5 1.3    Recent Results (from the past 240 hour(s))  Respiratory Panel by RT PCR (Flu A&B, Covid) - Nasopharyngeal Swab     Status: None   Collection Time: 07/26/20  5:54 PM   Specimen: Nasopharyngeal Swab; Nasopharyngeal(NP) swabs in vial transport medium  Result Value Ref Range Status   SARS Coronavirus 2 by RT PCR NEGATIVE NEGATIVE Final    Comment: (NOTE) SARS-CoV-2 target nucleic acids are NOT DETECTED.  The SARS-CoV-2 RNA is generally detectable in upper respiratoy specimens during the acute phase of  infection. The lowest concentration of SARS-CoV-2 viral copies this assay can detect is 131 copies/mL. A negative result does not preclude SARS-Cov-2 infection and should not be used as the sole basis for treatment or other patient management decisions. A negative result may occur with  improper specimen collection/handling, submission of specimen other than nasopharyngeal swab, presence of viral mutation(s) within the areas targeted by this assay, and inadequate number of viral copies (<131 copies/mL). A negative result must be combined with clinical observations, patient history, and epidemiological information. The expected result is Negative.  Fact Sheet for Patients:  PinkCheek.be  Fact Sheet for Healthcare Providers:  GravelBags.it  This test is no t  yet approved or cleared by the [place] and  has been authorized for detection and/or diagnosis of SARS-CoV-2 by FDA under an Emergency Use Authorization (EUA). This EUA will remain  in effect (meaning this test can be used) for the duration of the COVID-19 declaration under Section 564(b)(1) of the Act, 21 U.S.C. section 360bbb-3(b)(1), unless the authorization is terminated or revoked sooner.     Influenza A by PCR NEGATIVE NEGATIVE Final   Influenza B by PCR NEGATIVE NEGATIVE Final    Comment: (NOTE) The Xpert Xpress SARS-CoV-2/FLU/RSV assay is intended as an aid in  the diagnosis of influenza from Nasopharyngeal swab specimens and  should not be used as a sole basis for treatment. Nasal washings and  aspirates are unacceptable for Xpert Xpress SARS-CoV-2/FLU/RSV  testing.  Fact Sheet for Patients: PinkCheek.be  Fact Sheet for Healthcare Providers: GravelBags.it  This test is not yet approved or cleared by the [place] FDA and  has been authorized for detection and/or diagnosis of SARS-CoV-2  by  FDA under an Emergency Use Authorization (EUA). This EUA will remain  in effect (meaning this test can be used) for the duration of the  Covid-19 declaration under Section 564(b)(1) of the Act, 21  U.S.C. section 360bbb-3(b)(1), unless the authorization is  terminated or revoked. Performed at [facility], [place] [address] [place], [place] [address]          Radiology Studies: DG Chest 2 View  Result Date: 07/26/2020 CLINICAL DATA:  Chest pain and hypertension 2 days with shortness of breath. EXAM: CHEST - 2 VIEW COMPARISON:  12/22/2004 FINDINGS: Lungs are adequately inflated without focal airspace consolidation or effusion. Mild stable cardiomegaly. Remainder of the exam is unchanged. IMPRESSION: No active cardiopulmonary disease. Electronically Signed   By: Marin Olp M.D.   On: 07/26/2020 14:15   VAS [place] LOWER EXTREMITY VENOUS (DVT) (ONLY [facility] & [facility])  Result Date: 07/26/2020  Lower Venous DVT Study Indications: Edema, and Erythema. Other Indications: 174.6kg. Limitations: Body habitus, depth of vessels, tissue properties, clothing restriction, patient unable to tolerate compression maneuvers. Comparison Study: 10/29/2013- negative lower extremity venous duplex Performing Technologist: Maudry Mayhew RDMS, RVT, RDCS  Examination Guidelines: A complete evaluation includes B-mode imaging, spectral Doppler, color Doppler, and power Doppler as needed of all accessible portions of each vessel. Bilateral testing is considered an integral part of a complete examination. Limited examinations for reoccurring indications may be performed as noted. The reflux portion of the exam is performed with the patient in reverse Trendelenburg.  +-----+---------------+---------+-----------+-------------------+--------------+ RIGHTCompressibilityPhasicitySpontaneityProperties         Thrombus Aging +-----+---------------+---------+-----------+-------------------+--------------+ CFV                                      unable to visualize               +-----+---------------+---------+-----------+-------------------+--------------+   +---------+---------------+---------+-----------+---------------+-------------+ LEFT     CompressibilityPhasicitySpontaneityProperties     Thrombus                                                                 Aging         +---------+---------------+---------+-----------+---------------+-------------+ CFV  unable to                                                                visualize                    +---------+---------------+---------+-----------+---------------+-------------+ SFJ                                         unable to                                                                visualize                    +---------+---------------+---------+-----------+---------------+-------------+ FV Prox                                     unable to                                                                visualize                    +---------+---------------+---------+-----------+---------------+-------------+ FV Mid                           Yes        patent                       +---------+---------------+---------+-----------+---------------+-------------+ FV Distal                        Yes        patent                       +---------+---------------+---------+-----------+---------------+-------------+ PFV                                         unable to                                                                visualize                    +---------+---------------+---------+-----------+---------------+-------------+ POP  Yes      Yes        patent                       +---------+---------------+---------+-----------+---------------+-------------+ PTV                              Yes         patent                       +---------+---------------+---------+-----------+---------------+-------------+ PERO                             Yes        patent                       +---------+---------------+---------+-----------+---------------+-------------+     Summary: LEFT: - There is no evidence of deep vein thrombosis in the lower extremity. However, portions of this examination were limited- see technologist comments above.  - No cystic structure found in the popliteal fossa.  *See table(s) above for measurements and observations.    Preliminary         Scheduled Meds: . amLODipine  10 mg Oral Daily  . atorvastatin  10 mg Oral Daily  . cloNIDine  0.3 mg Oral TID  . docusate sodium  100 mg Oral BID  . enoxaparin (LOVENOX) injection  85 mg Subcutaneous Q24H  . insulin aspart  0-15 Units Subcutaneous TID WC  . insulin aspart  0-5 Units Subcutaneous QHS  . metroNIDAZOLE  500 mg Oral Q8H  . sodium chloride flush  3 mL Intravenous Q12H   Continuous Infusions: . ceFEPime (MAXIPIME) IV 2 g (07/27/20 0210)  . lactated ringers 100 mL/hr at 07/26/20 2359  . vancomycin       LOS: 1 day    Time spent: 35 minutes.     Elmarie Shiley, MD [facility]   If 7PM-7AM, please contact night-coverage [email or web address]  07/27/2020, 7:03 AM

## 2020-07-27 NOTE — Progress Notes (Signed)
   07/26/20 2050  Assess: MEWS Score  Temp 98.9 F (37.2 C)  BP 126/74  Pulse Rate (!) 118  Resp 18  Level of Consciousness Alert  SpO2 97 %  O2 Device Room Air  Patient Activity (if Appropriate) In bed  Assess: MEWS Score  MEWS Temp 0  MEWS Systolic 0  MEWS Pulse 2  MEWS RR 0  MEWS LOC 0  MEWS Score 2  MEWS Score Color Yellow  Assess: if the MEWS score is Yellow or Red  Were vital signs taken at a resting state? Yes  Focused Assessment No change from prior assessment  Early Detection of Sepsis Score *See Row Information* High  MEWS guidelines implemented *See Row Information* Yes  Treat  MEWS Interventions Administered scheduled meds/treatments  Pain Scale 0-10  Pain Score 0  Take Vital Signs  Increase Vital Sign Frequency  Yellow: Q 2hr X 2 then Q 4hr X 2, if remains yellow, continue Q 4hrs  Escalate  MEWS: Escalate Yellow: discuss with charge nurse/RN and consider discussing with provider and RRT  Notify: Charge Nurse/RN  Name of Charge Nurse/RN Notified Cheryl, RN  Date Charge Nurse/RN Notified 07/26/20  Time Charge Nurse/RN Notified 2307  Notify: Provider  Provider Name/Title Opyd, MD  Date Provider Notified 07/26/20  Time Provider Notified 2323  Notification Type Page  Notification Reason Other (Comment) (elevated heart rate)  Response No new orders  Date of Provider Response 07/26/20  Time of Provider Response 2326  Notify: Rapid Response  Name of Rapid Response RN Notified  (No)  Document  Progress note created (see row info) Yes

## 2020-07-27 NOTE — Plan of Care (Signed)
  Problem: Education: Goal: Knowledge of General Education information will improve Description Including pain rating scale, medication(s)/side effects and non-pharmacologic comfort measures Outcome: Progressing   

## 2020-07-27 NOTE — Progress Notes (Signed)
Pharmacy Antibiotic Note  Gerald Day is a 64 y.o. male admitted on 07/26/2020 with sepsis and cellulitis.  Pharmacy has been consulted for Cefepime and Vancomycin dosing.  Scr trended down to 2.41 so we will adjust vanc slightly.   Height: 6' (182.9 cm) IBW/kg (Calculated) : 77.6  Temp (24hrs), Avg:99.4 F (37.4 C), Min:98.3 F (36.8 C), Max:101.2 F (38.4 C)  Recent Labs  Lab 07/26/20 1329 07/26/20 1450 07/26/20 1746 07/26/20 2228 07/27/20 0445  WBC 18.4*  --   --   --  15.2*  CREATININE 2.60*  --   --   --  2.41*  LATICACIDVEN  --  2.1* 1.5 1.3  --     Estimated Creatinine Clearance: 50.3 mL/min (A) (by C-G formula based on SCr of 2.41 mg/dL (H)).    Allergies  Allergen Reactions  . Penicillins Other (See Comments)    Unknown childhood allergic reaction Has patient had a PCN reaction causing immediate rash, facial/tongue/throat swelling, SOB or lightheadedness with hypotension: Unknown Has patient had a PCN reaction causing severe rash involving mucus membranes or skin necrosis: Unknown Has patient had a PCN reaction that required hospitalization: No Has patient had a PCN reaction occurring within the last 10 years: No If all of the above answers are "NO", then may proceed with Cephalosporin use.   . Tizanidine Other (See Comments)    07/26/20: Pt does not recognize drug and does not remember being allergic to it.    Antimicrobials this admission: 11/19 Cefepime >>  11/19 Vancomycin >>   Dose adjustments this admission: N/a  Microbiology results: 11/19 blood>>ngtd   Plan:  - Cefepime 2g IV q12h - Increase Vancomycin 1750mg  IV q24h Level as needed  Onnie Boer, PharmD, BCIDP, AAHIVP, CPP Infectious Disease Pharmacist 07/27/2020 9:16 AM

## 2020-07-28 ENCOUNTER — Inpatient Hospital Stay (HOSPITAL_COMMUNITY): Payer: Medicare HMO

## 2020-07-28 DIAGNOSIS — L0390 Cellulitis, unspecified: Secondary | ICD-10-CM | POA: Diagnosis not present

## 2020-07-28 DIAGNOSIS — A419 Sepsis, unspecified organism: Secondary | ICD-10-CM | POA: Diagnosis not present

## 2020-07-28 LAB — GLUCOSE, CAPILLARY
Glucose-Capillary: 163 mg/dL — ABNORMAL HIGH (ref 70–99)
Glucose-Capillary: 195 mg/dL — ABNORMAL HIGH (ref 70–99)
Glucose-Capillary: 175 mg/dL — ABNORMAL HIGH (ref 70–99)
Glucose-Capillary: 182 mg/dL — ABNORMAL HIGH (ref 70–99)

## 2020-07-28 LAB — CBC
HCT: 29 % — ABNORMAL LOW (ref 39.0–52.0)
Hemoglobin: 9.4 g/dL — ABNORMAL LOW (ref 13.0–17.0)
MCH: 29.8 pg (ref 26.0–34.0)
MCHC: 32.4 g/dL (ref 30.0–36.0)
MCV: 92.1 fL (ref 80.0–100.0)
Platelets: 239 10*3/uL (ref 150–400)
RBC: 3.15 MIL/uL — ABNORMAL LOW (ref 4.22–5.81)
RDW: 14.6 % (ref 11.5–15.5)
WBC: 19.3 10*3/uL — ABNORMAL HIGH (ref 4.0–10.5)
nRBC: 0 % (ref 0.0–0.2)

## 2020-07-28 LAB — BASIC METABOLIC PANEL
Anion gap: 12 (ref 5–15)
BUN: 39 mg/dL — ABNORMAL HIGH (ref 8–23)
CO2: 19 mmol/L — ABNORMAL LOW (ref 22–32)
Calcium: 8.3 mg/dL — ABNORMAL LOW (ref 8.9–10.3)
Chloride: 105 mmol/L (ref 98–111)
Creatinine, Ser: 2.17 mg/dL — ABNORMAL HIGH (ref 0.61–1.24)
GFR, Estimated: 33 mL/min — ABNORMAL LOW (ref >60)
Glucose, Bld: 170 mg/dL — ABNORMAL HIGH (ref 70–99)
Potassium: 4 mmol/L (ref 3.5–5.1)
Sodium: 136 mmol/L (ref 135–145)

## 2020-07-28 LAB — URINE CULTURE: Culture: NO GROWTH

## 2020-07-28 NOTE — Progress Notes (Signed)
PROGRESS NOTE    Gerald Day  TGG:[id number] DOB: 12-22-1955 DOA: 07/26/2020 PCP: Biagio Borg, MD    Brief Narrative:  64 year old with past medical history significant of OSA on CPAP, morbid obesity, hypertension, HLD, diabetes, CHF, history of right lower extremity cellulitis presenting with lower extremity cellulitis.  Patient also presented with shortness of breath since 3 days, lower extremity edema.  Patient present with lower extremity cellulitis, AKI with a creatinine on admission at 2.6, prior creatinine level 1.5.    Consultants:     Procedures:   Antimicrobials:   Vancomycin, cefepime, metronidazole    Subjective: Has no new complaints. Denies cp, fever, chills. With Lying down no sob.  Objective: Vitals:   07/27/20 1302 07/27/20 1740 07/27/20 2114 07/28/20 0457  BP: 123/87 137/83 132/86 138/85  Pulse: (!) 114 (!) 121 (!) 107 (!) 110  Resp: 20 18 19 18   Temp: 98.8 F (37.1 C) 99.2 F (37.3 C) 99.7 F (37.6 C) 99.3 F (37.4 C)  TempSrc: Oral Oral Oral Oral  SpO2: 96% 94% 97% 98%  Height:        Intake/Output Summary (Last 24 hours) at 07/28/2020 0818 Last data filed at 07/28/2020 0600 Gross per 24 hour  Intake 2481.87 ml  Output 1500 ml  Net 981.87 ml   There were no vitals filed for this visit.  Examination:  General exam: Appears calm and comfortable  Respiratory system: Clear to auscultation. Respiratory effort normal. Cardiovascular system: S1 & S2 heard, RRR. No  murmurs, rubs, gallops or clicks. Gastrointestinal system: Abdomen is nondistended, soft and nontender.  Normal bowel sounds heard. Central nervous system: Alert and oriented. Grossly  Intact Extremities: 3+ edema , Rt >Lt. Mild erythema of Left, RT leg with erythema, warm to touch.  Skin: warm, dry Psychiatry: Judgement and insight appear normal. Mood & affect appropriate.     Data Reviewed: I have personally reviewed following labs and imaging studies  CBC: Recent  Labs  Lab 07/26/20 1329 07/27/20 0445 07/28/20 0454  WBC 18.4* 15.2* 19.3*  HGB 11.7* 9.4* 9.4*  HCT 35.9* 28.9* 29.0*  MCV 93.2 91.5 92.1  PLT 304 205 703   Basic Metabolic Panel: Recent Labs  Lab 07/26/20 1329 07/26/20 1345 07/27/20 0445 07/28/20 0454  NA 135  --  134* 136  K 4.0  --  4.2 4.0  CL 101  --  103 105  CO2 21*  --  21* 19*  GLUCOSE 179*  --  191* 170*  BUN 35*  --  37* 39*  CREATININE 2.60*  --  2.41* 2.17*  CALCIUM 8.8*  --  8.2* 8.3*  MG  --  1.7  --   --    GFR: Estimated Creatinine Clearance: 55.9 mL/min (A) (by C-G formula based on SCr of 2.17 mg/dL (H)). Liver Function Tests: Recent Labs  Lab 07/26/20 1450  AST 21  ALT 22  ALKPHOS 87  BILITOT 1.3*  PROT 6.9  ALBUMIN 2.7*   No results for input(s): LIPASE, AMYLASE in the last 168 hours. No results for input(s): AMMONIA in the last 168 hours. Coagulation Profile: Recent Labs  Lab 07/26/20 1345  INR 1.4*   Cardiac Enzymes: No results for input(s): CKTOTAL, CKMB, CKMBINDEX, TROPONINI in the last 168 hours. BNP (last 3 results) No results for input(s): PROBNP in the last 8760 hours. HbA1C: No results for input(s): HGBA1C in the last 72 hours. CBG: Recent Labs  Lab 07/27/20 628 154 5400 07/27/20 1156 07/27/20 1635 07/27/20 2115 07/28/20  0640  GLUCAP 152* 143* 138* 163* 163*   Lipid Profile: No results for input(s): CHOL, HDL, LDLCALC, TRIG, CHOLHDL, LDLDIRECT in the last 72 hours. Thyroid Function Tests: No results for input(s): TSH, T4TOTAL, FREET4, T3FREE, THYROIDAB in the last 72 hours. Anemia Panel: No results for input(s): VITAMINB12, FOLATE, FERRITIN, TIBC, IRON, RETICCTPCT in the last 72 hours. Sepsis Labs: Recent Labs  Lab 07/26/20 1450 07/26/20 1736 07/26/20 1746 07/26/20 2228  PROCALCITON  --  1.64  --   --   LATICACIDVEN 2.1*  --  1.5 1.3    Recent Results (from the past 240 hour(s))  Blood Culture (routine x 2)     Status: None (Preliminary result)   Collection  Time: 07/26/20  2:50 PM   Specimen: BLOOD  Result Value Ref Range Status   Specimen Description BLOOD RIGHT ANTECUBITAL  Final   Special Requests   Final    BLOOD Blood Culture results may not be optimal due to an inadequate volume of blood received in culture bottles   Culture   Final    NO GROWTH < 24 HOURS Performed at [facility] [address], [place], [place] [address]    Report Status PENDING  Incomplete  Blood Culture (routine x 2)     Status: None (Preliminary result)   Collection Time: 07/26/20  3:46 PM   Specimen: BLOOD  Result Value Ref Range Status   Specimen Description BLOOD SITE NOT SPECIFIED  Final   Special Requests   Final    BOTTLES DRAWN AEROBIC AND ANAEROBIC Blood Culture adequate volume   Culture   Final    NO GROWTH < 24 HOURS Performed at [facility], [place] [address], [place], [place] [address]    Report Status PENDING  Incomplete  Respiratory Panel by RT PCR (Flu A&B, Covid) - Nasopharyngeal Swab     Status: None   Collection Time: 07/26/20  5:54 PM   Specimen: Nasopharyngeal Swab; Nasopharyngeal(NP) swabs in vial transport medium  Result Value Ref Range Status   SARS Coronavirus 2 by RT PCR NEGATIVE NEGATIVE Final    Comment: (NOTE) SARS-CoV-2 target nucleic acids are NOT DETECTED.  The SARS-CoV-2 RNA is generally detectable in upper respiratoy specimens during the acute phase of infection. The lowest concentration of SARS-CoV-2 viral copies this assay can detect is 131 copies/mL. A negative result does not preclude SARS-Cov-2 infection and should not be used as the sole basis for treatment or other patient management decisions. A negative result may occur with  improper specimen collection/handling, submission of specimen other than nasopharyngeal swab, presence of viral mutation(s) within the areas targeted by this assay, and inadequate number of viral copies (<131 copies/mL). A negative result must be combined with  clinical observations, patient history, and epidemiological information. The expected result is Negative.  Fact Sheet for Patients:  PinkCheek.be  Fact Sheet for Healthcare Providers:  GravelBags.it  This test is no t yet approved or cleared by the [place] FDA and  has been authorized for detection and/or diagnosis of SARS-CoV-2 by FDA under an Emergency Use Authorization (EUA). This EUA will remain  in effect (meaning this test can be used) for the duration of the COVID-19 declaration under Section 564(b)(1) of the Act, 21 U.S.C. section 360bbb-3(b)(1), unless the authorization is terminated or revoked sooner.     Influenza A by PCR NEGATIVE NEGATIVE Final   Influenza B by PCR NEGATIVE NEGATIVE Final    Comment: (NOTE) The Xpert Xpress SARS-CoV-2/FLU/RSV  assay is intended as an aid in  the diagnosis of influenza from Nasopharyngeal swab specimens and  should not be used as a sole basis for treatment. Nasal washings and  aspirates are unacceptable for Xpert Xpress SARS-CoV-2/FLU/RSV  testing.  Fact Sheet for Patients: PinkCheek.be  Fact Sheet for Healthcare Providers: GravelBags.it  This test is not yet approved or cleared by the [place] FDA and  has been authorized for detection and/or diagnosis of SARS-CoV-2 by  FDA under an Emergency Use Authorization (EUA). This EUA will remain  in effect (meaning this test can be used) for the duration of the  Covid-19 declaration under Section 564(b)(1) of the Act, 21  U.S.C. section 360bbb-3(b)(1), unless the authorization is  terminated or revoked. Performed at [facility], [place] [address], [place], [place] [address]          Radiology Studies: DG Chest 2 View  Result Date: 07/26/2020 CLINICAL DATA:  Chest pain and hypertension 2 days with shortness of breath. EXAM: CHEST - 2 VIEW COMPARISON:   12/22/2004 FINDINGS: Lungs are adequately inflated without focal airspace consolidation or effusion. Mild stable cardiomegaly. Remainder of the exam is unchanged. IMPRESSION: No active cardiopulmonary disease. Electronically Signed   By: Marin Olp M.D.   On: 07/26/2020 14:15   MR TOES LEFT W WO CONTRAST  Result Date: 07/27/2020 CLINICAL DATA:  Diabetic with massive foot swelling. EXAM: MRI OF THE LEFT TOES WITHOUT AND WITH CONTRAST TECHNIQUE: Multiplanar, multisequence MR imaging of the left foot was performed both before and after administration of intravenous contrast. CONTRAST:  40mL GADAVIST GADOBUTROL 1 MMOL/ML IV SOLN COMPARISON:  None. FINDINGS: Extensive subcutaneous soft tissue swelling/edema/fluid most notably along the dorsum of the foot. No discrete rim enhancing fluid collection to suggest a drainable soft tissue abscess. No obvious open wound or gas in the soft tissues. The bony structures are intact. I do not see any findings suspicious for osteomyelitis or septic arthritis. Diffuse fatty change involving the forefoot musculature. Mild diffuse myositis but no findings for pyomyositis. IMPRESSION: 1. Extensive subcutaneous soft tissue swelling/edema/fluid suggesting severe cellulitis. No discrete rim enhancing fluid collection to suggest a drainable soft tissue abscess. 2. No findings for osteomyelitis or septic arthritis. Electronically Signed   By: Marijo Sanes M.D.   On: 07/27/2020 07:38   VAS [place] LOWER EXTREMITY VENOUS (DVT) (ONLY [facility] & [facility])  Result Date: 07/26/2020  Lower Venous DVT Study Indications: Edema, and Erythema. Other Indications: 174.6kg. Limitations: Body habitus, depth of vessels, tissue properties, clothing restriction, patient unable to tolerate compression maneuvers. Comparison Study: 10/29/2013- negative lower extremity venous duplex Performing Technologist: Maudry Mayhew RDMS, RVT, RDCS  Examination Guidelines: A complete evaluation includes B-mode imaging,  spectral Doppler, color Doppler, and power Doppler as needed of all accessible portions of each vessel. Bilateral testing is considered an integral part of a complete examination. Limited examinations for reoccurring indications may be performed as noted. The reflux portion of the exam is performed with the patient in reverse Trendelenburg.  +-----+---------------+---------+-----------+-------------------+--------------+ RIGHTCompressibilityPhasicitySpontaneityProperties         Thrombus Aging +-----+---------------+---------+-----------+-------------------+--------------+ CFV                                     unable to visualize               +-----+---------------+---------+-----------+-------------------+--------------+   +---------+---------------+---------+-----------+---------------+-------------+ LEFT     CompressibilityPhasicitySpontaneityProperties     Thrombus  Aging         +---------+---------------+---------+-----------+---------------+-------------+ CFV                                         unable to                                                                visualize                    +---------+---------------+---------+-----------+---------------+-------------+ SFJ                                         unable to                                                                visualize                    +---------+---------------+---------+-----------+---------------+-------------+ FV Prox                                     unable to                                                                visualize                    +---------+---------------+---------+-----------+---------------+-------------+ FV Mid                           Yes        patent                        +---------+---------------+---------+-----------+---------------+-------------+ FV Distal                        Yes        patent                       +---------+---------------+---------+-----------+---------------+-------------+ PFV                                         unable to  visualize                    +---------+---------------+---------+-----------+---------------+-------------+ POP                     Yes      Yes        patent                       +---------+---------------+---------+-----------+---------------+-------------+ PTV                              Yes        patent                       +---------+---------------+---------+-----------+---------------+-------------+ PERO                             Yes        patent                       +---------+---------------+---------+-----------+---------------+-------------+     Summary: LEFT: - There is no evidence of deep vein thrombosis in the lower extremity. However, portions of this examination were limited- see technologist comments above.  - No cystic structure found in the popliteal fossa.  *See table(s) above for measurements and observations.    Preliminary         Scheduled Meds: . amLODipine  10 mg Oral Daily  . atorvastatin  10 mg Oral Daily  . cloNIDine  0.3 mg Oral TID  . docusate sodium  100 mg Oral BID  . enoxaparin (LOVENOX) injection  85 mg Subcutaneous Q24H  . insulin aspart  0-15 Units Subcutaneous TID WC  . insulin aspart  0-5 Units Subcutaneous QHS  . metroNIDAZOLE  500 mg Oral Q8H  . sodium chloride flush  3 mL Intravenous Q12H   Continuous Infusions: . ceFEPime (MAXIPIME) IV Stopped (07/28/20 4010)  . lactated ringers 100 mL/hr at 07/28/20 0622  . vancomycin 1,750 mg (07/27/20 1703)    Assessment & Plan:   Principal Problem:   Sepsis due to cellulitis [facility]) Active Problems:   Diabetes  mellitus type 2 in obese ([place])   Hyperlipidemia   Morbid obesity ([facility])   OSA (obstructive sleep apnea)   Essential hypertension   AKI (acute kidney injury) ([place])   Stage 3a chronic kidney disease ([place])   1-Sepsis secondary to left lower extremity cellulitis: Patient presented with leukocytosis, tachycardia, tachypnea fever. Blood culture and urine culture no growth to date  Doppler negative for DVT  WBC increased today, afebrile Continue IV cefepime and vancomycin We will DC Flagyl as not needed.  If wbc continues to increase will consult ID   2-AKI on CKD stage IIIa: Present with a creatinine at 2.6, prior creatinine 1.5 Improving with IV fluids, creatinine 2.17 today  Continue to hold Micardis and Lasix  Avoid nephrotoxic medications  Continue IV fluids, but decrease rate to 75 mL hour   3.LE edema- pt on amlodipine, but I dont think all edema due to this. BNP nml, however it may be underestimated with his weight. Will obtain echo.  4-Diabetes type 2: hb A1c 7.0 Holding Glucophage and Glucotrol  Continue R-ISS   holding Glucophage and Glucotrol. Continue with a sliding scale insulin  5-HTN; Continue with Norvasc, clonidinde  Mild hyponatremia:  Improved  with IVF  HLD; Continue with lipitor.   OSA; continue with CPAP.   Morbid Obesity:  Needs weight loss Complicates overall health/management.      DVT prophylaxis: Lovenox Code Status: Full code Family Communication: None at bedside  Status is: Inpatient  Remains inpatient appropriate because:IV treatments appropriate due to intensity of illness or inability to take PO   Dispo: The patient is from: Home              Anticipated d/c is to: TBD              Anticipated d/c date is: 3 days              Patient currently is not medically stable to d/c.            LOS: 2 days   Time spent: 45 minutes with more than 50% on [place], MD [facility] Pager [phone]  [phone]  If 7PM-7AM, please contact night-coverage [email or web address] Password [facility] 07/28/2020, 8:18 AM

## 2020-07-28 NOTE — Plan of Care (Signed)
  Problem: Education: °Goal: Knowledge of General Education information will improve °Description: Including pain rating scale, medication(s)/side effects and non-pharmacologic comfort measures °Outcome: Completed/Met °  °Problem: Health Behavior/Discharge Planning: °Goal: Ability to manage health-related needs will improve °Outcome: Completed/Met °  °Problem: Clinical Measurements: °Goal: Diagnostic test results will improve °Outcome: Completed/Met °Goal: Respiratory complications will improve °Outcome: Completed/Met °Goal: Cardiovascular complication will be avoided °Outcome: Completed/Met °  °

## 2020-07-28 NOTE — Plan of Care (Signed)
°  Problem: Coping: °Goal: Level of anxiety will decrease °Outcome: Progressing °  °

## 2020-07-28 NOTE — Progress Notes (Signed)
Ok to [employer] flagyl per Dr. Kurtis Bushman.  Onnie Boer, PharmD, BCIDP, AAHIVP, CPP Infectious Disease Pharmacist 07/28/2020 2:22 PM

## 2020-07-28 NOTE — Procedures (Signed)
Came bedside for echo, but HR is too high for accurate results at this time.   Nurse reports there is nothing to bring down HR at this time.

## 2020-07-29 ENCOUNTER — Inpatient Hospital Stay (HOSPITAL_COMMUNITY): Payer: Medicare HMO

## 2020-07-29 DIAGNOSIS — L03116 Cellulitis of left lower limb: Secondary | ICD-10-CM

## 2020-07-29 DIAGNOSIS — E119 Type 2 diabetes mellitus without complications: Secondary | ICD-10-CM

## 2020-07-29 DIAGNOSIS — L0390 Cellulitis, unspecified: Secondary | ICD-10-CM | POA: Diagnosis not present

## 2020-07-29 DIAGNOSIS — R9431 Abnormal electrocardiogram [ECG] [EKG]: Secondary | ICD-10-CM | POA: Diagnosis not present

## 2020-07-29 DIAGNOSIS — A419 Sepsis, unspecified organism: Principal | ICD-10-CM

## 2020-07-29 DIAGNOSIS — E785 Hyperlipidemia, unspecified: Secondary | ICD-10-CM | POA: Diagnosis not present

## 2020-07-29 DIAGNOSIS — I10 Essential (primary) hypertension: Secondary | ICD-10-CM

## 2020-07-29 DIAGNOSIS — N183 Chronic kidney disease, stage 3 unspecified: Secondary | ICD-10-CM

## 2020-07-29 DIAGNOSIS — G4733 Obstructive sleep apnea (adult) (pediatric): Secondary | ICD-10-CM

## 2020-07-29 DIAGNOSIS — N179 Acute kidney failure, unspecified: Secondary | ICD-10-CM

## 2020-07-29 LAB — BASIC METABOLIC PANEL
Anion gap: 16 — ABNORMAL HIGH (ref 5–15)
BUN: 35 mg/dL — ABNORMAL HIGH (ref 8–23)
CO2: 18 mmol/L — ABNORMAL LOW (ref 22–32)
Calcium: 8.3 mg/dL — ABNORMAL LOW (ref 8.9–10.3)
Chloride: 103 mmol/L (ref 98–111)
Creatinine, Ser: 1.94 mg/dL — ABNORMAL HIGH (ref 0.61–1.24)
GFR, Estimated: 38 mL/min — ABNORMAL LOW (ref >60)
Glucose, Bld: 194 mg/dL — ABNORMAL HIGH (ref 70–99)
Potassium: 3.9 mmol/L (ref 3.5–5.1)
Sodium: 137 mmol/L (ref 135–145)

## 2020-07-29 LAB — ECHOCARDIOGRAM COMPLETE
Height: 72 in
S' Lateral: 4.9 cm
Weight: 6162.3 oz

## 2020-07-29 LAB — CBC
HCT: 29.5 % — ABNORMAL LOW (ref 39.0–52.0)
Hemoglobin: 9.7 g/dL — ABNORMAL LOW (ref 13.0–17.0)
MCH: 30 pg (ref 26.0–34.0)
MCHC: 32.9 g/dL (ref 30.0–36.0)
MCV: 91.3 fL (ref 80.0–100.0)
Platelets: 260 10*3/uL (ref 150–400)
RBC: 3.23 MIL/uL — ABNORMAL LOW (ref 4.22–5.81)
RDW: 14.7 % (ref 11.5–15.5)
WBC: 17.2 10*3/uL — ABNORMAL HIGH (ref 4.0–10.5)
nRBC: 0 % (ref 0.0–0.2)

## 2020-07-29 LAB — GLUCOSE, CAPILLARY
Glucose-Capillary: 172 mg/dL — ABNORMAL HIGH (ref 70–99)
Glucose-Capillary: 189 mg/dL — ABNORMAL HIGH (ref 70–99)
Glucose-Capillary: 181 mg/dL — ABNORMAL HIGH (ref 70–99)
Glucose-Capillary: 172 mg/dL — ABNORMAL HIGH (ref 70–99)

## 2020-07-29 LAB — BRAIN NATRIURETIC PEPTIDE: B Natriuretic Peptide: 356.8 pg/mL — ABNORMAL HIGH (ref 0.0–100.0)

## 2020-07-29 MED ORDER — FUROSEMIDE 10 MG/ML IJ SOLN: 40.0000 mg | Freq: Once | INTRAMUSCULAR | Status: DC

## 2020-07-29 MED ORDER — PERFLUTREN LIPID MICROSPHERE
1.0000 mL | INTRAVENOUS | Status: AC | PRN
  Administered 2020-07-29: 3 mL via INTRAVENOUS
  Filled 2020-07-29: qty 10

## 2020-07-29 MED ORDER — METOPROLOL SUCCINATE ER 25 MG PO TB24
12.5000 mg | ORAL_TABLET | Freq: Every day | ORAL | Status: DC
  Administered 2020-07-29 – 2020-07-30 (×2): 12.5 mg via ORAL
  Filled 2020-07-29 (×2): qty 1

## 2020-07-29 NOTE — Plan of Care (Signed)
  Problem: Nutrition: Goal: Adequate nutrition will be maintained Outcome: Completed/Met   Problem: Coping: Goal: Level of anxiety will decrease Outcome: Completed/Met   Problem: Elimination: Goal: Will not experience complications related to urinary retention Outcome: Completed/Met   Problem: Skin Integrity: Goal: Risk for impaired skin integrity will decrease Outcome: Completed/Met

## 2020-07-29 NOTE — Progress Notes (Signed)
   07/29/20 1634  Assess: MEWS Score  Temp (!) 101.8 F (38.8 C)  BP (!) 149/86  Pulse Rate (!) 123  Resp 20  SpO2 96 %  O2 Device CPAP  Assess: MEWS Score  MEWS Temp 2  MEWS Systolic 0  MEWS Pulse 2  MEWS RR 0  MEWS LOC 0  MEWS Score 4  MEWS Score Color Red  Assess: if the MEWS score is Yellow or Red  Were vital signs taken at a resting state? Yes  Focused Assessment Change from prior assessment (see assessment flowsheet)  Early Detection of Sepsis Score *See Row Information* Low  Alerted Dr. Kurtis Bushman of red MEWS and call from telemonitoring that pt had st elevation ,Dr. Kurtis Bushman ordered EKG will be up to assess patient.   Yamili Lichtenwalner, RN

## 2020-07-29 NOTE — Progress Notes (Signed)
Rt placed pt on home cpap at this time.  RT will continue to monitor.

## 2020-07-29 NOTE — Progress Notes (Signed)
PT Cancellation Note  Patient Details Name: Gerald Day MRN: [id number] DOB: 19-Oct-1955   Cancelled Treatment:    Reason Eval/Treat Not Completed: Medical issues which prohibited therapy  Spoke with RN and pt getting 12 lead EKG due to ST changes.  Will hold PT at this time. Abran Richard, PT Acute Rehab Services Pager [phone] [facility] [phone]   Karlton Lemon 07/29/2020, 5:05 PM

## 2020-07-29 NOTE — Progress Notes (Signed)
Patient ID: Gerald Day, male   DOB: 1956-06-24, 64 y.o.   MRN: [id number] Was called by nursing that patient was febrile, temp 101.8, tachycardic. Pt seen . Pt lying in bed with cpap resting. Is asx.  LLE appears with worsening of erythema extending posteriorly, has it elevated.  -repeat bcx,  Cxr, ucx -consulted ID, spoke to Dr. Juleen China, he will see pt, also recommended ct leg to r/o underlying abscess. -continue current abx

## 2020-07-29 NOTE — Consult Note (Signed)
[facility] for Infectious Disease    Date of Admission:  07/26/2020     Total days of antibiotics: 4  Current antibiotics: Day 4 cefepime Day 4 vancomycin  Reason for Consult: cellulitis    Referring Physician: Dr Kurtis Bushman  ASSESSMENT AND PLAN:    # Left lower extremity cellulitis -- suspect most likely slow response thus far to abx given extensive edema in lower extremity.  MRI initially did not show deeper infection of the foot and ankle.   -- continue vancomycin and cefepime for now -- if not improving over the next day or so will likely switch to daptomycin in case his vanco is subtherapeutic -- repeat blood cultures -- CT left leg to ensure no new abscess or deeper infection  Principal Problem:   Sepsis due to cellulitis [facility]) Active Problems:   Diabetes mellitus type 2 in obese (HCC)   Hyperlipidemia   Morbid obesity (HCC)   OSA (obstructive sleep apnea)   Essential hypertension   AKI (acute kidney injury) ([place])   Stage 3a chronic kidney disease (HCC)   HPI:   Gerald Day is a 64 y.o. male with a past medical history significant for hypertension, hyperlipidemia, diabetes, CHF, obesity, history of right lower extremity cellulitis who presented November 19 with lower extremity cellulitis of the left leg.  We have been consulted for further recommendation given his lack of improvement, persistent leukocytosis, and ongoing fevers despite broad spectrum abx.  On admission he had a white blood cell count of 18.4 which initially improved to 15.  However, it increased to 19.3 yesterday and today it is back down to 17.2.  He also presented with acute on chronic kidney injury with a creatinine of 2.6.  Fortunately this has improved during his hospitalization and today his creatinine was 1.9.  Blood cultures from admission are no growth x2.  He was initially treated with vancomycin, cefepime and metronidazole.  Continues today on cefepime and vancomycin.  MRI of the left  ankle on admission showed extensive subcutaneous soft tissue swelling and edema suggesting severe cellulitis, however, there is no fluid collections or evidence of septic arthritis or osteomyelitis.  Patient continues to be afebrile and tachycardic with a temperature 101.8 F this afternoon.  He has no other localizing signs or symptoms of infection.  No dysuria, no cough, no shortness of breath, no diarrhea, no abdominal pain.  He reports feeling fairly well tonight and that the pain in his leg has improved.  He is tolerating me touching his leg whereas previously this would not be the case.  He thinks this started with a small cut on his great toe.    Past Medical History:  Diagnosis Date  . Arthritis    IN KNEES  . Cellulitis 10/26/2013   LOWER RT EXTREMITY  . CELLULITIS/ABSCESS, LEG 06/27/2007  . COLONIC POLYPS   . CONGESTIVE HEART FAILURE   . DIABETES MELLITUS, TYPE II   . DIVERTICULOSIS, COLON   . ERECTILE DYSFUNCTION, ORGANIC   . GOUT NOS   . Headache   . HYPERLIPIDEMIA   . HYPERTENSION   . LOW BACK PAIN   . Morbid obesity ([place])   . SLEEP APNEA, OBSTRUCTIVE    USES CPAP    Social History   Tobacco Use  . Smoking status: Never Smoker  . Smokeless tobacco: Never Used  Vaping Use  . Vaping Use: Never used  Substance Use Topics  . Alcohol use: Not Currently    Alcohol/week: 3.0 -  4.0 standard drinks    Types: 3 - 4 Cans of beer per week    Comment: hardly any; h/o heavy use  . Drug use: No    Family History  Problem Relation Age of Onset  . Cardiomyopathy Father   . Asthma Other     Allergies  Allergen Reactions  . Penicillins Other (See Comments)    Unknown childhood allergic reaction Has patient had a PCN reaction causing immediate rash, facial/tongue/throat swelling, SOB or lightheadedness with hypotension: Unknown Has patient had a PCN reaction causing severe rash involving mucus membranes or skin necrosis: Unknown Has patient had a PCN reaction that required  hospitalization: No Has patient had a PCN reaction occurring within the last 10 years: No If all of the above answers are "NO", then may proceed with Cephalosporin use.   . Tizanidine Other (See Comments)    07/26/20: Pt does not recognize drug and does not remember being allergic to it.    Review of Systems  Constitutional: Positive for fever.  HENT: Negative.   Respiratory: Negative.   Cardiovascular: Negative.   Gastrointestinal: Negative.   Genitourinary: Negative.   Musculoskeletal: Negative.   Skin:       + erythema of left leg  Neurological: Negative.   Psychiatric/Behavioral: Negative.     All other systems reviewed and are negative.  OBJECTIVE:   Blood pressure (!) 146/66, pulse (!) 121, temperature 99.8 F (37.7 C), resp. rate 20, height 6' (1.829 m), weight (!) 174.7 kg, SpO2 96 %. Body mass index is 52.23 kg/m.   General: Patient is a well-developed and well-nourished, in no acute distress and cooperative with exam.  Head: Normocephalic and atraumatic. Eyes: EOMI, conjunctivae normal, No scleral icterus.  Neck: Supple, trachea midline, normal ROM. Cardiovascular: Tachycardic, no murmurs.  3+ lower extremity edema.  Pulmonary: Normal respiratory effort, lungs CTA bilaterally without wheezes or crackles. Abdominal: Soft, non-tender, non-distended, no splenomegaly, no hepatomegaly.  No rebound or guarding present.  MSK/Extremities: Skin thickening of lower extremity with evidence of chronic venous stasis.  Small scratch on left great toe with out warmth or erythema Neurological: A&O x3, Strength is normal and symmetric bilaterally, cranial nerve II-XII are grossly intact, no focal motor deficit, sensory intact to light touch bilaterally.  Skin: Left leg is warm to the touch but minimally tender with erythema noted below his knee and posteriorly.  Psychiatric: Normal mood and affect. speech and behavior is normal. Cognition and memory are normal.    Lab Results &  Microbiology Lab Results  Component Value Date   WBC 17.2 (H) 07/29/2020   HGB 9.7 (L) 07/29/2020   HCT 29.5 (L) 07/29/2020   MCV 91.3 07/29/2020   PLT 260 07/29/2020    Lab Results  Component Value Date   NA 137 07/29/2020   K 3.9 07/29/2020   CO2 18 (L) 07/29/2020   GLUCOSE 194 (H) 07/29/2020   BUN 35 (H) 07/29/2020   CREATININE 1.94 (H) 07/29/2020   CALCIUM 8.3 (L) 07/29/2020   GFRNONAA 38 (L) 07/29/2020   GFRAA >60 02/25/2018    Lab Results  Component Value Date   ALT 22 07/26/2020   AST 21 07/26/2020   ALKPHOS 87 07/26/2020   BILITOT 1.3 (H) 07/26/2020     I have reviewed the micro and lab results in Epic.  Imaging ECHOCARDIOGRAM COMPLETE  Result Date: 07/29/2020    ECHOCARDIOGRAM REPORT   [name] Date of Exam: 07/29/2020 Medical Rec #:  [id number]        Height:       72.0 in Accession #:    [id number]       Weight:       385.1 lb Date of Birth:  01-Sep-1956        BSA:          2.815 m Patient Age:    64 years         BP:           144/93 mmHg Patient Gender: M                HR:           120 bpm. Exam Location:  Inpatient Procedure: 2D Echo and Intracardiac Opacification Agent Indications:    Abnormal ECG  History:        Patient has prior history of Echocardiogram examinations. CHF;                 Risk Factors:Hypertension, Dyslipidemia and Diabetes.  Sonographer:    Mikki Santee RDCS (AE) Referring Phys: [id number] [place]  1. Left ventricular ejection fraction, by estimation, is 55 to 60%. The left ventricle has normal function. The left ventricle has no regional wall motion abnormalities. There is mild left ventricular hypertrophy. Indeterminate diastolic filling due to E-A fusion.  2. Right ventricular systolic function was not well visualized. The right ventricular size is not well visualized.  3. Left atrial size was mildly dilated.  4. Right atrial size was mildly dilated.  5. The mitral valve is grossly normal. Trivial  mitral valve regurgitation.  6. The aortic valve is tricuspid. Aortic valve regurgitation is not visualized.  7. Aortic dilatation noted. There is mild dilatation of the aortic root, measuring 41 mm. There is mild dilatation of the ascending aorta, measuring 41 mm. Comparison(s): No prior Echocardiogram. FINDINGS  Left Ventricle: Left ventricular ejection fraction, by estimation, is 55 to 60%. The left ventricle has normal function. The left ventricle has no regional wall motion abnormalities. Definity contrast agent was given IV to delineate the left ventricular  endocardial borders. The left ventricular internal cavity size was normal in size. There is mild left ventricular hypertrophy. Indeterminate diastolic filling due to E-A fusion. Right Ventricle: The right ventricular size is not well visualized. Right vetricular wall thickness was not well visualized. Right ventricular systolic function was not well visualized. Left Atrium: Left atrial size was mildly dilated. Right Atrium: Right atrial size was mildly dilated. Pericardium: There is no evidence of pericardial effusion. Mitral Valve: The mitral valve is grossly normal. Trivial mitral valve regurgitation. Tricuspid Valve: The tricuspid valve is grossly normal. Tricuspid valve regurgitation is trivial. Aortic Valve: The aortic valve is tricuspid. Aortic valve regurgitation is not visualized. Pulmonic Valve: The pulmonic valve was normal in structure. Pulmonic valve regurgitation is not visualized. Aorta: Aortic dilatation noted. There is mild dilatation of the aortic root, measuring 41 mm. There is mild dilatation of the ascending aorta, measuring 41 mm. IAS/Shunts: No atrial level shunt detected by color flow Doppler.  LEFT VENTRICLE PLAX 2D LVIDd:         6.60 cm LVIDs:         4.90 cm LV PW:         1.20 cm LV IVS:        1.10 cm LVOT diam:     2.60 cm LV SV:         95 LV SV Index:  34 LVOT Area:     5.31 cm  RIGHT VENTRICLE TAPSE (M-mode): 1.3 cm LEFT  ATRIUM              Index       RIGHT ATRIUM           Index LA diam:        4.90 cm  1.74 cm/m  RA Area:     29.20 cm LA Vol (A2C):   76.5 ml  27.18 ml/m RA Volume:   96.20 ml  34.18 ml/m LA Vol (A4C):   102.0 ml 36.24 ml/m LA Biplane Vol: 92.1 ml  32.72 ml/m  AORTIC VALVE LVOT Vmax:   94.20 cm/s LVOT Vmean:  69.100 cm/s LVOT VTI:    0.178 m  AORTA Ao Root diam: 4.10 cm  SHUNTS Systemic VTI:  0.18 m Systemic Diam: 2.60 cm Lyman Bishop MD Electronically signed by Lyman Bishop MD Signature Date/Time: 07/29/2020/12:52:55 PM    Final      Imaging was independently reviewed in Epic.    [facility] for [facility]ious [facility] [phone] pager 07/29/2020, 5:33 PM

## 2020-07-29 NOTE — Progress Notes (Signed)
PROGRESS NOTE    Gerald Day  DXA:[id number] DOB: Apr 09, 1956 DOA: 07/26/2020 PCP: Biagio Borg, MD    Brief Narrative:  64 year old with past medical history significant of OSA on CPAP, morbid obesity, hypertension, HLD, diabetes, CHF, history of right lower extremity cellulitis presenting with lower extremity cellulitis.  Patient also presented with shortness of breath since 3 days, lower extremity edema.  Patient present with lower extremity cellulitis, AKI with a creatinine on admission at 2.6, prior creatinine level 1.5.  11/22-tele sinus tachycardia. Prelim [place] no dvt but limited in some areas.  Consultants:     Procedures:   Antimicrobials:   Vancomycin, cefepime  Metronidazole d/c'd   Subjective: No new complaints. Feels LE very swollen . Tries to keep legs elevated.   Objective: Vitals:   07/28/20 1634 07/28/20 2018 07/29/20 0011 07/29/20 0503  BP: (!) 142/84 (!) 141/85 137/83 132/86  Pulse: (!) 120 (!) 118 (!) 113 (!) 119  Resp: (!) 25 19 18 19   Temp: 100.2 F (37.9 C) 99.2 F (37.3 C) 98.6 F (37 C) 98.8 F (37.1 C)  TempSrc: Oral Oral Oral Oral  SpO2: 95% 99% 99% 97%  Weight:  (!) 174.7 kg    Height:        Intake/Output Summary (Last 24 hours) at 07/29/2020 2094 Last data filed at 07/29/2020 0600 Gross per 24 hour  Intake 2454.67 ml  Output 1100 ml  Net 1354.67 ml   Filed Weights   07/28/20 2018  Weight: (!) 174.7 kg    Examination: Nad, calm cta , no wheezing Regular s1/s2 distant heart sound, no murmur Soft obese, benign, +bs 3+ pitting edema Lt >>RT, Lt shin erythema midly improved, still warm to touch     Data Reviewed: I have personally reviewed following labs and imaging studies  CBC: Recent Labs  Lab 07/26/20 1329 07/27/20 0445 07/28/20 0454 07/29/20 0310  WBC 18.4* 15.2* 19.3* 17.2*  HGB 11.7* 9.4* 9.4* 9.7*  HCT 35.9* 28.9* 29.0* 29.5*  MCV 93.2 91.5 92.1 91.3  PLT 304 205 239 709   Basic Metabolic  Panel: Recent Labs  Lab 07/26/20 1329 07/26/20 1345 07/27/20 0445 07/28/20 0454 07/29/20 0310  NA 135  --  134* 136 137  K 4.0  --  4.2 4.0 3.9  CL 101  --  103 105 103  CO2 21*  --  21* 19* 18*  GLUCOSE 179*  --  191* 170* 194*  BUN 35*  --  37* 39* 35*  CREATININE 2.60*  --  2.41* 2.17* 1.94*  CALCIUM 8.8*  --  8.2* 8.3* 8.3*  MG  --  1.7  --   --   --    GFR: Estimated Creatinine Clearance: 62.5 mL/min (A) (by C-G formula based on SCr of 1.94 mg/dL (H)). Liver Function Tests: Recent Labs  Lab 07/26/20 1450  AST 21  ALT 22  ALKPHOS 87  BILITOT 1.3*  PROT 6.9  ALBUMIN 2.7*   No results for input(s): LIPASE, AMYLASE in the last 168 hours. No results for input(s): AMMONIA in the last 168 hours. Coagulation Profile: Recent Labs  Lab 07/26/20 1345  INR 1.4*   Cardiac Enzymes: No results for input(s): CKTOTAL, CKMB, CKMBINDEX, TROPONINI in the last 168 hours. BNP (last 3 results) No results for input(s): PROBNP in the last 8760 hours. HbA1C: No results for input(s): HGBA1C in the last 72 hours. CBG: Recent Labs  Lab 07/28/20 0640 07/28/20 1136 07/28/20 1636 07/28/20 2019 07/29/20 6283  GLUCAP  163* 195* 175* 182* 172*   Lipid Profile: No results for input(s): CHOL, HDL, LDLCALC, TRIG, CHOLHDL, LDLDIRECT in the last 72 hours. Thyroid Function Tests: No results for input(s): TSH, T4TOTAL, FREET4, T3FREE, THYROIDAB in the last 72 hours. Anemia Panel: No results for input(s): VITAMINB12, FOLATE, FERRITIN, TIBC, IRON, RETICCTPCT in the last 72 hours. Sepsis Labs: Recent Labs  Lab 07/26/20 1450 07/26/20 1736 07/26/20 1746 07/26/20 2228  PROCALCITON  --  1.64  --   --   LATICACIDVEN 2.1*  --  1.5 1.3    Recent Results (from the past 240 hour(s))  Blood Culture (routine x 2)     Status: None (Preliminary result)   Collection Time: 07/26/20  2:50 PM   Specimen: BLOOD  Result Value Ref Range Status   Specimen Description BLOOD RIGHT ANTECUBITAL  Final    Special Requests   Final    BLOOD Blood Culture results may not be optimal due to an inadequate volume of blood received in culture bottles   Culture   Final    NO GROWTH 2 DAYS Performed at [facility] [address], [place], [place] [address]    Report Status PENDING  Incomplete  Blood Culture (routine x 2)     Status: None (Preliminary result)   Collection Time: 07/26/20  3:46 PM   Specimen: BLOOD  Result Value Ref Range Status   Specimen Description BLOOD SITE NOT SPECIFIED  Final   Special Requests   Final    BOTTLES DRAWN AEROBIC AND ANAEROBIC Blood Culture adequate volume   Culture   Final    NO GROWTH 2 DAYS Performed at [facility], [address], [place], [place] [address]    Report Status PENDING  Incomplete  Respiratory Panel by RT PCR (Flu A&B, Covid) - Nasopharyngeal Swab     Status: None   Collection Time: 07/26/20  5:54 PM   Specimen: Nasopharyngeal Swab; Nasopharyngeal(NP) swabs in vial transport medium  Result Value Ref Range Status   SARS Coronavirus 2 by RT PCR NEGATIVE NEGATIVE Final    Comment: (NOTE) SARS-CoV-2 target nucleic acids are NOT DETECTED.  The SARS-CoV-2 RNA is generally detectable in upper respiratoy specimens during the acute phase of infection. The lowest concentration of SARS-CoV-2 viral copies this assay can detect is 131 copies/mL. A negative result does not preclude SARS-Cov-2 infection and should not be used as the sole basis for treatment or other patient management decisions. A negative result may occur with  improper specimen collection/handling, submission of specimen other than nasopharyngeal swab, presence of viral mutation(s) within the areas targeted by this assay, and inadequate number of viral copies (<131 copies/mL). A negative result must be combined with clinical observations, patient history, and epidemiological information. The expected result is Negative.  Fact Sheet for Patients:   PinkCheek.be  Fact Sheet for Healthcare Providers:  GravelBags.it  This test is no t yet approved or cleared by the [place] FDA and  has been authorized for detection and/or diagnosis of SARS-CoV-2 by FDA under an Emergency Use Authorization (EUA). This EUA will remain  in effect (meaning this test can be used) for the duration of the COVID-19 declaration under Section 564(b)(1) of the Act, 21 U.S.C. section 360bbb-3(b)(1), unless the authorization is terminated or revoked sooner.     Influenza A by PCR NEGATIVE NEGATIVE Final   Influenza B by PCR NEGATIVE NEGATIVE Final    Comment: (NOTE) The Xpert Xpress SARS-CoV-2/FLU/RSV assay is intended as an  aid in  the diagnosis of influenza from Nasopharyngeal swab specimens and  should not be used as a sole basis for treatment. Nasal washings and  aspirates are unacceptable for Xpert Xpress SARS-CoV-2/FLU/RSV  testing.  Fact Sheet for Patients: PinkCheek.be  Fact Sheet for Healthcare Providers: GravelBags.it  This test is not yet approved or cleared by the [place] FDA and  has been authorized for detection and/or diagnosis of SARS-CoV-2 by  FDA under an Emergency Use Authorization (EUA). This EUA will remain  in effect (meaning this test can be used) for the duration of the  Covid-19 declaration under Section 564(b)(1) of the Act, 21  U.S.C. section 360bbb-3(b)(1), unless the authorization is  terminated or revoked. Performed at [facility], [place] [address] [place], [place] [address]   Urine Culture     Status: None   Collection Time: 07/27/20  1:45 PM   Specimen: Urine, Random  Result Value Ref Range Status   Specimen Description URINE, RANDOM  Final   Special Requests NONE  Final   Culture   Final    NO GROWTH Performed at [facility], [place] [address] [place], [place] [address]     Report Status 07/28/2020 FINAL  Final         Radiology Studies: No results found.      Scheduled Meds: . amLODipine  10 mg Oral Daily  . atorvastatin  10 mg Oral Daily  . cloNIDine  0.3 mg Oral TID  . docusate sodium  100 mg Oral BID  . enoxaparin (LOVENOX) injection  85 mg Subcutaneous Q24H  . insulin aspart  0-15 Units Subcutaneous TID WC  . insulin aspart  0-5 Units Subcutaneous QHS  . sodium chloride flush  3 mL Intravenous Q12H   Continuous Infusions: . ceFEPime (MAXIPIME) IV 2 g (07/29/20 0238)  . lactated ringers 75 mL/hr at 07/28/20 1713  . vancomycin 1,750 mg (07/28/20 1708)    Assessment & Plan:   Principal Problem:   Sepsis due to cellulitis [facility]) Active Problems:   Diabetes mellitus type 2 in obese ([place])   Hyperlipidemia   Morbid obesity (HCC)   OSA (obstructive sleep apnea)   Essential hypertension   AKI (acute kidney injury) ([place])   Stage 3a chronic kidney disease ([place])   1-Sepsis secondary to left lower extremity cellulitis: Patient presented with leukocytosis, tachycardia, tachypnea fever. Blood culture and urine culture no growth to date  Doppler negative for DVT , limited in some areas 11/22-Wbc decreasing, afebrile Mild improvement Continue iv cefepime and vanco Keep legs elevated, stressed this to pt.     2-AKI on CKD stage IIIa: Present with a creatinine at 2.6, prior creatinine 1.5 11/22-improving with IVF, but will dc as pt LE edema appears worse Hold micardis and lasix for now May need lasix prn Avoid nephrotoxic medications May need to consult nephrology if have to use lasix /worsening renal function   3.LE edema- pt on amlodipine, but I dont think all edema due to this. BNP nml, however it may be underestimated with his weight. Echo with nml EF. Aortic dilatation 20mm. Mild LVH, indeterminate diastolic filling. However with LVF likely has some diastolic dysfunction.  4-Diabetes type 2: hb A1c 7.0 BG stable Holding  Glucophage and glucotrol Continue RISS    5-HTN; Continue with Norvasc, clonidinde  Mild hyponatremia:  Improved with ivf Will dc ivf  HLD; Continue with lipitor.   OSA; continue with CPAP.   Morbid Obesity:  Needs weight  loss Complicates overall health/management.   Pt/OT consult   DVT prophylaxis: Lovenox Code Status: Full code Family Communication: None at bedside  Status is: Inpatient  Remains inpatient appropriate because:IV treatments appropriate due to intensity of illness or inability to take PO   Dispo: The patient is from: Home              Anticipated d/c is to: TBD              Anticipated d/c date is: 3 days              Patient currently is not medically stable to d/c.            LOS: 3 days   Time spent: 45 minutes with more than 50% on [name], MD [facility] Pager [phone]  If 7PM-7AM, please contact night-coverage [email or web address] Password Gerald Day 07/29/2020, 8:32 AM

## 2020-07-29 NOTE — Progress Notes (Signed)
  Echocardiogram 2D Echocardiogram has been performed.  Jennette Dubin 07/29/2020, 12:00 PM

## 2020-07-30 ENCOUNTER — Encounter (HOSPITAL_COMMUNITY): Payer: Self-pay | Admitting: Internal Medicine

## 2020-07-30 DIAGNOSIS — L0390 Cellulitis, unspecified: Secondary | ICD-10-CM

## 2020-07-30 DIAGNOSIS — L03116 Cellulitis of left lower limb: Secondary | ICD-10-CM | POA: Diagnosis not present

## 2020-07-30 DIAGNOSIS — I4892 Unspecified atrial flutter: Secondary | ICD-10-CM

## 2020-07-30 DIAGNOSIS — I10 Essential (primary) hypertension: Secondary | ICD-10-CM | POA: Diagnosis not present

## 2020-07-30 DIAGNOSIS — E785 Hyperlipidemia, unspecified: Secondary | ICD-10-CM | POA: Diagnosis not present

## 2020-07-30 DIAGNOSIS — A419 Sepsis, unspecified organism: Secondary | ICD-10-CM | POA: Diagnosis not present

## 2020-07-30 LAB — URINALYSIS, ROUTINE W REFLEX MICROSCOPIC
Bilirubin Urine: NEGATIVE
Glucose, UA: 50 mg/dL — AB
Ketones, ur: 5 mg/dL — AB
Leukocytes,Ua: NEGATIVE
Nitrite: NEGATIVE
Protein, ur: >=300 mg/dL — AB
Specific Gravity, Urine: 1.015 (ref 1.005–1.030)
pH: 5 (ref 5.0–8.0)

## 2020-07-30 LAB — BASIC METABOLIC PANEL
Anion gap: 13 (ref 5–15)
BUN: 33 mg/dL — ABNORMAL HIGH (ref 8–23)
CO2: 20 mmol/L — ABNORMAL LOW (ref 22–32)
Calcium: 8.2 mg/dL — ABNORMAL LOW (ref 8.9–10.3)
Chloride: 104 mmol/L (ref 98–111)
Creatinine, Ser: 1.84 mg/dL — ABNORMAL HIGH (ref 0.61–1.24)
GFR, Estimated: 40 mL/min — ABNORMAL LOW (ref >60)
Glucose, Bld: 191 mg/dL — ABNORMAL HIGH (ref 70–99)
Potassium: 4.5 mmol/L (ref 3.5–5.1)
Sodium: 137 mmol/L (ref 135–145)

## 2020-07-30 LAB — GLUCOSE, CAPILLARY
Glucose-Capillary: 168 mg/dL — ABNORMAL HIGH (ref 70–99)
Glucose-Capillary: 178 mg/dL — ABNORMAL HIGH (ref 70–99)
Glucose-Capillary: 193 mg/dL — ABNORMAL HIGH (ref 70–99)
Glucose-Capillary: 176 mg/dL — ABNORMAL HIGH (ref 70–99)
Glucose-Capillary: 178 mg/dL — ABNORMAL HIGH (ref 70–99)

## 2020-07-30 MED ORDER — FUROSEMIDE 10 MG/ML IJ SOLN: 40.0000 mg | Freq: Every day | INTRAMUSCULAR | Status: DC

## 2020-07-30 MED ORDER — FUROSEMIDE 10 MG/ML IJ SOLN
40.0000 mg | Freq: Once | INTRAMUSCULAR | Status: AC
  Administered 2020-07-30: 40 mg via INTRAVENOUS
  Filled 2020-07-30: qty 4

## 2020-07-30 MED ORDER — METOPROLOL SUCCINATE ER 25 MG PO TB24
25.0000 mg | ORAL_TABLET | Freq: Two times a day (BID) | ORAL | Status: DC
  Administered 2020-07-30 – 2020-07-31 (×2): 25 mg via ORAL
  Filled 2020-07-30 (×3): qty 1

## 2020-07-30 MED ORDER — CEFAZOLIN SODIUM-DEXTROSE 2-4 GM/100ML-% IV SOLN
2.0000 g | Freq: Three times a day (TID) | INTRAVENOUS | Status: AC
  Administered 2020-07-30 – 2020-08-08 (×29): 2 g via INTRAVENOUS
  Filled 2020-07-30 (×34): qty 100

## 2020-07-30 NOTE — Progress Notes (Signed)
Pt has home CPAP in the room.  Pt already placed himself on CPAP.

## 2020-07-30 NOTE — Consult Note (Addendum)
Cardiology Consultation:   Patient ID: Gerald Day; [id number]; 11/15/1955   Admit date: 07/26/2020 Date of Consult: 07/30/2020  Primary Care Provider: Biagio Borg, MD Primary Cardiologist: Minus Breeding, MD New Primary Electrophysiologist:  None   Patient Profile:   Gerald Day is a 64 y.o. male with a hx of DM, HTN, HLD, OA, OSA on CPAP, morbid obesity, CHF listed but has not had echo before and never admitted for same, who is being seen today for the evaluation of Afib at the request of Dr Kurtis Bushman.  History of Present Illness:   [name] was admitted 11/19 with sepsis from LLE skin cellulitis, severe LE edema, AKI w/ Cr 2.6, Lasix held.  LE dopplers neg DVT but unable to get all images well. Great toe wound seen by wound care. ID saw as well. Echo obtained and EF normal.   On 11/22, ?Aflutter >> cards consult.  [name] was in his usual state of health until about a week ago.  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  He enjoys this.  He is in the water for at least an hour and does other things as well.  He has not had chest pain or shortness of breath.  He has not had any palpitations, and denies presyncope or syncope.  When told that he was in atrial flutter, he was completely unaware of it.  He was also unaware of the wound on his left great toe.  He does not remember having any fevers or chills.  He checks his blood pressure on a fairly regular basis with a wrist cuff.  Most of the readings are elevated with a systolic greater than 919 and at times the diastolic is greater than 166.  He thinks his heart rate generally runs in the 80s and 90s.  He does not remember any unusually elevated heart rates.  The major thing that happened was that he noticed he was getting more and more short of breath with exertion.  He did yard work the other day for about 15 minutes and was so short of breath he had to  stop.  This was entirely new for him.  He was not really aware of any lower extremity edema although his left leg is grossly swollen and reddened.  His son was there and they went to urgent care and from there he came to the hospital and was admitted.  Since admission, he feels better.  He uses his CPAP consistently at night.  He is still in atrial flutter, with the rate improved and is still completely unaware of it.  He has never had chest pain.   Past Medical History:  Diagnosis Date   Arthritis    IN KNEES   Cellulitis 10/26/2013   LOWER RT EXTREMITY   CELLULITIS/ABSCESS, LEG 06/27/2007   COLONIC POLYPS    CONGESTIVE HEART FAILURE    DIABETES MELLITUS, TYPE II    DIVERTICULOSIS, COLON    ERECTILE DYSFUNCTION, ORGANIC    GOUT NOS    Headache    HYPERLIPIDEMIA    HYPERTENSION    LOW BACK PAIN    Morbid obesity ([place])    SLEEP APNEA, OBSTRUCTIVE    USES CPAP    Past Surgical History:  Procedure Laterality Date   APPENDECTOMY     FOOT SURGERY     LT   TENOTOMY / FLEXOR TENDON TRANSFER Right 03/15/2015   Procedure: TENOTOMY  HT REPAIR/ULCER DEBRIDEMENT/GRAFT PREP/ACELL GRAFT APPLICATION;  Surgeon: Jana Half, DPM;  Location: [facility];  Service: Podiatry;  Laterality: Right;  HALLUX     Prior to Admission medications   Medication Sig Start Date End Date Taking? Authorizing Provider  amLODipine (NORVASC) 10 MG tablet Take 1 tablet (10 mg total) by mouth daily. 07/04/20  Yes Biagio Borg, MD  atorvastatin (LIPITOR) 10 MG tablet TAKE 1 TABLET (10 MG TOTAL) BY MOUTH DAILY. 07/04/20  Yes Biagio Borg, MD  Cholecalciferol (THERA-D 2000) 50 MCG (2000 UT) TABS 1 tab by mouth once daily 06/28/20  Yes Biagio Borg, MD  cloNIDine (CATAPRES) 0.3 MG tablet Take 0.3 mg by mouth in the morning, at noon, and at bedtime.   Yes [provider]  furosemide (LASIX) 80 MG tablet Take 1 tablet (80 mg total) by mouth daily as needed. 07/04/20  Yes Biagio Borg, MD   glipiZIDE (GLUCOTROL XL) 5 MG 24 hr tablet TAKE 1 TABLET BY MOUTH EVERY DAY WITH BREAKFAST 07/04/20  Yes Biagio Borg, MD  indomethacin (INDOCIN) 50 MG capsule TAKE 1 CAPSULE (50 MG TOTAL) BY MOUTH 3 (THREE) TIMES DAILY AS NEEDED. Patient taking differently: Take 50 mg by mouth 3 (three) times daily as needed (gout).  02/04/20  Yes Biagio Borg, MD  metFORMIN (GLUCOPHAGE) 500 MG tablet Take 2 tablets (1,000 mg total) by mouth 2 (two) times daily with a meal. 07/04/20  Yes Biagio Borg, MD  potassium chloride SA (KLOR-CON M20) 20 MEQ tablet Take 1 tablet (20 mEq total) by mouth daily. 07/04/20  Yes Biagio Borg, MD  telmisartan (MICARDIS) 80 MG tablet Take 1 tablet (80 mg total) by mouth daily. 07/04/20  Yes Biagio Borg, MD  vitamin B-12 (CYANOCOBALAMIN) 1000 MCG tablet Take 1 tablet (1,000 mcg total) by mouth daily. 06/28/20  Yes Biagio Borg, MD    Inpatient Medications: Scheduled Meds:  amLODipine  10 mg Oral Daily   atorvastatin  10 mg Oral Daily   cloNIDine  0.3 mg Oral TID   docusate sodium  100 mg Oral BID   enoxaparin (LOVENOX) injection  85 mg Subcutaneous Q24H   insulin aspart  0-15 Units Subcutaneous TID WC   insulin aspart  0-5 Units Subcutaneous QHS   metoprolol succinate  12.5 mg Oral Daily   sodium chloride flush  3 mL Intravenous Q12H   Continuous Infusions:   ceFAZolin (ANCEF) IV 2 g (07/30/20 1402)   PRN Meds: acetaminophen **OR** acetaminophen, hydrALAZINE, ondansetron **OR** ondansetron (ZOFRAN) IV, oxyCODONE  Allergies:    Allergies  Allergen Reactions   Penicillins Other (See Comments)    Unknown childhood allergic reaction Has patient had a PCN reaction causing immediate rash, facial/tongue/throat swelling, SOB or lightheadedness with hypotension: Unknown Has patient had a PCN reaction causing severe rash involving mucus membranes or skin necrosis: Unknown Has patient had a PCN reaction that required hospitalization: No Has patient had a PCN  reaction occurring within the last 10 years: No If all of the above answers are "NO", then may proceed with Cephalosporin use.    Tizanidine Other (See Comments)    07/26/20: Pt does not recognize drug and does not remember being allergic to it.    Social History:   Social History   Socioeconomic History   Marital status: Married    Spouse name: Not on file   Number of children: Not on file   Years of education: Not on file  Highest education level: Not on file  Occupational History   Occupation: retired  Tobacco Use   Smoking status: Never Smoker   Smokeless tobacco: Never Used  Scientific laboratory technician Use: Never used  Substance and Sexual Activity   Alcohol use: Not Currently    Alcohol/week: 3.0 - 4.0 standard drinks    Types: 3 - 4 Cans of beer per week    Comment: hardly any; h/o heavy use   Drug use: No   Sexual activity: Not on file  Other Topics Concern   Not on file  Social History Narrative   Not on file   Social Determinants of Health   Financial Resource Strain:    Difficulty of Paying Living Expenses: Not on file  Food Insecurity:    Worried About [place] in the Last Year: Not on file   [employer] of Food in the Last Year: Not on file  Transportation Needs:    Lack of Transportation (Medical): Not on file   Lack of Transportation (Non-Medical): Not on file  Physical Activity:    Days of Exercise per Week: Not on file   Minutes of Exercise per Session: Not on file  Stress:    Feeling of Stress : Not on file  Social Connections:    Frequency of Communication with Friends and Family: Not on file   Frequency of Social Gatherings with Friends and Family: Not on file   Attends Religious Services: Not on file   Active Member of Clubs or Organizations: Not on file   Attends Archivist Meetings: Not on file   Marital Status: Not on file  Intimate Partner Violence:    Fear of Current or Ex-Partner: Not on file    Emotionally Abused: Not on file   Physically Abused: Not on file   Sexually Abused: Not on file    Family History:   Family History  Problem Relation Age of Onset   Cardiomyopathy Father    Asthma Other    Family Status:  Family Status  Relation Name Status   Father  Deceased   Other  Deceased   Mother  Deceased    ROS:  Please see the history of present illness.  All other ROS reviewed and negative.     Physical Exam/Data:   Vitals:   07/30/20 0016 07/30/20 0453 07/30/20 0900 07/30/20 1345  BP: (!) 158/86 (!) 161/85 (!) 157/80 (!) 143/85  Pulse: (!) 118 (!) 117 (!) 121 (!) 122  Resp: 20 20 20 20   Temp: 99.1 F (37.3 C) 99.3 F (37.4 C) 99.2 F (37.3 C) 100 F (37.8 C)  TempSrc: Oral Oral Oral Oral  SpO2: 97% 97% 98% 98%  Weight:  (!) 180 kg    Height:        Intake/Output Summary (Last 24 hours) at 07/30/2020 1526 Last data filed at 07/30/2020 0820 Gross per 24 hour  Intake 1390 ml  Output 1300 ml  Net 90 ml    Last 3 Weights 07/30/2020 07/28/2020 07/26/2020  Weight (lbs) 396 lb 13.3 oz 385 lb 2.3 oz 385 lb  Weight (kg) 180 kg 174.7 kg 174.635 kg     Body mass index is 53.82 kg/m.   General:  Well nourished, well developed, male in no acute distress HEENT: normal Lymph: no adenopathy Neck: JVD -not able to assess secondary to body habitus Endocrine:  No thryomegaly Vascular: No carotid bruits; 4/4 extremity pulses 2+  Cardiac:  normal S1,  S2; irregular rate and rhythm; no murmur Lungs:  clear bilaterally, no wheezing, rhonchi or rales  Abd: soft, nontender, no hepatomegaly  Ext: Trace right lower extremity edema, left lower extremity very tender and edematous Musculoskeletal:  No deformities, BUE and BLE strength normal and equal Skin: warm and dry, left great toe wound without drainage, significant erythema on left lower extremity Neuro:  CNs 2-12 intact, no focal abnormalities noted Psych:  Normal affect   EKG:  The EKG was  personally reviewed and demonstrates:  07/26/2020 ECG is atrial flutter, HR 125, PVCs 07/29/2020 ECG is atrial flutter vs ST, HR 122, PVCs Telemetry:  Telemetry was personally reviewed and demonstrates: Patient was in atrial flutter on admission, rate has been variable.  When the rate slows, flutter waves are easy to see   CV studies:   ECHO: 07/29/2020 1. Left ventricular ejection fraction, by estimation, is 55 to 60%. The  left ventricle has normal function. The left ventricle has no regional  wall motion abnormalities. There is mild left ventricular hypertrophy.  Indeterminate diastolic filling due to E-A fusion.  2. Right ventricular systolic function was not well visualized. The right  ventricular size is not well visualized.  3. Left atrial size was mildly dilated.  4. Right atrial size was mildly dilated.  5. The mitral valve is grossly normal. Trivial mitral valve  regurgitation.  6. The aortic valve is tricuspid. Aortic valve regurgitation is not  visualized.  7. Aortic dilatation noted. There is mild dilatation of the aortic root,  measuring 41 mm. There is mild dilatation of the ascending aorta,  measuring 41 mm.    Laboratory Data:   Chemistry Recent Labs  Lab 07/28/20 0454 07/29/20 0310 07/30/20 0146  NA 136 137 137  K 4.0 3.9 4.5  CL 105 103 104  CO2 19* 18* 20*  GLUCOSE 170* 194* 191*  BUN 39* 35* 33*  CREATININE 2.17* 1.94* 1.84*  CALCIUM 8.3* 8.3* 8.2*  GFRNONAA 33* 38* 40*  ANIONGAP 12 16* 13    Lab Results  Component Value Date   ALT 22 07/26/2020   AST 21 07/26/2020   ALKPHOS 87 07/26/2020   BILITOT 1.3 (H) 07/26/2020   Hematology Recent Labs  Lab 07/27/20 0445 07/28/20 0454 07/29/20 0310  WBC 15.2* 19.3* 17.2*  RBC 3.16* 3.15* 3.23*  HGB 9.4* 9.4* 9.7*  HCT 28.9* 29.0* 29.5*  MCV 91.5 92.1 91.3  MCH 29.7 29.8 30.0  MCHC 32.5 32.4 32.9  RDW 14.2 14.6 14.7  PLT 205 239 260   Cardiac Enzymes High Sensitivity Troponin:    Recent Labs  Lab 07/26/20 1329  TROPONINIHS 28*      BNP Recent Labs  Lab 07/26/20 1346 07/29/20 0310  BNP 296.5* 356.8*    TSH:  Lab Results  Component Value Date   TSH 2.23 12/18/2019   Lipids: Lab Results  Component Value Date   CHOL 170 06/27/2020   HDL 43.90 06/27/2020   LDLCALC 93 06/27/2020   LDLDIRECT 83.0 12/19/2018   TRIG 166.0 (H) 06/27/2020   CHOLHDL 4 06/27/2020   HgbA1c: Lab Results  Component Value Date   HGBA1C 7.0 (H) 06/27/2020   Magnesium:  Magnesium  Date Value Ref Range Status  07/26/2020 1.7 1.7 - 2.4 mg/dL Final    Comment:    Performed at [facility], [place] [address], [place], [place] [address]     Radiology/Studies:  CT TIBIA FIBULA LEFT WO CONTRAST  Result Date: 07/30/2020 CLINICAL DATA:  Left lower extremity pain and swelling.  Sepsis. EXAM: CT OF THE LOWER LEFT EXTREMITY WITHOUT CONTRAST TECHNIQUE: Multidetector CT imaging of the lower left extremity was performed according to the standard protocol. COMPARISON:  None. FINDINGS: Severe and diffuse skin thickening, subcutaneous soft tissue swelling/edema/fluid and what appear to be wounds along the lateral aspect of the lower extremity. I do not see a discrete fluid collection but exam limited without contrast to assess for abscess. No findings suspicious for myofasciitis or pyomyositis. Moderate knee and ankle joint degenerative changes but no destructive bony changes to suggest septic arthritis. There is a moderate-sized knee joint effusion noted. No destructive bony changes to suggest osteomyelitis. There is a bony exostosis projecting off the posteromedial aspect of the tibia which is likely due to a fascial attachment site with calcification. Age advanced vascular calcifications. IMPRESSION: 1. Severe and diffuse skin thickening, subcutaneous soft tissue swelling/edema/fluid and what appear to be wounds along the lateral aspect of the lower extremity. No discrete fluid collection  but exam limited without contrast to assess for abscess. 2. No findings suspicious for myofasciitis or pyomyositis. 3. No CT findings suspicious for septic arthritis or osteomyelitis. 4. Moderate-sized knee joint effusion. 5. Age advanced vascular calcifications. Electronically Signed   By: Marijo Sanes M.D.   On: 07/30/2020 07:49   CT FOOT LEFT WO CONTRAST  Result Date: 07/30/2020 CLINICAL DATA:  Left foot pain and swelling. EXAM: CT OF THE LEFT FOOT WITHOUT CONTRAST TECHNIQUE: Multidetector CT imaging of the left foot was performed according to the standard protocol. Multiplanar CT image reconstructions were also generated. COMPARISON:  None. FINDINGS: Diffuse and marked subcutaneous soft tissue swelling/edema/fluid most notable along the dorsum of the foot. No discrete fluid collection to suggest a drainable abscess but exam limited without contrast. No findings suspicious for myofasciitis or pyomyositis. Degenerative changes involving the ankle and foot but no CT findings to suggest septic arthritis or osteomyelitis. Age advanced vascular calcifications. IMPRESSION: 1. Diffuse and marked subcutaneous soft tissue swelling/edema/fluid most notable along the dorsum of the foot. No discrete fluid collection to suggest a drainable abscess but exam limited without contrast. 2. No findings suspicious for myofasciitis or pyomyositis. 3. Degenerative changes involving the ankle and foot but no CT findings to suggest septic arthritis or osteomyelitis. 4. Age advanced vascular calcifications. Electronically Signed   By: Marijo Sanes M.D.   On: 07/30/2020 07:52   MR TOES LEFT W WO CONTRAST  Result Date: 07/27/2020 CLINICAL DATA:  Diabetic with massive foot swelling. EXAM: MRI OF THE LEFT TOES WITHOUT AND WITH CONTRAST TECHNIQUE: Multiplanar, multisequence MR imaging of the left foot was performed both before and after administration of intravenous contrast. CONTRAST:  52mL GADAVIST GADOBUTROL 1 MMOL/ML IV SOLN  COMPARISON:  None. FINDINGS: Extensive subcutaneous soft tissue swelling/edema/fluid most notably along the dorsum of the foot. No discrete rim enhancing fluid collection to suggest a drainable soft tissue abscess. No obvious open wound or gas in the soft tissues. The bony structures are intact. I do not see any findings suspicious for osteomyelitis or septic arthritis. Diffuse fatty change involving the forefoot musculature. Mild diffuse myositis but no findings for pyomyositis. IMPRESSION: 1. Extensive subcutaneous soft tissue swelling/edema/fluid suggesting severe cellulitis. No discrete rim enhancing fluid collection to suggest a drainable soft tissue abscess. 2. No findings for osteomyelitis or septic arthritis. Electronically Signed   By: Marijo Sanes M.D.   On: 07/27/2020 07:38   DG CHEST PORT 1 VIEW  Result Date: 07/29/2020 CLINICAL DATA:  64 year old male with fever. EXAM: PORTABLE CHEST 1 VIEW COMPARISON:  Chest radiograph dated 07/26/2020. FINDINGS: No focal consolidation, pleural effusion or pneumothorax. The left costophrenic angle has been excluded from the image and not evaluated. There is stable cardiomegaly. Mild bilateral hilar prominence suggestive of pulmonary hypertension. No acute osseous pathology. IMPRESSION: No acute cardiopulmonary process.  No interval change. Electronically Signed   By: Anner Crete M.D.   On: 07/29/2020 20:18   ECHOCARDIOGRAM COMPLETE  Result Date: 07/29/2020    ECHOCARDIOGRAM REPORT   Patient Name:   ZAKK BORGEN Date of Exam: 07/29/2020 Medical Rec #:  [id number]        Height:       72.0 in Accession #:    [id number]       Weight:       385.1 lb Date of Birth:  [birth year]        BSA:          2.815 m Patient Age:    79 years         BP:           144/93 mmHg Patient Gender: M                HR:           120 bpm. Exam Location:  Inpatient Procedure: 2D Echo and Intracardiac Opacification Agent Indications:    Abnormal ECG  History:        Patient has  prior history of Echocardiogram examinations. CHF;                 Risk Factors:Hypertension, Dyslipidemia and Diabetes.  Sonographer:    Mikki Santee RDCS (AE) Referring Phys: [id number] [place]  1. Left ventricular ejection fraction, by estimation, is 55 to 60%. The left ventricle has normal function. The left ventricle has no regional wall motion abnormalities. There is mild left ventricular hypertrophy. Indeterminate diastolic filling due to E-A fusion.  2. Right ventricular systolic function was not well visualized. The right ventricular size is not well visualized.  3. Left atrial size was mildly dilated.  4. Right atrial size was mildly dilated.  5. The mitral valve is grossly normal. Trivial mitral valve regurgitation.  6. The aortic valve is tricuspid. Aortic valve regurgitation is not visualized.  7. Aortic dilatation noted. There is mild dilatation of the aortic root, measuring 41 mm. There is mild dilatation of the ascending aorta, measuring 41 mm. Comparison(s): No prior Echocardiogram. FINDINGS  Left Ventricle: Left ventricular ejection fraction, by estimation, is 55 to 60%. The left ventricle has normal function. The left ventricle has no regional wall motion abnormalities. Definity contrast agent was given IV to delineate the left ventricular  endocardial borders. The left ventricular internal cavity size was normal in size. There is mild left ventricular hypertrophy. Indeterminate diastolic filling due to E-A fusion. Right Ventricle: The right ventricular size is not well visualized. Right vetricular wall thickness was not well visualized. Right ventricular systolic function was not well visualized. Left Atrium: Left atrial size was mildly dilated. Right Atrium: Right atrial size was mildly dilated. Pericardium: There is no evidence of pericardial effusion. Mitral Valve: The mitral valve is grossly normal. Trivial mitral valve regurgitation. Tricuspid Valve: The tricuspid valve is  grossly normal. Tricuspid valve regurgitation is trivial. Aortic Valve: The aortic valve is tricuspid. Aortic valve regurgitation is not visualized. Pulmonic Valve: The pulmonic valve was normal in structure. Pulmonic valve regurgitation is not visualized. Aorta: Aortic dilatation  noted. There is mild dilatation of the aortic root, measuring 41 mm. There is mild dilatation of the ascending aorta, measuring 41 mm. IAS/Shunts: No atrial level shunt detected by color flow Doppler.  LEFT VENTRICLE PLAX 2D LVIDd:         6.60 cm LVIDs:         4.90 cm LV PW:         1.20 cm LV IVS:        1.10 cm LVOT diam:     2.60 cm LV SV:         95 LV SV Index:   34 LVOT Area:     5.31 cm  RIGHT VENTRICLE TAPSE (M-mode): 1.3 cm LEFT ATRIUM              Index       RIGHT ATRIUM           Index LA diam:        4.90 cm  1.74 cm/m  RA Area:     29.20 cm LA Vol (A2C):   76.5 ml  27.18 ml/m RA Volume:   96.20 ml  34.18 ml/m LA Vol (A4C):   102.0 ml 36.24 ml/m LA Biplane Vol: 92.1 ml  32.72 ml/m  AORTIC VALVE LVOT Vmax:   94.20 cm/s LVOT Vmean:  69.100 cm/s LVOT VTI:    0.178 m  AORTA Ao Root diam: 4.10 cm  SHUNTS Systemic VTI:  0.18 m Systemic Diam: 2.60 cm Lyman Bishop MD Electronically signed by Lyman Bishop MD Signature Date/Time: 07/29/2020/12:52:55 PM    Final    VAS [place] LOWER EXTREMITY VENOUS (DVT) (ONLY [facility] & [facility])  Result Date: 07/26/2020  Lower Venous DVT Study Indications: Edema, and Erythema. Other Indications: 174.6kg. Limitations: Body habitus, depth of vessels, tissue properties, clothing restriction, patient unable to tolerate compression maneuvers. Comparison Study: 10/29/2013- negative lower extremity venous duplex Performing Technologist: Maudry Mayhew RDMS, RVT, RDCS  Examination Guidelines: A complete evaluation includes B-mode imaging, spectral Doppler, color Doppler, and power Doppler as needed of all accessible portions of each vessel. Bilateral testing is considered an integral part of a complete  examination. Limited examinations for reoccurring indications may be performed as noted. The reflux portion of the exam is performed with the patient in reverse Trendelenburg.  +-----+---------------+---------+-----------+-------------------+--------------+  RIGHT Compressibility Phasicity Spontaneity Properties          Thrombus Aging  +-----+---------------+---------+-----------+-------------------+--------------+  CFV                                         unable to visualize                 +-----+---------------+---------+-----------+-------------------+--------------+   +---------+---------------+---------+-----------+---------------+-------------+  LEFT      Compressibility Phasicity Spontaneity Properties      Thrombus                                                                        Aging          +---------+---------------+---------+-----------+---------------+-------------+  CFV  unable to                                                                       visualize                      +---------+---------------+---------+-----------+---------------+-------------+  SFJ                                             unable to                                                                       visualize                      +---------+---------------+---------+-----------+---------------+-------------+  FV Prox                                         unable to                                                                       visualize                      +---------+---------------+---------+-----------+---------------+-------------+  FV Mid                              Yes         patent                         +---------+---------------+---------+-----------+---------------+-------------+  FV Distal                           Yes         patent                         +---------+---------------+---------+-----------+---------------+-------------+  PFV                                              unable to  visualize                      +---------+---------------+---------+-----------+---------------+-------------+  POP                       Yes       Yes         patent                         +---------+---------------+---------+-----------+---------------+-------------+  PTV                                 Yes         patent                         +---------+---------------+---------+-----------+---------------+-------------+  PERO                                Yes         patent                         +---------+---------------+---------+-----------+---------------+-------------+     Summary: LEFT: - There is no evidence of deep vein thrombosis in the lower extremity. However, portions of this examination were limited- see technologist comments above.  - No cystic structure found in the popliteal fossa.  *See table(s) above for measurements and observations.    Preliminary     Assessment and Plan:   1.  Atrial flutter, RVR at times -Duration is unknown. -CHA2DS2-VASc is 3 (age x 1, DM, HTN). - he would be a candidate for anticoagulation -He is currently on DVT Lovenox, dose adjusted for weight -Beta-blocker is new, will increase the dose for better heart rate control -He will need TEE cardioversion and uninterrupted anticoagulation after that to get him back in sinus rhythm. -Defer planning this until his cellulitis is more improved  2.  Hypertension: -Prior to admission, he was taking Lasix 80 mg daily as needed and Micardis 80 mg daily, both on hold because of worsening renal function. -He is on his home medication -  amlodipine 10 mg daily -Also on clonidine 0.3 mg, but dose increased from home dose of twice daily (morning and noon) up to guideline dosing of 3 times daily -Toprol-XL 12.5 mg daily was started yesterday, he has plenty of heart rate and blood pressure to  increase this, we will go to 25 mg twice daily  3.  Sepsis due to left lower extremity cellulitis from left great toe wound -ID has seen the, antibiotics adjusted -T-max last 24 hours was 101.8 yesterday afternoon, no significant fevers today -Per IM/ID  Otherwise, per IM Principal Problem:   Sepsis due to cellulitis [facility]) Active Problems:   Diabetes mellitus type 2 in obese ([place])   Hyperlipidemia   Morbid obesity (HCC)   OSA (obstructive sleep apnea)   Essential hypertension   AKI (acute kidney injury) ([place])   Stage 3a chronic kidney disease ([place])     For questions or updates, please contact [facility] Please consult [email or web address] for contact info under Cardiology/STEMI.   Signed, Rosaria Ferries, PA-C  07/30/2020 3:26 PM   History and all data above reviewed.   The patient has had no clear past cardiac history.  He went to  urgent care because he had a red swollen foot.  He was noted to have a rapid heart rate and was in atrial flutter.  The patient denies any new symptoms such as chest discomfort, neck or arm discomfort. There has been no new shortness of breath, PND or orthopnea. There have been no reported palpitations, presyncope or syncope.  He lives alone since his wife died last year.  He does his own chores. Patient examined.  I agree with the findings as above.  The patient exam reveals [id number]   ,  Lungs:  Clear,  Abd: Morbidly obese, Positive bowel sounds, no rebound no guarding, Ext   Bilateral moderate to severe leg swelling with left leg rubor and dolor  .  All available labs, radiology testing, previous records reviewed. Agree with documented assessment and plan.   Atrial flutter:  I would suggest DOAC if OK with the primary team.  We will work on rate control.  He might need DCCV if we cannot control the rate before discharge.  In that case he would need TEE as we don't know how long he has been in this rhythm.  For now agree with increased beta blocker.     Jeneen Rinks Lark Langenfeld  5:20 PM  07/30/2020

## 2020-07-30 NOTE — Care Management Important Message (Signed)
Important Message  Patient Details  Name: SOSTENES KAUFFMANN MRN: [id number] Date of Birth: [birth year]   Medicare Important Message Given:  Yes     Janai Maudlin P [place] 07/30/2020, 10:28 AM

## 2020-07-30 NOTE — Progress Notes (Signed)
PROGRESS NOTE    Gerald Day  ONG:[id number] DOB: [birth year] DOA: 07/26/2020 PCP: Biagio Borg, MD    Brief Narrative:  64 year old with past medical history significant of OSA on CPAP, morbid obesity, hypertension, HLD, diabetes, CHF, history of right lower extremity cellulitis presenting with lower extremity cellulitis.  Patient also presented with shortness of breath since 3 days, lower extremity edema.Found with cellulitis. Has been tachycardic. On 11/22 febrile again, tachy. ID was consulted.  On tele/ekg ?aflutter/afib...>cards was consulted.  11/23-Tmax 101.2, Ct leg completed. ID stopped vanco and cefepime, narrowed to cefazolin.  Consultants:   ID  Procedures:  CT foot/tibia/fibula 1. Severe and diffuse skin thickening, subcutaneous soft tissue swelling/edema/fluid and what appear to be wounds along the lateral aspect of the lower extremity. No discrete fluid collection but exam limited without contrast to assess for abscess. 2. No findings suspicious for myofasciitis or pyomyositis. 3. No CT findings suspicious for septic arthritis or osteomyelitis. 4. Moderate-sized knee joint effusion. 5. Age advanced vascular calcifications.   Antimicrobials:   Vancomycin, cefepime--->>dc 11/23  Metronidazole d/c'd Cefazolin start 11/23...>  Subjective: Reports cant move LLE due to edema. No new complaints. No cp or sob at rest .  Has not been keeping his legs elevated   Objective: Vitals:   07/29/20 2030 07/30/20 0016 07/30/20 0453 07/30/20 0900  BP: 133/72 (!) 158/86 (!) 161/85 (!) 157/80  Pulse: (!) 118 (!) 118 (!) 117 (!) 121  Resp: 18 20 20 20   Temp: (!) 101.2 F (38.4 C) 99.1 F (37.3 C) 99.3 F (37.4 C) 99.2 F (37.3 C)  TempSrc: Oral Oral Oral Oral  SpO2: 94% 97% 97% 98%  Weight:   (!) 180 kg   Height:        Intake/Output Summary (Last 24 hours) at 07/30/2020 1318 Last data filed at 07/30/2020 0820 Gross per 24 hour  Intake 1745 ml  Output  1300 ml  Net 445 ml   Filed Weights   07/28/20 2018 07/30/20 0453  Weight: (!) 174.7 kg (!) 180 kg    Examination: Calm, sitting up in bed, NAD cta no wheezing Reg-irreg, s1/s2, no gallops Soft nt/nd +bs LLE with intense erythema up to mid poster/med. Knee, >3+ edema. RLE +edema  Aaxox3, grossly intact       Data Reviewed: I have personally reviewed following labs and imaging studies  CBC: Recent Labs  Lab 07/26/20 1329 07/27/20 0445 07/28/20 0454 07/29/20 0310  WBC 18.4* 15.2* 19.3* 17.2*  HGB 11.7* 9.4* 9.4* 9.7*  HCT 35.9* 28.9* 29.0* 29.5*  MCV 93.2 91.5 92.1 91.3  PLT 304 205 239 440   Basic Metabolic Panel: Recent Labs  Lab 07/26/20 1329 07/26/20 1345 07/27/20 0445 07/28/20 0454 07/29/20 0310 07/30/20 0146  NA 135  --  134* 136 137 137  K 4.0  --  4.2 4.0 3.9 4.5  CL 101  --  103 105 103 104  CO2 21*  --  21* 19* 18* 20*  GLUCOSE 179*  --  191* 170* 194* 191*  BUN 35*  --  37* 39* 35* 33*  CREATININE 2.60*  --  2.41* 2.17* 1.94* 1.84*  CALCIUM 8.8*  --  8.2* 8.3* 8.3* 8.2*  MG  --  1.7  --   --   --   --    GFR: Estimated Creatinine Clearance: 67.1 mL/min (A) (by C-G formula based on SCr of 1.84 mg/dL (H)). Liver Function Tests: Recent Labs  Lab 07/26/20 1450  AST 21  ALT 22  ALKPHOS 87  BILITOT 1.3*  PROT 6.9  ALBUMIN 2.7*   No results for input(s): LIPASE, AMYLASE in the last 168 hours. No results for input(s): AMMONIA in the last 168 hours. Coagulation Profile: Recent Labs  Lab 07/26/20 1345  INR 1.4*   Cardiac Enzymes: No results for input(s): CKTOTAL, CKMB, CKMBINDEX, TROPONINI in the last 168 hours. BNP (last 3 results) No results for input(s): PROBNP in the last 8760 hours. HbA1C: No results for input(s): HGBA1C in the last 72 hours. CBG: Recent Labs  Lab 07/29/20 1631 07/29/20 2031 07/30/20 0456 07/30/20 0655 07/30/20 1148  GLUCAP 181* 172* 168* 178* 193*   Lipid Profile: No results for input(s): CHOL, HDL,  LDLCALC, TRIG, CHOLHDL, LDLDIRECT in the last 72 hours. Thyroid Function Tests: No results for input(s): TSH, T4TOTAL, FREET4, T3FREE, THYROIDAB in the last 72 hours. Anemia Panel: No results for input(s): VITAMINB12, FOLATE, FERRITIN, TIBC, IRON, RETICCTPCT in the last 72 hours. Sepsis Labs: Recent Labs  Lab 07/26/20 1450 07/26/20 1736 07/26/20 1746 07/26/20 2228  PROCALCITON  --  1.64  --   --   LATICACIDVEN 2.1*  --  1.5 1.3    Recent Results (from the past 240 hour(s))  Blood Culture (routine x 2)     Status: None (Preliminary result)   Collection Time: 07/26/20  2:50 PM   Specimen: BLOOD  Result Value Ref Range Status   Specimen Description BLOOD RIGHT ANTECUBITAL  Final   Special Requests   Final    BLOOD Blood Culture results may not be optimal due to an inadequate volume of blood received in culture bottles   Culture   Final    NO GROWTH 4 DAYS Performed at [place] [address] [place], [place] [address]    Report Status PENDING  Incomplete  Blood Culture (routine x 2)     Status: None (Preliminary result)   Collection Time: 07/26/20  3:46 PM   Specimen: BLOOD  Result Value Ref Range Status   Specimen Description BLOOD SITE NOT SPECIFIED  Final   Special Requests   Final    BOTTLES DRAWN AEROBIC AND ANAEROBIC Blood Culture adequate volume   Culture   Final    NO GROWTH 4 DAYS Performed at [facility], [address], [place], [place] [address]    Report Status PENDING  Incomplete  Respiratory Panel by RT PCR (Flu A&B, Covid) - Nasopharyngeal Swab     Status: None   Collection Time: 07/26/20  5:54 PM   Specimen: Nasopharyngeal Swab; Nasopharyngeal(NP) swabs in vial transport medium  Result Value Ref Range Status   SARS Coronavirus 2 by RT PCR NEGATIVE NEGATIVE Final    Comment: (NOTE) SARS-CoV-2 target nucleic acids are NOT DETECTED.  The SARS-CoV-2 RNA is generally detectable in upper respiratoy specimens during the acute phase of  infection. The lowest concentration of SARS-CoV-2 viral copies this assay can detect is 131 copies/mL. A negative result does not preclude SARS-Cov-2 infection and should not be used as the sole basis for treatment or other patient management decisions. A negative result may occur with  improper specimen collection/handling, submission of specimen other than nasopharyngeal swab, presence of viral mutation(s) within the areas targeted by this assay, and inadequate number of viral copies (<131 copies/mL). A negative result must be combined with clinical observations, patient history, and epidemiological information. The expected result is Negative.  Fact Sheet for Patients:  PinkCheek.be  Fact Sheet for Healthcare Providers:  GravelBags.it  This test is no t yet approved or cleared by the [place] and  has been authorized for detection and/or diagnosis of SARS-CoV-2 by FDA under an Emergency Use Authorization (EUA). This EUA will remain  in effect (meaning this test can be used) for the duration of the COVID-19 declaration under Section 564(b)(1) of the Act, 21 U.S.C. section 360bbb-3(b)(1), unless the authorization is terminated or revoked sooner.     Influenza A by PCR NEGATIVE NEGATIVE Final   Influenza B by PCR NEGATIVE NEGATIVE Final    Comment: (NOTE) The Xpert Xpress SARS-CoV-2/FLU/RSV assay is intended as an aid in  the diagnosis of influenza from Nasopharyngeal swab specimens and  should not be used as a sole basis for treatment. Nasal washings and  aspirates are unacceptable for Xpert Xpress SARS-CoV-2/FLU/RSV  testing.  Fact Sheet for Patients: PinkCheek.be  Fact Sheet for Healthcare Providers: GravelBags.it  This test is not yet approved or cleared by the [place] FDA and  has been authorized for detection and/or diagnosis of SARS-CoV-2  by  FDA under an Emergency Use Authorization (EUA). This EUA will remain  in effect (meaning this test can be used) for the duration of the  Covid-19 declaration under Section 564(b)(1) of the Act, 21  U.S.C. section 360bbb-3(b)(1), unless the authorization is  terminated or revoked. Performed at [facility], [place] [address] [place], [place] [address]   Urine Culture     Status: None   Collection Time: 07/27/20  1:45 PM   Specimen: Urine, Random  Result Value Ref Range Status   Specimen Description URINE, RANDOM  Final   Special Requests NONE  Final   Culture   Final    NO GROWTH Performed at [facility], [place] [address], [place], [place] [address]    Report Status 07/28/2020 FINAL  Final  Culture, blood (routine x 2)     Status: None (Preliminary result)   Collection Time: 07/29/20  5:52 PM   Specimen: BLOOD  Result Value Ref Range Status   Specimen Description BLOOD LEFT ANTECUBITAL  Final   Special Requests   Final    BOTTLES DRAWN AEROBIC AND ANAEROBIC Blood Culture results may not be optimal due to an excessive volume of blood received in culture bottles   Culture   Final    NO GROWTH < 12 HOURS Performed at [facility], [place] [address] [place], [place] [address]    Report Status PENDING  Incomplete  Culture, blood (routine x 2)     Status: None (Preliminary result)   Collection Time: 07/29/20  5:57 PM   Specimen: BLOOD  Result Value Ref Range Status   Specimen Description BLOOD RIGHT ANTECUBITAL  Final   Special Requests   Final    BOTTLES DRAWN AEROBIC AND ANAEROBIC Blood Culture results may not be optimal due to an excessive volume of blood received in culture bottles   Culture   Final    NO GROWTH < 12 HOURS Performed at [facility], [place] [address] [place], [place] [address]    Report Status PENDING  Incomplete         Radiology Studies: CT TIBIA FIBULA LEFT WO CONTRAST  Result Date: 07/30/2020 CLINICAL DATA:  Left lower  extremity pain and swelling.  Sepsis. EXAM: CT OF THE LOWER LEFT EXTREMITY WITHOUT CONTRAST TECHNIQUE: Multidetector CT imaging of the lower left extremity was performed according to the standard protocol. COMPARISON:  None. FINDINGS:  Severe and diffuse skin thickening, subcutaneous soft tissue swelling/edema/fluid and what appear to be wounds along the lateral aspect of the lower extremity. I do not see a discrete fluid collection but exam limited without contrast to assess for abscess. No findings suspicious for myofasciitis or pyomyositis. Moderate knee and ankle joint degenerative changes but no destructive bony changes to suggest septic arthritis. There is a moderate-sized knee joint effusion noted. No destructive bony changes to suggest osteomyelitis. There is a bony exostosis projecting off the posteromedial aspect of the tibia which is likely due to a fascial attachment site with calcification. Age advanced vascular calcifications. IMPRESSION: 1. Severe and diffuse skin thickening, subcutaneous soft tissue swelling/edema/fluid and what appear to be wounds along the lateral aspect of the lower extremity. No discrete fluid collection but exam limited without contrast to assess for abscess. 2. No findings suspicious for myofasciitis or pyomyositis. 3. No CT findings suspicious for septic arthritis or osteomyelitis. 4. Moderate-sized knee joint effusion. 5. Age advanced vascular calcifications. Electronically Signed   By: Marijo Sanes M.D.   On: 07/30/2020 07:49   CT FOOT LEFT WO CONTRAST  Result Date: 07/30/2020 CLINICAL DATA:  Left foot pain and swelling. EXAM: CT OF THE LEFT FOOT WITHOUT CONTRAST TECHNIQUE: Multidetector CT imaging of the left foot was performed according to the standard protocol. Multiplanar CT image reconstructions were also generated. COMPARISON:  None. FINDINGS: Diffuse and marked subcutaneous soft tissue swelling/edema/fluid most notable along the dorsum of the foot. No discrete  fluid collection to suggest a drainable abscess but exam limited without contrast. No findings suspicious for myofasciitis or pyomyositis. Degenerative changes involving the ankle and foot but no CT findings to suggest septic arthritis or osteomyelitis. Age advanced vascular calcifications. IMPRESSION: 1. Diffuse and marked subcutaneous soft tissue swelling/edema/fluid most notable along the dorsum of the foot. No discrete fluid collection to suggest a drainable abscess but exam limited without contrast. 2. No findings suspicious for myofasciitis or pyomyositis. 3. Degenerative changes involving the ankle and foot but no CT findings to suggest septic arthritis or osteomyelitis. 4. Age advanced vascular calcifications. Electronically Signed   By: Marijo Sanes M.D.   On: 07/30/2020 07:52   DG CHEST PORT 1 VIEW  Result Date: 07/29/2020 CLINICAL DATA:  64 year old male with fever. EXAM: PORTABLE CHEST 1 VIEW COMPARISON:  Chest radiograph dated 07/26/2020. FINDINGS: No focal consolidation, pleural effusion or pneumothorax. The left costophrenic angle has been excluded from the image and not evaluated. There is stable cardiomegaly. Mild bilateral hilar prominence suggestive of pulmonary hypertension. No acute osseous pathology. IMPRESSION: No acute cardiopulmonary process.  No interval change. Electronically Signed   By: Anner Crete M.D.   On: 07/29/2020 20:18   ECHOCARDIOGRAM COMPLETE  Result Date: 07/29/2020    ECHOCARDIOGRAM REPORT   Patient Name:   JARROD MCENERY Date of Exam: 07/29/2020 Medical Rec #:  [id number]        Height:       72.0 in Accession #:    [id number]       Weight:       385.1 lb Date of Birth:  02/10/1956        BSA:          2.815 m Patient Age:    87 years         BP:           144/93 mmHg Patient Gender: M  HR:           120 bpm. Exam Location:  Inpatient Procedure: 2D Echo and Intracardiac Opacification Agent Indications:    Abnormal ECG  History:        Patient  has prior history of Echocardiogram examinations. CHF;                 Risk Factors:Hypertension, Dyslipidemia and Diabetes.  Sonographer:    Mikki Santee RDCS (AE) Referring Phys: [id number] [place]  1. Left ventricular ejection fraction, by estimation, is 55 to 60%. The left ventricle has normal function. The left ventricle has no regional wall motion abnormalities. There is mild left ventricular hypertrophy. Indeterminate diastolic filling due to E-A fusion.  2. Right ventricular systolic function was not well visualized. The right ventricular size is not well visualized.  3. Left atrial size was mildly dilated.  4. Right atrial size was mildly dilated.  5. The mitral valve is grossly normal. Trivial mitral valve regurgitation.  6. The aortic valve is tricuspid. Aortic valve regurgitation is not visualized.  7. Aortic dilatation noted. There is mild dilatation of the aortic root, measuring 41 mm. There is mild dilatation of the ascending aorta, measuring 41 mm. Comparison(s): No prior Echocardiogram. FINDINGS  Left Ventricle: Left ventricular ejection fraction, by estimation, is 55 to 60%. The left ventricle has normal function. The left ventricle has no regional wall motion abnormalities. Definity contrast agent was given IV to delineate the left ventricular  endocardial borders. The left ventricular internal cavity size was normal in size. There is mild left ventricular hypertrophy. Indeterminate diastolic filling due to E-A fusion. Right Ventricle: The right ventricular size is not well visualized. Right vetricular wall thickness was not well visualized. Right ventricular systolic function was not well visualized. Left Atrium: Left atrial size was mildly dilated. Right Atrium: Right atrial size was mildly dilated. Pericardium: There is no evidence of pericardial effusion. Mitral Valve: The mitral valve is grossly normal. Trivial mitral valve regurgitation. Tricuspid Valve: The tricuspid valve  is grossly normal. Tricuspid valve regurgitation is trivial. Aortic Valve: The aortic valve is tricuspid. Aortic valve regurgitation is not visualized. Pulmonic Valve: The pulmonic valve was normal in structure. Pulmonic valve regurgitation is not visualized. Aorta: Aortic dilatation noted. There is mild dilatation of the aortic root, measuring 41 mm. There is mild dilatation of the ascending aorta, measuring 41 mm. IAS/Shunts: No atrial level shunt detected by color flow Doppler.  LEFT VENTRICLE PLAX 2D LVIDd:         6.60 cm LVIDs:         4.90 cm LV PW:         1.20 cm LV IVS:        1.10 cm LVOT diam:     2.60 cm LV SV:         95 LV SV Index:   34 LVOT Area:     5.31 cm  RIGHT VENTRICLE TAPSE (M-mode): 1.3 cm LEFT ATRIUM              Index       RIGHT ATRIUM           Index LA diam:        4.90 cm  1.74 cm/m  RA Area:     29.20 cm LA Vol (A2C):   76.5 ml  27.18 ml/m RA Volume:   96.20 ml  34.18 ml/m LA Vol (A4C):   102.0 ml 36.24 ml/m LA Biplane Vol: 92.1  ml  32.72 ml/m  AORTIC VALVE LVOT Vmax:   94.20 cm/s LVOT Vmean:  69.100 cm/s LVOT VTI:    0.178 m  AORTA Ao Root diam: 4.10 cm  SHUNTS Systemic VTI:  0.18 m Systemic Diam: 2.60 cm Lyman Bishop MD Electronically signed by Lyman Bishop MD Signature Date/Time: 07/29/2020/12:52:55 PM    Final         Scheduled Meds: . amLODipine  10 mg Oral Daily  . atorvastatin  10 mg Oral Daily  . cloNIDine  0.3 mg Oral TID  . docusate sodium  100 mg Oral BID  . enoxaparin (LOVENOX) injection  85 mg Subcutaneous Q24H  . insulin aspart  0-15 Units Subcutaneous TID WC  . insulin aspart  0-5 Units Subcutaneous QHS  . metoprolol succinate  12.5 mg Oral Daily  . sodium chloride flush  3 mL Intravenous Q12H   Continuous Infusions: .  ceFAZolin (ANCEF) IV      Assessment & Plan:   Principal Problem:   Sepsis due to cellulitis [facility]) Active Problems:   Diabetes mellitus type 2 in obese (HCC)   Hyperlipidemia   Morbid obesity (HCC)   OSA  (obstructive sleep apnea)   Essential hypertension   AKI (acute kidney injury) ([place])   Stage 3a chronic kidney disease ([place])   1-Sepsis secondary to left lower extremity cellulitis: Patient presented with leukocytosis, tachycardia, tachypnea fever. Blood culture and urine culture no growth to date  Doppler negative for DVT , limited in some areas 11/23-leukocytosis improving slowly CT findings as above-limited due to lack of contrast given CKD. R/o abscess.  Cellulitis not much improvement on exam ID following, d/c'd vanco and cefepime, started narrow coverage with iv cefazolin Keep legs elevated    2-AKI on CKD stage IIIa: Present with a creatinine at 2.6, prior creatinine 1.5 11/22-improving with IVF, but will dc as pt LE edema appears worse 11/23-creatinine down a bit today Micardis and lasix initially on hold . Use lasix prn Avoid nephrotoxic meds Hold off on consulting nephrology    3.LE edema- pt on amlodipine, but I dont think all edema due to this.  BNP mildly elevated, although likely underestimated in light of his obesity Echo nml EF Aortic dilatation 60mm. Mild LVH, indeterminate diastolic filling. However with LVF likely has some diastolic dysfunction. Will start lasix 40mg  iv once and reassess in am Monitor renal function closely  4-Diabetes type 2: hb A1c 7.0 BG stable Holding Glucophage and glucotrol Continue RISS   5-HTN; Continue with Norvasc, clonidinde  Mild hyponatremia:  Treated initially with ivf.   HLD; Continue with lipitor.   OSA; continue with CPAP.   Morbid Obesity:  Needs weight loss Complicates overall health/management.   PT/OT reconsult when able to ambulate   DVT prophylaxis: Lovenox Code Status: Full code Family Communication: None at bedside  Status is: Inpatient  Remains inpatient appropriate because:IV treatments appropriate due to intensity of illness or inability to take PO   Dispo: The patient is from:  Home              Anticipated d/c is to: TBD              Anticipated d/c date is: 3 days              Patient currently is not medically stable to d/c.needs iv meds            LOS: 4 days   Time spent: 45 minutes with more than 50% on  COC    Nolberto Hanlon, MD [facility] Pager [phone]  If 7PM-7AM, please contact night-coverage [email or web address] Password [email or web address] 07/30/2020, 1:18 PM

## 2020-07-30 NOTE — Progress Notes (Signed)
PT Cancellation Note  Patient Details Name: Gerald Day MRN: [id number] DOB: 10-06-1955   Cancelled Treatment:    Reason Eval/Treat Not Completed: Medical issues which prohibited therapy discussed case with MD, who recommends holding PT today due to medical status. Will f/u for eval on next day of service if medically ready.    Windell Norfolk, DPT, PN1   Supplemental Physical Therapist [facility]    Pager [phone] Acute Rehab Office [phone]

## 2020-07-30 NOTE — Progress Notes (Signed)
[place] [facility]  Date of Admission:  07/26/2020     Total days of antibiotics: 5         Current antibiotics:  Day 5 cefepime Day 5 vancomycin   Reason for visit: Follow up on cellulitis   ASSESSMENT AND PLAN:   Gerald Day is a 64 y.o. male with left lower extremity cellulitis that has been slow to resolve in the setting of extensive lower extremity edema.  CT overnight was limited due to lack of contrast given CKD but did not show a deeper space infection or abscess.  I think this is most likely secondary to Strep.  # Left lower extremity cellulitis -- stop vancomycin and cefepime -- narrow to cefazolin 2gm IV q8h -- will follow -- encouraged him to elevate leg as much as possible  Other issues: # DM, HTN, CHF, CKD, AKI    SUBJECTIVE:   He feels about the same today.  His leg is hurting.  He has not been doing a great job keeping it elevated.  Currently afebrile.  No knee pain.  Review of Systems: As noted above.  All other systems reviewed and are negative.   OBJECTIVE:   Allergies  Allergen Reactions  . Penicillins Other (See Comments)    Unknown childhood allergic reaction Has patient had a PCN reaction causing immediate rash, facial/tongue/throat swelling, SOB or lightheadedness with hypotension: Unknown Has patient had a PCN reaction causing severe rash involving mucus membranes or skin necrosis: Unknown Has patient had a PCN reaction that required hospitalization: No Has patient had a PCN reaction occurring within the last 10 years: No If all of the above answers are "NO", then may proceed with Cephalosporin use.   . Tizanidine Other (See Comments)    07/26/20: Pt does not recognize drug and does not remember being allergic to it.    Blood pressure (!) 157/80, pulse (!) 121, temperature 99.2 F (37.3 C), temperature source Oral, resp. rate 20, height 6' (1.829 m), weight (!) 180 kg, SpO2 98 %. Body mass index is 53.82  kg/m.  Physical Exam Constitutional:      General: He is not in acute distress.    Appearance: He is well-developed. He is obese.  HENT:     Head: Normocephalic and atraumatic.  Pulmonary:     Comments: Appears mildly short of breath.  Musculoskeletal:        General: Swelling and tenderness present.     Right lower leg: Edema present.     Comments: Skin thickening of lower extremity with evidence of chronic venous stasis.  Small scratch on left great toe with out warmth or erythema.  Massive edema of lower extremity.  Foot not elevated.   Neurological:     General: No focal deficit present.     Mental Status: He is alert and oriented to person, place, and time.  Psychiatric:        Mood and Affect: Mood normal.        Behavior: Behavior normal.      Lab Results & Microbiology Lab Results  Component Value Date   WBC 17.2 (H) 07/29/2020   HGB 9.7 (L) 07/29/2020   HCT 29.5 (L) 07/29/2020   MCV 91.3 07/29/2020   PLT 260 07/29/2020    Lab Results  Component Value Date   NA 137 07/30/2020   K 4.5 07/30/2020   CO2 20 (L) 07/30/2020   GLUCOSE 191 (H) 07/30/2020   BUN  33 (H) 07/30/2020   CREATININE 1.84 (H) 07/30/2020   CALCIUM 8.2 (L) 07/30/2020   GFRNONAA 40 (L) 07/30/2020   GFRAA >60 02/25/2018    Lab Results  Component Value Date   ALT 22 07/26/2020   AST 21 07/26/2020   ALKPHOS 87 07/26/2020   BILITOT 1.3 (H) 07/26/2020     I have reviewed the micro and lab results in Epic.  Imaging CT TIBIA FIBULA LEFT WO CONTRAST  Result Date: 07/30/2020 CLINICAL DATA:  Left lower extremity pain and swelling.  Sepsis. EXAM: CT OF THE LOWER LEFT EXTREMITY WITHOUT CONTRAST TECHNIQUE: Multidetector CT imaging of the lower left extremity was performed according to the standard protocol. COMPARISON:  None. FINDINGS: Severe and diffuse skin thickening, subcutaneous soft tissue swelling/edema/fluid and what appear to be wounds along the lateral aspect of the lower extremity. I do  not see a discrete fluid collection but exam limited without contrast to assess for abscess. No findings suspicious for myofasciitis or pyomyositis. Moderate knee and ankle joint degenerative changes but no destructive bony changes to suggest septic arthritis. There is a moderate-sized knee joint effusion noted. No destructive bony changes to suggest osteomyelitis. There is a bony exostosis projecting off the posteromedial aspect of the tibia which is likely due to a fascial attachment site with calcification. Age advanced vascular calcifications. IMPRESSION: 1. Severe and diffuse skin thickening, subcutaneous soft tissue swelling/edema/fluid and what appear to be wounds along the lateral aspect of the lower extremity. No discrete fluid collection but exam limited without contrast to assess for abscess. 2. No findings suspicious for myofasciitis or pyomyositis. 3. No CT findings suspicious for septic arthritis or osteomyelitis. 4. Moderate-sized knee joint effusion. 5. Age advanced vascular calcifications. Electronically Signed   By: Marijo Sanes M.D.   On: 07/30/2020 07:49   CT FOOT LEFT WO CONTRAST  Result Date: 07/30/2020 CLINICAL DATA:  Left foot pain and swelling. EXAM: CT OF THE LEFT FOOT WITHOUT CONTRAST TECHNIQUE: Multidetector CT imaging of the left foot was performed according to the standard protocol. Multiplanar CT image reconstructions were also generated. COMPARISON:  None. FINDINGS: Diffuse and marked subcutaneous soft tissue swelling/edema/fluid most notable along the dorsum of the foot. No discrete fluid collection to suggest a drainable abscess but exam limited without contrast. No findings suspicious for myofasciitis or pyomyositis. Degenerative changes involving the ankle and foot but no CT findings to suggest septic arthritis or osteomyelitis. Age advanced vascular calcifications. IMPRESSION: 1. Diffuse and marked subcutaneous soft tissue swelling/edema/fluid most notable along the dorsum  of the foot. No discrete fluid collection to suggest a drainable abscess but exam limited without contrast. 2. No findings suspicious for myofasciitis or pyomyositis. 3. Degenerative changes involving the ankle and foot but no CT findings to suggest septic arthritis or osteomyelitis. 4. Age advanced vascular calcifications. Electronically Signed   By: Marijo Sanes M.D.   On: 07/30/2020 07:52   DG CHEST PORT 1 VIEW  Result Date: 07/29/2020 CLINICAL DATA:  64 year old male with fever. EXAM: PORTABLE CHEST 1 VIEW COMPARISON:  Chest radiograph dated 07/26/2020. FINDINGS: No focal consolidation, pleural effusion or pneumothorax. The left costophrenic angle has been excluded from the image and not evaluated. There is stable cardiomegaly. Mild bilateral hilar prominence suggestive of pulmonary hypertension. No acute osseous pathology. IMPRESSION: No acute cardiopulmonary process.  No interval change. Electronically Signed   By: Anner Crete M.D.   On: 07/29/2020 20:18   ECHOCARDIOGRAM COMPLETE  Result Date: 07/29/2020    ECHOCARDIOGRAM REPORT  [name] Date of Exam: 07/29/2020 Medical Rec #:  [id number]        Height:       72.0 in Accession #:    [id number]       Weight:       385.1 lb Date of Birth:  08/01/1956        BSA:          2.815 m Patient Age:    18 years         BP:           144/93 mmHg Patient Gender: M                HR:           120 bpm. Exam Location:  Inpatient Procedure: 2D Echo and Intracardiac Opacification Agent Indications:    Abnormal ECG  History:        Patient has prior history of Echocardiogram examinations. CHF;                 Risk Factors:Hypertension, Dyslipidemia and Diabetes.  Sonographer:    Mikki Santee RDCS (AE) Referring Phys: [id number] [place]  1. Left ventricular ejection fraction, by estimation, is 55 to 60%. The left ventricle has normal function. The left ventricle has no regional wall motion abnormalities. There is mild  left ventricular hypertrophy. Indeterminate diastolic filling due to E-A fusion.  2. Right ventricular systolic function was not well visualized. The right ventricular size is not well visualized.  3. Left atrial size was mildly dilated.  4. Right atrial size was mildly dilated.  5. The mitral valve is grossly normal. Trivial mitral valve regurgitation.  6. The aortic valve is tricuspid. Aortic valve regurgitation is not visualized.  7. Aortic dilatation noted. There is mild dilatation of the aortic root, measuring 41 mm. There is mild dilatation of the ascending aorta, measuring 41 mm. Comparison(s): No prior Echocardiogram. FINDINGS  Left Ventricle: Left ventricular ejection fraction, by estimation, is 55 to 60%. The left ventricle has normal function. The left ventricle has no regional wall motion abnormalities. Definity contrast agent was given IV to delineate the left ventricular  endocardial borders. The left ventricular internal cavity size was normal in size. There is mild left ventricular hypertrophy. Indeterminate diastolic filling due to E-A fusion. Right Ventricle: The right ventricular size is not well visualized. Right vetricular wall thickness was not well visualized. Right ventricular systolic function was not well visualized. Left Atrium: Left atrial size was mildly dilated. Right Atrium: Right atrial size was mildly dilated. Pericardium: There is no evidence of pericardial effusion. Mitral Valve: The mitral valve is grossly normal. Trivial mitral valve regurgitation. Tricuspid Valve: The tricuspid valve is grossly normal. Tricuspid valve regurgitation is trivial. Aortic Valve: The aortic valve is tricuspid. Aortic valve regurgitation is not visualized. Pulmonic Valve: The pulmonic valve was normal in structure. Pulmonic valve regurgitation is not visualized. Aorta: Aortic dilatation noted. There is mild dilatation of the aortic root, measuring 41 mm. There is mild dilatation of the ascending aorta,  measuring 41 mm. IAS/Shunts: No atrial level shunt detected by color flow Doppler.  LEFT VENTRICLE PLAX 2D LVIDd:         6.60 cm LVIDs:         4.90 cm LV PW:         1.20 cm LV IVS:        1.10 cm LVOT diam:     2.60  cm LV SV:         95 LV SV Index:   34 LVOT Area:     5.31 cm  RIGHT VENTRICLE TAPSE (M-mode): 1.3 cm LEFT ATRIUM              Index       RIGHT ATRIUM           Index LA diam:        4.90 cm  1.74 cm/m  RA Area:     29.20 cm LA Vol (A2C):   76.5 ml  27.18 ml/m RA Volume:   96.20 ml  34.18 ml/m LA Vol (A4C):   102.0 ml 36.24 ml/m LA Biplane Vol: 92.1 ml  32.72 ml/m  AORTIC VALVE LVOT Vmax:   94.20 cm/s LVOT Vmean:  69.100 cm/s LVOT VTI:    0.178 m  AORTA Ao Root diam: 4.10 cm  SHUNTS Systemic VTI:  0.18 m Systemic Diam: 2.60 cm Lyman Bishop MD Electronically signed by Lyman Bishop MD Signature Date/Time: 07/29/2020/12:52:55 PM    Final       [facility] [phone] pager 07/30/2020, 10:30 AM

## 2020-07-30 NOTE — Progress Notes (Signed)
OT Cancellation Note  Patient Details Name: Gerald Day MRN: [id number] DOB: 1956/08/19   Cancelled Treatment:    Reason Eval/Treat Not Completed: Medical issues which prohibited therapy. Discussed case with MD, who recommends holding OT today due to medical status. Will continue to follow acutely for evaluation on next day of service if medically ready.  [place] 07/30/2020, 9:15 AM   Jesse Sans OTR/L Acute Rehabilitation Services Pager: [phone] Office: [phone]

## 2020-07-31 DIAGNOSIS — R600 Localized edema: Secondary | ICD-10-CM

## 2020-07-31 DIAGNOSIS — A419 Sepsis, unspecified organism: Secondary | ICD-10-CM | POA: Diagnosis not present

## 2020-07-31 DIAGNOSIS — I509 Heart failure, unspecified: Secondary | ICD-10-CM

## 2020-07-31 DIAGNOSIS — I4892 Unspecified atrial flutter: Secondary | ICD-10-CM | POA: Diagnosis not present

## 2020-07-31 DIAGNOSIS — N189 Chronic kidney disease, unspecified: Secondary | ICD-10-CM

## 2020-07-31 DIAGNOSIS — I10 Essential (primary) hypertension: Secondary | ICD-10-CM | POA: Diagnosis not present

## 2020-07-31 DIAGNOSIS — L0390 Cellulitis, unspecified: Secondary | ICD-10-CM | POA: Diagnosis not present

## 2020-07-31 DIAGNOSIS — I130 Hypertensive heart and chronic kidney disease with heart failure and stage 1 through stage 4 chronic kidney disease, or unspecified chronic kidney disease: Secondary | ICD-10-CM

## 2020-07-31 DIAGNOSIS — E1122 Type 2 diabetes mellitus with diabetic chronic kidney disease: Secondary | ICD-10-CM

## 2020-07-31 LAB — CULTURE, BLOOD (ROUTINE X 2)
Culture: NO GROWTH
Culture: NO GROWTH
Special Requests: ADEQUATE

## 2020-07-31 LAB — BASIC METABOLIC PANEL
Anion gap: 11 (ref 5–15)
BUN: 32 mg/dL — ABNORMAL HIGH (ref 8–23)
CO2: 18 mmol/L — ABNORMAL LOW (ref 22–32)
Calcium: 8.1 mg/dL — ABNORMAL LOW (ref 8.9–10.3)
Chloride: 107 mmol/L (ref 98–111)
Creatinine, Ser: 1.88 mg/dL — ABNORMAL HIGH (ref 0.61–1.24)
GFR, Estimated: 39 mL/min — ABNORMAL LOW (ref >60)
Glucose, Bld: 199 mg/dL — ABNORMAL HIGH (ref 70–99)
Potassium: 4.2 mmol/L (ref 3.5–5.1)
Sodium: 136 mmol/L (ref 135–145)

## 2020-07-31 LAB — GLUCOSE, CAPILLARY
Glucose-Capillary: 187 mg/dL — ABNORMAL HIGH (ref 70–99)
Glucose-Capillary: 223 mg/dL — ABNORMAL HIGH (ref 70–99)
Glucose-Capillary: 198 mg/dL — ABNORMAL HIGH (ref 70–99)
Glucose-Capillary: 209 mg/dL — ABNORMAL HIGH (ref 70–99)

## 2020-07-31 LAB — URINE CULTURE: Culture: NO GROWTH

## 2020-07-31 MED ORDER — METOPROLOL TARTRATE 25 MG PO TABS
25.0000 mg | ORAL_TABLET | Freq: Four times a day (QID) | ORAL | Status: DC
  Administered 2020-07-31 – 2020-08-01 (×5): 25 mg via ORAL
  Filled 2020-07-31 (×6): qty 1

## 2020-07-31 MED ORDER — FUROSEMIDE 10 MG/ML IJ SOLN
40.0000 mg | Freq: Once | INTRAMUSCULAR | Status: AC
  Administered 2020-07-31: 40 mg via INTRAVENOUS
  Filled 2020-07-31: qty 4

## 2020-07-31 MED ORDER — INSULIN GLARGINE 100 UNIT/ML ~~LOC~~ SOLN
10.0000 [IU] | Freq: Every day | SUBCUTANEOUS | Status: DC
  Administered 2020-07-31 – 2020-08-01 (×2): 10 [IU] via SUBCUTANEOUS
  Filled 2020-07-31 (×3): qty 0.1

## 2020-07-31 MED ORDER — DILTIAZEM HCL ER COATED BEADS 120 MG PO CP24
120.0000 mg | ORAL_CAPSULE | Freq: Every day | ORAL | Status: DC
  Administered 2020-07-31: 120 mg via ORAL
  Filled 2020-07-31: qty 1

## 2020-07-31 MED ORDER — METOPROLOL SUCCINATE ER 25 MG PO TB24: 25.0000 mg | ORAL_TABLET | Freq: Four times a day (QID) | ORAL | Status: DC

## 2020-07-31 NOTE — Progress Notes (Signed)
[place] for Infectious Disease  Date of Admission:  07/26/2020     Total days of antibiotics: 6         Current antibiotics:  Day 2 cefazolin  Reason for visit: Follow up on cellulitis   ASSESSMENT AND PLAN:   Gerald Day is a 64 y.o. male with left lower extremity cellulitis that has been slow to resolve in the setting of extensive lower extremity edema.  CT obtained 07/29/20 was limited due to lack of contrast because of his CKD but did not show a deeper space infection or abscess.    # Left lower extremity cellulitis -- continue cefazolin 2gm IV q8h -- will follow -- encouraged him to elevate leg as much as possible -- diuresis per primary team  Other issues: # DM, HTN, CHF, CKD, AKI    SUBJECTIVE:   Seen yesterday by cardiology for atrial flutter. Continued fevers but trend seems to be improving.  Tmax 101 Renal function is stable.  No new WBC.  He currently denies fevers, chills.  Overall feels terrible.  Having pain in legs and knees.  Not able to get out of bed like he would want to.  Tolerating Rx.    Review of Systems: As noted above.  All other systems reviewed and are negative.   OBJECTIVE:   Allergies  Allergen Reactions  . Penicillins Other (See Comments)    Unknown childhood allergic reaction Has patient had a PCN reaction causing immediate rash, facial/tongue/throat swelling, SOB or lightheadedness with hypotension: Unknown Has patient had a PCN reaction causing severe rash involving mucus membranes or skin necrosis: Unknown Has patient had a PCN reaction that required hospitalization: No Has patient had a PCN reaction occurring within the last 10 years: No If all of the above answers are "NO", then may proceed with Cephalosporin use.   . Tizanidine Other (See Comments)    07/26/20: Pt does not recognize drug and does not remember being allergic to it.    Blood pressure 138/89, pulse (!) 119, temperature 98.9 F (37.2 C),  temperature source Oral, resp. rate 20, height 6' (1.829 m), weight (!) 183.7 kg, SpO2 96 %. Body mass index is 54.93 kg/m.  Physical Exam Constitutional:      Appearance: Normal appearance. He is obese.  HENT:     Head: Normocephalic and atraumatic.     Comments: Wearing CPAP. Eyes:     Extraocular Movements: Extraocular movements intact.     Conjunctiva/sclera: Conjunctivae normal.  Pulmonary:     Effort: Pulmonary effort is normal. No respiratory distress.  Musculoskeletal:        General: Swelling present.     Right lower leg: Edema present.     Comments: Skin thickening of lower extremity with evidence of chronic venous stasis bilaterally.  Small scratch on left great toe with out warmth or erythema.  Massive edema of lower extremity improved.  Skin:    Findings: Erythema present.  Neurological:     General: No focal deficit present.     Mental Status: He is alert and oriented to person, place, and time.  Psychiatric:        Mood and Affect: Mood normal.        Behavior: Behavior normal.      Lab Results & Microbiology Lab Results  Component Value Date   WBC 17.2 (H) 07/29/2020   HGB 9.7 (L) 07/29/2020   HCT 29.5 (L) 07/29/2020   MCV 91.3 07/29/2020  PLT 260 07/29/2020    Lab Results  Component Value Date   NA 136 07/31/2020   K 4.2 07/31/2020   CO2 18 (L) 07/31/2020   GLUCOSE 199 (H) 07/31/2020   BUN 32 (H) 07/31/2020   CREATININE 1.88 (H) 07/31/2020   CALCIUM 8.1 (L) 07/31/2020   GFRNONAA 39 (L) 07/31/2020   GFRAA >60 02/25/2018    Lab Results  Component Value Date   ALT 22 07/26/2020   AST 21 07/26/2020   ALKPHOS 87 07/26/2020   BILITOT 1.3 (H) 07/26/2020     I have reviewed the micro and lab results in Epic.  Imaging CT TIBIA FIBULA LEFT WO CONTRAST  Result Date: 07/30/2020 CLINICAL DATA:  Left lower extremity pain and swelling.  Sepsis. EXAM: CT OF THE LOWER LEFT EXTREMITY WITHOUT CONTRAST TECHNIQUE: Multidetector CT imaging of the lower  left extremity was performed according to the standard protocol. COMPARISON:  None. FINDINGS: Severe and diffuse skin thickening, subcutaneous soft tissue swelling/edema/fluid and what appear to be wounds along the lateral aspect of the lower extremity. I do not see a discrete fluid collection but exam limited without contrast to assess for abscess. No findings suspicious for myofasciitis or pyomyositis. Moderate knee and ankle joint degenerative changes but no destructive bony changes to suggest septic arthritis. There is a moderate-sized knee joint effusion noted. No destructive bony changes to suggest osteomyelitis. There is a bony exostosis projecting off the posteromedial aspect of the tibia which is likely due to a fascial attachment site with calcification. Age advanced vascular calcifications. IMPRESSION: 1. Severe and diffuse skin thickening, subcutaneous soft tissue swelling/edema/fluid and what appear to be wounds along the lateral aspect of the lower extremity. No discrete fluid collection but exam limited without contrast to assess for abscess. 2. No findings suspicious for myofasciitis or pyomyositis. 3. No CT findings suspicious for septic arthritis or osteomyelitis. 4. Moderate-sized knee joint effusion. 5. Age advanced vascular calcifications. Electronically Signed   By: Marijo Sanes M.D.   On: 07/30/2020 07:49   CT FOOT LEFT WO CONTRAST  Result Date: 07/30/2020 CLINICAL DATA:  Left foot pain and swelling. EXAM: CT OF THE LEFT FOOT WITHOUT CONTRAST TECHNIQUE: Multidetector CT imaging of the left foot was performed according to the standard protocol. Multiplanar CT image reconstructions were also generated. COMPARISON:  None. FINDINGS: Diffuse and marked subcutaneous soft tissue swelling/edema/fluid most notable along the dorsum of the foot. No discrete fluid collection to suggest a drainable abscess but exam limited without contrast. No findings suspicious for myofasciitis or pyomyositis.  Degenerative changes involving the ankle and foot but no CT findings to suggest septic arthritis or osteomyelitis. Age advanced vascular calcifications. IMPRESSION: 1. Diffuse and marked subcutaneous soft tissue swelling/edema/fluid most notable along the dorsum of the foot. No discrete fluid collection to suggest a drainable abscess but exam limited without contrast. 2. No findings suspicious for myofasciitis or pyomyositis. 3. Degenerative changes involving the ankle and foot but no CT findings to suggest septic arthritis or osteomyelitis. 4. Age advanced vascular calcifications. Electronically Signed   By: Marijo Sanes M.D.   On: 07/30/2020 07:52   DG CHEST PORT 1 VIEW  Result Date: 07/29/2020 CLINICAL DATA:  64 year old male with fever. EXAM: PORTABLE CHEST 1 VIEW COMPARISON:  Chest radiograph dated 07/26/2020. FINDINGS: No focal consolidation, pleural effusion or pneumothorax. The left costophrenic angle has been excluded from the image and not evaluated. There is stable cardiomegaly. Mild bilateral hilar prominence suggestive of pulmonary hypertension. No acute osseous pathology. IMPRESSION:  No acute cardiopulmonary process.  No interval change. Electronically Signed   By: Anner Crete M.D.   On: 07/29/2020 20:18   ECHOCARDIOGRAM COMPLETE  Result Date: 07/29/2020    ECHOCARDIOGRAM REPORT   [name] Date of Exam: 07/29/2020 Medical Rec #:  [id number]        Height:       72.0 in Accession #:    [id number]       Weight:       385.1 lb Date of Birth:  12-15-1955        BSA:          2.815 m Patient Age:    54 years         BP:           144/93 mmHg Patient Gender: M                HR:           120 bpm. Exam Location:  Inpatient Procedure: 2D Echo and Intracardiac Opacification Agent Indications:    Abnormal ECG  History:        Patient has prior history of Echocardiogram examinations. CHF;                 Risk Factors:Hypertension, Dyslipidemia and Diabetes.  Sonographer:     Mikki Santee RDCS (AE) Referring Phys: [id number] [place]  1. Left ventricular ejection fraction, by estimation, is 55 to 60%. The left ventricle has normal function. The left ventricle has no regional wall motion abnormalities. There is mild left ventricular hypertrophy. Indeterminate diastolic filling due to E-A fusion.  2. Right ventricular systolic function was not well visualized. The right ventricular size is not well visualized.  3. Left atrial size was mildly dilated.  4. Right atrial size was mildly dilated.  5. The mitral valve is grossly normal. Trivial mitral valve regurgitation.  6. The aortic valve is tricuspid. Aortic valve regurgitation is not visualized.  7. Aortic dilatation noted. There is mild dilatation of the aortic root, measuring 41 mm. There is mild dilatation of the ascending aorta, measuring 41 mm. Comparison(s): No prior Echocardiogram. FINDINGS  Left Ventricle: Left ventricular ejection fraction, by estimation, is 55 to 60%. The left ventricle has normal function. The left ventricle has no regional wall motion abnormalities. Definity contrast agent was given IV to delineate the left ventricular  endocardial borders. The left ventricular internal cavity size was normal in size. There is mild left ventricular hypertrophy. Indeterminate diastolic filling due to E-A fusion. Right Ventricle: The right ventricular size is not well visualized. Right vetricular wall thickness was not well visualized. Right ventricular systolic function was not well visualized. Left Atrium: Left atrial size was mildly dilated. Right Atrium: Right atrial size was mildly dilated. Pericardium: There is no evidence of pericardial effusion. Mitral Valve: The mitral valve is grossly normal. Trivial mitral valve regurgitation. Tricuspid Valve: The tricuspid valve is grossly normal. Tricuspid valve regurgitation is trivial. Aortic Valve: The aortic valve is tricuspid. Aortic valve regurgitation is not  visualized. Pulmonic Valve: The pulmonic valve was normal in structure. Pulmonic valve regurgitation is not visualized. Aorta: Aortic dilatation noted. There is mild dilatation of the aortic root, measuring 41 mm. There is mild dilatation of the ascending aorta, measuring 41 mm. IAS/Shunts: No atrial level shunt detected by color flow Doppler.  LEFT VENTRICLE PLAX 2D LVIDd:         6.60 cm LVIDs:  4.90 cm LV PW:         1.20 cm LV IVS:        1.10 cm LVOT diam:     2.60 cm LV SV:         95 LV SV Index:   34 LVOT Area:     5.31 cm  RIGHT VENTRICLE TAPSE (M-mode): 1.3 cm LEFT ATRIUM              Index       RIGHT ATRIUM           Index LA diam:        4.90 cm  1.74 cm/m  RA Area:     29.20 cm LA Vol (A2C):   76.5 ml  27.18 ml/m RA Volume:   96.20 ml  34.18 ml/m LA Vol (A4C):   102.0 ml 36.24 ml/m LA Biplane Vol: 92.1 ml  32.72 ml/m  AORTIC VALVE LVOT Vmax:   94.20 cm/s LVOT Vmean:  69.100 cm/s LVOT VTI:    0.178 m  AORTA Ao Root diam: 4.10 cm  SHUNTS Systemic VTI:  0.18 m Systemic Diam: 2.60 cm Lyman Bishop MD Electronically signed by Lyman Bishop MD Signature Date/Time: 07/29/2020/12:52:55 PM    Final      [facility] [phone] pager 07/31/2020, 8:18 AM

## 2020-07-31 NOTE — Evaluation (Signed)
Physical Therapy Evaluation Patient Details Name: Gerald Day MRN: [id number] DOB: Oct 06, 1955 Today's Date: 07/31/2020   History of Present Illness  Pt is 64 yo male with PMH including OSA on CPAP, morbid obesity, HTN, HLD, DM, CHF, cellulitis, and pt reports hx of gout.  He presented with L LE cellulitis, LE edema, and shortness of breath.  Pt admitted with sepsis due to cellulitis.  Pt additionally with aflutter with RVR, cardiology consult, and possible TEE on Friday.  Clinical Impression  Pt admitted with above diagnosis. Pt is normally very independent and lives alone.  Today, he was significantly limited by bil knee and leg pain.  He was only able to tolerate minimal ROM and required max A x 2 transfer to EOB.  Pt unable to progress OOB due to pain.  Educated pt and son on PT recommendations, ROM exercises, and premedication for pain so that pt able to participate further with therapy.  Pt currently with functional limitations due to the deficits listed below (see PT Problem List). Pt will benefit from skilled PT to increase their independence and safety with mobility to allow discharge to the venue listed below.       Follow Up Recommendations SNF;Supervision/Assistance - 24 hour    Equipment Recommendations  Other (comment) (bariatric RW, 3 in 1 w/ drop arm, w/c with drop arm, hoyer (further assessement next venue))    Recommendations for Other Services       Precautions / Restrictions Precautions Precautions: Fall Precaution Comments: on cpap      Mobility  Bed Mobility Overal bed mobility: Needs Assistance Bed Mobility: Supine to Sit;Sit to Supine     Supine to sit: Max assist;+2 for physical assistance Sit to supine: +2 for physical assistance;Max assist   General bed mobility comments: Assist with bil LE and to lift trunk; increased time and limited due to pain    Transfers                 General transfer comment: unable to  attempt  Ambulation/Gait                Stairs            Wheelchair Mobility    Modified Rankin (Stroke Patients Only)       Balance Overall balance assessment: Needs assistance Sitting-balance support: No upper extremity supported;Feet supported Sitting balance-Leahy Scale: Fair Sitting balance - Comments: EOB for 10-15 mins with supervision       Standing balance comment: unable                             Pertinent Vitals/Pain Pain Assessment: 0-10 Pain Score: 9  Pain Location: Bil knees - with any movement Pain Descriptors / Indicators: Throbbing;Sharp;Shooting Pain Intervention(s): Limited activity within patient's tolerance;Monitored during session;Other (comment);Repositioned;Relaxation (declined premedication)    Home Living Family/patient expects to be discharged to:: Private residence Living Arrangements: Alone Available Help at Discharge: Family;Available PRN/intermittently Type of Home: House Home Access: Stairs to enter Entrance Stairs-Rails: Can reach both Entrance Stairs-Number of Steps: 6 Home Layout: Able to live on main level with bedroom/bathroom Home Equipment: Crutches;Walker - 2 wheels      Prior Function Level of Independence: Independent         Comments: Independent with IADLs, driving, and community ambulation     Hand Dominance   Dominant Hand: Left    Extremity/Trunk Assessment   Upper Extremity Assessment Upper Extremity  Assessment: Overall WFL for tasks assessed    Lower Extremity Assessment Lower Extremity Assessment: LLE deficits/detail;RLE deficits/detail RLE Deficits / Details: ROM: hip and ankle WFL, knee limited by pain to ~ 5 to 80 degrees; MMT ankle 5/5, knee and hip 1/5 LLE Deficits / Details: ROM: hip WFL, ankle to neutral only, knee severly limitede by pain to ~5 to 40 degrees; MMT: ankle 3/5, hip and knee 1/5    Cervical / Trunk Assessment Cervical / Trunk Assessment: Normal   Communication   Communication: No difficulties  Cognition Arousal/Alertness: Awake/alert Behavior During Therapy: WFL for tasks assessed/performed Overall Cognitive Status: Within Functional Limits for tasks assessed                                        General Comments General comments (skin integrity, edema, etc.): son present; Pt and son educated on HEP of AROM as able (ankle pumps, hip rotation to neutral then quad set, heel slide with gait belt, knee flexion if sitting EOB); Also educated on premedication for pain to allow increased participation and recommendation for SNF    Exercises General Exercises - Lower Extremity Ankle Circles/Pumps: AROM;Both;20 reps;Supine;Seated Quad Sets: AROM;Both;10 reps;Supine (assist to rotate hip to neutral then quad set) Heel Slides: AAROM;Both;5 reps;Supine (limited motion) Other Exercises Other Exercises: Knee flexion at EOB -5 reps with 5-10 sec hold : PT assisted in lifting leg and pt flexed knee to tolerance with AAROM Other Exercises: Hip rotation from external to neutral x 10 bil in supine: initially AAROM then AROM   Assessment/Plan    PT Assessment Patient needs continued PT services  PT Problem List Decreased strength;Decreased mobility;Decreased range of motion;Decreased knowledge of precautions;Decreased activity tolerance;Decreased balance;Cardiopulmonary status limiting activity;Decreased knowledge of use of DME;Pain       PT Treatment Interventions DME instruction;Therapeutic activities;Modalities;Therapeutic exercise;Patient/family education;Balance training;Functional mobility training    PT Goals (Current goals can be found in the Care Plan section)  Acute Rehab PT Goals Patient Stated Goal: to have decreased pain and be able to move legs PT Goal Formulation: With patient/family Time For Goal Achievement: 08/14/20 Potential to Achieve Goals: Good    Frequency Min 2X/week   Barriers to discharge  Decreased caregiver support      Co-evaluation               AM-PAC PT "6 Clicks" Mobility  Outcome Measure Help needed turning from your back to your side while in a flat bed without using bedrails?: Total Help needed moving from lying on your back to sitting on the side of a flat bed without using bedrails?: Total Help needed moving to and from a bed to a chair (including a wheelchair)?: Total Help needed standing up from a chair using your arms (e.g., wheelchair or bedside chair)?: Total Help needed to walk in hospital room?: Total Help needed climbing 3-5 steps with a railing? : Total 6 Click Score: 6    End of Session   Activity Tolerance: Patient limited by pain Patient left: in bed;with call bell/phone within reach;with bed alarm set;with family/visitor present;Other (comment) (legs elevated) Nurse Communication: Mobility status PT Visit Diagnosis: Unsteadiness on feet (R26.81);Other abnormalities of gait and mobility (R26.89);Muscle weakness (generalized) (M62.81);Pain Pain - Right/Left:  (bil) Pain - part of body: Knee    Time: 1300-1345 PT Time Calculation (min) (ACUTE ONLY): 45 min   Charges:   PT Evaluation $PT  Eval Moderate Complexity: 1 Mod PT Treatments $Therapeutic Exercise: 8-22 mins $Therapeutic Activity: 8-22 mins        Abran Richard, PT Acute Rehab Services Pager [phone] [facility] [place] 07/31/2020, 3:15 PM

## 2020-07-31 NOTE — Progress Notes (Signed)
Progress Note  Patient Name: Gerald Day Date of Encounter: 07/31/2020  Primary Cardiologist:   Minus Breeding, MD   Subjective   He does not feel the palpitations.  He has no chest pain.    Inpatient Medications    Scheduled Meds: . amLODipine  10 mg Oral Daily  . atorvastatin  10 mg Oral Daily  . cloNIDine  0.3 mg Oral TID  . docusate sodium  100 mg Oral BID  . enoxaparin (LOVENOX) injection  85 mg Subcutaneous Q24H  . furosemide  40 mg Intravenous Once  . insulin aspart  0-15 Units Subcutaneous TID WC  . insulin aspart  0-5 Units Subcutaneous QHS  . insulin glargine  10 Units Subcutaneous Daily  . metoprolol succinate  25 mg Oral BID  . sodium chloride flush  3 mL Intravenous Q12H   Continuous Infusions: .  ceFAZolin (ANCEF) IV 2 g (07/31/20 0506)   PRN Meds: acetaminophen **OR** acetaminophen, hydrALAZINE, ondansetron **OR** ondansetron (ZOFRAN) IV, oxyCODONE   Vital Signs    Vitals:   07/30/20 2150 07/31/20 0124 07/31/20 0637 07/31/20 0920  BP: (!) 147/75 (!) 152/90 138/89 (!) 141/84  Pulse: (!) 115 (!) 118 (!) 119 (!) 120  Resp: 20 20  20   Temp: 99.3 F (37.4 C) 99.8 F (37.7 C) 98.9 F (37.2 C) 99.6 F (37.6 C)  TempSrc: Oral Oral Oral Oral  SpO2: 98% 99% 96% 98%  Weight: (!) 183.7 kg     Height:        Intake/Output Summary (Last 24 hours) at 07/31/2020 1153 Last data filed at 07/31/2020 0844 Gross per 24 hour  Intake 1755 ml  Output 1700 ml  Net 55 ml   Filed Weights   07/28/20 2018 07/30/20 0453 07/30/20 2150  Weight: (!) 174.7 kg (!) 180 kg (!) 183.7 kg    Telemetry    Atrial flutter with variable conduction. - Personally Reviewed  ECG    NA - Personally Reviewed  Physical Exam   GEN: No acute distress.   Neck: No  JVD Cardiac: IrregularRR, no murmurs, rubs, or gallops.  Respiratory: Clear  to auscultation bilaterally. GI: Soft, nontender, non-distended  MS:   Moderate left greater than right edema; No  deformity. Neuro:  Nonfocal  Psych: Normal affect   Labs    Chemistry Recent Labs  Lab 07/26/20 1450 07/27/20 0445 07/29/20 0310 07/30/20 0146 07/31/20 0455  NA  --    < > 137 137 136  K  --    < > 3.9 4.5 4.2  CL  --    < > 103 104 107  CO2  --    < > 18* 20* 18*  GLUCOSE  --    < > 194* 191* 199*  BUN  --    < > 35* 33* 32*  CREATININE  --    < > 1.94* 1.84* 1.88*  CALCIUM  --    < > 8.3* 8.2* 8.1*  PROT 6.9  --   --   --   --   ALBUMIN 2.7*  --   --   --   --   AST 21  --   --   --   --   ALT 22  --   --   --   --   ALKPHOS 87  --   --   --   --   BILITOT 1.3*  --   --   --   --  GFRNONAA  --    < > 38* 40* 39*  ANIONGAP  --    < > 16* 13 11   < > = values in this interval not displayed.     Hematology Recent Labs  Lab 07/27/20 0445 07/28/20 0454 07/29/20 0310  WBC 15.2* 19.3* 17.2*  RBC 3.16* 3.15* 3.23*  HGB 9.4* 9.4* 9.7*  HCT 28.9* 29.0* 29.5*  MCV 91.5 92.1 91.3  MCH 29.7 29.8 30.0  MCHC 32.5 32.4 32.9  RDW 14.2 14.6 14.7  PLT 205 239 260    Cardiac EnzymesNo results for input(s): TROPONINI in the last 168 hours. No results for input(s): TROPIPOC in the last 168 hours.   BNP Recent Labs  Lab 07/26/20 1346 07/29/20 0310  BNP 296.5* 356.8*     DDimer No results for input(s): DDIMER in the last 168 hours.   Radiology    CT TIBIA FIBULA LEFT WO CONTRAST  Result Date: 07/30/2020 CLINICAL DATA:  Left lower extremity pain and swelling.  Sepsis. EXAM: CT OF THE LOWER LEFT EXTREMITY WITHOUT CONTRAST TECHNIQUE: Multidetector CT imaging of the lower left extremity was performed according to the standard protocol. COMPARISON:  None. FINDINGS: Severe and diffuse skin thickening, subcutaneous soft tissue swelling/edema/fluid and what appear to be wounds along the lateral aspect of the lower extremity. I do not see a discrete fluid collection but exam limited without contrast to assess for abscess. No findings suspicious for myofasciitis or pyomyositis.  Moderate knee and ankle joint degenerative changes but no destructive bony changes to suggest septic arthritis. There is a moderate-sized knee joint effusion noted. No destructive bony changes to suggest osteomyelitis. There is a bony exostosis projecting off the posteromedial aspect of the tibia which is likely due to a fascial attachment site with calcification. Age advanced vascular calcifications. IMPRESSION: 1. Severe and diffuse skin thickening, subcutaneous soft tissue swelling/edema/fluid and what appear to be wounds along the lateral aspect of the lower extremity. No discrete fluid collection but exam limited without contrast to assess for abscess. 2. No findings suspicious for myofasciitis or pyomyositis. 3. No CT findings suspicious for septic arthritis or osteomyelitis. 4. Moderate-sized knee joint effusion. 5. Age advanced vascular calcifications. Electronically Signed   By: Marijo Sanes M.D.   On: 07/30/2020 07:49   CT FOOT LEFT WO CONTRAST  Result Date: 07/30/2020 CLINICAL DATA:  Left foot pain and swelling. EXAM: CT OF THE LEFT FOOT WITHOUT CONTRAST TECHNIQUE: Multidetector CT imaging of the left foot was performed according to the standard protocol. Multiplanar CT image reconstructions were also generated. COMPARISON:  None. FINDINGS: Diffuse and marked subcutaneous soft tissue swelling/edema/fluid most notable along the dorsum of the foot. No discrete fluid collection to suggest a drainable abscess but exam limited without contrast. No findings suspicious for myofasciitis or pyomyositis. Degenerative changes involving the ankle and foot but no CT findings to suggest septic arthritis or osteomyelitis. Age advanced vascular calcifications. IMPRESSION: 1. Diffuse and marked subcutaneous soft tissue swelling/edema/fluid most notable along the dorsum of the foot. No discrete fluid collection to suggest a drainable abscess but exam limited without contrast. 2. No findings suspicious for  myofasciitis or pyomyositis. 3. Degenerative changes involving the ankle and foot but no CT findings to suggest septic arthritis or osteomyelitis. 4. Age advanced vascular calcifications. Electronically Signed   By: Marijo Sanes M.D.   On: 07/30/2020 07:52   DG CHEST PORT 1 VIEW  Result Date: 07/29/2020 CLINICAL DATA:  64 year old male with fever. EXAM: PORTABLE CHEST 1  VIEW COMPARISON:  Chest radiograph dated 07/26/2020. FINDINGS: No focal consolidation, pleural effusion or pneumothorax. The left costophrenic angle has been excluded from the image and not evaluated. There is stable cardiomegaly. Mild bilateral hilar prominence suggestive of pulmonary hypertension. No acute osseous pathology. IMPRESSION: No acute cardiopulmonary process.  No interval change. Electronically Signed   By: Anner Crete M.D.   On: 07/29/2020 20:18   ECHOCARDIOGRAM COMPLETE  Result Date: 07/29/2020    ECHOCARDIOGRAM REPORT   Patient Name:   MELODY SAVIDGE Date of Exam: 07/29/2020 Medical Rec #:  [id number]        Height:       72.0 in Accession #:    [id number]       Weight:       385.1 lb Date of Birth:  February 08, 1956        BSA:          2.815 m Patient Age:    27 years         BP:           144/93 mmHg Patient Gender: M                HR:           120 bpm. Exam Location:  Inpatient Procedure: 2D Echo and Intracardiac Opacification Agent Indications:    Abnormal ECG  History:        Patient has prior history of Echocardiogram examinations. CHF;                 Risk Factors:Hypertension, Dyslipidemia and Diabetes.  Sonographer:    Mikki Santee RDCS (AE) Referring Phys: [id number] [place]  1. Left ventricular ejection fraction, by estimation, is 55 to 60%. The left ventricle has normal function. The left ventricle has no regional wall motion abnormalities. There is mild left ventricular hypertrophy. Indeterminate diastolic filling due to E-A fusion.  2. Right ventricular systolic function was not well  visualized. The right ventricular size is not well visualized.  3. Left atrial size was mildly dilated.  4. Right atrial size was mildly dilated.  5. The mitral valve is grossly normal. Trivial mitral valve regurgitation.  6. The aortic valve is tricuspid. Aortic valve regurgitation is not visualized.  7. Aortic dilatation noted. There is mild dilatation of the aortic root, measuring 41 mm. There is mild dilatation of the ascending aorta, measuring 41 mm. Comparison(s): No prior Echocardiogram. FINDINGS  Left Ventricle: Left ventricular ejection fraction, by estimation, is 55 to 60%. The left ventricle has normal function. The left ventricle has no regional wall motion abnormalities. Definity contrast agent was given IV to delineate the left ventricular  endocardial borders. The left ventricular internal cavity size was normal in size. There is mild left ventricular hypertrophy. Indeterminate diastolic filling due to E-A fusion. Right Ventricle: The right ventricular size is not well visualized. Right vetricular wall thickness was not well visualized. Right ventricular systolic function was not well visualized. Left Atrium: Left atrial size was mildly dilated. Right Atrium: Right atrial size was mildly dilated. Pericardium: There is no evidence of pericardial effusion. Mitral Valve: The mitral valve is grossly normal. Trivial mitral valve regurgitation. Tricuspid Valve: The tricuspid valve is grossly normal. Tricuspid valve regurgitation is trivial. Aortic Valve: The aortic valve is tricuspid. Aortic valve regurgitation is not visualized. Pulmonic Valve: The pulmonic valve was normal in structure. Pulmonic valve regurgitation is not visualized. Aorta: Aortic dilatation noted. There is mild dilatation of the aortic root,  measuring 41 mm. There is mild dilatation of the ascending aorta, measuring 41 mm. IAS/Shunts: No atrial level shunt detected by color flow Doppler.  LEFT VENTRICLE PLAX 2D LVIDd:         6.60 cm  LVIDs:         4.90 cm LV PW:         1.20 cm LV IVS:        1.10 cm LVOT diam:     2.60 cm LV SV:         95 LV SV Index:   34 LVOT Area:     5.31 cm  RIGHT VENTRICLE TAPSE (M-mode): 1.3 cm LEFT ATRIUM              Index       RIGHT ATRIUM           Index LA diam:        4.90 cm  1.74 cm/m  RA Area:     29.20 cm LA Vol (A2C):   76.5 ml  27.18 ml/m RA Volume:   96.20 ml  34.18 ml/m LA Vol (A4C):   102.0 ml 36.24 ml/m LA Biplane Vol: 92.1 ml  32.72 ml/m  AORTIC VALVE LVOT Vmax:   94.20 cm/s LVOT Vmean:  69.100 cm/s LVOT VTI:    0.178 m  AORTA Ao Root diam: 4.10 cm  SHUNTS Systemic VTI:  0.18 m Systemic Diam: 2.60 cm Lyman Bishop MD Electronically signed by Lyman Bishop MD Signature Date/Time: 07/29/2020/12:52:55 PM    Final     Cardiac Studies   Echo:    1. Left ventricular ejection fraction, by estimation, is 55 to 60%. The left ventricle has normal function. The left ventricle has no regional wall motion abnormalities. There is mild left ventricular hypertrophy. Indeterminate diastolic filling due to E-A fusion. 2. Right ventricular systolic function was not well visualized. The right ventricular size is not well visualized. 3. Left atrial size was mildly dilated. 4. Right atrial size was mildly dilated. 5. The mitral valve is grossly normal. Trivial mitral valve regurgitation. 6. The aortic valve is tricuspid. Aortic valve regurgitation is not visualized. 7. Aortic dilatation noted. There is mild dilatation of the aortic root, measuring 41 mm. There is mild dilatation of the ascending aorta, measuring 41 mm.  Patient Profile     64 y.o. male with a hx of DM, HTN, HLD, OA, OSA on CPAP, morbid obesity, CHF listed but has not had echo before and never admitted for same, who is being seen for the evaluation of Afib at the request of Dr Kurtis Bushman.  Assessment & Plan    ATRIAL FLUTTER WITH RVR:   His rate is not controlled.  I am going to increase to 25 q6 hour beta blocker.  I will also  stop Norvasc and change to Cardizem 120 mg daily  We can increase further in the AM.  I will tentatively hold a slot for TEE/DCCV on Friday if we are able.  We do have room to move with with his meds and might be able to control the rate and cardiovert later after he is over this current infection.     HTN:  Manage in the context of treating his rate.     For questions or updates, please contact [place] Please consult [email or web address] for contact info under Cardiology/STEMI.   Signed, Minus Breeding, MD  07/31/2020, 11:53 AM

## 2020-07-31 NOTE — Progress Notes (Signed)
PROGRESS NOTE    Gerald Day  QLR:[id number] DOB: Mar 11, 1956 DOA: 07/26/2020 PCP: Biagio Borg, MD    Brief Narrative:  64 year old with past medical history significant of OSA on CPAP, morbid obesity, hypertension, HLD, diabetes, CHF, history of right lower extremity cellulitis presenting with lower extremity cellulitis.  Patient also presented with shortness of breath since 3 days, lower extremity edema.Found with cellulitis. Has been tachycardic. On 11/22 febrile again, tachy. ID was consulted.  On tele/ekg ?aflutter/afib...>cards was consulted.  11/23-Tmax 101.2, Ct leg completed. ID stopped vanco and cefepime, narrowed to cefazolin.  Assessment & Plan:   Principal Problem:   Sepsis due to cellulitis [facility]) Active Problems:   Diabetes mellitus type 2 in obese (HCC)   Hyperlipidemia   Morbid obesity (HCC)   OSA (obstructive sleep apnea)   Essential hypertension   AKI (acute kidney injury) ([place])   Stage 3a chronic kidney disease ([place])   1-Sepsis secondary to left lower extremity cellulitis: Patient presented with leukocytosis, tachycardia, tachypnea fever. Blood culture and urine culture no growth to date  Doppler negative for DVT , limited in some areas 11/23-leukocytosis improving slowly CT ruled out any abscess. Some improvement in cellulitis today according to patient. ID following, d/c'd vanco and cefepime, started narrow coverage with iv cefazolin Keep legs elevated.  Some improvement in leukocytosis.  No fever.  2-AKI on CKD stage IIIa: Present with a creatinine at 2.6, prior creatinine 1.5 Creatinine slowly improving but has stabilized since last 2 days.  Good urine output.  3.LE edema-has chronic lymphedema.  There may be some diastolic dysfunction but I believe this is his baseline.  With creatinine improving.  He received 1 Lasix dose yesterday.  I will give him another 1 of 40 mg IV x1.  4-Diabetes type 2: hb A1c 7.0 BG stable Continue to hold oral  hypoglycemic.  Continue SSI.  5-HTN; controlled. Continue with Norvasc, clonidinde  Mild hyponatremia:  Treated initially with ivf.  Resolved.  HLD; Continue with lipitor.   OSA; continue with CPAP.   Morbid Obesity:  Needs weight loss Complicates overall health/management.   PT OT on board.  Recommendation for discharging to SNF.   DVT prophylaxis: Lovenox Code Status: Full code Family Communication: None at bedside  Status is: Inpatient  Remains inpatient appropriate because:IV treatments appropriate due to intensity of illness or inability to take PO   Dispo: The patient is from: Home              Anticipated d/c is to: TBD              Anticipated d/c date is: 3 days              Patient currently is not medically stable to d/c.needs iv meds    Consultants:   ID  Procedures:  CT foot/tibia/fibula 1. Severe and diffuse skin thickening, subcutaneous soft tissue swelling/edema/fluid and what appear to be wounds along the lateral aspect of the lower extremity. No discrete fluid collection but exam limited without contrast to assess for abscess. 2. No findings suspicious for myofasciitis or pyomyositis. 3. No CT findings suspicious for septic arthritis or osteomyelitis. 4. Moderate-sized knee joint effusion. 5. Age advanced vascular calcifications.   Antimicrobials:   Vancomycin, cefepime--->>dc 11/23  Metronidazole d/c'd Cefazolin start 11/23...>  Subjective: Seen and examined.  Feels slightly better than yesterday.  No new complaint.  Tells me his leg is better than yesterday as well.   Objective: Vitals:   07/30/20 2150  07/31/20 0124 07/31/20 0637 07/31/20 0920  BP: (!) 147/75 (!) 152/90 138/89 (!) 141/84  Pulse: (!) 115 (!) 118 (!) 119 (!) 120  Resp: 20 20  20   Temp: 99.3 F (37.4 C) 99.8 F (37.7 C) 98.9 F (37.2 C) 99.6 F (37.6 C)  TempSrc: Oral Oral Oral Oral  SpO2: 98% 99% 96% 98%  Weight: (!) 183.7 kg     Height:         Intake/Output Summary (Last 24 hours) at 07/31/2020 1134 Last data filed at 07/31/2020 0844 Gross per 24 hour  Intake 1755 ml  Output 1700 ml  Net 55 ml   Filed Weights   07/28/20 2018 07/30/20 0453 07/30/20 2150  Weight: (!) 174.7 kg (!) 180 kg (!) 183.7 kg    Examination: General exam: Appears calm and comfortable, morbidly obese Respiratory system: Clear to auscultation. Respiratory effort normal. Cardiovascular system: S1 & S2 heard, RRR. No JVD, murmurs, rubs, gallops or clicks. No pedal edema. Gastrointestinal system: Abdomen is nondistended, soft and nontender. No organomegaly or masses felt. Normal bowel sounds heard. Central nervous system: Alert and oriented. No focal neurological deficits. Extremities: Symmetric 5 x 5 power.  Erythema involving left lower extremity.  Slightly warm and tender to touch.  No open sores. Skin: No rashes, lesions or ulcers.  Psychiatry: Judgement and insight appear normal. Mood & affect appropriate.      Data Reviewed: I have personally reviewed following labs and imaging studies  CBC: Recent Labs  Lab 07/26/20 1329 07/27/20 0445 07/28/20 0454 07/29/20 0310  WBC 18.4* 15.2* 19.3* 17.2*  HGB 11.7* 9.4* 9.4* 9.7*  HCT 35.9* 28.9* 29.0* 29.5*  MCV 93.2 91.5 92.1 91.3  PLT 304 205 239 010   Basic Metabolic Panel: Recent Labs  Lab 07/26/20 1329 07/26/20 1345 07/27/20 0445 07/28/20 0454 07/29/20 0310 07/30/20 0146 07/31/20 0455  NA   < >  --  134* 136 137 137 136  K   < >  --  4.2 4.0 3.9 4.5 4.2  CL   < >  --  103 105 103 104 107  CO2   < >  --  21* 19* 18* 20* 18*  GLUCOSE   < >  --  191* 170* 194* 191* 199*  BUN   < >  --  37* 39* 35* 33* 32*  CREATININE   < >  --  2.41* 2.17* 1.94* 1.84* 1.88*  CALCIUM   < >  --  8.2* 8.3* 8.3* 8.2* 8.1*  MG  --  1.7  --   --   --   --   --    < > = values in this interval not displayed.   GFR: Estimated Creatinine Clearance: 66.5 mL/min (A) (by C-G formula based on SCr of 1.88  mg/dL (H)). Liver Function Tests: Recent Labs  Lab 07/26/20 1450  AST 21  ALT 22  ALKPHOS 87  BILITOT 1.3*  PROT 6.9  ALBUMIN 2.7*   No results for input(s): LIPASE, AMYLASE in the last 168 hours. No results for input(s): AMMONIA in the last 168 hours. Coagulation Profile: Recent Labs  Lab 07/26/20 1345  INR 1.4*   Cardiac Enzymes: No results for input(s): CKTOTAL, CKMB, CKMBINDEX, TROPONINI in the last 168 hours. BNP (last 3 results) No results for input(s): PROBNP in the last 8760 hours. HbA1C: No results for input(s): HGBA1C in the last 72 hours. CBG: Recent Labs  Lab 07/30/20 0655 07/30/20 1148 07/30/20 1649 07/30/20 2232 07/31/20  3818  GLUCAP 178* 193* 176* 178* 187*   Lipid Profile: No results for input(s): CHOL, HDL, LDLCALC, TRIG, CHOLHDL, LDLDIRECT in the last 72 hours. Thyroid Function Tests: No results for input(s): TSH, T4TOTAL, FREET4, T3FREE, THYROIDAB in the last 72 hours. Anemia Panel: No results for input(s): VITAMINB12, FOLATE, FERRITIN, TIBC, IRON, RETICCTPCT in the last 72 hours. Sepsis Labs: Recent Labs  Lab 07/26/20 1450 07/26/20 1736 07/26/20 1746 07/26/20 2228  PROCALCITON  --  1.64  --   --   LATICACIDVEN 2.1*  --  1.5 1.3    Recent Results (from the past 240 hour(s))  Blood Culture (routine x 2)     Status: None   Collection Time: 07/26/20  2:50 PM   Specimen: BLOOD  Result Value Ref Range Status   Specimen Description BLOOD RIGHT ANTECUBITAL  Final   Special Requests   Final    BLOOD Blood Culture results may not be optimal due to an inadequate volume of blood received in culture bottles   Culture   Final    NO GROWTH 5 DAYS Performed at [facility], [place] [address] [place], [place] [address]    Report Status 07/31/2020 FINAL  Final  Blood Culture (routine x 2)     Status: None   Collection Time: 07/26/20  3:46 PM   Specimen: BLOOD  Result Value Ref Range Status   Specimen Description BLOOD SITE NOT SPECIFIED   Final   Special Requests   Final    BOTTLES DRAWN AEROBIC AND ANAEROBIC Blood Culture adequate volume   Culture   Final    NO GROWTH 5 DAYS Performed at [facility], [place] [address] [place], [place] [address]    Report Status 07/31/2020 FINAL  Final  Respiratory Panel by RT PCR (Flu A&B, Covid) - Nasopharyngeal Swab     Status: None   Collection Time: 07/26/20  5:54 PM   Specimen: Nasopharyngeal Swab; Nasopharyngeal(NP) swabs in vial transport medium  Result Value Ref Range Status   SARS Coronavirus 2 by RT PCR NEGATIVE NEGATIVE Final    Comment: (NOTE) SARS-CoV-2 target nucleic acids are NOT DETECTED.  The SARS-CoV-2 RNA is generally detectable in upper respiratoy specimens during the acute phase of infection. The lowest concentration of SARS-CoV-2 viral copies this assay can detect is 131 copies/mL. A negative result does not preclude SARS-Cov-2 infection and should not be used as the sole basis for treatment or other patient management decisions. A negative result may occur with  improper specimen collection/handling, submission of specimen other than nasopharyngeal swab, presence of viral mutation(s) within the areas targeted by this assay, and inadequate number of viral copies (<131 copies/mL). A negative result must be combined with clinical observations, patient history, and epidemiological information. The expected result is Negative.  Fact Sheet for Patients:  PinkCheek.be  Fact Sheet for Healthcare Providers:  GravelBags.it  This test is no t yet approved or cleared by the [place] FDA and  has been authorized for detection and/or diagnosis of SARS-CoV-2 by FDA under an Emergency Use Authorization (EUA). This EUA will remain  in effect (meaning this test can be used) for the duration of the COVID-19 declaration under Section 564(b)(1) of the Act, 21 U.S.C. section 360bbb-3(b)(1), unless the  authorization is terminated or revoked sooner.     Influenza A by PCR NEGATIVE NEGATIVE Final   Influenza B by PCR NEGATIVE NEGATIVE Final    Comment: (NOTE) The Xpert Xpress SARS-CoV-2/FLU/RSV assay is intended as  an aid in  the diagnosis of influenza from Nasopharyngeal swab specimens and  should not be used as a sole basis for treatment. Nasal washings and  aspirates are unacceptable for Xpert Xpress SARS-CoV-2/FLU/RSV  testing.  Fact Sheet for Patients: PinkCheek.be  Fact Sheet for Healthcare Providers: GravelBags.it  This test is not yet approved or cleared by the [place] FDA and  has been authorized for detection and/or diagnosis of SARS-CoV-2 by  FDA under an Emergency Use Authorization (EUA). This EUA will remain  in effect (meaning this test can be used) for the duration of the  Covid-19 declaration under Section 564(b)(1) of the Act, 21  U.S.C. section 360bbb-3(b)(1), unless the authorization is  terminated or revoked. Performed at [facility], [place] [address] [place], [place] [address]   Urine Culture     Status: None   Collection Time: 07/27/20  1:45 PM   Specimen: Urine, Random  Result Value Ref Range Status   Specimen Description URINE, RANDOM  Final   Special Requests NONE  Final   Culture   Final    NO GROWTH Performed at [facility], [place] [address] [place], [place] [address]    Report Status 07/28/2020 FINAL  Final  Culture, blood (routine x 2)     Status: None (Preliminary result)   Collection Time: 07/29/20  5:52 PM   Specimen: BLOOD  Result Value Ref Range Status   Specimen Description BLOOD LEFT ANTECUBITAL  Final   Special Requests   Final    BOTTLES DRAWN AEROBIC AND ANAEROBIC Blood Culture results may not be optimal due to an excessive volume of blood received in culture bottles   Culture   Final    NO GROWTH 2 DAYS Performed at [facility], [place] [address], [place], [place] [address]    Report Status PENDING  Incomplete  Culture, blood (routine x 2)     Status: None (Preliminary result)   Collection Time: 07/29/20  5:57 PM   Specimen: BLOOD  Result Value Ref Range Status   Specimen Description BLOOD RIGHT ANTECUBITAL  Final   Special Requests   Final    BOTTLES DRAWN AEROBIC AND ANAEROBIC Blood Culture results may not be optimal due to an excessive volume of blood received in culture bottles   Culture   Final    NO GROWTH 2 DAYS Performed at [facility], [place] [address] [place],  [address]    Report Status PENDING  Incomplete  Urine Culture     Status: None   Collection Time: 07/30/20 12:22 PM   Specimen: Urine, Random  Result Value Ref Range Status   Specimen Description URINE, RANDOM  Final   Special Requests NONE  Final   Culture   Final    NO GROWTH Performed at [facility], [place] [address] [place],  [address]    Report Status 07/31/2020 FINAL  Final         Radiology Studies: CT TIBIA FIBULA LEFT WO CONTRAST  Result Date: 07/30/2020 CLINICAL DATA:  Left lower extremity pain and swelling.  Sepsis. EXAM: CT OF THE LOWER LEFT EXTREMITY WITHOUT CONTRAST TECHNIQUE: Multidetector CT imaging of the lower left extremity was performed according to the standard protocol. COMPARISON:  None. FINDINGS: Severe and diffuse skin thickening, subcutaneous soft tissue swelling/edema/fluid and what appear to be wounds along the lateral aspect of the lower extremity. I do not see a discrete fluid collection but exam limited  without contrast to assess for abscess. No findings suspicious for myofasciitis or pyomyositis. Moderate knee and ankle joint degenerative changes but no destructive bony changes to suggest septic arthritis. There is a moderate-sized knee joint effusion noted. No destructive bony changes to suggest osteomyelitis. There is a bony exostosis projecting off the posteromedial aspect of the tibia which is  likely due to a fascial attachment site with calcification. Age advanced vascular calcifications. IMPRESSION: 1. Severe and diffuse skin thickening, subcutaneous soft tissue swelling/edema/fluid and what appear to be wounds along the lateral aspect of the lower extremity. No discrete fluid collection but exam limited without contrast to assess for abscess. 2. No findings suspicious for myofasciitis or pyomyositis. 3. No CT findings suspicious for septic arthritis or osteomyelitis. 4. Moderate-sized knee joint effusion. 5. Age advanced vascular calcifications. Electronically Signed   By: Marijo Sanes M.D.   On: 07/30/2020 07:49   CT FOOT LEFT WO CONTRAST  Result Date: 07/30/2020 CLINICAL DATA:  Left foot pain and swelling. EXAM: CT OF THE LEFT FOOT WITHOUT CONTRAST TECHNIQUE: Multidetector CT imaging of the left foot was performed according to the standard protocol. Multiplanar CT image reconstructions were also generated. COMPARISON:  None. FINDINGS: Diffuse and marked subcutaneous soft tissue swelling/edema/fluid most notable along the dorsum of the foot. No discrete fluid collection to suggest a drainable abscess but exam limited without contrast. No findings suspicious for myofasciitis or pyomyositis. Degenerative changes involving the ankle and foot but no CT findings to suggest septic arthritis or osteomyelitis. Age advanced vascular calcifications. IMPRESSION: 1. Diffuse and marked subcutaneous soft tissue swelling/edema/fluid most notable along the dorsum of the foot. No discrete fluid collection to suggest a drainable abscess but exam limited without contrast. 2. No findings suspicious for myofasciitis or pyomyositis. 3. Degenerative changes involving the ankle and foot but no CT findings to suggest septic arthritis or osteomyelitis. 4. Age advanced vascular calcifications. Electronically Signed   By: Marijo Sanes M.D.   On: 07/30/2020 07:52   DG CHEST PORT 1 VIEW  Result Date:  07/29/2020 CLINICAL DATA:  64 year old male with fever. EXAM: PORTABLE CHEST 1 VIEW COMPARISON:  Chest radiograph dated 07/26/2020. FINDINGS: No focal consolidation, pleural effusion or pneumothorax. The left costophrenic angle has been excluded from the image and not evaluated. There is stable cardiomegaly. Mild bilateral hilar prominence suggestive of pulmonary hypertension. No acute osseous pathology. IMPRESSION: No acute cardiopulmonary process.  No interval change. Electronically Signed   By: Anner Crete M.D.   On: 07/29/2020 20:18   ECHOCARDIOGRAM COMPLETE  Result Date: 07/29/2020    ECHOCARDIOGRAM REPORT   [name] Date of Exam: 07/29/2020 Medical Rec #:  [id number]        Height:       72.0 in Accession #:    [id number]       Weight:       385.1 lb Date of Birth:  September 14, 1955        BSA:          2.815 m Patient Age:    12 years         BP:           144/93 mmHg Patient Gender: M                HR:           120 bpm. Exam Location:  Inpatient Procedure: 2D Echo and Intracardiac Opacification Agent Indications:    Abnormal ECG  History:  Patient has prior history of Echocardiogram examinations. CHF;                 Risk Factors:Hypertension, Dyslipidemia and Diabetes.  Sonographer:    Mikki Santee RDCS (AE) Referring Phys: [id number] [place]  1. Left ventricular ejection fraction, by estimation, is 55 to 60%. The left ventricle has normal function. The left ventricle has no regional wall motion abnormalities. There is mild left ventricular hypertrophy. Indeterminate diastolic filling due to E-A fusion.  2. Right ventricular systolic function was not well visualized. The right ventricular size is not well visualized.  3. Left atrial size was mildly dilated.  4. Right atrial size was mildly dilated.  5. The mitral valve is grossly normal. Trivial mitral valve regurgitation.  6. The aortic valve is tricuspid. Aortic valve regurgitation is not visualized.   7. Aortic dilatation noted. There is mild dilatation of the aortic root, measuring 41 mm. There is mild dilatation of the ascending aorta, measuring 41 mm. Comparison(s): No prior Echocardiogram. FINDINGS  Left Ventricle: Left ventricular ejection fraction, by estimation, is 55 to 60%. The left ventricle has normal function. The left ventricle has no regional wall motion abnormalities. Definity contrast agent was given IV to delineate the left ventricular  endocardial borders. The left ventricular internal cavity size was normal in size. There is mild left ventricular hypertrophy. Indeterminate diastolic filling due to E-A fusion. Right Ventricle: The right ventricular size is not well visualized. Right vetricular wall thickness was not well visualized. Right ventricular systolic function was not well visualized. Left Atrium: Left atrial size was mildly dilated. Right Atrium: Right atrial size was mildly dilated. Pericardium: There is no evidence of pericardial effusion. Mitral Valve: The mitral valve is grossly normal. Trivial mitral valve regurgitation. Tricuspid Valve: The tricuspid valve is grossly normal. Tricuspid valve regurgitation is trivial. Aortic Valve: The aortic valve is tricuspid. Aortic valve regurgitation is not visualized. Pulmonic Valve: The pulmonic valve was normal in structure. Pulmonic valve regurgitation is not visualized. Aorta: Aortic dilatation noted. There is mild dilatation of the aortic root, measuring 41 mm. There is mild dilatation of the ascending aorta, measuring 41 mm. IAS/Shunts: No atrial level shunt detected by color flow Doppler.  LEFT VENTRICLE PLAX 2D LVIDd:         6.60 cm LVIDs:         4.90 cm LV PW:         1.20 cm LV IVS:        1.10 cm LVOT diam:     2.60 cm LV SV:         95 LV SV Index:   34 LVOT Area:     5.31 cm  RIGHT VENTRICLE TAPSE (M-mode): 1.3 cm LEFT ATRIUM              Index       RIGHT ATRIUM           Index LA diam:        4.90 cm  1.74 cm/m  RA Area:      29.20 cm LA Vol (A2C):   76.5 ml  27.18 ml/m RA Volume:   96.20 ml  34.18 ml/m LA Vol (A4C):   102.0 ml 36.24 ml/m LA Biplane Vol: 92.1 ml  32.72 ml/m  AORTIC VALVE LVOT Vmax:   94.20 cm/s LVOT Vmean:  69.100 cm/s LVOT VTI:    0.178 m  AORTA Ao Root diam: 4.10 cm  SHUNTS Systemic VTI:  0.18 m  Systemic Diam: 2.60 cm Lyman Bishop MD Electronically signed by Lyman Bishop MD Signature Date/Time: 07/29/2020/12:52:55 PM    Final         Scheduled Meds: . amLODipine  10 mg Oral Daily  . atorvastatin  10 mg Oral Daily  . cloNIDine  0.3 mg Oral TID  . docusate sodium  100 mg Oral BID  . enoxaparin (LOVENOX) injection  85 mg Subcutaneous Q24H  . insulin aspart  0-15 Units Subcutaneous TID WC  . insulin aspart  0-5 Units Subcutaneous QHS  . insulin glargine  10 Units Subcutaneous Daily  . metoprolol succinate  25 mg Oral BID  . sodium chloride flush  3 mL Intravenous Q12H   Continuous Infusions: .  ceFAZolin (ANCEF) IV 2 g (07/31/20 0506)     LOS: 5 days   Time spent: 40 minutes with more than 50% on COC  Darliss Cheney, MD [facility] Pager [phone]  If 7PM-7AM, please contact night-coverage [email or web address] 07/31/2020, 11:34 AM

## 2020-07-31 NOTE — Progress Notes (Signed)
    Pt tentatively on the schedule for TEE/DCCV 08/02/20 at 1pm however cardiology team will evaluate that morning for final recommendations. Orders have not yet been placed.   Kathyrn Drown NP-C [place] Pager: [phone]

## 2020-07-31 NOTE — Plan of Care (Signed)
  Problem: Clinical Measurements: Goal: Ability to maintain clinical measurements within normal limits will improve Outcome: Progressing   Problem: Clinical Measurements: Goal: Will remain free from infection Outcome: Progressing   

## 2020-07-31 NOTE — Evaluation (Signed)
Occupational Therapy Evaluation Patient Details Name: Gerald Day MRN: [id number] DOB: [birth year] Today's Date: 07/31/2020    History of Present Illness 64 year old with past medical history significant of OSA on CPAP, morbid obesity, hypertension, HLD, diabetes, CHF, history of right lower extremity cellulitis presenting with lower extremity cellulitis.Patient also presented with shortness of breath since 3 days, lower extremity edema.Found with cellulitis.64 year old with past medical history significant of OSA on CPAP, morbid obesity, hypertension, HLD, diabetes, CHF, history of right lower extremity cellulitis presenting with lower extremity cellulitis.Patient also presented with shortness of breath since 3 days, lower extremity edema.Found with cellulitis.   Clinical Impression   Pt. Was evaluated to assess for OT needs. Patient was living by himself at I level. Pt. States he only with have prn assist at home. Currently patient is limited by severe pain 8/10 in B LE and cellulitis in B LE. Pt. Is not able to lift and move his legs in bed and needs assist. Patient severe pain in legs limits patient willingness to attempt sitting up or getting OOB. Pt. Is requiring  ADLs in supine currently. Pt. Is not able to access his home or take care of himself at current level. Pt. Is in agreement that he will need SNF for placement unless his mobility and ADLs status greatly improves. Acute OT to follow.     Follow Up Recommendations  SNF (Pt. is willing to go to SNF for rehab.)    Equipment Recommendations  None recommended by OT (SNF can order what is needed. )    Recommendations for Other Services       Precautions / Restrictions Precautions Precautions: Fall Precaution Comments: on cpap Restrictions Weight Bearing Restrictions: No      Mobility Bed Mobility               General bed mobility comments: Pt. refused to attempt secondary to pain and cellutlitis in legs.      Transfers                 General transfer comment: refused to attempt.    Balance                                           ADL either performed or assessed with clinical judgement   ADL Overall ADL's : Needs assistance/impaired Eating/Feeding: Independent   Grooming: Wash/dry hands;Wash/dry face;Set up;Supervision/safety;Bed level   Upper Body Bathing: Minimal assistance;Bed level   Lower Body Bathing: Total assistance;Bed level;+2 for physical assistance   Upper Body Dressing : Minimal assistance;Bed level   Lower Body Dressing: Total assistance;Bed level;+2 for physical assistance   Toilet Transfer:  (unable)           Functional mobility during ADLs:  (Pt. refused to sit EOB or to roll in bed ) General ADL Comments: Pt. was cooperative with OT evaluation bed level. Pt. is very limted with pain and swelling in legs and refuses to move them.      Vision Baseline Vision/History: Wears glasses Wears Glasses: Reading only Patient Visual Report: No change from baseline Vision Assessment?: No apparent visual deficits     Perception     Praxis      Pertinent Vitals/Pain Pain Assessment: 0-10 Pain Score: 8  Pain Location: b knees Pain Descriptors / Indicators: Throbbing Pain Intervention(s): Premedicated before session     Hand Dominance Left  Extremity/Trunk Assessment Upper Extremity Assessment Upper Extremity Assessment: Overall WFL for tasks assessed   Lower Extremity Assessment Lower Extremity Assessment: Defer to PT evaluation       Communication Communication Communication: No difficulties   Cognition Arousal/Alertness: Awake/alert Behavior During Therapy: WFL for tasks assessed/performed Overall Cognitive Status: Within Functional Limits for tasks assessed                                     General Comments       Exercises     Shoulder Instructions      Home Living Family/patient expects  to be discharged to:: Private residence Living Arrangements: Alone Available Help at Discharge: Family;Available PRN/intermittently Type of Home: House Home Access: Stairs to enter [employer] of Steps: 6 Entrance Stairs-Rails: Can reach both Home Layout: Able to live on main level with bedroom/bathroom     Bathroom Shower/Tub: Occupational psychologist: Handicapped height     Home Equipment: Environmental consultant - 2 wheels;Crutches   Additional Comments: Pt. was not using any dme.      Prior Functioning/Environment Level of Independence: Independent                 OT Problem List: Decreased activity tolerance;Decreased knowledge of use of DME or AE;Obesity;Pain      OT Treatment/Interventions: Self-care/ADL training;DME and/or AE instruction;Therapeutic activities;Patient/family education    OT Goals(Current goals can be found in the care plan section) Acute Rehab OT Goals Patient Stated Goal: to have decreased pain and be able to move legs OT Goal Formulation: With patient Time For Goal Achievement: 08/14/20 Potential to Achieve Goals: Good ADL Goals Pt Will Perform Grooming: with modified independence;standing Pt Will Perform Upper Body Bathing: with modified independence;sitting Pt Will Perform Lower Body Bathing: with modified independence;with adaptive equipment;sit to/from stand Pt Will Perform Upper Body Dressing: with modified independence;sitting Pt Will Perform Lower Body Dressing: with modified independence;with adaptive equipment;sit to/from stand Pt Will Transfer to Toilet: with modified independence;ambulating Pt Will Perform Toileting - Clothing Manipulation and hygiene: with modified independence;sit to/from stand  OT Frequency: Min 2X/week   Barriers to D/C: Decreased caregiver support  Pt. does not have 24/7 care. Pt has 6 steps to get into home.        Co-evaluation              AM-PAC OT "6 Clicks" Daily Activity     Outcome  Measure Help from another person eating meals?: None Help from another person taking care of personal grooming?: A Little Help from another person toileting, which includes using toliet, bedpan, or urinal?: Total Help from another person bathing (including washing, rinsing, drying)?: A Lot Help from another person to put on and taking off regular upper body clothing?: A Little Help from another person to put on and taking off regular lower body clothing?: Total 6 Click Score: 14   End of Session Nurse Communication:  (ok therapy)  Activity Tolerance: Patient limited by pain Patient left: in bed;with call bell/phone within reach;with bed alarm set  OT Visit Diagnosis: Other abnormalities of gait and mobility (R26.89);Pain Pain - Right/Left: Left Pain - part of body: Knee                Time: 0922-0950 OT Time Calculation (min): 28 min Charges:  OT General Charges $OT Visit: 1 Visit OT Evaluation $OT Eval Moderate Complexity: 1 Mod  Reece Packer OT/L   Cassady Turano 07/31/2020, 10:03 AM

## 2020-08-01 DIAGNOSIS — I484 Atypical atrial flutter: Secondary | ICD-10-CM

## 2020-08-01 DIAGNOSIS — E1169 Type 2 diabetes mellitus with other specified complication: Secondary | ICD-10-CM

## 2020-08-01 DIAGNOSIS — E669 Obesity, unspecified: Secondary | ICD-10-CM

## 2020-08-01 DIAGNOSIS — I10 Essential (primary) hypertension: Secondary | ICD-10-CM | POA: Diagnosis not present

## 2020-08-01 DIAGNOSIS — L0390 Cellulitis, unspecified: Secondary | ICD-10-CM | POA: Diagnosis not present

## 2020-08-01 LAB — CBC WITH DIFFERENTIAL/PLATELET
Abs Immature Granulocytes: 0 10*3/uL (ref 0.00–0.07)
Basophils Absolute: 0.2 10*3/uL — ABNORMAL HIGH (ref 0.0–0.1)
Basophils Relative: 1 %
Eosinophils Absolute: 0.7 10*3/uL — ABNORMAL HIGH (ref 0.0–0.5)
Eosinophils Relative: 4 %
HCT: 28.7 % — ABNORMAL LOW (ref 39.0–52.0)
Hemoglobin: 9.2 g/dL — ABNORMAL LOW (ref 13.0–17.0)
Lymphocytes Relative: 4 %
Lymphs Abs: 0.7 10*3/uL (ref 0.7–4.0)
MCH: 29.9 pg (ref 26.0–34.0)
MCHC: 32.1 g/dL (ref 30.0–36.0)
MCV: 93.2 fL (ref 80.0–100.0)
Monocytes Absolute: 1.8 10*3/uL — ABNORMAL HIGH (ref 0.1–1.0)
Monocytes Relative: 10 %
Neutro Abs: 14.2 10*3/uL — ABNORMAL HIGH (ref 1.7–7.7)
Neutrophils Relative %: 81 %
Platelets: 343 10*3/uL (ref 150–400)
RBC: 3.08 MIL/uL — ABNORMAL LOW (ref 4.22–5.81)
RDW: 15 % (ref 11.5–15.5)
WBC: 17.5 10*3/uL — ABNORMAL HIGH (ref 4.0–10.5)
nRBC: 0 % (ref 0.0–0.2)
nRBC: 0 /100 WBC

## 2020-08-01 LAB — GLUCOSE, CAPILLARY
Glucose-Capillary: 177 mg/dL — ABNORMAL HIGH (ref 70–99)
Glucose-Capillary: 202 mg/dL — ABNORMAL HIGH (ref 70–99)
Glucose-Capillary: 246 mg/dL — ABNORMAL HIGH (ref 70–99)
Glucose-Capillary: 350 mg/dL — ABNORMAL HIGH (ref 70–99)

## 2020-08-01 LAB — BASIC METABOLIC PANEL
Anion gap: 11 (ref 5–15)
BUN: 32 mg/dL — ABNORMAL HIGH (ref 8–23)
CO2: 21 mmol/L — ABNORMAL LOW (ref 22–32)
Calcium: 8 mg/dL — ABNORMAL LOW (ref 8.9–10.3)
Chloride: 104 mmol/L (ref 98–111)
Creatinine, Ser: 1.93 mg/dL — ABNORMAL HIGH (ref 0.61–1.24)
GFR, Estimated: 38 mL/min — ABNORMAL LOW (ref >60)
Glucose, Bld: 193 mg/dL — ABNORMAL HIGH (ref 70–99)
Potassium: 4.3 mmol/L (ref 3.5–5.1)
Sodium: 136 mmol/L (ref 135–145)

## 2020-08-01 LAB — URIC ACID: Uric Acid, Serum: 10.4 mg/dL — ABNORMAL HIGH (ref 3.7–8.6)

## 2020-08-01 MED ORDER — PREDNISONE 20 MG PO TABS
30.0000 mg | ORAL_TABLET | Freq: Once | ORAL | Status: AC
  Administered 2020-08-01: 30 mg via ORAL
  Filled 2020-08-01: qty 1

## 2020-08-01 MED ORDER — POLYETHYLENE GLYCOL 3350 17 G PO PACK
17.0000 g | PACK | Freq: Every day | ORAL | Status: DC
  Administered 2020-08-01 – 2020-08-08 (×5): 17 g via ORAL
  Filled 2020-08-01 (×9): qty 1

## 2020-08-01 MED ORDER — COLCHICINE 0.6 MG PO TABS
0.6000 mg | ORAL_TABLET | Freq: Two times a day (BID) | ORAL | Status: AC
  Administered 2020-08-01 (×2): 0.6 mg via ORAL
  Filled 2020-08-01 (×2): qty 1

## 2020-08-01 MED ORDER — SENNOSIDES-DOCUSATE SODIUM 8.6-50 MG PO TABS
1.0000 | ORAL_TABLET | Freq: Two times a day (BID) | ORAL | Status: DC
  Administered 2020-08-01 – 2020-08-09 (×15): 1 via ORAL
  Filled 2020-08-01 (×18): qty 1

## 2020-08-01 MED ORDER — DILTIAZEM HCL ER COATED BEADS 180 MG PO CP24
180.0000 mg | ORAL_CAPSULE | Freq: Every day | ORAL | Status: DC
  Administered 2020-08-01 – 2020-08-09 (×9): 180 mg via ORAL
  Filled 2020-08-01 (×9): qty 1

## 2020-08-01 MED ORDER — CLONIDINE HCL 0.2 MG PO TABS
0.2000 mg | ORAL_TABLET | Freq: Three times a day (TID) | ORAL | Status: DC
  Administered 2020-08-01 – 2020-08-02 (×4): 0.2 mg via ORAL
  Filled 2020-08-01 (×4): qty 1

## 2020-08-01 NOTE — TOC Initial Note (Signed)
Transition of Care [facility]) - Initial/Assessment Note    Patient Details  Name: Gerald Day MRN: [id number] Date of Birth: December 18, 1955  [facility] CM/SW Contact:    Coralee Pesa, [place] Phone Number: 08/01/2020, 12:23 PM  Clinical Narrative:                 CSW met with pt to discuss discharge planning. SNF recommendation was explained. Pt asked about HH vs. SNF, but ultimately agreeable to SNF. Pt had no preferences for SNF placement. He has had both pfizer vaccines. He indicated that he would contact his sons to discuss recommendation, but son, Corene Cornea came to visit during discussion. Questions were answered and pt asked that his information be updated to include his sons Corene Cornea and Juliane Lack as well, so they can receive updates. Their information was entered on the Facesheet. CSW will complete workup and faxout, SW will continue to follow.  Expected Discharge Plan: Skilled Nursing Facility Barriers to Discharge: Continued Medical Work up   Patient Goals and CMS Choice Patient states their goals for this hospitalization and ongoing recovery are:: Pt and family agreeable to SNF placement.      Expected Discharge Plan and Services Expected Discharge Plan: [place] Choice: [place] arrangements for the past 2 months: Single Family Home                                      Prior Living Arrangements/Services Living arrangements for the past 2 months: Single Family Home Lives with:: Self Patient language and need for interpreter reviewed:: Yes Do you feel safe going back to the place where you live?: Yes      Need for Family Participation in Patient Care: No (Comment) Care giver support system in place?: Yes (comment)   Criminal Activity/Legal Involvement Pertinent to Current Situation/Hospitalization: No - Comment as needed  Activities of Daily Living Home Assistive Devices/Equipment: None ADL  Screening (condition at time of admission) Patient's cognitive ability adequate to safely complete daily activities?: Yes Is the patient deaf or have difficulty hearing?: No Does the patient have difficulty seeing, even when wearing glasses/contacts?: No Does the patient have difficulty concentrating, remembering, or making decisions?: No Patient able to express need for assistance with ADLs?: No Does the patient have difficulty dressing or bathing?: No Independently performs ADLs?: Yes (appropriate for developmental age) Does the patient have difficulty walking or climbing stairs?: No Weakness of Legs: Both Weakness of Arms/Hands: Both  Permission Sought/Granted Permission sought to share information with : Family Supports Permission granted to share information with : Yes, Verbal Permission Granted  Share Information with NAME: Davell Beckstead     Permission granted to share info w Relationship: Son  Permission granted to share info w Contact Information: [phone]  Emotional Assessment Appearance:: Appears stated age Attitude/Demeanor/Rapport: Engaged Affect (typically observed): Pleasant Orientation: : Oriented to Self, Oriented to Place, Oriented to  Time, Oriented to Situation Alcohol / Substance Use: Not Applicable Psych Involvement: No (comment)  Admission diagnosis:  Cellulitis of left lower extremity [L03.116] AKI (acute kidney injury) ([place]) [N17.9] Sepsis due to cellulitis ([place]) [L03.90, A41.9] Patient Active Problem List   Diagnosis Date Noted  . Sepsis due to cellulitis ([place]) 07/26/2020  . Stage 3a chronic kidney disease ([place]) 07/04/2020  . Vitamin D deficiency 12/28/2019  . B12 deficiency 12/28/2019  .  Glaucoma 12/22/2018  . Radiculopathy, lumbar region 01/17/2018  . Constipation 06/18/2017  . AKI (acute kidney injury) ([place]) 06/02/2017  . Chronic midline low back pain without sciatica 05/14/2017  . Proteinuria 11/22/2016  . Skin lesion 11/12/2015  . Preop  exam for internal medicine 01/16/2015  . Cellulitis and abscess of leg 10/26/2013  . DM foot ulcer ([place]) 10/23/2013  . Encounter for well adult exam with abnormal findings 07/05/2011  . History of colonic polyps 09/05/2009  . COLONIC POLYPS 03/14/2008  . DIVERTICULOSIS, COLON 03/14/2008  . Hyperlipidemia 06/27/2007  . CELLULITIS/ABSCESS, LEG 06/27/2007  . Diabetes mellitus type 2 in obese ([place]) 05/21/2007  . GOUT NOS 05/21/2007  . Morbid obesity ([place]) 05/21/2007  . ERECTILE DYSFUNCTION 05/21/2007  . OSA (obstructive sleep apnea) 05/21/2007  . Essential hypertension 05/21/2007   PCP:  Biagio Borg, MD Pharmacy:   [id number] [place] [place] [address]Phone: [phone]: [phone] [id number] [id number]: [phone]: [phone]   Social Determinants of Health (SDOH) Interventions    Readmission Risk Interventions No flowsheet data found.

## 2020-08-01 NOTE — Progress Notes (Signed)
Progress Note  Patient Name: Gerald Day Date of Encounter: 08/01/2020  Primary Cardiologist:   Minus Breeding, MD   Subjective   Appears to be in atypical flutter with variable response - HR around 100. Tentative plan for TEE/DCCV tomorrow at 1 pm. No complaints. LVEF normal on echo.  Inpatient Medications    Scheduled Meds: . atorvastatin  10 mg Oral Daily  . cloNIDine  0.3 mg Oral TID  . diltiazem  120 mg Oral Daily  . docusate sodium  100 mg Oral BID  . enoxaparin (LOVENOX) injection  85 mg Subcutaneous Q24H  . insulin aspart  0-15 Units Subcutaneous TID WC  . insulin aspart  0-5 Units Subcutaneous QHS  . insulin glargine  10 Units Subcutaneous Daily  . metoprolol tartrate  25 mg Oral Q6H  . sodium chloride flush  3 mL Intravenous Q12H   Continuous Infusions: .  ceFAZolin (ANCEF) IV 2 g (08/01/20 7510)   PRN Meds: acetaminophen **OR** acetaminophen, hydrALAZINE, ondansetron **OR** ondansetron (ZOFRAN) IV, oxyCODONE   Vital Signs    Vitals:   07/31/20 2020 07/31/20 2032 07/31/20 2032 08/01/20 0447  BP: 137/73 137/73 137/73 (!) 141/80  Pulse: (!) 108 (!) 108 (!) 115 93  Resp: 18 18 18 20   Temp: 99.5 F (37.5 C) 99.5 F (37.5 C) 99.5 F (37.5 C) 99.8 F (37.7 C)  TempSrc: Oral Oral Oral Oral  SpO2: 96% 96% 96% 95%  Weight:      Height:        Intake/Output Summary (Last 24 hours) at 08/01/2020 0758 Last data filed at 08/01/2020 2585 Gross per 24 hour  Intake 2660.07 ml  Output 1600 ml  Net 1060.07 ml   Filed Weights   07/28/20 2018 07/30/20 0453 07/30/20 2150  Weight: (!) 174.7 kg (!) 180 kg (!) 183.7 kg    Telemetry    Atrial flutter with variable conduction around 100 - Personally Reviewed  ECG    Atrial flutter (07/31/20) - personally reviewed  Physical Exam   GEN: No acute distress.   Neck: No  JVD Cardiac: IrregularRR, no murmurs, rubs, or gallops.  Respiratory: Clear  to auscultation bilaterally. GI: Soft, nontender,  non-distended  MS:   Moderate left greater than right edema; No deformity. Neuro:  Nonfocal  Psych: Normal affect   Labs    Chemistry Recent Labs  Lab 07/26/20 1450 07/27/20 0445 07/30/20 0146 07/31/20 0455 08/01/20 0145  NA  --    < > 137 136 136  K  --    < > 4.5 4.2 4.3  CL  --    < > 104 107 104  CO2  --    < > 20* 18* 21*  GLUCOSE  --    < > 191* 199* 193*  BUN  --    < > 33* 32* 32*  CREATININE  --    < > 1.84* 1.88* 1.93*  CALCIUM  --    < > 8.2* 8.1* 8.0*  PROT 6.9  --   --   --   --   ALBUMIN 2.7*  --   --   --   --   AST 21  --   --   --   --   ALT 22  --   --   --   --   ALKPHOS 87  --   --   --   --   BILITOT 1.3*  --   --   --   --  GFRNONAA  --    < > 40* 39* 38*  ANIONGAP  --    < > 13 11 11    < > = values in this interval not displayed.     Hematology Recent Labs  Lab 07/28/20 0454 07/29/20 0310 08/01/20 0145  WBC 19.3* 17.2* 17.5*  RBC 3.15* 3.23* 3.08*  HGB 9.4* 9.7* 9.2*  HCT 29.0* 29.5* 28.7*  MCV 92.1 91.3 93.2  MCH 29.8 30.0 29.9  MCHC 32.4 32.9 32.1  RDW 14.6 14.7 15.0  PLT 239 260 343    Cardiac EnzymesNo results for input(s): TROPONINI in the last 168 hours. No results for input(s): TROPIPOC in the last 168 hours.   BNP Recent Labs  Lab 07/26/20 1346 07/29/20 0310  BNP 296.5* 356.8*     DDimer No results for input(s): DDIMER in the last 168 hours.   Radiology    No results found.  Cardiac Studies   Echo:    1. Left ventricular ejection fraction, by estimation, is 55 to 60%. The left ventricle has normal function. The left ventricle has no regional wall motion abnormalities. There is mild left ventricular hypertrophy. Indeterminate diastolic filling due to E-A fusion. 2. Right ventricular systolic function was not well visualized. The right ventricular size is not well visualized. 3. Left atrial size was mildly dilated. 4. Right atrial size was mildly dilated. 5. The mitral valve is grossly normal. Trivial mitral  valve regurgitation. 6. The aortic valve is tricuspid. Aortic valve regurgitation is not visualized. 7. Aortic dilatation noted. There is mild dilatation of the aortic root, measuring 41 mm. There is mild dilatation of the ascending aorta, measuring 41 mm.  Patient Profile     64 y.o. male with a hx of DM, HTN, HLD, OA, OSA on CPAP, morbid obesity, CHF listed but has not had echo before and never admitted for same, who is being seen for the evaluation of Afib at the request of Dr Kurtis Bushman.  Assessment & Plan    ATRIAL FLUTTER WITH RVR:   His rate is variably controlled despite increase in BB to 25 q6 hours.  Switched from Norvasc to cardizem 120 mg daily  Tentatively held a slot for TEE/DCCV on Friday if we are able.  We do have room to move with with his meds and might be able to control the rate and cardiovert later after he is over this current infection.     HTN:  BP remains elevated - will further increase diltiazem today to 180 mg.  For questions or updates, please contact [place] Please consult [email or web address] for contact info under Cardiology/STEMI.   Pixie Casino, MD, [place], [place] Director of the Advanced Lipid Disorders &  Cardiovascular Risk Reduction Clinic Diplomate of the American Board of Clinical Lipidology Attending Cardiologist  Direct Dial: [phone]  Fax: [phone]  Website:  [email or web address]  Pixie Casino, MD  08/01/2020, 7:58 AM

## 2020-08-01 NOTE — Progress Notes (Signed)
[facility] for [facility]ious Disease  Date of Admission:  07/26/2020     Total days of antibiotics: 7         Current antibiotics:  Day 3 cefazolin  Reason for visit: Follow up on cellulitis   ASSESSMENT AND PLAN:   Gerald Day is a 64 y.o. male with left lower extremity cellulitis that has been slow to resolve in the setting of extensive lower extremity edema.  CT obtained 07/29/20 was limited due to lack of contrast because of his CKD but did not show a deeper space infection or abscess.    # Left lower extremity cellulitis -- continue cefazolin 2gm IV q8h -- recommend total of 2 weeks given extensive infection and slow resolution.  End of therapy = 08/08/20  -- would continue IV cefazolin while inpatient.  If discharging prior to end of therapy, okay to transition to PO Cephalexin -- encouraged him to elevate leg as much as possible -- diuresis per primary team -- glycemic control -- available as needed  Other issues: # DM, HTN, CHF, CKD, AKI    SUBJECTIVE:   Has been afebrile over the past 24 hrs.  WBC remains stable but elevated.  Notes improving edema with diuresis.  No CP, SOB.  Tolerating antibiotics.    Review of Systems: As noted above.  All other systems reviewed and are negative.   OBJECTIVE:   Allergies  Allergen Reactions  . Penicillins Other (See Comments)    Unknown childhood allergic reaction Has patient had a PCN reaction causing immediate rash, facial/tongue/throat swelling, SOB or lightheadedness with hypotension: Unknown Has patient had a PCN reaction causing severe rash involving mucus membranes or skin necrosis: Unknown Has patient had a PCN reaction that required hospitalization: No Has patient had a PCN reaction occurring within the last 10 years: No If all of the above answers are "NO", then may proceed with Cephalosporin use.   . Tizanidine Other (See Comments)    07/26/20: Pt does not recognize drug and does not remember  being allergic to it.    Blood pressure (!) 142/83, pulse 98, temperature 99.1 F (37.3 C), temperature source Oral, resp. rate 20, height 6' (1.829 m), weight (!) 183.7 kg, SpO2 95 %. Body mass index is 54.93 kg/m.  Physical Exam Constitutional:      Appearance: Normal appearance. He is obese.  HENT:     Head: Normocephalic and atraumatic.     Comments: Wearing CPAP. Eyes:     Extraocular Movements: Extraocular movements intact.     Conjunctiva/sclera: Conjunctivae normal.  Pulmonary:     Effort: Pulmonary effort is normal. No respiratory distress.  Musculoskeletal:        General: Swelling present.     Right lower leg: Edema present.     Left lower leg: Edema present.     Comments: Skin thickening of lower extremity with evidence of chronic venous stasis bilaterally.  Small scratch on left great toe with out warmth or erythema.  His edema of lower extremity improved bilaterally.  Skin:    Findings: Erythema present.  Neurological:     General: No focal deficit present.     Mental Status: He is alert and oriented to person, place, and time.  Psychiatric:        Mood and Affect: Mood normal.        Behavior: Behavior normal.      Lab Results & Microbiology Lab Results  Component Value Date  WBC 17.5 (H) 08/01/2020   HGB 9.2 (L) 08/01/2020   HCT 28.7 (L) 08/01/2020   MCV 93.2 08/01/2020   PLT 343 08/01/2020    Lab Results  Component Value Date   NA 136 08/01/2020   K 4.3 08/01/2020   CO2 21 (L) 08/01/2020   GLUCOSE 193 (H) 08/01/2020   BUN 32 (H) 08/01/2020   CREATININE 1.93 (H) 08/01/2020   CALCIUM 8.0 (L) 08/01/2020   GFRNONAA 38 (L) 08/01/2020   GFRAA >60 02/25/2018    Lab Results  Component Value Date   ALT 22 07/26/2020   AST 21 07/26/2020   ALKPHOS 87 07/26/2020   BILITOT 1.3 (H) 07/26/2020     I have reviewed the micro and lab results in Epic.  Imaging No results found.   Raynelle [place]land for Infectious Disease [facility] [phone] pager 08/01/2020, 10:22 AM

## 2020-08-01 NOTE — Progress Notes (Addendum)
PROGRESS NOTE    Gerald Day  XNT:[id number] DOB: [birth year] DOA: 07/26/2020 PCP: Biagio Borg, MD    Brief Narrative:  64/M with morbid obesity, OSA, type 2 diabetes mellitus, chronic diastolic CHF, chronic lymphedema was admitted with sepsis, left lower extremity cellulitis complicated by acute kidney injury. -In addition noted to have atrial flutter this hospitalization  Assessment & Plan:   Sepsis secondary to left lower extremity cellulitis: poa -Blood cultures are negative -Clinically improving on IV Ancef, appreciate infectious disease input -MRI/CT without evidence of deep infection, abscess -Continue leg elevation as tolerated -Ambulate, PT as tolerated -SNF recommended for rehabilitation, will ask social work to evaluate  Acute gout -Has considerable tenderness of both ankles, right MTP joint -Known history of gout, suspect acute flare, will check uric acid, prednisone x1 and colchicine   Atrial flutter with RVR -cards following, HR improving on cardizem For possible DCCV tomorrow  AKI on CKD 3 A -Creatinine was 2.6 on admission, baseline around 1.5 -Likely ATN, now down to 1.9, monitor closely, avoid nephrotoxins  Type 2 diabetes mellitus -Hemoglobin A1c is 7.0, CBGs are stable, hold oral hypoglycemics  Morbid obesity OSA -Continue CPAP, needs to lose weight  Hypertension -Continue clonidine and Norvasc   DVT prophylaxis: Lovenox Code Status: Full code Family Communication: None at bedside  Status is: Inpatient  Remains inpatient appropriate because:IV treatments appropriate due to intensity of illness    Dispo: The patient is from: Home              Anticipated d/c is to: SNF              Anticipated d/c date is: 2 to 3 days              Patient currently is not medically stable to d/c   Consultants:   ID  Procedures:  CT foot/tibia/fibula 1. Severe and diffuse skin thickening, subcutaneous soft tissue swelling/edema/fluid and what  appear to be wounds along the lateral aspect of the lower extremity. No discrete fluid collection but exam limited without contrast to assess for abscess. 2. No findings suspicious for myofasciitis or pyomyositis. 3. No CT findings suspicious for septic arthritis or osteomyelitis. 4. Moderate-sized knee joint effusion. 5. Age advanced vascular calcifications.   Antimicrobials:   Vancomycin, cefepime--->>dc 11/23  Metronidazole d/c'd Cefazolin start 11/23...>  Subjective: Seen and examined.  Feels slightly better than yesterday.  No new complaint.  Tells me his leg is better than yesterday as well.   Objective: Vitals:   07/31/20 2032 07/31/20 2032 08/01/20 0447 08/01/20 0937  BP: 137/73 137/73 (!) 141/80 (!) 142/83  Pulse: (!) 108 (!) 115 93 98  Resp: 18 18 20 20   Temp: 99.5 F (37.5 C) 99.5 F (37.5 C) 99.8 F (37.7 C) 99.1 F (37.3 C)  TempSrc: Oral Oral Oral Oral  SpO2: 96% 96% 95% 95%  Weight:      Height:        Intake/Output Summary (Last 24 hours) at 08/01/2020 1157 Last data filed at 08/01/2020 9675 Gross per 24 hour  Intake 2065.07 ml  Output 1600 ml  Net 465.07 ml   Filed Weights   07/28/20 2018 07/30/20 0453 07/30/20 2150  Weight: (!) 174.7 kg (!) 180 kg (!) 183.7 kg    Examination: General exam: Obese pleasant male laying in bed awake alert oriented x3, uncomfortable appearing HEENT: Neck obese unable to assess JVD CVS: S1-S2, irregularly irregular rhythm Lungs: Decreased breath sounds at the bases and  poor air movement Abdomen: Obese, soft, nontender, bowel sounds present Extremities: Left lower leg with improving erythema and tenderness, edema reportedly improving as well, right lower leg with 1+ edema, tenderness of both ankle joints and right MTP joint  Psychiatry:  Mood & affect appropriate.      Data Reviewed: I have personally reviewed following labs and imaging studies  CBC: Recent Labs  Lab 07/26/20 1329 07/27/20 0445  07/28/20 0454 07/29/20 0310 08/01/20 0145  WBC 18.4* 15.2* 19.3* 17.2* 17.5*  NEUTROABS  --   --   --   --  14.2*  HGB 11.7* 9.4* 9.4* 9.7* 9.2*  HCT 35.9* 28.9* 29.0* 29.5* 28.7*  MCV 93.2 91.5 92.1 91.3 93.2  PLT 304 205 239 260 161   Basic Metabolic Panel: Recent Labs  Lab 07/26/20 1345 07/27/20 0445 07/28/20 0454 07/29/20 0310 07/30/20 0146 07/31/20 0455 08/01/20 0145  NA  --    < > 136 137 137 136 136  K  --    < > 4.0 3.9 4.5 4.2 4.3  CL  --    < > 105 103 104 107 104  CO2  --    < > 19* 18* 20* 18* 21*  GLUCOSE  --    < > 170* 194* 191* 199* 193*  BUN  --    < > 39* 35* 33* 32* 32*  CREATININE  --    < > 2.17* 1.94* 1.84* 1.88* 1.93*  CALCIUM  --    < > 8.3* 8.3* 8.2* 8.1* 8.0*  MG 1.7  --   --   --   --   --   --    < > = values in this interval not displayed.   GFR: Estimated Creatinine Clearance: 64.8 mL/min (A) (by C-G formula based on SCr of 1.93 mg/dL (H)). Liver Function Tests: Recent Labs  Lab 07/26/20 1450  AST 21  ALT 22  ALKPHOS 87  BILITOT 1.3*  PROT 6.9  ALBUMIN 2.7*   No results for input(s): LIPASE, AMYLASE in the last 168 hours. No results for input(s): AMMONIA in the last 168 hours. Coagulation Profile: Recent Labs  Lab 07/26/20 1345  INR 1.4*   Cardiac Enzymes: No results for input(s): CKTOTAL, CKMB, CKMBINDEX, TROPONINI in the last 168 hours. BNP (last 3 results) No results for input(s): PROBNP in the last 8760 hours. HbA1C: No results for input(s): HGBA1C in the last 72 hours. CBG: Recent Labs  Lab 07/31/20 1145 07/31/20 1638 07/31/20 2104 08/01/20 0637 08/01/20 1135  GLUCAP 223* 198* 209* 177* 202*   Lipid Profile: No results for input(s): CHOL, HDL, LDLCALC, TRIG, CHOLHDL, LDLDIRECT in the last 72 hours. Thyroid Function Tests: No results for input(s): TSH, T4TOTAL, FREET4, T3FREE, THYROIDAB in the last 72 hours. Anemia Panel: No results for input(s): VITAMINB12, FOLATE, FERRITIN, TIBC, IRON, RETICCTPCT in the last  72 hours. Sepsis Labs: Recent Labs  Lab 07/26/20 1450 07/26/20 1736 07/26/20 1746 07/26/20 2228  PROCALCITON  --  1.64  --   --   LATICACIDVEN 2.1*  --  1.5 1.3    Recent Results (from the past 240 hour(s))  Blood Culture (routine x 2)     Status: None   Collection Time: 07/26/20  2:50 PM   Specimen: BLOOD  Result Value Ref Range Status   Specimen Description BLOOD RIGHT ANTECUBITAL  Final   Special Requests   Final    BLOOD Blood Culture results may not be optimal due to an inadequate volume  of blood received in culture bottles   Culture   Final    NO GROWTH 5 DAYS Performed at [facility], [place] [address] [place], [place] [address]    Report Status 07/31/2020 FINAL  Final  Blood Culture (routine x 2)     Status: None   Collection Time: 07/26/20  3:46 PM   Specimen: BLOOD  Result Value Ref Range Status   Specimen Description BLOOD SITE NOT SPECIFIED  Final   Special Requests   Final    BOTTLES DRAWN AEROBIC AND ANAEROBIC Blood Culture adequate volume   Culture   Final    NO GROWTH 5 DAYS Performed at [facility], [place] [address], [place], [place] [address]    Report Status 07/31/2020 FINAL  Final  Respiratory Panel by RT PCR (Flu A&B, Covid) - Nasopharyngeal Swab     Status: None   Collection Time: 07/26/20  5:54 PM   Specimen: Nasopharyngeal Swab; Nasopharyngeal(NP) swabs in vial transport medium  Result Value Ref Range Status   SARS Coronavirus 2 by RT PCR NEGATIVE NEGATIVE Final    Comment: (NOTE) SARS-CoV-2 target nucleic acids are NOT DETECTED.  The SARS-CoV-2 RNA is generally detectable in upper respiratoy specimens during the acute phase of infection. The lowest concentration of SARS-CoV-2 viral copies this assay can detect is 131 copies/mL. A negative result does not preclude SARS-Cov-2 infection and should not be used as the sole basis for treatment or other patient management decisions. A negative result may occur with  improper specimen  collection/handling, submission of specimen other than nasopharyngeal swab, presence of viral mutation(s) within the areas targeted by this assay, and inadequate number of viral copies (<131 copies/mL). A negative result must be combined with clinical observations, patient history, and epidemiological information. The expected result is Negative.  Fact Sheet for Patients:  PinkCheek.be  Fact Sheet for Healthcare Providers:  GravelBags.it  This test is no t yet approved or cleared by the [place] FDA and  has been authorized for detection and/or diagnosis of SARS-CoV-2 by FDA under an Emergency Use Authorization (EUA). This EUA will remain  in effect (meaning this test can be used) for the duration of the COVID-19 declaration under Section 564(b)(1) of the Act, 21 U.S.C. section 360bbb-3(b)(1), unless the authorization is terminated or revoked sooner.     Influenza A by PCR NEGATIVE NEGATIVE Final   Influenza B by PCR NEGATIVE NEGATIVE Final    Comment: (NOTE) The Xpert Xpress SARS-CoV-2/FLU/RSV assay is intended as an aid in  the diagnosis of influenza from Nasopharyngeal swab specimens and  should not be used as a sole basis for treatment. Nasal washings and  aspirates are unacceptable for Xpert Xpress SARS-CoV-2/FLU/RSV  testing.  Fact Sheet for Patients: PinkCheek.be  Fact Sheet for Healthcare Providers: GravelBags.it  This test is not yet approved or cleared by the [place] FDA and  has been authorized for detection and/or diagnosis of SARS-CoV-2 by  FDA under an Emergency Use Authorization (EUA). This EUA will remain  in effect (meaning this test can be used) for the duration of the  Covid-19 declaration under Section 564(b)(1) of the Act, 21  U.S.C. section 360bbb-3(b)(1), unless the authorization is  terminated or revoked. Performed at [facility], [place] [address], [place], [place] [address]   Urine Culture     Status: None   Collection Time: 07/27/20  1:45 PM   Specimen: Urine, Random  Result Value Ref Range Status  Specimen Description URINE, RANDOM  Final   Special Requests NONE  Final   Culture   Final    NO GROWTH Performed at [facility], [place] [address], [place], [place] [address]    Report Status 07/28/2020 FINAL  Final  Culture, blood (routine x 2)     Status: None (Preliminary result)   Collection Time: 07/29/20  5:52 PM   Specimen: BLOOD  Result Value Ref Range Status   Specimen Description BLOOD LEFT ANTECUBITAL  Final   Special Requests   Final    BOTTLES DRAWN AEROBIC AND ANAEROBIC Blood Culture results may not be optimal due to an excessive volume of blood received in culture bottles   Culture   Final    NO GROWTH 3 DAYS Performed at [facility], [place] [address] [place], [place] [address]    Report Status PENDING  Incomplete  Culture, blood (routine x 2)     Status: None (Preliminary result)   Collection Time: 07/29/20  5:57 PM   Specimen: BLOOD  Result Value Ref Range Status   Specimen Description BLOOD RIGHT ANTECUBITAL  Final   Special Requests   Final    BOTTLES DRAWN AEROBIC AND ANAEROBIC Blood Culture results may not be optimal due to an excessive volume of blood received in culture bottles   Culture   Final    NO GROWTH 3 DAYS Performed at [facility], [place] [address], [place], [place] [address]    Report Status PENDING  Incomplete  Urine Culture     Status: None   Collection Time: 07/30/20 12:22 PM   Specimen: Urine, Random  Result Value Ref Range Status   Specimen Description URINE, RANDOM  Final   Special Requests NONE  Final   Culture   Final    NO GROWTH Performed at [facility], [place] [address], [place], [place] [address]    Report Status 07/31/2020 FINAL  Final         Radiology Studies: No results found.      Scheduled Meds: .  atorvastatin  10 mg Oral Daily  . cloNIDine  0.2 mg Oral TID  . colchicine  0.6 mg Oral BID  . diltiazem  180 mg Oral Daily  . enoxaparin (LOVENOX) injection  85 mg Subcutaneous Q24H  . insulin aspart  0-15 Units Subcutaneous TID WC  . insulin aspart  0-5 Units Subcutaneous QHS  . insulin glargine  10 Units Subcutaneous Daily  . metoprolol tartrate  25 mg Oral Q6H  . polyethylene glycol  17 g Oral Daily  . senna-docusate  1 tablet Oral BID  . sodium chloride flush  3 mL Intravenous Q12H   Continuous Infusions: .  ceFAZolin (ANCEF) IV 2 g (08/01/20 5284)     LOS: 6 days   Time spent: 33min  Domenic Polite, MD [facility]11/25/2021, 11:57 AM

## 2020-08-01 NOTE — Plan of Care (Signed)
  Problem: Pain Managment: Goal: General experience of comfort will improve Outcome: Progressing   Problem: Skin Integrity: Goal: Skin integrity will improve Outcome: Progressing   Problem: Clinical Measurements: Goal: Ability to avoid or minimize complications of infection will improve Outcome: Progressing   Problem: Elimination: Goal: Will not experience complications related to bowel motility Outcome: Progressing

## 2020-08-01 NOTE — Anesthesia Preprocedure Evaluation (Addendum)
Anesthesia Evaluation  Patient identified by MRN, date of birth, ID band Patient awake    Reviewed: Allergy & Precautions, NPO status , Patient's Chart, lab work & pertinent test results  Airway Mallampati: II  TM Distance: >3 FB Neck ROM: Full    Dental  (+) Missing   Pulmonary sleep apnea and Continuous Positive Airway Pressure Ventilation ,    Pulmonary exam normal breath sounds clear to auscultation       Cardiovascular hypertension, Pt. on medications +CHF  + dysrhythmias Atrial Fibrillation  Rhythm:Irregular Rate:Normal  ECG: a-flutter, rate 123  ECHO: 1. Left ventricular ejection fraction, by estimation, is 55 to 60%. The left ventricle has normal function. The left ventricle has no regional wall motion abnormalities. There is mild left ventricular hypertrophy.  Indeterminate diastolic filling due to E-A fusion.  2. Right ventricular systolic function was not well visualized. The right ventricular size is not well visualized.  3. Left atrial size was mildly dilated.  4. Right atrial size was mildly dilated.  5. The mitral valve is grossly normal. Trivial mitral valve  regurgitation.  6. The aortic valve is tricuspid. Aortic valve regurgitation is not visualized.  7. Aortic dilatation noted. There is mild dilatation of the aortic root, measuring 41 mm. There is mild dilatation of the ascending aorta, measuring 41 mm.   Neuro/Psych  Headaches,  Neuromuscular disease negative psych ROS   GI/Hepatic negative GI ROS, (+)     substance abuse  ,   Endo/Other  diabetes, Oral Hypoglycemic AgentsMorbid obesity (SUPER)  Renal/GU Renal InsufficiencyRenal disease     Musculoskeletal  (+) Arthritis , LOW BACK PAIN   Abdominal (+) + obese,   Peds  Hematology  (+) anemia , HLD   Anesthesia Other Findings A-fib  Reproductive/Obstetrics                            Anesthesia Physical Anesthesia  Plan  ASA: IV  Anesthesia Plan: General   Post-op Pain Management:    Induction: Intravenous  PONV Risk Score and Plan: 2 and Propofol infusion and Treatment may vary due to age or medical condition  Airway Management Planned: Nasal Cannula  Additional Equipment:   Intra-op Plan:   Post-operative Plan:   Informed Consent: I have reviewed the patients History and Physical, chart, labs and discussed the procedure including the risks, benefits and alternatives for the proposed anesthesia with the patient or authorized representative who has indicated his/her understanding and acceptance.     Dental advisory given  Plan Discussed with: CRNA  Anesthesia Plan Comments:         Anesthesia Quick Evaluation

## 2020-08-01 NOTE — NC FL2 (Signed)
[place] LEVEL OF CARE SCREENING TOOL     IDENTIFICATION  Patient Name: Gerald Day Birthdate: 27-Sep-1955 Sex: male Admission Date (Current Location): 07/26/2020  [facility] and [place] Number:  Herbalist and Address:  [facility], [place] [address], [place], [place] [address]      Provider Number: [id number]  Attending Physician Name and Address:  Domenic Polite, MD  Relative Name and Phone Number:  Quayshawn Nin [phone]    Current Level of Care: Hospital Recommended Level of Care: [place] Prior Approval Number:    Date Approved/Denied:   PASRR Number: [id number]  Discharge Plan: SNF    Current Diagnoses: Patient Active Problem List   Diagnosis Date Noted  . Sepsis due to cellulitis ([place]) 07/26/2020  . Stage 3a chronic kidney disease ([place]) 07/04/2020  . Vitamin D deficiency 12/28/2019  . B12 deficiency 12/28/2019  . Glaucoma 12/22/2018  . Radiculopathy, lumbar region 01/17/2018  . Constipation 06/18/2017  . AKI (acute kidney injury) ([place]) 06/02/2017  . Chronic midline low back pain without sciatica 05/14/2017  . Proteinuria 11/22/2016  . Skin lesion 11/12/2015  . Preop exam for internal medicine 01/16/2015  . Cellulitis and abscess of leg 10/26/2013  . DM foot ulcer ([place]) 10/23/2013  . Encounter for well adult exam with abnormal findings 07/05/2011  . History of colonic polyps 09/05/2009  . COLONIC POLYPS 03/14/2008  . DIVERTICULOSIS, COLON 03/14/2008  . Hyperlipidemia 06/27/2007  . CELLULITIS/ABSCESS, LEG 06/27/2007  . Diabetes mellitus type 2 in obese ([place]) 05/21/2007  . GOUT NOS 05/21/2007  . Morbid obesity ([place]) 05/21/2007  . ERECTILE DYSFUNCTION 05/21/2007  . OSA (obstructive sleep apnea) 05/21/2007  . Essential hypertension 05/21/2007    Orientation RESPIRATION BLADDER Height & Weight     Self, Time, Situation, Place  Normal External catheter Weight: (!) 404 lb 15.8 oz  (183.7 kg) Height:  6' (182.9 cm)  BEHAVIORAL SYMPTOMS/MOOD NEUROLOGICAL BOWEL NUTRITION STATUS      Continent Diet (See discharge Summary)  AMBULATORY STATUS COMMUNICATION OF NEEDS Skin   Extensive Assist Verbally Other (Comment) (Left Big Toe Laceration)                       Personal Care Assistance Level of Assistance  Bathing, Feeding, Dressing Bathing Assistance: Maximum assistance Feeding assistance: Independent Dressing Assistance: Maximum assistance     Functional Limitations Info  Sight, Hearing, Speech Sight Info: Adequate Hearing Info: Adequate Speech Info: Adequate    SPECIAL CARE FACTORS FREQUENCY  PT (By licensed PT), OT (By licensed OT)     PT Frequency: 5x week OT Frequency: 5x week            Contractures Contractures Info: Not present    Additional Factors Info  Code Status, Allergies, Insulin Sliding Scale Code Status Info: Full Allergies Info: Penicillins,Tizanidine   Insulin Sliding Scale Info: Insulin Aspart (Novolog) 0-15 U 3x daily w/ meals, 0-3 U daily @ bedtime. Insulin Glargine (Lantus) 10 U daily       Current Medications (08/01/2020):  This is the current hospital active medication list Current Facility-Administered Medications  Medication Dose Route Frequency Provider Last Rate Last Admin  . acetaminophen (TYLENOL) tablet 650 mg  650 mg Oral Q6H PRN Karmen Bongo, MD   650 mg at 08/01/20 1047   Or  . acetaminophen (TYLENOL) suppository 650 mg  650 mg Rectal Q6H PRN Karmen Bongo, MD      .  atorvastatin (LIPITOR) tablet 10 mg  10 mg Oral Daily Karmen Bongo, MD   10 mg at 08/01/20 1048  . ceFAZolin (ANCEF) IVPB 2g/100 mL premix  2 g Intravenous Q8H Jule Ser N, DO 200 mL/hr at 08/01/20 1513 2 g at 08/01/20 1513  . cloNIDine (CATAPRES) tablet 0.2 mg  0.2 mg Oral TID Domenic Polite, MD   0.2 mg at 08/01/20 1047  . colchicine tablet 0.6 mg  0.6 mg Oral BID Domenic Polite, MD   0.6 mg at 08/01/20 1047  . diltiazem  (CARDIZEM CD) 24 hr capsule 180 mg  180 mg Oral Daily Pixie Casino, MD   180 mg at 08/01/20 1047  . enoxaparin (LOVENOX) injection 85 mg  85 mg Subcutaneous Q24H Karmen Bongo, MD   85 mg at 07/31/20 2123  . hydrALAZINE (APRESOLINE) injection 5 mg  5 mg Intravenous Q4H PRN Karmen Bongo, MD      . insulin aspart (novoLOG) injection 0-15 Units  0-15 Units Subcutaneous TID WC Karmen Bongo, MD   5 Units at 08/01/20 1505  . insulin aspart (novoLOG) injection 0-5 Units  0-5 Units Subcutaneous QHS Karmen Bongo, MD   2 Units at 07/31/20 2124  . insulin glargine (LANTUS) injection 10 Units  10 Units Subcutaneous Daily Darliss Cheney, MD   10 Units at 08/01/20 1102  . metoprolol tartrate (LOPRESSOR) tablet 25 mg  25 mg Oral Q6H Kathyrn Drown D, NP   25 mg at 08/01/20 7867  . ondansetron (ZOFRAN) tablet 4 mg  4 mg Oral Q6H PRN Karmen Bongo, MD       Or  . ondansetron [facility]) injection 4 mg  4 mg Intravenous Q6H PRN Karmen Bongo, MD      . oxyCODONE (Oxy IR/ROXICODONE) immediate release tablet 5 mg  5 mg Oral Q4H PRN Karmen Bongo, MD   5 mg at 07/31/20 1935  . polyethylene glycol (MIRALAX / GLYCOLAX) packet 17 g  17 g Oral Daily Domenic Polite, MD   17 g at 08/01/20 1047  . senna-docusate (Senokot-S) tablet 1 tablet  1 tablet Oral BID Domenic Polite, MD   1 tablet at 08/01/20 1047  . sodium chloride flush (NS) 0.9 % injection 3 mL  3 mL Intravenous Q12H Karmen Bongo, MD   3 mL at 08/01/20 1052     Discharge Medications: Please see discharge summary for a list of discharge medications.  Relevant Imaging Results:  Relevant Lab Results:   Additional Information SS# [place], [place]

## 2020-08-01 NOTE — H&P (View-Only) (Signed)
Progress Note  Patient Name: Gerald Day Date of Encounter: 08/01/2020  Primary Cardiologist:   Minus Breeding, MD   Subjective   Appears to be in atypical flutter with variable response - HR around 100. Tentative plan for TEE/DCCV tomorrow at 1 pm. No complaints. LVEF normal on echo.  Inpatient Medications    Scheduled Meds: . atorvastatin  10 mg Oral Daily  . cloNIDine  0.3 mg Oral TID  . diltiazem  120 mg Oral Daily  . docusate sodium  100 mg Oral BID  . enoxaparin (LOVENOX) injection  85 mg Subcutaneous Q24H  . insulin aspart  0-15 Units Subcutaneous TID WC  . insulin aspart  0-5 Units Subcutaneous QHS  . insulin glargine  10 Units Subcutaneous Daily  . metoprolol tartrate  25 mg Oral Q6H  . sodium chloride flush  3 mL Intravenous Q12H   Continuous Infusions: .  ceFAZolin (ANCEF) IV 2 g (08/01/20 9629)   PRN Meds: acetaminophen **OR** acetaminophen, hydrALAZINE, ondansetron **OR** ondansetron (ZOFRAN) IV, oxyCODONE   Vital Signs    Vitals:   07/31/20 2020 07/31/20 2032 07/31/20 2032 08/01/20 0447  BP: 137/73 137/73 137/73 (!) 141/80  Pulse: (!) 108 (!) 108 (!) 115 93  Resp: 18 18 18 20   Temp: 99.5 F (37.5 C) 99.5 F (37.5 C) 99.5 F (37.5 C) 99.8 F (37.7 C)  TempSrc: Oral Oral Oral Oral  SpO2: 96% 96% 96% 95%  Weight:      Height:        Intake/Output Summary (Last 24 hours) at 08/01/2020 0758 Last data filed at 08/01/2020 5284 Gross per 24 hour  Intake 2660.07 ml  Output 1600 ml  Net 1060.07 ml   Filed Weights   07/28/20 2018 07/30/20 0453 07/30/20 2150  Weight: (!) 174.7 kg (!) 180 kg (!) 183.7 kg    Telemetry    Atrial flutter with variable conduction around 100 - Personally Reviewed  ECG    Atrial flutter (07/31/20) - personally reviewed  Physical Exam   GEN: No acute distress.   Neck: No  JVD Cardiac: IrregularRR, no murmurs, rubs, or gallops.  Respiratory: Clear  to auscultation bilaterally. GI: Soft, nontender,  non-distended  MS:   Moderate left greater than right edema; No deformity. Neuro:  Nonfocal  Psych: Normal affect   Labs    Chemistry Recent Labs  Lab 07/26/20 1450 07/27/20 0445 07/30/20 0146 07/31/20 0455 08/01/20 0145  NA  --    < > 137 136 136  K  --    < > 4.5 4.2 4.3  CL  --    < > 104 107 104  CO2  --    < > 20* 18* 21*  GLUCOSE  --    < > 191* 199* 193*  BUN  --    < > 33* 32* 32*  CREATININE  --    < > 1.84* 1.88* 1.93*  CALCIUM  --    < > 8.2* 8.1* 8.0*  PROT 6.9  --   --   --   --   ALBUMIN 2.7*  --   --   --   --   AST 21  --   --   --   --   ALT 22  --   --   --   --   ALKPHOS 87  --   --   --   --   BILITOT 1.3*  --   --   --   --  GFRNONAA  --    < > 40* 39* 38*  ANIONGAP  --    < > 13 11 11    < > = values in this interval not displayed.     Hematology Recent Labs  Lab 07/28/20 0454 07/29/20 0310 08/01/20 0145  WBC 19.3* 17.2* 17.5*  RBC 3.15* 3.23* 3.08*  HGB 9.4* 9.7* 9.2*  HCT 29.0* 29.5* 28.7*  MCV 92.1 91.3 93.2  MCH 29.8 30.0 29.9  MCHC 32.4 32.9 32.1  RDW 14.6 14.7 15.0  PLT 239 260 343    Cardiac EnzymesNo results for input(s): TROPONINI in the last 168 hours. No results for input(s): TROPIPOC in the last 168 hours.   BNP Recent Labs  Lab 07/26/20 1346 07/29/20 0310  BNP 296.5* 356.8*     DDimer No results for input(s): DDIMER in the last 168 hours.   Radiology    No results found.  Cardiac Studies   Echo:    1. Left ventricular ejection fraction, by estimation, is 55 to 60%. The left ventricle has normal function. The left ventricle has no regional wall motion abnormalities. There is mild left ventricular hypertrophy. Indeterminate diastolic filling due to E-A fusion. 2. Right ventricular systolic function was not well visualized. The right ventricular size is not well visualized. 3. Left atrial size was mildly dilated. 4. Right atrial size was mildly dilated. 5. The mitral valve is grossly normal. Trivial mitral  valve regurgitation. 6. The aortic valve is tricuspid. Aortic valve regurgitation is not visualized. 7. Aortic dilatation noted. There is mild dilatation of the aortic root, measuring 41 mm. There is mild dilatation of the ascending aorta, measuring 41 mm.  Patient Profile     64 y.o. male with a hx of DM, HTN, HLD, OA, OSA on CPAP, morbid obesity, CHF listed but has not had echo before and never admitted for same, who is being seen for the evaluation of Afib at the request of Dr Kurtis Bushman.  Assessment & Plan    ATRIAL FLUTTER WITH RVR:   His rate is variably controlled despite increase in BB to 25 q6 hours.  Switched from Norvasc to cardizem 120 mg daily  Tentatively held a slot for TEE/DCCV on Friday if we are able.  We do have room to move with with his meds and might be able to control the rate and cardiovert later after he is over this current infection.     HTN:  BP remains elevated - will further increase diltiazem today to 180 mg.  For questions or updates, please contact [place] Please consult [email or web address] for contact info under Cardiology/STEMI.   Pixie Casino, MD, [facility], [place] Director of the [facility]  Card[facility] Diplomate of the American Board of Clinical Lipidology Attending Cardiologist  Direct Dial: [phone]  Fax: [phone]  Website:  [email or web address]  Pixie Casino, MD  08/01/2020, 7:58 AM

## 2020-08-02 ENCOUNTER — Encounter (HOSPITAL_COMMUNITY): Payer: Self-pay | Admitting: Internal Medicine

## 2020-08-02 ENCOUNTER — Inpatient Hospital Stay (HOSPITAL_COMMUNITY): Payer: Medicare HMO

## 2020-08-02 ENCOUNTER — Encounter (HOSPITAL_COMMUNITY)
Admission: EM | Disposition: A | Discharge status: Skilled Nursing Facility | Payer: Self-pay | Source: Home / Self Care | Attending: Internal Medicine

## 2020-08-02 ENCOUNTER — Inpatient Hospital Stay (HOSPITAL_COMMUNITY): Payer: Medicare HMO | Admitting: Anesthesiology

## 2020-08-02 DIAGNOSIS — A419 Sepsis, unspecified organism: Secondary | ICD-10-CM | POA: Diagnosis not present

## 2020-08-02 DIAGNOSIS — I4892 Unspecified atrial flutter: Secondary | ICD-10-CM

## 2020-08-02 DIAGNOSIS — L0390 Cellulitis, unspecified: Secondary | ICD-10-CM | POA: Diagnosis not present

## 2020-08-02 DIAGNOSIS — I480 Paroxysmal atrial fibrillation: Secondary | ICD-10-CM

## 2020-08-02 DIAGNOSIS — I484 Atypical atrial flutter: Secondary | ICD-10-CM

## 2020-08-02 DIAGNOSIS — I10 Essential (primary) hypertension: Secondary | ICD-10-CM | POA: Diagnosis not present

## 2020-08-02 DIAGNOSIS — E1169 Type 2 diabetes mellitus with other specified complication: Secondary | ICD-10-CM | POA: Diagnosis not present

## 2020-08-02 HISTORY — PX: CARDIOVERSION: SHX1299

## 2020-08-02 HISTORY — PX: TEE WITHOUT CARDIOVERSION: SHX5443

## 2020-08-02 LAB — BASIC METABOLIC PANEL
Anion gap: 11 (ref 5–15)
BUN: 40 mg/dL — ABNORMAL HIGH (ref 8–23)
CO2: 20 mmol/L — ABNORMAL LOW (ref 22–32)
Calcium: 7.8 mg/dL — ABNORMAL LOW (ref 8.9–10.3)
Chloride: 101 mmol/L (ref 98–111)
Creatinine, Ser: 1.96 mg/dL — ABNORMAL HIGH (ref 0.61–1.24)
GFR, Estimated: 37 mL/min — ABNORMAL LOW (ref >60)
Glucose, Bld: 301 mg/dL — ABNORMAL HIGH (ref 70–99)
Potassium: 4 mmol/L (ref 3.5–5.1)
Sodium: 132 mmol/L — ABNORMAL LOW (ref 135–145)

## 2020-08-02 LAB — CBC
HCT: 28.8 % — ABNORMAL LOW (ref 39.0–52.0)
Hemoglobin: 9.4 g/dL — ABNORMAL LOW (ref 13.0–17.0)
MCH: 30.2 pg (ref 26.0–34.0)
MCHC: 32.6 g/dL (ref 30.0–36.0)
MCV: 92.6 fL (ref 80.0–100.0)
Platelets: 368 10*3/uL (ref 150–400)
RBC: 3.11 MIL/uL — ABNORMAL LOW (ref 4.22–5.81)
RDW: 14.9 % (ref 11.5–15.5)
WBC: 18.7 10*3/uL — ABNORMAL HIGH (ref 4.0–10.5)
nRBC: 0 % (ref 0.0–0.2)

## 2020-08-02 LAB — GLUCOSE, CAPILLARY
Glucose-Capillary: 239 mg/dL — ABNORMAL HIGH (ref 70–99)
Glucose-Capillary: 236 mg/dL — ABNORMAL HIGH (ref 70–99)
Glucose-Capillary: 215 mg/dL — ABNORMAL HIGH (ref 70–99)
Glucose-Capillary: 340 mg/dL — ABNORMAL HIGH (ref 70–99)
Glucose-Capillary: 280 mg/dL — ABNORMAL HIGH (ref 70–99)

## 2020-08-02 SURGERY — ECHOCARDIOGRAM, TRANSESOPHAGEAL
Anesthesia: General

## 2020-08-02 MED ORDER — APIXABAN 5 MG PO TABS
5.0000 mg | ORAL_TABLET | Freq: Two times a day (BID) | ORAL | Status: DC
  Administered 2020-08-02 – 2020-08-09 (×16): 5 mg via ORAL
  Filled 2020-08-02 (×9): qty 1
  Filled 2020-08-02: qty 2
  Filled 2020-08-02 (×6): qty 1

## 2020-08-02 MED ORDER — PREDNISONE 20 MG PO TABS
20.0000 mg | ORAL_TABLET | Freq: Once | ORAL | Status: AC
  Administered 2020-08-02: 20 mg via ORAL
  Filled 2020-08-02: qty 1

## 2020-08-02 MED ORDER — CLONIDINE HCL 0.1 MG PO TABS
0.1000 mg | ORAL_TABLET | Freq: Two times a day (BID) | ORAL | Status: DC
  Administered 2020-08-02 – 2020-08-04 (×4): 0.1 mg via ORAL
  Filled 2020-08-02 (×4): qty 1

## 2020-08-02 MED ORDER — PROPOFOL 10 MG/ML IV BOLUS
INTRAVENOUS | Status: DC | PRN
  Administered 2020-08-02 (×2): 20 mg via INTRAVENOUS

## 2020-08-02 MED ORDER — INSULIN GLARGINE 100 UNIT/ML ~~LOC~~ SOLN: 20.0000 [IU] | Freq: Every day | SUBCUTANEOUS | Status: DC

## 2020-08-02 MED ORDER — LACTATED RINGERS IV SOLN: INTRAVENOUS | Status: DC | PRN

## 2020-08-02 MED ORDER — PROPOFOL 500 MG/50ML IV EMUL
INTRAVENOUS | Status: DC | PRN
Start: 2020-08-02 — End: 2020-08-02
  Administered 2020-08-02: 150 ug/kg/min via INTRAVENOUS

## 2020-08-02 MED ORDER — INSULIN GLARGINE 100 UNIT/ML ~~LOC~~ SOLN
20.0000 [IU] | Freq: Every day | SUBCUTANEOUS | Status: DC
  Administered 2020-08-02 – 2020-08-03 (×2): 20 [IU] via SUBCUTANEOUS
  Filled 2020-08-02 (×3): qty 0.2

## 2020-08-02 MED ORDER — COLCHICINE 0.6 MG PO TABS
0.6000 mg | ORAL_TABLET | Freq: Two times a day (BID) | ORAL | Status: AC
  Administered 2020-08-02 (×2): 0.6 mg via ORAL
  Filled 2020-08-02 (×2): qty 1

## 2020-08-02 MED ORDER — METOPROLOL SUCCINATE ER 100 MG PO TB24
100.0000 mg | ORAL_TABLET | Freq: Every day | ORAL | Status: DC
  Administered 2020-08-02 – 2020-08-05 (×4): 100 mg via ORAL
  Filled 2020-08-02 (×4): qty 1

## 2020-08-02 NOTE — Progress Notes (Signed)
  Echocardiogram Echocardiogram Transesophageal with color and doppler has been performed.  Darlina Sicilian M 08/02/2020, 8:57 AM

## 2020-08-02 NOTE — Op Note (Signed)
Procedure: Electrical Cardioversion Indications:  Atrial Flutter  Procedure Details:  Consent: Risks of procedure as well as the alternatives and risks of each were explained to the (patient/caregiver).  Consent for procedure obtained.  Time Out: Verified patient identification, verified procedure, site/side was marked, verified correct patient position, special equipment/implants available, medications/allergies/relevent history reviewed, required imaging and test results available.  Performed  Patient placed on cardiac monitor, pulse oximetry, supplemental oxygen as necessary.  Sedation given: IV propofol Pacer pads placed anterior and posterior chest.  Cardioverted 1 time(s).  Cardioversion with synchronized biphasic 200J shock.  Evaluation: Findings: Post procedure EKG shows: NSR with PVCs Complications: None Patient did tolerate procedure well.  Time Spent Directly with the Patient:  15 minutes   Tahiry Spicer 08/02/2020, 8:43 AM

## 2020-08-02 NOTE — Interval H&P Note (Signed)
History and Physical Interval Note:  08/02/2020 8:19 AM  Gerald Day  has presented today for surgery, with the diagnosis of Afib.  The various methods of treatment have been discussed with the patient and family. After consideration of risks, benefits and other options for treatment, the patient has consented to  Procedure(s): TRANSESOPHAGEAL ECHOCARDIOGRAM (TEE) (N/A) CARDIOVERSION (N/A) as a surgical intervention.  The patient's history has been reviewed, patient examined, no change in status, stable for surgery.  I have reviewed the patient's chart and labs.  Questions were answered to the patient's satisfaction.     Lia Vigilante

## 2020-08-02 NOTE — Progress Notes (Signed)
PROGRESS NOTE    Gerald Day  RKY:[id number] DOB: May 10, 1956 DOA: 07/26/2020 PCP: Biagio Borg, MD    Brief Narrative:  64/M with morbid obesity, OSA, type 2 diabetes mellitus, chronic diastolic CHF, chronic lymphedema was admitted with sepsis, left lower extremity cellulitis complicated by acute kidney injury. -In addition noted to have atrial flutter this hospitalization  Assessment & Plan:   Sepsis secondary to left lower extremity cellulitis -Blood cultures are negative -Clinically improving on IV Ancef, appreciate infectious disease input -MRI/CT without evidence of deep infection, abscess -Continue leg elevation as tolerated -Mild worsening of leukocytosis I suspect is secondary to steroids and stress demargination will monitor -Ambulate, PT as tolerated -SNF recommended for rehabilitation, social work consulted  Acute gout flare -Has considerable tenderness of both ankles, right MTP joint, uric acid greater than 10 -Known history of gout, acute flare suspected and started on prednisone yesterday with a dose of colchicine -Monitor  Atrial flutter with RVR -cards following,  -Remains on Cardizem, DC cardioverted back into sinus rhythm today -Continue Eliquis  AKI on CKD 3 A -Creatinine was 2.6 on admission, baseline around 1.5 -Likely ATN, now down to 1.9, monitor closely, avoid nephrotoxins  Type 2 diabetes mellitus -Hemoglobin A1c is 7.0,  -CBGs higher in the setting of prednisone, increase Lantus to 20 units  Morbid obesity OSA -Continue CPAP, needs to lose weight  Hypertension -Cut down clonidine, continue Norvasc   DVT prophylaxis: Lovenox Code Status: Full code Family Communication: None at bedside  Status is: Inpatient  Remains inpatient appropriate because:IV treatments appropriate due to intensity of illness    Dispo: The patient is from: Home              Anticipated d/c is to: SNF              Anticipated d/c date is: 2 to 3 days               Patient currently is not medically stable to d/c   Consultants:   ID  Procedures:  CT foot/tibia/fibula 1. Severe and diffuse skin thickening, subcutaneous soft tissue swelling/edema/fluid and what appear to be wounds along the lateral aspect of the lower extremity. No discrete fluid collection but exam limited without contrast to assess for abscess. 2. No findings suspicious for myofasciitis or pyomyositis. 3. No CT findings suspicious for septic arthritis or osteomyelitis. 4. Moderate-sized knee joint effusion. 5. Age advanced vascular calcifications.   Antimicrobials:   Vancomycin, cefepime--->>dc 11/23  Metronidazole d/c'd Cefazolin start 11/23...>  Subjective: Feels a little better today, pain in his legs and joints are improving, just back from cardioversion   Objective: Vitals:   08/02/20 0900 08/02/20 0910 08/02/20 0920 08/02/20 0949  BP: (!) 120/59 135/64 (!) 146/67 (!) 142/73  Pulse: 67 63 66 70  Resp: 19 19 (!) 23 19  Temp:    (!) 97.5 F (36.4 C)  TempSrc:      SpO2: 100% 97% 97% 98%  Weight:      Height:        Intake/Output Summary (Last 24 hours) at 08/02/2020 1121 Last data filed at 08/02/2020 0844 Gross per 24 hour  Intake 1520 ml  Output 1320 ml  Net 200 ml   Filed Weights   07/30/20 2150 08/01/20 2117 08/02/20 0737  Weight: (!) 183.7 kg (!) 183.7 kg (!) 183.7 kg    Examination: General exam: Pleasant obese male, laying in bed, AAOx3, no distress HEENT: Neck obese unable to assess  JVD CVS: S1-S2, regular rhythm Today Lungs: Decreased breath sounds to bases Abdomen: Obese, soft, nontender, bowel sounds present Extremities: Left lower leg with improving erythema and tenderness , trace edema right lower extremity as well, improving tenderness of both ankles and right MTP joint Psychiatry:  Mood & affect appropriate.      Data Reviewed: I have personally reviewed following labs and imaging studies  CBC: Recent Labs  Lab  07/27/20 0445 07/28/20 0454 07/29/20 0310 08/01/20 0145 08/02/20 0141  WBC 15.2* 19.3* 17.2* 17.5* 18.7*  NEUTROABS  --   --   --  14.2*  --   HGB 9.4* 9.4* 9.7* 9.2* 9.4*  HCT 28.9* 29.0* 29.5* 28.7* 28.8*  MCV 91.5 92.1 91.3 93.2 92.6  PLT 205 239 260 343 409   Basic Metabolic Panel: Recent Labs  Lab 07/26/20 1345 07/27/20 0445 07/29/20 0310 07/30/20 0146 07/31/20 0455 08/01/20 0145 08/02/20 0141  NA  --    < > 137 137 136 136 132*  K  --    < > 3.9 4.5 4.2 4.3 4.0  CL  --    < > 103 104 107 104 101  CO2  --    < > 18* 20* 18* 21* 20*  GLUCOSE  --    < > 194* 191* 199* 193* 301*  BUN  --    < > 35* 33* 32* 32* 40*  CREATININE  --    < > 1.94* 1.84* 1.88* 1.93* 1.96*  CALCIUM  --    < > 8.3* 8.2* 8.1* 8.0* 7.8*  MG 1.7  --   --   --   --   --   --    < > = values in this interval not displayed.   GFR: Estimated Creatinine Clearance: 63.8 mL/min (A) (by C-G formula based on SCr of 1.96 mg/dL (H)). Liver Function Tests: Recent Labs  Lab 07/26/20 1450  AST 21  ALT 22  ALKPHOS 87  BILITOT 1.3*  PROT 6.9  ALBUMIN 2.7*   No results for input(s): LIPASE, AMYLASE in the last 168 hours. No results for input(s): AMMONIA in the last 168 hours. Coagulation Profile: Recent Labs  Lab 07/26/20 1345  INR 1.4*   Cardiac Enzymes: No results for input(s): CKTOTAL, CKMB, CKMBINDEX, TROPONINI in the last 168 hours. BNP (last 3 results) No results for input(s): PROBNP in the last 8760 hours. HbA1C: No results for input(s): HGBA1C in the last 72 hours. CBG: Recent Labs  Lab 08/01/20 1616 08/01/20 2117 08/02/20 0648 08/02/20 0731 08/02/20 1111  GLUCAP 246* 350* 239* 236* 215*   Lipid Profile: No results for input(s): CHOL, HDL, LDLCALC, TRIG, CHOLHDL, LDLDIRECT in the last 72 hours. Thyroid Function Tests: No results for input(s): TSH, T4TOTAL, FREET4, T3FREE, THYROIDAB in the last 72 hours. Anemia Panel: No results for input(s): VITAMINB12, FOLATE, FERRITIN,  TIBC, IRON, RETICCTPCT in the last 72 hours. Sepsis Labs: Recent Labs  Lab 07/26/20 1450 07/26/20 1736 07/26/20 1746 07/26/20 2228  PROCALCITON  --  1.64  --   --   LATICACIDVEN 2.1*  --  1.5 1.3    Recent Results (from the past 240 hour(s))  Blood Culture (routine x 2)     Status: None   Collection Time: 07/26/20  2:50 PM   Specimen: BLOOD  Result Value Ref Range Status   Specimen Description BLOOD RIGHT ANTECUBITAL  Final   Special Requests   Final    BLOOD Blood Culture results may not be optimal  due to an inadequate volume of blood received in culture bottles   Culture   Final    NO GROWTH 5 DAYS Performed at [facility], [place] [address], [place], [place] [address]    Report Status 07/31/2020 FINAL  Final  Blood Culture (routine x 2)     Status: None   Collection Time: 07/26/20  3:46 PM   Specimen: BLOOD  Result Value Ref Range Status   Specimen Description BLOOD SITE NOT SPECIFIED  Final   Special Requests   Final    BOTTLES DRAWN AEROBIC AND ANAEROBIC Blood Culture adequate volume   Culture   Final    NO GROWTH 5 DAYS Performed at [facility], [place] [address], [place], [place] [address]    Report Status 07/31/2020 FINAL  Final  Respiratory Panel by RT PCR (Flu A&B, Covid) - Nasopharyngeal Swab     Status: None   Collection Time: 07/26/20  5:54 PM   Specimen: Nasopharyngeal Swab; Nasopharyngeal(NP) swabs in vial transport medium  Result Value Ref Range Status   SARS Coronavirus 2 by RT PCR NEGATIVE NEGATIVE Final    Comment: (NOTE) SARS-CoV-2 target nucleic acids are NOT DETECTED.  The SARS-CoV-2 RNA is generally detectable in upper respiratoy specimens during the acute phase of infection. The lowest concentration of SARS-CoV-2 viral copies this assay can detect is 131 copies/mL. A negative result does not preclude SARS-Cov-2 infection and should not be used as the sole basis for treatment or other patient management decisions. A negative result may  occur with  improper specimen collection/handling, submission of specimen other than nasopharyngeal swab, presence of viral mutation(s) within the areas targeted by this assay, and inadequate number of viral copies (<131 copies/mL). A negative result must be combined with clinical observations, patient history, and epidemiological information. The expected result is Negative.  Fact Sheet for Patients:  PinkCheek.be  Fact Sheet for Healthcare Providers:  GravelBags.it  This test is no t yet approved or cleared by the [place] FDA and  has been authorized for detection and/or diagnosis of SARS-CoV-2 by FDA under an Emergency Use Authorization (EUA). This EUA will remain  in effect (meaning this test can be used) for the duration of the COVID-19 declaration under Section 564(b)(1) of the Act, 21 U.S.C. section 360bbb-3(b)(1), unless the authorization is terminated or revoked sooner.     Influenza A by PCR NEGATIVE NEGATIVE Final   Influenza B by PCR NEGATIVE NEGATIVE Final    Comment: (NOTE) The Xpert Xpress SARS-CoV-2/FLU/RSV assay is intended as an aid in  the diagnosis of influenza from Nasopharyngeal swab specimens and  should not be used as a sole basis for treatment. Nasal washings and  aspirates are unacceptable for Xpert Xpress SARS-CoV-2/FLU/RSV  testing.  Fact Sheet for Patients: PinkCheek.be  Fact Sheet for Healthcare Providers: GravelBags.it  This test is not yet approved or cleared by the [place] FDA and  has been authorized for detection and/or diagnosis of SARS-CoV-2 by  FDA under an Emergency Use Authorization (EUA). This EUA will remain  in effect (meaning this test can be used) for the duration of the  Covid-19 declaration under Section 564(b)(1) of the Act, 21  U.S.C. section 360bbb-3(b)(1), unless the authorization is  terminated or  revoked. Performed at [facility], [place] [address], [place], [place] [address]   Urine Culture     Status: None   Collection Time: 07/27/20  1:45 PM   Specimen: Urine, Random  Result  Value Ref Range Status   Specimen Description URINE, RANDOM  Final   Special Requests NONE  Final   Culture   Final    NO GROWTH Performed at [facility], [address] [place], [place] [address]    Report Status 07/28/2020 FINAL  Final  Culture, blood (routine x 2)     Status: None (Preliminary result)   Collection Time: 07/29/20  5:52 PM   Specimen: BLOOD  Result Value Ref Range Status   Specimen Description BLOOD LEFT ANTECUBITAL  Final   Special Requests   Final    BOTTLES DRAWN AEROBIC AND ANAEROBIC Blood Culture results may not be optimal due to an excessive volume of blood received in culture bottles   Culture   Final    NO GROWTH 3 DAYS Performed at [facility], [place] [address], [place], [place] [address]    Report Status PENDING  Incomplete  Culture, blood (routine x 2)     Status: None (Preliminary result)   Collection Time: 07/29/20  5:57 PM   Specimen: BLOOD  Result Value Ref Range Status   Specimen Description BLOOD RIGHT ANTECUBITAL  Final   Special Requests   Final    BOTTLES DRAWN AEROBIC AND ANAEROBIC Blood Culture results may not be optimal due to an excessive volume of blood received in culture bottles   Culture   Final    NO GROWTH 3 DAYS Performed at [facility], [place] [address], [place], [place] [address]    Report Status PENDING  Incomplete  Urine Culture     Status: None   Collection Time: 07/30/20 12:22 PM   Specimen: Urine, Random  Result Value Ref Range Status   Specimen Description URINE, RANDOM  Final   Special Requests NONE  Final   Culture   Final    NO GROWTH Performed at [facility], [place] [address], [place],  [address]    Report Status 07/31/2020 FINAL  Final         Radiology Studies: ECHO TEE  Result  Date: 08/17/2020    TRANSESOPHOGEAL ECHO REPORT   [name] Date of Exam: Aug 17, 2020 Medical Rec #:  [id number]        Height:       72.0 in Accession #:    [id number]       Weight:       405.0 lb Date of Birth:  September 18, 1955        BSA:          2.876 m Patient Age:    24 years         BP:           115/48 mmHg Patient Gender: M                HR:           63 bpm. Exam Location:  Inpatient Procedure: Transesophageal Echo, Color Doppler and Cardiac Doppler Indications:     Atrial Flutter  History:         Patient has prior history of Echocardiogram examinations, most                  recent 07/29/2020. Risk Factors:Hypertension, Diabetes,                  Dyslipidemia and Sleep Apnea.  Sonographer:     Darlina Sicilian RDCS Referring Phys:  [phone] KENNETH C HILTY  Diagnosing Phys: Sanda Klein MD PROCEDURE: After discussion of the risks and benefits of a TEE, an informed consent was obtained. TEE procedure time was 29 minutes. The transesophogeal probe was passed without difficulty through the esophogus of the patient. Imaged were obtained with the patient in a supine position. Sedation performed by different physician. The patient was monitored while under deep sedation. Anesthestetic sedation was provided intravenously by Anesthesiology: 288mg  of Propofol. Image quality was good. The patient's vital signs; including heart rate, blood pressure, and oxygen saturation; remained stable throughout the procedure. The patient developed no complications during the procedure. A direct current cardioversion was performed at 200 joules with 1 attempt. IMPRESSIONS  1. Left ventricular ejection fraction, by estimation, is 50 to 55%. The left ventricle has low normal function. The left ventricle has no regional wall motion abnormalities.  2. Right ventricular systolic function is normal. The right ventricular size is normal. There is normal pulmonary artery systolic pressure.  3. No left atrial/left atrial  appendage thrombus was detected. The LAA emptying velocity was 60 cm/s.  4. The mitral valve is normal in structure. Trivial mitral valve regurgitation. No evidence of mitral stenosis.  5. The aortic valve is normal in structure. Aortic valve regurgitation is not visualized. No aortic stenosis is present.  6. The inferior vena cava is normal in size with greater than 50% respiratory variability, suggesting right atrial pressure of 3 mmHg.  7. Evidence of atrial level shunting detected by color flow Doppler. There is a very small patent foramen ovale with a tiny amount of bidirectional shunting across atrial septum. Conclusion(s)/Recommendation(s): Normal biventricular function without evidence of hemodynamically significant valvular heart disease. FINDINGS  Left Ventricle: Left ventricular ejection fraction, by estimation, is 50 to 55%. The left ventricle has low normal function. The left ventricle has no regional wall motion abnormalities. The left ventricular internal cavity size was normal in size. There is no left ventricular hypertrophy. Right Ventricle: The right ventricular size is normal. No increase in right ventricular wall thickness. Right ventricular systolic function is normal. There is normal pulmonary artery systolic pressure. The tricuspid regurgitant velocity is 2.46 m/s, and  with an assumed right atrial pressure of 3 mmHg, the estimated right ventricular systolic pressure is 49.6 mmHg. Left Atrium: Left atrial size was normal in size. No left atrial/left atrial appendage thrombus was detected. The LAA emptying velocity was 60 cm/s. Right Atrium: Right atrial size was normal in size. Pericardium: There is no evidence of pericardial effusion. Mitral Valve: The mitral valve is normal in structure. Trivial mitral valve regurgitation. No evidence of mitral valve stenosis. Tricuspid Valve: The tricuspid valve is normal in structure. Tricuspid valve regurgitation is not demonstrated. No evidence of  tricuspid stenosis. Aortic Valve: The aortic valve is normal in structure. Aortic valve regurgitation is not visualized. No aortic stenosis is present. Pulmonic Valve: The pulmonic valve was normal in structure. Pulmonic valve regurgitation is trivial. No evidence of pulmonic stenosis. Aorta: The aortic root is normal in size and structure. Venous: The inferior vena cava is normal in size with greater than 50% respiratory variability, suggesting right atrial pressure of 3 mmHg. IAS/Shunts: Evidence of atrial level shunting detected by color flow Doppler. A small patent foramen ovale is detected with bidirectional shunting across atrial septum.  TRICUSPID VALVE TR Peak grad:   24.2 mmHg TR Vmax:        246.00 cm/s Sanda Klein MD Electronically signed by Sanda Klein MD Signature Date/Time: 08/02/2020/10:49:51 AM    Final  Scheduled Meds: . apixaban  5 mg Oral BID  . atorvastatin  10 mg Oral Daily  . cloNIDine  0.2 mg Oral TID  . colchicine  0.6 mg Oral BID  . diltiazem  180 mg Oral Daily  . insulin aspart  0-15 Units Subcutaneous TID WC  . insulin aspart  0-5 Units Subcutaneous QHS  . insulin glargine  10 Units Subcutaneous Daily  . metoprolol succinate  100 mg Oral Daily  . polyethylene glycol  17 g Oral Daily  . senna-docusate  1 tablet Oral BID  . sodium chloride flush  3 mL Intravenous Q12H   Continuous Infusions: .  ceFAZolin (ANCEF) IV 2 g (08/02/20 0531)     LOS: 7 days   Time spent: 52min  Domenic Polite, MD [facility]11/26/2021, 11:21 AM

## 2020-08-02 NOTE — Transfer of Care (Signed)
Immediate Anesthesia Transfer of Care Note  Patient: Gerald Day  Procedure(s) Performed: TRANSESOPHAGEAL ECHOCARDIOGRAM (TEE) (N/A ) CARDIOVERSION (N/A )  Patient Location: Endoscopy Unit  Anesthesia Type:General  Level of Consciousness: awake, alert  and oriented  Airway & Oxygen Therapy: Patient Spontanous Breathing and Patient connected to face mask oxygen  Post-op Assessment: Report given to RN, Post -op Vital signs reviewed and stable and Patient moving all extremities  Post vital signs: Reviewed and stable  Last Vitals:  Vitals Value Taken Time  BP 115/48 08/02/20 0851  Temp 36.1 C 08/02/20 0851  Pulse 66 08/02/20 0854  Resp 16 08/02/20 0854  SpO2 98 % 08/02/20 0854  Vitals shown include unvalidated device data.  Last Pain:  Vitals:   08/02/20 0851  TempSrc: Temporal  PainSc:       Patients Stated Pain Goal: 0 (38/46/65 9935)  Complications: No complications documented.

## 2020-08-02 NOTE — Progress Notes (Signed)
Progress Note  Patient Name: Gerald Day Date of Encounter: 08/02/2020  Primary Cardiologist:   Minus Breeding, MD   Subjective   Status post atrial fib.  He denies chest pain or SOB.   Inpatient Medications    Scheduled Meds: . apixaban  5 mg Oral BID  . atorvastatin  10 mg Oral Daily  . cloNIDine  0.2 mg Oral TID  . colchicine  0.6 mg Oral BID  . diltiazem  180 mg Oral Daily  . insulin aspart  0-15 Units Subcutaneous TID WC  . insulin aspart  0-5 Units Subcutaneous QHS  . insulin glargine  10 Units Subcutaneous Daily  . metoprolol tartrate  25 mg Oral Q6H  . polyethylene glycol  17 g Oral Daily  . predniSONE  20 mg Oral Once  . senna-docusate  1 tablet Oral BID  . sodium chloride flush  3 mL Intravenous Q12H   Continuous Infusions: .  ceFAZolin (ANCEF) IV 2 g (08/02/20 0531)   PRN Meds: acetaminophen **OR** acetaminophen, hydrALAZINE, ondansetron **OR** ondansetron (ZOFRAN) IV, oxyCODONE   Vital Signs    Vitals:   08/02/20 0900 08/02/20 0910 08/02/20 0920 08/02/20 0949  BP: (!) 120/59 135/64 (!) 146/67 (!) 142/73  Pulse: 67 63 66 70  Resp: 19 19 (!) 23 19  Temp:    (!) 97.5 F (36.4 C)  TempSrc:      SpO2: 100% 97% 97% 98%  Weight:      Height:        Intake/Output Summary (Last 24 hours) at 08/02/2020 1028 Last data filed at 08/02/2020 0844 Gross per 24 hour  Intake 1520 ml  Output 1320 ml  Net 200 ml   Filed Weights   07/30/20 2150 08/01/20 2117 08/02/20 0737  Weight: (!) 183.7 kg (!) 183.7 kg (!) 183.7 kg    Telemetry    NSR. - Personally Reviewed  ECG    NA - Personally Reviewed  Physical Exam   GEN: No  acute distress.   Neck: No  JVD Cardiac: RRR, no murmurs, rubs, or gallops.  Respiratory: Clear   to auscultation bilaterally. GI: Soft, nontender, non-distended, normal bowel sounds  MS:  Moderate left greater than right leg edema; No deformity. Neuro:   Nonfocal  Psych: Oriented and appropriate    Labs     Chemistry Recent Labs  Lab 07/26/20 1450 07/27/20 0445 07/31/20 0455 08/01/20 0145 08/02/20 0141  NA  --    < > 136 136 132*  K  --    < > 4.2 4.3 4.0  CL  --    < > 107 104 101  CO2  --    < > 18* 21* 20*  GLUCOSE  --    < > 199* 193* 301*  BUN  --    < > 32* 32* 40*  CREATININE  --    < > 1.88* 1.93* 1.96*  CALCIUM  --    < > 8.1* 8.0* 7.8*  PROT 6.9  --   --   --   --   ALBUMIN 2.7*  --   --   --   --   AST 21  --   --   --   --   ALT 22  --   --   --   --   ALKPHOS 87  --   --   --   --   BILITOT 1.3*  --   --   --   --  GFRNONAA  --    < > 39* 38* 37*  ANIONGAP  --    < > 11 11 11    < > = values in this interval not displayed.     Hematology Recent Labs  Lab 07/29/20 0310 08/01/20 0145 08/02/20 0141  WBC 17.2* 17.5* 18.7*  RBC 3.23* 3.08* 3.11*  HGB 9.7* 9.2* 9.4*  HCT 29.5* 28.7* 28.8*  MCV 91.3 93.2 92.6  MCH 30.0 29.9 30.2  MCHC 32.9 32.1 32.6  RDW 14.7 15.0 14.9  PLT 260 343 368    Cardiac EnzymesNo results for input(s): TROPONINI in the last 168 hours. No results for input(s): TROPIPOC in the last 168 hours.   BNP Recent Labs  Lab 07/26/20 1346 07/29/20 0310  BNP 296.5* 356.8*     DDimer No results for input(s): DDIMER in the last 168 hours.   Radiology    No results found.  Cardiac Studies   Echo:    1. Left ventricular ejection fraction, by estimation, is 55 to 60%. The left ventricle has normal function. The left ventricle has no regional wall motion abnormalities. There is mild left ventricular hypertrophy. Indeterminate diastolic filling due to E-A fusion. 2. Right ventricular systolic function was not well visualized. The right ventricular size is not well visualized. 3. Left atrial size was mildly dilated. 4. Right atrial size was mildly dilated. 5. The mitral valve is grossly normal. Trivial mitral valve regurgitation. 6. The aortic valve is tricuspid. Aortic valve regurgitation is not visualized. 7. Aortic dilatation  noted. There is mild dilatation of the aortic root, measuring 41 mm. There is mild dilatation of the ascending aorta, measuring 41 mm.  TEE:   Report pending  Patient Profile     64 y.o. male with a hx of DM, HTN, HLD, OA, OSA on CPAP, morbid obesity, CHF listed but has not had echo before and never admitted for same, who is being seen for the evaluation of Afib at the request of Dr Kurtis Bushman.  Now status post DCCV.   Assessment & Plan    ATRIAL FLUTTER WITH RVR:   Status post DDCV.   Eliquis ordered.    HTN:  Manage in the context of treating his rate.   Consolidate to Toprol XL.   CKD IIIB:   Creat is stable.    ANEMIA:  No evidence of active bleeding.  Anemia of chronic disease.  We will need to follow his H/H closely.    For questions or updates, please contact [place] Please consult [email or web address] for contact info under Cardiology/STEMI.   Signed, Minus Breeding, MD  08/02/2020, 10:28 AM

## 2020-08-02 NOTE — Anesthesia Procedure Notes (Signed)
Procedure Name: MAC Date/Time: 08/02/2020 8:35 AM Performed by: Amadeo Garnet, CRNA Pre-anesthesia Checklist: Patient identified, Emergency Drugs available, Suction available and Patient being monitored Patient Re-evaluated:Patient Re-evaluated prior to induction Oxygen Delivery Method: Nasal cannula Preoxygenation: Pre-oxygenation with 100% oxygen Induction Type: IV induction Placement Confirmation: positive ETCO2 Dental Injury: Teeth and Oropharynx as per pre-operative assessment

## 2020-08-02 NOTE — Op Note (Signed)
INDICATIONS: atrial flutter  PROCEDURE:   Informed consent was obtained prior to the procedure. The risks, benefits and alternatives for the procedure were discussed and the patient comprehended these risks.  Risks include, but are not limited to, cough, sore throat, vomiting, nausea, somnolence, esophageal and stomach trauma or perforation, bleeding, low blood pressure, aspiration, pneumonia, infection, trauma to the teeth and death.    After a procedural time-out, the oropharynx was anesthetized with 20% benzocaine spray.   During this procedure the patient was administered IV propofol by Anesthesiology (Dr. Roanna Banning).  The transesophageal probe was inserted in the esophagus and stomach without difficulty and multiple views were obtained.  The patient was kept under observation until the patient left the procedure room.  The patient left the procedure room in stable condition.   Agitated microbubble saline contrast was not administered.  COMPLICATIONS:    There were no immediate complications.  FINDINGS:  No left atrial thrombus, excellent left atrial appendage emptying velocities. LVEF 50%, no significant structural cardiac abnormalities.  RECOMMENDATIONS:     Proceed with DC cardioversion.  Time Spent Directly with the Patient:  30 minutes   Cindee Mclester 08/02/2020, 8:40 AM

## 2020-08-02 NOTE — Progress Notes (Signed)
ANTICOAGULATION CONSULT NOTE - Initial Consult  Pharmacy Consult for Eliquis  Indication: Nonvalvular Atrial fibrillation   Allergies  Allergen Reactions  . Penicillins Other (See Comments)    Unknown childhood allergic reaction Has patient had a PCN reaction causing immediate rash, facial/tongue/throat swelling, SOB or lightheadedness with hypotension: Unknown Has patient had a PCN reaction causing severe rash involving mucus membranes or skin necrosis: Unknown Has patient had a PCN reaction that required hospitalization: No Has patient had a PCN reaction occurring within the last 10 years: No If all of the above answers are "NO", then may proceed with Cephalosporin use.   . Tizanidine Other (See Comments)    07/26/20: Pt does not recognize drug and does not remember being allergic to it.    Patient Measurements: Height: 6' (182.9 cm) Weight: (!) 183.7 kg (404 lb 15.8 oz) IBW/kg (Calculated) : 77.6 Heparin Dosing Weight:   Vital Signs: Temp: 97.5 F (36.4 C) (11/26 0949) Temp Source: Temporal (11/26 0851) BP: 142/73 (11/26 0949) Pulse Rate: 70 (11/26 0949)  Labs: Recent Labs    07/31/20 0455 08/01/20 0145 08/02/20 0141  HGB  --  9.2* 9.4*  HCT  --  28.7* 28.8*  PLT  --  343 368  CREATININE 1.88* 1.93* 1.96*    Estimated Creatinine Clearance: 63.8 mL/min (A) (by C-G formula based on SCr of 1.96 mg/dL (H)).   Medical History: Past Medical History:  Diagnosis Date  . Arthritis    IN KNEES  . Cellulitis 10/26/2013   LOWER RT EXTREMITY  . CELLULITIS/ABSCESS, LEG 06/27/2007  . COLONIC POLYPS   . CONGESTIVE HEART FAILURE   . DIABETES MELLITUS, TYPE II   . DIVERTICULOSIS, COLON   . ERECTILE DYSFUNCTION, ORGANIC   . GOUT NOS   . Headache   . HYPERLIPIDEMIA   . HYPERTENSION   . LOW BACK PAIN   . Morbid obesity ([place])   . SLEEP APNEA, OBSTRUCTIVE    USES CPAP    Medications:  Medications Prior to Admission  Medication Sig Dispense Refill Last Dose  .  amLODipine (NORVASC) 10 MG tablet Take 1 tablet (10 mg total) by mouth daily. 90 tablet 3 07/26/2020 at Unknown time  . atorvastatin (LIPITOR) 10 MG tablet TAKE 1 TABLET (10 MG TOTAL) BY MOUTH DAILY. 90 tablet 3 07/26/2020 at Unknown time  . Cholecalciferol (THERA-D 2000) 50 MCG (2000 UT) TABS 1 tab by mouth once daily 90 tablet 3 07/26/2020 at Unknown time  . cloNIDine (CATAPRES) 0.3 MG tablet Take 0.3 mg by mouth in the morning, at noon, and at bedtime.   07/26/2020 at Unknown time  . furosemide (LASIX) 80 MG tablet Take 1 tablet (80 mg total) by mouth daily as needed. 90 tablet 3 07/25/2020 at Unknown time  . glipiZIDE (GLUCOTROL XL) 5 MG 24 hr tablet TAKE 1 TABLET BY MOUTH EVERY DAY WITH BREAKFAST 90 tablet 3 07/26/2020 at Unknown time  . indomethacin (INDOCIN) 50 MG capsule TAKE 1 CAPSULE (50 MG TOTAL) BY MOUTH 3 (THREE) TIMES DAILY AS NEEDED. (Patient taking differently: Take 50 mg by mouth 3 (three) times daily as needed (gout). ) 90 capsule 1 Past Month at Unknown time  . metFORMIN (GLUCOPHAGE) 500 MG tablet Take 2 tablets (1,000 mg total) by mouth 2 (two) times daily with a meal. 360 tablet 3 07/26/2020 at Unknown time  . potassium chloride SA (KLOR-CON M20) 20 MEQ tablet Take 1 tablet (20 mEq total) by mouth daily. 90 tablet 3 07/26/2020 at Unknown time  .  telmisartan (MICARDIS) 80 MG tablet Take 1 tablet (80 mg total) by mouth daily. 90 tablet 3 07/26/2020 at Unknown time  . vitamin B-12 (CYANOCOBALAMIN) 1000 MCG tablet Take 1 tablet (1,000 mcg total) by mouth daily. 90 tablet 3 07/26/2020 at Unknown time    Assessment: 64 y.o male.  Pharmacy consulted to start Eliquis for nonvalvular Atrial fibrilation.  CBC stable with hgb 9.4, pltc in 300s.   VTE prophylaxis Lovenox last given 11/25 PM. Will stop lovenox. 64 y.o , wt 183.7 kg, SCr 1.96.  Will start Eliquis 5 mg bid Will need to educate patient about eliquis prior to discharge.  Goal of Therapy:  Prevent stroke Monitor platelets by  anticoagulation protocol: Yes   Plan:  Discontinue VTE prophylaxis Lovenox now.  Start Elquis 5 mg BID for nonvalvular AFib Will need to educate patient about eliquis   Nicole Cella, [place] [phone] Please check AMION for all [place] phone numbers After 10:00 PM, call [place] [phone]  08/02/2020,10:03 AM

## 2020-08-02 NOTE — Discharge Instructions (Signed)

## 2020-08-03 DIAGNOSIS — E1169 Type 2 diabetes mellitus with other specified complication: Secondary | ICD-10-CM | POA: Diagnosis not present

## 2020-08-03 DIAGNOSIS — L0390 Cellulitis, unspecified: Secondary | ICD-10-CM | POA: Diagnosis not present

## 2020-08-03 DIAGNOSIS — A419 Sepsis, unspecified organism: Secondary | ICD-10-CM | POA: Diagnosis not present

## 2020-08-03 DIAGNOSIS — I10 Essential (primary) hypertension: Secondary | ICD-10-CM | POA: Diagnosis not present

## 2020-08-03 DIAGNOSIS — I4892 Unspecified atrial flutter: Secondary | ICD-10-CM | POA: Diagnosis not present

## 2020-08-03 DIAGNOSIS — I484 Atypical atrial flutter: Secondary | ICD-10-CM | POA: Diagnosis not present

## 2020-08-03 LAB — CBC
HCT: 30 % — ABNORMAL LOW (ref 39.0–52.0)
Hemoglobin: 9.6 g/dL — ABNORMAL LOW (ref 13.0–17.0)
MCH: 29.3 pg (ref 26.0–34.0)
MCHC: 32 g/dL (ref 30.0–36.0)
MCV: 91.5 fL (ref 80.0–100.0)
Platelets: 424 10*3/uL — ABNORMAL HIGH (ref 150–400)
RBC: 3.28 MIL/uL — ABNORMAL LOW (ref 4.22–5.81)
RDW: 14.8 % (ref 11.5–15.5)
WBC: 18.8 10*3/uL — ABNORMAL HIGH (ref 4.0–10.5)
nRBC: 0 % (ref 0.0–0.2)

## 2020-08-03 LAB — BASIC METABOLIC PANEL
Anion gap: 14 (ref 5–15)
BUN: 48 mg/dL — ABNORMAL HIGH (ref 8–23)
CO2: 19 mmol/L — ABNORMAL LOW (ref 22–32)
Calcium: 8.1 mg/dL — ABNORMAL LOW (ref 8.9–10.3)
Chloride: 100 mmol/L (ref 98–111)
Creatinine, Ser: 2.08 mg/dL — ABNORMAL HIGH (ref 0.61–1.24)
GFR, Estimated: 35 mL/min — ABNORMAL LOW (ref >60)
Glucose, Bld: 247 mg/dL — ABNORMAL HIGH (ref 70–99)
Potassium: 4 mmol/L (ref 3.5–5.1)
Sodium: 133 mmol/L — ABNORMAL LOW (ref 135–145)

## 2020-08-03 LAB — CULTURE, BLOOD (ROUTINE X 2)
Culture: NO GROWTH
Culture: NO GROWTH

## 2020-08-03 LAB — GLUCOSE, CAPILLARY
Glucose-Capillary: 248 mg/dL — ABNORMAL HIGH (ref 70–99)
Glucose-Capillary: 280 mg/dL — ABNORMAL HIGH (ref 70–99)
Glucose-Capillary: 242 mg/dL — ABNORMAL HIGH (ref 70–99)
Glucose-Capillary: 281 mg/dL — ABNORMAL HIGH (ref 70–99)

## 2020-08-03 MED ORDER — INSULIN ASPART 100 UNIT/ML ~~LOC~~ SOLN
4.0000 [IU] | Freq: Three times a day (TID) | SUBCUTANEOUS | Status: DC
  Administered 2020-08-03 – 2020-08-05 (×6): 4 [IU] via SUBCUTANEOUS

## 2020-08-03 MED ORDER — INSULIN GLARGINE 100 UNIT/ML ~~LOC~~ SOLN
10.0000 [IU] | Freq: Once | SUBCUTANEOUS | Status: AC
  Administered 2020-08-03: 10 [IU] via SUBCUTANEOUS
  Filled 2020-08-03: qty 0.1

## 2020-08-03 MED ORDER — INSULIN ASPART 100 UNIT/ML ~~LOC~~ SOLN
3.0000 [IU] | Freq: Three times a day (TID) | SUBCUTANEOUS | Status: DC
  Administered 2020-08-03: 3 [IU] via SUBCUTANEOUS

## 2020-08-03 MED ORDER — PREDNISONE 20 MG PO TABS
20.0000 mg | ORAL_TABLET | Freq: Once | ORAL | Status: AC
  Administered 2020-08-03: 20 mg via ORAL
  Filled 2020-08-03: qty 1

## 2020-08-03 NOTE — Progress Notes (Signed)
This RN and 2 other staff members helped to assist patient to bedside commode.  Once patient had BM, was not able to stand up on his own, or with assistance of 4 staff members. Patient stated "I do not have the strength to stand up at all".  Maximove was used to get patient off of bedside commode and back into bed.  MD notified. Will continue to monitor.

## 2020-08-03 NOTE — Progress Notes (Signed)
PROGRESS NOTE    ARM[place]ION GREEK  XNT:[id number] DOB: [birth year] DOA: 07/26/2020 PCP: Biagio Borg, MD    Brief Narrative:  64/M with morbid obesity, OSA, type 2 diabetes mellitus, chronic diastolic CHF, chronic lymphedema was admitted with sepsis, left lower extremity cellulitis complicated by acute kidney injury. -In addition noted to have atrial flutter this hospitalization  Assessment & Plan:   Sepsis secondary to left lower extremity cellulitis -Blood cultures are negative -Clinically improving on IV Ancef, appreciate infectious disease input -MRI/CT without evidence of deep infection, abscess -Continue leg elevation as tolerated -Clinically cellulitis is improving but leukocytosis persists likely secondary to steroids, afebrile and nontoxic, monitor -Labs in a.m. -Ambulate, PT as tolerated -SNF recommended for rehabilitation, social work consulted  Acute gout flare -Had considerable tenderness of both ankles, right MTP joint, uric acid greater than 10 -Known history of gout, acute flare suspected and started on prednisone 2 days ago with colchicine -Prednisone x1 more today and then monitor  Atrial flutter with RVR -cards following,  -Remains on Cardizem, metoprolol, DC cardioverted back into sinus rhythm yesterday -Continue Eliquis  AKI on CKD 3 A -Creatinine was 2.6 on admission, baseline around 1.5 -Likely ATN, now down to 1.9, monitor closely, avoid nephrotoxins  Type 2 diabetes mellitus -Hemoglobin A1c is 7.0,  -CBGs higher in the setting of prednisone, increase Lantus to 20 units  Morbid obesity OSA -Continue CPAP, needs to lose weight  Hypertension -Cut down clonidine, continue Norvasc   DVT prophylaxis: Lovenox Code Status: Full code Family Communication: None at bedside  Status is: Inpatient  Remains inpatient appropriate because:IV treatments appropriate due to intensity of illness    Dispo: The patient is from: Home               Anticipated d/c is to: SNF              Anticipated d/c date is: 2 to 3 days              Patient currently is not medically stable to d/c   Consultants:   ID  Procedures:  CT foot/tibia/fibula 1. Severe and diffuse skin thickening, subcutaneous soft tissue swelling/edema/fluid and what appear to be wounds along the lateral aspect of the lower extremity. No discrete fluid collection but exam limited without contrast to assess for abscess. 2. No findings suspicious for myofasciitis or pyomyositis. 3. No CT findings suspicious for septic arthritis or osteomyelitis. 4. Moderate-sized knee joint effusion. 5. Age advanced vascular calcifications.   Antimicrobials:   Vancomycin, cefepime--->>dc 11/23  Metronidazole d/c'd Cefazolin start 11/23...>  Subjective: -Feels better overall pain is improving, wondering if he can go home  Objective: Vitals:   08/02/20 1756 08/02/20 2045 08/03/20 0507 08/03/20 0941  BP: (!) 152/74 (!) 142/84 (!) 145/81 (!) 151/69  Pulse: 68 64 65 74  Resp: 18 18 19 18   Temp: 98.5 F (36.9 C) 98 F (36.7 C) 98 F (36.7 C) 97.7 F (36.5 C)  TempSrc:  Oral  Oral  SpO2: 95% 96% 98% 98%  Weight:      Height:        Intake/Output Summary (Last 24 hours) at 08/03/2020 1211 Last data filed at 08/03/2020 1027 Gross per 24 hour  Intake 926 ml  Output 450 ml  Net 476 ml   Filed Weights   07/30/20 2150 08/01/20 2117 08/02/20 0737  Weight: (!) 183.7 kg (!) 183.7 kg (!) 183.7 kg    Examination: General exam: Pleasant obese male laying  in bed, AAOx3, no distress HEENT: Neck obese unable to assess JVD CVS: S1-S2, regular rate rhythm Lungs: Distant breath sounds otherwise clear Abdomen: Obese, soft, nontender, bowel sounds present Extremities: Improving erythema in left lower leg, slightly worse posteriorly, improving tenderness of both ankle joints and right MTP joint Psychiatry:  Mood & affect appropriate.      Data Reviewed: I have personally  reviewed following labs and imaging studies  CBC: Recent Labs  Lab 07/28/20 0454 07/29/20 0310 08/01/20 0145 08/02/20 0141 08/03/20 0726  WBC 19.3* 17.2* 17.5* 18.7* 18.8*  NEUTROABS  --   --  14.2*  --   --   HGB 9.4* 9.7* 9.2* 9.4* 9.6*  HCT 29.0* 29.5* 28.7* 28.8* 30.0*  MCV 92.1 91.3 93.2 92.6 91.5  PLT 239 260 343 368 409*   Basic Metabolic Panel: Recent Labs  Lab 07/30/20 0146 07/31/20 0455 08/01/20 0145 08/02/20 0141 08/03/20 0726  NA 137 136 136 132* 133*  K 4.5 4.2 4.3 4.0 4.0  CL 104 107 104 101 100  CO2 20* 18* 21* 20* 19*  GLUCOSE 191* 199* 193* 301* 247*  BUN 33* 32* 32* 40* 48*  CREATININE 1.84* 1.88* 1.93* 1.96* 2.08*  CALCIUM 8.2* 8.1* 8.0* 7.8* 8.1*   GFR: Estimated Creatinine Clearance: 60.1 mL/min (A) (by C-G formula based on SCr of 2.08 mg/dL (H)). Liver Function Tests: No results for input(s): AST, ALT, ALKPHOS, BILITOT, PROT, ALBUMIN in the last 168 hours. No results for input(s): LIPASE, AMYLASE in the last 168 hours. No results for input(s): AMMONIA in the last 168 hours. Coagulation Profile: No results for input(s): INR, PROTIME in the last 168 hours. Cardiac Enzymes: No results for input(s): CKTOTAL, CKMB, CKMBINDEX, TROPONINI in the last 168 hours. BNP (last 3 results) No results for input(s): PROBNP in the last 8760 hours. HbA1C: No results for input(s): HGBA1C in the last 72 hours. CBG: Recent Labs  Lab 08/02/20 1111 08/02/20 1602 08/02/20 2047 08/03/20 0651 08/03/20 1131  GLUCAP 215* 340* 280* 248* 280*   Lipid Profile: No results for input(s): CHOL, HDL, LDLCALC, TRIG, CHOLHDL, LDLDIRECT in the last 72 hours. Thyroid Function Tests: No results for input(s): TSH, T4TOTAL, FREET4, T3FREE, THYROIDAB in the last 72 hours. Anemia Panel: No results for input(s): VITAMINB12, FOLATE, FERRITIN, TIBC, IRON, RETICCTPCT in the last 72 hours. Sepsis Labs: No results for input(s): PROCALCITON, LATICACIDVEN in the last 168  hours.  Recent Results (from the past 240 hour(s))  Blood Culture (routine x 2)     Status: None   Collection Time: 07/26/20  2:50 PM   Specimen: BLOOD  Result Value Ref Range Status   Specimen Description BLOOD RIGHT ANTECUBITAL  Final   Special Requests   Final    BLOOD Blood Culture results may not be optimal due to an inadequate volume of blood received in culture bottles   Culture   Final    NO GROWTH 5 DAYS Performed at [facility], [place] [address], [place], [place] [address]    Report Status 07/31/2020 FINAL  Final  Blood Culture (routine x 2)     Status: None   Collection Time: 07/26/20  3:46 PM   Specimen: BLOOD  Result Value Ref Range Status   Specimen Description BLOOD SITE NOT SPECIFIED  Final   Special Requests   Final    BOTTLES DRAWN AEROBIC AND ANAEROBIC Blood Culture adequate volume   Culture   Final    NO GROWTH 5 DAYS Performed at [facility], [place] [address], [place], [place] [address]    Report Status 07/31/2020 FINAL  Final  Respiratory Panel by RT PCR (Flu A&B, Covid) - Nasopharyngeal Swab     Status: None   Collection Time: 07/26/20  5:54 PM   Specimen: Nasopharyngeal Swab; Nasopharyngeal(NP) swabs in vial transport medium  Result Value Ref Range Status   SARS Coronavirus 2 by RT PCR NEGATIVE NEGATIVE Final    Comment: (NOTE) SARS-CoV-2 target nucleic acids are NOT DETECTED.  The SARS-CoV-2 RNA is generally detectable in upper respiratoy specimens during the acute phase of infection. The lowest concentration of SARS-CoV-2 viral copies this assay can detect is 131 copies/mL. A negative result does not preclude SARS-Cov-2 infection and should not be used as the sole basis for treatment or other patient management decisions. A negative result may occur with  improper specimen collection/handling, submission of specimen other than nasopharyngeal swab, presence of viral mutation(s) within the areas targeted by this assay, and inadequate number  of viral copies (<131 copies/mL). A negative result must be combined with clinical observations, patient history, and epidemiological information. The expected result is Negative.  Fact Sheet for Patients:  PinkCheek.be  Fact Sheet for Healthcare Providers:  GravelBags.it  This test is no t yet approved or cleared by the [place] FDA and  has been authorized for detection and/or diagnosis of SARS-CoV-2 by FDA under an Emergency Use Authorization (EUA). This EUA will remain  in effect (meaning this test can be used) for the duration of the COVID-19 declaration under Section 564(b)(1) of the Act, 21 U.S.C. section 360bbb-3(b)(1), unless the authorization is terminated or revoked sooner.     Influenza A by PCR NEGATIVE NEGATIVE Final   Influenza B by PCR NEGATIVE NEGATIVE Final    Comment: (NOTE) The Xpert Xpress SARS-CoV-2/FLU/RSV assay is intended as an aid in  the diagnosis of influenza from Nasopharyngeal swab specimens and  should not be used as a sole basis for treatment. Nasal washings and  aspirates are unacceptable for Xpert Xpress SARS-CoV-2/FLU/RSV  testing.  Fact Sheet for Patients: PinkCheek.be  Fact Sheet for Healthcare Providers: GravelBags.it  This test is not yet approved or cleared by the [place] FDA and  has been authorized for detection and/or diagnosis of SARS-CoV-2 by  FDA under an Emergency Use Authorization (EUA). This EUA will remain  in effect (meaning this test can be used) for the duration of the  Covid-19 declaration under Section 564(b)(1) of the Act, 21  U.S.C. section 360bbb-3(b)(1), unless the authorization is  terminated or revoked. Performed at [facility], [place] [address], [place], [place] [address]   Urine Culture     Status: None   Collection Time: 07/27/20  1:45 PM   Specimen: Urine, Random  Result Value  Ref Range Status   Specimen Description URINE, RANDOM  Final   Special Requests NONE  Final   Culture   Final    NO GROWTH Performed at [facility], [place] [address] [place], [place] [address]    Report Status 07/28/2020 FINAL  Final  Culture, blood (routine x 2)     Status: None   Collection Time: 07/29/20  5:52 PM   Specimen: BLOOD  Result Value Ref Range Status   Specimen Description BLOOD LEFT ANTECUBITAL  Final   Special Requests   Final    BOTTLES DRAWN AEROBIC AND ANAEROBIC Blood Culture results may not be optimal due to an excessive volume of blood received  in culture bottles   Culture   Final    NO GROWTH 5 DAYS Performed at [facility], [place] [address] [place], [place] [address]    Report Status 08/03/2020 FINAL  Final  Culture, blood (routine x 2)     Status: None   Collection Time: 07/29/20  5:57 PM   Specimen: BLOOD  Result Value Ref Range Status   Specimen Description BLOOD RIGHT ANTECUBITAL  Final   Special Requests   Final    BOTTLES DRAWN AEROBIC AND ANAEROBIC Blood Culture results may not be optimal due to an excessive volume of blood received in culture bottles   Culture   Final    NO GROWTH 5 DAYS Performed at [facility], [place] [address], [place], [place] [address]    Report Status 08/03/2020 FINAL  Final  Urine Culture     Status: None   Collection Time: 07/30/20 12:22 PM   Specimen: Urine, Random  Result Value Ref Range Status   Specimen Description URINE, RANDOM  Final   Special Requests NONE  Final   Culture   Final    NO GROWTH Performed at [facility], [place] [address] [place], [place] [address]    Report Status 07/31/2020 FINAL  Final         Radiology Studies: ECHO TEE  Result Date: 08/02/2020    TRANSESOPHOGEAL ECHO REPORT   Patient Name:   WAKE CONLEE Date of Exam: 08/02/2020 Medical Rec #:  [id number]        Height:       72.0 in Accession #:    [id number]       Weight:       405.0 lb Date of Birth:   07-14-1956        BSA:          2.876 m Patient Age:    35 years         BP:           115/48 mmHg Patient Gender: M                HR:           63 bpm. Exam Location:  Inpatient Procedure: Transesophageal Echo, Color Doppler and Cardiac Doppler Indications:     Atrial Flutter  History:         Patient has prior history of Echocardiogram examinations, most                  recent 07/29/2020. Risk Factors:Hypertension, Diabetes,                  Dyslipidemia and Sleep Apnea.  Sonographer:     Darlina Sicilian RDCS Referring Phys:  [id number] Nadean Corwin HILTY Diagnosing Phys: Sanda Klein MD PROCEDURE: After discussion of the risks and benefits of a TEE, an informed consent was obtained. TEE procedure time was 29 minutes. The transesophogeal probe was passed without difficulty through the esophogus of the patient. Imaged were obtained with the patient in a supine position. Sedation performed by different physician. The patient was monitored while under deep sedation. Anesthestetic sedation was provided intravenously by Anesthesiology: 288mg  of Propofol. Image quality was good. The patient's vital signs; including heart rate, blood pressure, and oxygen saturation; remained stable throughout the procedure. The patient developed no complications during the procedure. A direct current cardioversion was performed at 200 joules with 1 attempt. IMPRESSIONS  1. Left ventricular ejection fraction, by estimation,  is 50 to 55%. The left ventricle has low normal function. The left ventricle has no regional wall motion abnormalities.  2. Right ventricular systolic function is normal. The right ventricular size is normal. There is normal pulmonary artery systolic pressure.  3. No left atrial/left atrial appendage thrombus was detected. The LAA emptying velocity was 60 cm/s.  4. The mitral valve is normal in structure. Trivial mitral valve regurgitation. No evidence of mitral stenosis.  5. The aortic valve is normal in structure. Aortic  valve regurgitation is not visualized. No aortic stenosis is present.  6. The inferior vena cava is normal in size with greater than 50% respiratory variability, suggesting right atrial pressure of 3 mmHg.  7. Evidence of atrial level shunting detected by color flow Doppler. There is a very small patent foramen ovale with a tiny amount of bidirectional shunting across atrial septum. Conclusion(s)/Recommendation(s): Normal biventricular function without evidence of hemodynamically significant valvular heart disease. FINDINGS  Left Ventricle: Left ventricular ejection fraction, by estimation, is 50 to 55%. The left ventricle has low normal function. The left ventricle has no regional wall motion abnormalities. The left ventricular internal cavity size was normal in size. There is no left ventricular hypertrophy. Right Ventricle: The right ventricular size is normal. No increase in right ventricular wall thickness. Right ventricular systolic function is normal. There is normal pulmonary artery systolic pressure. The tricuspid regurgitant velocity is 2.46 m/s, and  with an assumed right atrial pressure of 3 mmHg, the estimated right ventricular systolic pressure is 60.1 mmHg. Left Atrium: Left atrial size was normal in size. No left atrial/left atrial appendage thrombus was detected. The LAA emptying velocity was 60 cm/s. Right Atrium: Right atrial size was normal in size. Pericardium: There is no evidence of pericardial effusion. Mitral Valve: The mitral valve is normal in structure. Trivial mitral valve regurgitation. No evidence of mitral valve stenosis. Tricuspid Valve: The tricuspid valve is normal in structure. Tricuspid valve regurgitation is not demonstrated. No evidence of tricuspid stenosis. Aortic Valve: The aortic valve is normal in structure. Aortic valve regurgitation is not visualized. No aortic stenosis is present. Pulmonic Valve: The pulmonic valve was normal in structure. Pulmonic valve regurgitation  is trivial. No evidence of pulmonic stenosis. Aorta: The aortic root is normal in size and structure. Venous: The inferior vena cava is normal in size with greater than 50% respiratory variability, suggesting right atrial pressure of 3 mmHg. IAS/Shunts: Evidence of atrial level shunting detected by color flow Doppler. A small patent foramen ovale is detected with bidirectional shunting across atrial septum.  TRICUSPID VALVE TR Peak grad:   24.2 mmHg TR Vmax:        246.00 cm/s Dani Gobble Croitoru MD Electronically signed by Sanda Klein MD Signature Date/Time: 08/02/2020/10:49:51 AM    Final         Scheduled Meds: . apixaban  5 mg Oral BID  . atorvastatin  10 mg Oral Daily  . cloNIDine  0.1 mg Oral BID  . diltiazem  180 mg Oral Daily  . insulin aspart  0-15 Units Subcutaneous TID WC  . insulin aspart  0-5 Units Subcutaneous QHS  . insulin aspart  3 Units Subcutaneous TID WC  . insulin glargine  20 Units Subcutaneous Daily  . metoprolol succinate  100 mg Oral Daily  . polyethylene glycol  17 g Oral Daily  . senna-docusate  1 tablet Oral BID  . sodium chloride flush  3 mL Intravenous Q12H   Continuous Infusions: .  ceFAZolin (ANCEF)  IV 2 g (08/03/20 0538)     LOS: 8 days   Time spent: 49min  Domenic Polite, MD [facility]11/27/2021, 12:11 PM

## 2020-08-03 NOTE — Progress Notes (Signed)
Progress Note  Patient Name: Gerald Day Date of Encounter: 08/03/2020  Primary Cardiologist:   Minus Breeding, MD   Subjective   He denies chest pain.  Palpitations or SOB. Weak.   Inpatient Medications    Scheduled Meds: . apixaban  5 mg Oral BID  . atorvastatin  10 mg Oral Daily  . cloNIDine  0.1 mg Oral BID  . diltiazem  180 mg Oral Daily  . insulin aspart  0-15 Units Subcutaneous TID WC  . insulin aspart  0-5 Units Subcutaneous QHS  . insulin aspart  3 Units Subcutaneous TID WC  . insulin glargine  20 Units Subcutaneous Daily  . metoprolol succinate  100 mg Oral Daily  . polyethylene glycol  17 g Oral Daily  . senna-docusate  1 tablet Oral BID  . sodium chloride flush  3 mL Intravenous Q12H   Continuous Infusions: .  ceFAZolin (ANCEF) IV 2 g (08/03/20 0538)   PRN Meds: acetaminophen **OR** acetaminophen, hydrALAZINE, ondansetron **OR** ondansetron (ZOFRAN) IV, oxyCODONE   Vital Signs    Vitals:   08/02/20 1756 08/02/20 2045 08/03/20 0507 08/03/20 0941  BP: (!) 152/74 (!) 142/84 (!) 145/81 (!) 151/69  Pulse: 68 64 65 74  Resp: 18 18 19 18   Temp: 98.5 F (36.9 C) 98 F (36.7 C) 98 F (36.7 C) 97.7 F (36.5 C)  TempSrc:  Oral  Oral  SpO2: 95% 96% 98% 98%  Weight:      Height:        Intake/Output Summary (Last 24 hours) at 08/03/2020 0957 Last data filed at 08/03/2020 0600 Gross per 24 hour  Intake 923 ml  Output 450 ml  Net 473 ml   Filed Weights   07/30/20 2150 08/01/20 2117 08/02/20 0737  Weight: (!) 183.7 kg (!) 183.7 kg (!) 183.7 kg    Telemetry    NSR. - Personally Reviewed  ECG    NSR, infrequent ectopy. - Personally Reviewed  Physical Exam   GEN: No  acute distress.   Neck: No  JVD Cardiac: RRR, no murmurs, rubs, or gallops.  Respiratory: Clear   to auscultation bilaterally. GI: Soft, nontender, non-distended, normal bowel sounds  MS:  Mild bilateral leg edema; No deformity. Neuro:   Nonfocal  Psych: Oriented and  appropriate     Labs    Chemistry Recent Labs  Lab 08/01/20 0145 08/02/20 0141 08/03/20 0726  NA 136 132* 133*  K 4.3 4.0 4.0  CL 104 101 100  CO2 21* 20* 19*  GLUCOSE 193* 301* 247*  BUN 32* 40* 48*  CREATININE 1.93* 1.96* 2.08*  CALCIUM 8.0* 7.8* 8.1*  GFRNONAA 38* 37* 35*  ANIONGAP 11 11 14      Hematology Recent Labs  Lab 08/01/20 0145 08/02/20 0141 08/03/20 0726  WBC 17.5* 18.7* 18.8*  RBC 3.08* 3.11* 3.28*  HGB 9.2* 9.4* 9.6*  HCT 28.7* 28.8* 30.0*  MCV 93.2 92.6 91.5  MCH 29.9 30.2 29.3  MCHC 32.1 32.6 32.0  RDW 15.0 14.9 14.8  PLT 343 368 424*    Cardiac EnzymesNo results for input(s): TROPONINI in the last 168 hours. No results for input(s): TROPIPOC in the last 168 hours.   BNP Recent Labs  Lab 07/29/20 0310  BNP 356.8*     DDimer No results for input(s): DDIMER in the last 168 hours.   Radiology    ECHO TEE  Result Date: 08/02/2020    TRANSESOPHOGEAL ECHO REPORT   [name]  Date of Exam: 08/02/2020 Medical Rec #:  [id number]        Height:       72.0 in Accession #:    [id number]       Weight:       405.0 lb Date of Birth:  Apr 21, 1956        BSA:          2.876 m Patient Age:    64 years         BP:           115/48 mmHg Patient Gender: M                HR:           63 bpm. Exam Location:  Inpatient Procedure: Transesophageal Echo, Color Doppler and Cardiac Doppler Indications:     Atrial Flutter  History:         Patient has prior history of Echocardiogram examinations, most                  recent 07/29/2020. Risk Factors:Hypertension, Diabetes,                  Dyslipidemia and Sleep Apnea.  Sonographer:     Darlina Sicilian RDCS Referring Phys:  [id number] Nadean Corwin HILTY Diagnosing Phys: Sanda Klein MD PROCEDURE: After discussion of the risks and benefits of a TEE, an informed consent was obtained. TEE procedure time was 29 minutes. The transesophogeal probe was passed without difficulty through the esophogus of the patient.  Imaged were obtained with the patient in a supine position. Sedation performed by different physician. The patient was monitored while under deep sedation. Anesthestetic sedation was provided intravenously by Anesthesiology: 288mg  of Propofol. Image quality was good. The patient's vital signs; including heart rate, blood pressure, and oxygen saturation; remained stable throughout the procedure. The patient developed no complications during the procedure. A direct current cardioversion was performed at 200 joules with 1 attempt. IMPRESSIONS  1. Left ventricular ejection fraction, by estimation, is 50 to 55%. The left ventricle has low normal function. The left ventricle has no regional wall motion abnormalities.  2. Right ventricular systolic function is normal. The right ventricular size is normal. There is normal pulmonary artery systolic pressure.  3. No left atrial/left atrial appendage thrombus was detected. The LAA emptying velocity was 60 cm/s.  4. The mitral valve is normal in structure. Trivial mitral valve regurgitation. No evidence of mitral stenosis.  5. The aortic valve is normal in structure. Aortic valve regurgitation is not visualized. No aortic stenosis is present.  6. The inferior vena cava is normal in size with greater than 50% respiratory variability, suggesting right atrial pressure of 3 mmHg.  7. Evidence of atrial level shunting detected by color flow Doppler. There is a very small patent foramen ovale with a tiny amount of bidirectional shunting across atrial septum. Conclusion(s)/Recommendation(s): Normal biventricular function without evidence of hemodynamically significant valvular heart disease. FINDINGS  Left Ventricle: Left ventricular ejection fraction, by estimation, is 50 to 55%. The left ventricle has low normal function. The left ventricle has no regional wall motion abnormalities. The left ventricular internal cavity size was normal in size. There is no left ventricular  hypertrophy. Right Ventricle: The right ventricular size is normal. No increase in right ventricular wall thickness. Right ventricular systolic function is normal. There is normal pulmonary artery systolic pressure. The tricuspid regurgitant velocity is 2.46 m/s, and  with an assumed right atrial pressure  of 3 mmHg, the estimated right ventricular systolic pressure is 85.8 mmHg. Left Atrium: Left atrial size was normal in size. No left atrial/left atrial appendage thrombus was detected. The LAA emptying velocity was 60 cm/s. Right Atrium: Right atrial size was normal in size. Pericardium: There is no evidence of pericardial effusion. Mitral Valve: The mitral valve is normal in structure. Trivial mitral valve regurgitation. No evidence of mitral valve stenosis. Tricuspid Valve: The tricuspid valve is normal in structure. Tricuspid valve regurgitation is not demonstrated. No evidence of tricuspid stenosis. Aortic Valve: The aortic valve is normal in structure. Aortic valve regurgitation is not visualized. No aortic stenosis is present. Pulmonic Valve: The pulmonic valve was normal in structure. Pulmonic valve regurgitation is trivial. No evidence of pulmonic stenosis. Aorta: The aortic root is normal in size and structure. Venous: The inferior vena cava is normal in size with greater than 50% respiratory variability, suggesting right atrial pressure of 3 mmHg. IAS/Shunts: Evidence of atrial level shunting detected by color flow Doppler. A small patent foramen ovale is detected with bidirectional shunting across atrial septum.  TRICUSPID VALVE TR Peak grad:   24.2 mmHg TR Vmax:        246.00 cm/s Sanda Klein MD Electronically signed by Sanda Klein MD Signature Date/Time: 08/02/2020/10:49:51 AM    Final     Cardiac Studies   Echo:    1. Left ventricular ejection fraction, by estimation, is 55 to 60%. The left ventricle has normal function. The left ventricle has no regional wall motion abnormalities. There  is mild left ventricular hypertrophy. Indeterminate diastolic filling due to E-A fusion. 2. Right ventricular systolic function was not well visualized. The right ventricular size is not well visualized. 3. Left atrial size was mildly dilated. 4. Right atrial size was mildly dilated. 5. The mitral valve is grossly normal. Trivial mitral valve regurgitation. 6. The aortic valve is tricuspid. Aortic valve regurgitation is not visualized. 7. Aortic dilatation noted. There is mild dilatation of the aortic root, measuring 41 mm. There is mild dilatation of the ascending aorta, measuring 41 mm.  TEE:   1. Left ventricular ejection fraction, by estimation, is 50 to 55%. The  left ventricle has low normal function. The left ventricle has no regional  wall motion abnormalities.  2. Right ventricular systolic function is normal. The right ventricular  size is normal. There is normal pulmonary artery systolic pressure.  3. No left atrial/left atrial appendage thrombus was detected. The LAA  emptying velocity was 60 cm/s.  4. The mitral valve is normal in structure. Trivial mitral valve  regurgitation. No evidence of mitral stenosis.  5. The aortic valve is normal in structure. Aortic valve regurgitation is  not visualized. No aortic stenosis is present.  6. The inferior vena cava is normal in size with greater than 50%  respiratory variability, suggesting right atrial pressure of 3 mmHg.  7. Evidence of atrial level shunting detected by color flow Doppler.  There is a very small patent foramen ovale with a tiny amount of  bidirectional shunting across atrial septum.   Patient Profile     64 y.o. male with a hx of DM, HTN, HLD, OA, OSA on CPAP, morbid obesity, CHF listed but has not had echo before and never admitted for same, who is being seen for the evaluation of Afib at the request of Dr Kurtis Bushman.  Now status post DCCV.   Assessment & Plan    ATRIAL FLUTTER WITH RVR:   Status post DDCV.  On Eliquis.    HTN:  Manage in the context of treating his rate.   Consolidated to Toprol XL  BP is running hight and might need an increased in his clonidine before discharge.  For now continue current meds.   CKD IIIB:   Creat is slightly elevated.    He is not being diuresed.   Follow.    ANEMIA:  No evidence of active bleeding.  Anemia of chronic disease.  Tolerating anticoagulation.    For questions or updates, please contact [place] Please consult [email or web address] for contact info under Cardiology/STEMI.   Signed, Minus Breeding, MD  08/03/2020, 9:57 AM

## 2020-08-04 ENCOUNTER — Encounter (HOSPITAL_COMMUNITY): Payer: Self-pay | Admitting: Cardiovascular Disease

## 2020-08-04 ENCOUNTER — Other Ambulatory Visit: Payer: Self-pay | Admitting: Internal Medicine

## 2020-08-04 DIAGNOSIS — I4892 Unspecified atrial flutter: Secondary | ICD-10-CM | POA: Diagnosis not present

## 2020-08-04 DIAGNOSIS — E1169 Type 2 diabetes mellitus with other specified complication: Secondary | ICD-10-CM | POA: Diagnosis not present

## 2020-08-04 DIAGNOSIS — L0390 Cellulitis, unspecified: Secondary | ICD-10-CM | POA: Diagnosis not present

## 2020-08-04 DIAGNOSIS — I484 Atypical atrial flutter: Secondary | ICD-10-CM | POA: Diagnosis not present

## 2020-08-04 DIAGNOSIS — I10 Essential (primary) hypertension: Secondary | ICD-10-CM | POA: Diagnosis not present

## 2020-08-04 DIAGNOSIS — A419 Sepsis, unspecified organism: Secondary | ICD-10-CM | POA: Diagnosis not present

## 2020-08-04 LAB — BASIC METABOLIC PANEL
Anion gap: 12 (ref 5–15)
BUN: 52 mg/dL — ABNORMAL HIGH (ref 8–23)
CO2: 19 mmol/L — ABNORMAL LOW (ref 22–32)
Calcium: 7.9 mg/dL — ABNORMAL LOW (ref 8.9–10.3)
Chloride: 101 mmol/L (ref 98–111)
Creatinine, Ser: 1.98 mg/dL — ABNORMAL HIGH (ref 0.61–1.24)
GFR, Estimated: 37 mL/min — ABNORMAL LOW (ref >60)
Glucose, Bld: 354 mg/dL — ABNORMAL HIGH (ref 70–99)
Potassium: 4.7 mmol/L (ref 3.5–5.1)
Sodium: 132 mmol/L — ABNORMAL LOW (ref 135–145)

## 2020-08-04 LAB — CBC
HCT: 28.4 % — ABNORMAL LOW (ref 39.0–52.0)
Hemoglobin: 9.1 g/dL — ABNORMAL LOW (ref 13.0–17.0)
MCH: 29.4 pg (ref 26.0–34.0)
MCHC: 32 g/dL (ref 30.0–36.0)
MCV: 91.6 fL (ref 80.0–100.0)
Platelets: 352 10*3/uL (ref 150–400)
RBC: 3.1 MIL/uL — ABNORMAL LOW (ref 4.22–5.81)
RDW: 14.9 % (ref 11.5–15.5)
WBC: 17.4 10*3/uL — ABNORMAL HIGH (ref 4.0–10.5)
nRBC: 0 % (ref 0.0–0.2)

## 2020-08-04 LAB — GLUCOSE, CAPILLARY
Glucose-Capillary: 268 mg/dL — ABNORMAL HIGH (ref 70–99)
Glucose-Capillary: 266 mg/dL — ABNORMAL HIGH (ref 70–99)
Glucose-Capillary: 204 mg/dL — ABNORMAL HIGH (ref 70–99)
Glucose-Capillary: 259 mg/dL — ABNORMAL HIGH (ref 70–99)

## 2020-08-04 MED ORDER — INSULIN GLARGINE 100 UNIT/ML ~~LOC~~ SOLN
25.0000 [IU] | Freq: Every day | SUBCUTANEOUS | Status: DC
  Administered 2020-08-04 – 2020-08-05 (×2): 25 [IU] via SUBCUTANEOUS
  Filled 2020-08-04 (×2): qty 0.25

## 2020-08-04 MED ORDER — CLONIDINE HCL 0.2 MG PO TABS
0.2000 mg | ORAL_TABLET | Freq: Two times a day (BID) | ORAL | Status: DC
  Administered 2020-08-04 – 2020-08-09 (×11): 0.2 mg via ORAL
  Filled 2020-08-04 (×11): qty 1

## 2020-08-04 NOTE — Progress Notes (Signed)
Progress Note  Patient Name: Gerald Day Date of Encounter: 08/04/2020  Primary Cardiologist:   Minus Breeding, MD   Subjective   No acute pain or SOB.   Inpatient Medications    Scheduled Meds: . apixaban  5 mg Oral BID  . atorvastatin  10 mg Oral Daily  . cloNIDine  0.1 mg Oral BID  . diltiazem  180 mg Oral Daily  . insulin aspart  0-15 Units Subcutaneous TID WC  . insulin aspart  0-5 Units Subcutaneous QHS  . insulin aspart  4 Units Subcutaneous TID WC  . insulin glargine  25 Units Subcutaneous Daily  . metoprolol succinate  100 mg Oral Daily  . polyethylene glycol  17 g Oral Daily  . senna-docusate  1 tablet Oral BID  . sodium chloride flush  3 mL Intravenous Q12H   Continuous Infusions: .  ceFAZolin (ANCEF) IV 2 g (08/04/20 0553)   PRN Meds: acetaminophen **OR** acetaminophen, hydrALAZINE, ondansetron **OR** ondansetron (ZOFRAN) IV, oxyCODONE   Vital Signs    Vitals:   08/03/20 1846 08/03/20 2025 08/04/20 0531 08/04/20 0957  BP: (!) 154/80 (!) 165/76 (!) 175/77 (!) 167/72  Pulse: 72 68 67 66  Resp: 16 18 18 18   Temp: 99.1 F (37.3 C) 98.8 F (37.1 C) 97.8 F (36.6 C) 98.5 F (36.9 C)  TempSrc: Oral  Oral   SpO2: 96% 98% 96% 98%  Weight:      Height:        Intake/Output Summary (Last 24 hours) at 08/04/2020 1031 Last data filed at 08/04/2020 9371 Gross per 24 hour  Intake 693 ml  Output 1650 ml  Net -957 ml   Filed Weights   07/30/20 2150 08/01/20 2117 08/02/20 0737  Weight: (!) 183.7 kg (!) 183.7 kg (!) 183.7 kg    Telemetry    NSR. - Personally Reviewed  ECG    NA- Personally Reviewed  Physical Exam   GEN: No  acute distress.   Neck: No  JVD Cardiac: RRR, no murmurs, rubs, or gallops.  Respiratory: Clear to auscultation bilaterally. GI: Soft, nontender, non-distended, normal bowel sounds  MS:  Trace edema; No deformity. Neuro:   Nonfocal  Psych: Oriented and appropriate    Labs    Chemistry Recent Labs  Lab  08/02/20 0141 08/03/20 0726 08/04/20 0237  NA 132* 133* 132*  K 4.0 4.0 4.7  CL 101 100 101  CO2 20* 19* 19*  GLUCOSE 301* 247* 354*  BUN 40* 48* 52*  CREATININE 1.96* 2.08* 1.98*  CALCIUM 7.8* 8.1* 7.9*  GFRNONAA 37* 35* 37*  ANIONGAP 11 14 12      Hematology Recent Labs  Lab 08/02/20 0141 08/03/20 0726 08/04/20 0237  WBC 18.7* 18.8* 17.4*  RBC 3.11* 3.28* 3.10*  HGB 9.4* 9.6* 9.1*  HCT 28.8* 30.0* 28.4*  MCV 92.6 91.5 91.6  MCH 30.2 29.3 29.4  MCHC 32.6 32.0 32.0  RDW 14.9 14.8 14.9  PLT 368 424* 352    Cardiac EnzymesNo results for input(s): TROPONINI in the last 168 hours. No results for input(s): TROPIPOC in the last 168 hours.   BNP Recent Labs  Lab 07/29/20 0310  BNP 356.8*     DDimer No results for input(s): DDIMER in the last 168 hours.   Radiology    No results found.  Cardiac Studies   Echo:    1. Left ventricular ejection fraction, by estimation, is 55 to 60%. The left ventricle has normal function. The left ventricle has  no regional wall motion abnormalities. There is mild left ventricular hypertrophy. Indeterminate diastolic filling due to E-A fusion. 2. Right ventricular systolic function was not well visualized. The right ventricular size is not well visualized. 3. Left atrial size was mildly dilated. 4. Right atrial size was mildly dilated. 5. The mitral valve is grossly normal. Trivial mitral valve regurgitation. 6. The aortic valve is tricuspid. Aortic valve regurgitation is not visualized. 7. Aortic dilatation noted. There is mild dilatation of the aortic root, measuring 41 mm. There is mild dilatation of the ascending aorta, measuring 41 mm.  TEE:   1. Left ventricular ejection fraction, by estimation, is 50 to 55%. The  left ventricle has low normal function. The left ventricle has no regional  wall motion abnormalities.  2. Right ventricular systolic function is normal. The right ventricular  size is normal. There is normal  pulmonary artery systolic pressure.  3. No left atrial/left atrial appendage thrombus was detected. The LAA  emptying velocity was 60 cm/s.  4. The mitral valve is normal in structure. Trivial mitral valve  regurgitation. No evidence of mitral stenosis.  5. The aortic valve is normal in structure. Aortic valve regurgitation is  not visualized. No aortic stenosis is present.  6. The inferior vena cava is normal in size with greater than 50%  respiratory variability, suggesting right atrial pressure of 3 mmHg.  7. Evidence of atrial level shunting detected by color flow Doppler.  There is a very small patent foramen ovale with a tiny amount of  bidirectional shunting across atrial septum.   Patient Profile     64 y.o. male with a hx of DM, HTN, HLD, OA, OSA on CPAP, morbid obesity, CHF listed but has not had echo before and never admitted for same, who is being seen for the evaluation of Afib at the request of Dr Kurtis Bushman.  Now status post DCCV.   Assessment & Plan    ATRIAL FLUTTER WITH RVR:   Status post DDCV.   On Eliquis.  Maintaining NSR.     HTN:  Manage in the context of treating his rate.   Consolidated to Toprol XL  BP is running high.  I will increase the clonidine today.    CKD IIIB:   Creat is unchanged.  Follow.  He is on no diuretics or nephrotoxins.   ANEMIA:  Creat is slightly lower than previous.  Seems to be tolerating anticoagulation.      For questions or updates, please contact [place] Please consult [email or web address] for contact info under Cardiology/STEMI.   Signed, Minus Breeding, MD  08/04/2020, 10:31 AM

## 2020-08-04 NOTE — Progress Notes (Signed)
Patient wearing CPAP when RT entered room. No distress noted. RT will monitor as needed.

## 2020-08-04 NOTE — Plan of Care (Signed)
  Problem: Clinical Measurements: Goal: Ability to maintain clinical measurements within normal limits will improve Outcome: Progressing   

## 2020-08-04 NOTE — Anesthesia Postprocedure Evaluation (Signed)
Anesthesia Post Note  Patient: Gerald Day  Procedure(s) Performed: TRANSESOPHAGEAL ECHOCARDIOGRAM (TEE) (N/A ) CARDIOVERSION (N/A )     Patient location during evaluation: Endoscopy Anesthesia Type: General Level of consciousness: awake Pain management: pain level controlled Vital Signs Assessment: post-procedure vital signs reviewed and stable Respiratory status: spontaneous breathing, nonlabored ventilation, respiratory function stable and patient connected to nasal cannula oxygen Cardiovascular status: blood pressure returned to baseline and stable Postop Assessment: no apparent nausea or vomiting Anesthetic complications: no   No complications documented.  Last Vitals:  Vitals:   08/04/20 0957 08/04/20 1539  BP: (!) 167/72 (!) 162/75  Pulse: 66 67  Resp: 18 17  Temp: 36.9 C 36.9 C  SpO2: 98% 97%    Last Pain:  Vitals:   08/04/20 0531  TempSrc: Oral  PainSc:                  Gerald Day

## 2020-08-04 NOTE — Progress Notes (Addendum)
PROGRESS NOTE    RONALDO CRILLY  LNL:[id number] DOB: 08-31-1956 DOA: 07/26/2020 PCP: Biagio Borg, MD   Brief Narrative:  64/M with morbid obesity, OSA, type 2 diabetes mellitus, chronic diastolic CHF, chronic lymphedema was admitted with sepsis, left lower extremity cellulitis complicated by acute kidney injury. -In addition noted to have atrial flutter this hospitalization  Assessment & Plan:   Sepsis secondary to left lower extremity cellulitis -Clinically improving on IV Ancef, appreciate infectious disease input, blood cultures remain negative -MRI/CT without evidence of deep infection, abscess -Continue leg elevation as tolerated -Transition to oral Keflex at discharge -Ambulate, PT OT -SNF recommended for rehabilitation, social work consulted -Discharge planning  Acute gout flare -Had considerable tenderness of both ankles, right MTP joint, uric acid greater than 10 -Known history of gout, acute flare suspected, clinically improved, treated with 3 days of prednisone and colchicine  Atrial flutter with RVR -cards following,  -Remains on Cardizem, metoprolol, DC cardioverted back into sinus rhythm 11/25 -Continue Eliquis  AKI on CKD 3 A -Creatinine was 2.6 on admission, baseline around 1.5 -Likely ATN, now down to 1.9, monitor closely, avoid nephrotoxins  Type 2 diabetes mellitus -Hemoglobin A1c is 7.0,  -CBGs higher in the setting of prednisone, increase Lantus to 25units, continue NovoLog with meals  Morbid obesity OSA -Continue CPAP, needs to lose weight  Hypertension -Continue Norvasc and clonidine   DVT prophylaxis: Lovenox Code Status: Full code Family Communication: None at bedside  Status is: Inpatient  Remains inpatient appropriate because:IV treatments appropriate due to intensity of illness    Dispo: The patient is from: Home              Anticipated d/c is to: SNF              Anticipated d/c date is: 11/29              Patient currently  is not medically stable to d/c   Consultants:   ID  Procedures:  CT foot/tibia/fibula 1. Severe and diffuse skin thickening, subcutaneous soft tissue swelling/edema/fluid and what appear to be wounds along the lateral aspect of the lower extremity. No discrete fluid collection but exam limited without contrast to assess for abscess. 2. No findings suspicious for myofasciitis or pyomyositis. 3. No CT findings suspicious for septic arthritis or osteomyelitis. 4. Moderate-sized knee joint effusion. 5. Age advanced vascular calcifications.   Antimicrobials:   Vancomycin, cefepime--->>dc 11/23  Metronidazole d/c'd Cefazolin start 11/23...>  Subjective: -Feels better today, pain and tenderness in his ankles improving  Objective: Vitals:   08/03/20 1846 08/03/20 2025 08/04/20 0531 08/04/20 0957  BP: (!) 154/80 (!) 165/76 (!) 175/77 (!) 167/72  Pulse: 72 68 67 66  Resp: 16 18 18 18   Temp: 99.1 F (37.3 C) 98.8 F (37.1 C) 97.8 F (36.6 C) 98.5 F (36.9 C)  TempSrc: Oral  Oral   SpO2: 96% 98% 96% 98%  Weight:      Height:        Intake/Output Summary (Last 24 hours) at 08/04/2020 1052 Last data filed at 08/04/2020 4081 Gross per 24 hour  Intake 693 ml  Output 1650 ml  Net -957 ml   Filed Weights   07/30/20 2150 08/01/20 2117 08/02/20 0737  Weight: (!) 183.7 kg (!) 183.7 kg (!) 183.7 kg    Examination: General exam: Morbidly obese x3, no distress HEENT: Neck unable to assess JVD CVS: S1-S2, regular rate rhythm lungs:: Distant breath sounds, otherwise clear , Soft, nontender,  bowel sounds present extremities: Improving erythema over his left lower leg, slightly worse posteriorly, significantly improved tenderness in both ankles and right MTP joint  Psychiatry:  Mood & affect appropriate.      Data Reviewed: I have personally reviewed following labs and imaging studies  CBC: Recent Labs  Lab 07/29/20 0310 08/01/20 0145 08/02/20 0141 08/03/20 0726  08/04/20 0237  WBC 17.2* 17.5* 18.7* 18.8* 17.4*  NEUTROABS  --  14.2*  --   --   --   HGB 9.7* 9.2* 9.4* 9.6* 9.1*  HCT 29.5* 28.7* 28.8* 30.0* 28.4*  MCV 91.3 93.2 92.6 91.5 91.6  PLT 260 343 368 424* 350   Basic Metabolic Panel: Recent Labs  Lab 07/31/20 0455 08/01/20 0145 08/02/20 0141 08/03/20 0726 08/04/20 0237  NA 136 136 132* 133* 132*  K 4.2 4.3 4.0 4.0 4.7  CL 107 104 101 100 101  CO2 18* 21* 20* 19* 19*  GLUCOSE 199* 193* 301* 247* 354*  BUN 32* 32* 40* 48* 52*  CREATININE 1.88* 1.93* 1.96* 2.08* 1.98*  CALCIUM 8.1* 8.0* 7.8* 8.1* 7.9*   GFR: Estimated Creatinine Clearance: 63.1 mL/min (A) (by C-G formula based on SCr of 1.98 mg/dL (H)). Liver Function Tests: No results for input(s): AST, ALT, ALKPHOS, BILITOT, PROT, ALBUMIN in the last 168 hours. No results for input(s): LIPASE, AMYLASE in the last 168 hours. No results for input(s): AMMONIA in the last 168 hours. Coagulation Profile: No results for input(s): INR, PROTIME in the last 168 hours. Cardiac Enzymes: No results for input(s): CKTOTAL, CKMB, CKMBINDEX, TROPONINI in the last 168 hours. BNP (last 3 results) No results for input(s): PROBNP in the last 8760 hours. HbA1C: No results for input(s): HGBA1C in the last 72 hours. CBG: Recent Labs  Lab 08/03/20 0651 08/03/20 1131 08/03/20 1657 08/03/20 2029 08/04/20 0656  GLUCAP 248* 280* 242* 281* 268*   Lipid Profile: No results for input(s): CHOL, HDL, LDLCALC, TRIG, CHOLHDL, LDLDIRECT in the last 72 hours. Thyroid Function Tests: No results for input(s): TSH, T4TOTAL, FREET4, T3FREE, THYROIDAB in the last 72 hours. Anemia Panel: No results for input(s): VITAMINB12, FOLATE, FERRITIN, TIBC, IRON, RETICCTPCT in the last 72 hours. Sepsis Labs: No results for input(s): PROCALCITON, LATICACIDVEN in the last 168 hours.  Recent Results (from the past 240 hour(s))  Blood Culture (routine x 2)     Status: None   Collection Time: 07/26/20  2:50 PM    Specimen: BLOOD  Result Value Ref Range Status   Specimen Description BLOOD RIGHT ANTECUBITAL  Final   Special Requests   Final    BLOOD Blood Culture results may not be optimal due to an inadequate volume of blood received in culture bottles   Culture   Final    NO GROWTH 5 DAYS Performed at [facility], [place] [address], [place], [place] [address]    Report Status 07/31/2020 FINAL  Final  Blood Culture (routine x 2)     Status: None   Collection Time: 07/26/20  3:46 PM   Specimen: BLOOD  Result Value Ref Range Status   Specimen Description BLOOD SITE NOT SPECIFIED  Final   Special Requests   Final    BOTTLES DRAWN AEROBIC AND ANAEROBIC Blood Culture adequate volume   Culture   Final    NO GROWTH 5 DAYS Performed at [facility], [place] [address], [place], [place] [address]    Report Status 07/31/2020 FINAL  Final  Respiratory Panel by RT PCR (  Flu A&B, Covid) - Nasopharyngeal Swab     Status: None   Collection Time: 07/26/20  5:54 PM   Specimen: Nasopharyngeal Swab; Nasopharyngeal(NP) swabs in vial transport medium  Result Value Ref Range Status   SARS Coronavirus 2 by RT PCR NEGATIVE NEGATIVE Final    Comment: (NOTE) SARS-CoV-2 target nucleic acids are NOT DETECTED.  The SARS-CoV-2 RNA is generally detectable in upper respiratoy specimens during the acute phase of infection. The lowest concentration of SARS-CoV-2 viral copies this assay can detect is 131 copies/mL. A negative result does not preclude SARS-Cov-2 infection and should not be used as the sole basis for treatment or other patient management decisions. A negative result may occur with  improper specimen collection/handling, submission of specimen other than nasopharyngeal swab, presence of viral mutation(s) within the areas targeted by this assay, and inadequate number of viral copies (<131 copies/mL). A negative result must be combined with clinical observations, patient history, and epidemiological  information. The expected result is Negative.  Fact Sheet for Patients:  PinkCheek.be  Fact Sheet for Healthcare Providers:  GravelBags.it  This test is no t yet approved or cleared by the [place] FDA and  has been authorized for detection and/or diagnosis of SARS-CoV-2 by FDA under an Emergency Use Authorization (EUA). This EUA will remain  in effect (meaning this test can be used) for the duration of the COVID-19 declaration under Section 564(b)(1) of the Act, 21 U.S.C. section 360bbb-3(b)(1), unless the authorization is terminated or revoked sooner.     Influenza A by PCR NEGATIVE NEGATIVE Final   Influenza B by PCR NEGATIVE NEGATIVE Final    Comment: (NOTE) The Xpert Xpress SARS-CoV-2/FLU/RSV assay is intended as an aid in  the diagnosis of influenza from Nasopharyngeal swab specimens and  should not be used as a sole basis for treatment. Nasal washings and  aspirates are unacceptable for Xpert Xpress SARS-CoV-2/FLU/RSV  testing.  Fact Sheet for Patients: PinkCheek.be  Fact Sheet for Healthcare Providers: GravelBags.it  This test is not yet approved or cleared by the [place] FDA and  has been authorized for detection and/or diagnosis of SARS-CoV-2 by  FDA under an Emergency Use Authorization (EUA). This EUA will remain  in effect (meaning this test can be used) for the duration of the  Covid-19 declaration under Section 564(b)(1) of the Act, 21  U.S.C. section 360bbb-3(b)(1), unless the authorization is  terminated or revoked. Performed at [facility], [place] [address], [place], [place] [address]   Urine Culture     Status: None   Collection Time: 07/27/20  1:45 PM   Specimen: Urine, Random  Result Value Ref Range Status   Specimen Description URINE, RANDOM  Final   Special Requests NONE  Final   Culture   Final    NO  GROWTH Performed at [facility], [place] [address] [place], [place] [address]    Report Status 07/28/2020 FINAL  Final  Culture, blood (routine x 2)     Status: None   Collection Time: 07/29/20  5:52 PM   Specimen: BLOOD  Result Value Ref Range Status   Specimen Description BLOOD LEFT ANTECUBITAL  Final   Special Requests   Final    BOTTLES DRAWN AEROBIC AND ANAEROBIC Blood Culture results may not be optimal due to an excessive volume of blood received in culture bottles   Culture   Final    NO GROWTH 5 DAYS Performed at [facility], [place]  [address] [place], [place] [address]    Report Status 08/03/2020 FINAL  Final  Culture, blood (routine x 2)     Status: None   Collection Time: 07/29/20  5:57 PM   Specimen: BLOOD  Result Value Ref Range Status   Specimen Description BLOOD RIGHT ANTECUBITAL  Final   Special Requests   Final    BOTTLES DRAWN AEROBIC AND ANAEROBIC Blood Culture results may not be optimal due to an excessive volume of blood received in culture bottles   Culture   Final    NO GROWTH 5 DAYS Performed at [facility], [place] [address], [place], [place] [address]    Report Status 08/03/2020 FINAL  Final  Urine Culture     Status: None   Collection Time: 07/30/20 12:22 PM   Specimen: Urine, Random  Result Value Ref Range Status   Specimen Description URINE, RANDOM  Final   Special Requests NONE  Final   Culture   Final    NO GROWTH Performed at [facility], [place] [address], [place],  [address]    Report Status 07/31/2020 FINAL  Final      Scheduled Meds: . apixaban  5 mg Oral BID  . atorvastatin  10 mg Oral Daily  . cloNIDine  0.2 mg Oral BID  . diltiazem  180 mg Oral Daily  . insulin aspart  0-15 Units Subcutaneous TID WC  . insulin aspart  0-5 Units Subcutaneous QHS  . insulin aspart  4 Units Subcutaneous TID WC  . insulin glargine  25 Units Subcutaneous Daily  . metoprolol succinate  100 mg Oral Daily  . polyethylene  glycol  17 g Oral Daily  . senna-docusate  1 tablet Oral BID  . sodium chloride flush  3 mL Intravenous Q12H   Continuous Infusions: .  ceFAZolin (ANCEF) IV 2 g (08/04/20 0553)     LOS: 9 days   Time spent: 4min  Domenic Polite, MD [facility]11/28/2021, 10:52 AM

## 2020-08-04 NOTE — Telephone Encounter (Signed)
Please refill as per office routine med refill policy (all routine meds refilled for 3 mo or monthly per pt preference up to one year from last visit, then month to month grace period for 3 mo, then further med refills will have to be denied)  

## 2020-08-05 DIAGNOSIS — L0390 Cellulitis, unspecified: Secondary | ICD-10-CM | POA: Diagnosis not present

## 2020-08-05 DIAGNOSIS — I484 Atypical atrial flutter: Secondary | ICD-10-CM | POA: Diagnosis not present

## 2020-08-05 DIAGNOSIS — I10 Essential (primary) hypertension: Secondary | ICD-10-CM | POA: Diagnosis not present

## 2020-08-05 DIAGNOSIS — E1169 Type 2 diabetes mellitus with other specified complication: Secondary | ICD-10-CM | POA: Diagnosis not present

## 2020-08-05 DIAGNOSIS — A419 Sepsis, unspecified organism: Secondary | ICD-10-CM | POA: Diagnosis not present

## 2020-08-05 LAB — CBC
HCT: 30.5 % — ABNORMAL LOW (ref 39.0–52.0)
Hemoglobin: 9.6 g/dL — ABNORMAL LOW (ref 13.0–17.0)
MCH: 28.9 pg (ref 26.0–34.0)
MCHC: 31.5 g/dL (ref 30.0–36.0)
MCV: 91.9 fL (ref 80.0–100.0)
Platelets: 392 10*3/uL (ref 150–400)
RBC: 3.32 MIL/uL — ABNORMAL LOW (ref 4.22–5.81)
RDW: 14.8 % (ref 11.5–15.5)
WBC: 18.3 10*3/uL — ABNORMAL HIGH (ref 4.0–10.5)
nRBC: 0 % (ref 0.0–0.2)

## 2020-08-05 LAB — BASIC METABOLIC PANEL
Anion gap: 13 (ref 5–15)
BUN: 46 mg/dL — ABNORMAL HIGH (ref 8–23)
CO2: 18 mmol/L — ABNORMAL LOW (ref 22–32)
Calcium: 7.9 mg/dL — ABNORMAL LOW (ref 8.9–10.3)
Chloride: 102 mmol/L (ref 98–111)
Creatinine, Ser: 1.84 mg/dL — ABNORMAL HIGH (ref 0.61–1.24)
GFR, Estimated: 40 mL/min — ABNORMAL LOW (ref >60)
Glucose, Bld: 302 mg/dL — ABNORMAL HIGH (ref 70–99)
Potassium: 4.7 mmol/L (ref 3.5–5.1)
Sodium: 133 mmol/L — ABNORMAL LOW (ref 135–145)

## 2020-08-05 LAB — GLUCOSE, CAPILLARY
Glucose-Capillary: 239 mg/dL — ABNORMAL HIGH (ref 70–99)
Glucose-Capillary: 351 mg/dL — ABNORMAL HIGH (ref 70–99)
Glucose-Capillary: 227 mg/dL — ABNORMAL HIGH (ref 70–99)
Glucose-Capillary: 210 mg/dL — ABNORMAL HIGH (ref 70–99)

## 2020-08-05 MED ORDER — INSULIN GLARGINE 100 UNIT/ML ~~LOC~~ SOLN
30.0000 [IU] | Freq: Every day | SUBCUTANEOUS | Status: DC
  Administered 2020-08-06 – 2020-08-07 (×2): 30 [IU] via SUBCUTANEOUS
  Filled 2020-08-05 (×2): qty 0.3

## 2020-08-05 MED ORDER — SODIUM CHLORIDE 0.9 % IV SOLN
INTRAVENOUS | Status: DC | PRN
  Administered 2020-08-05: 250 mL via INTRAVENOUS

## 2020-08-05 MED ORDER — INSULIN ASPART 100 UNIT/ML ~~LOC~~ SOLN
5.0000 [IU] | Freq: Three times a day (TID) | SUBCUTANEOUS | Status: DC
  Administered 2020-08-05 – 2020-08-09 (×13): 5 [IU] via SUBCUTANEOUS

## 2020-08-05 MED ORDER — CARVEDILOL 12.5 MG PO TABS
12.5000 mg | ORAL_TABLET | Freq: Two times a day (BID) | ORAL | Status: DC
  Administered 2020-08-05 – 2020-08-09 (×9): 12.5 mg via ORAL
  Filled 2020-08-05 (×9): qty 1

## 2020-08-05 NOTE — Care Management Important Message (Signed)
Important Message  Patient Details  Name: Gerald Day MRN: [id number] Date of Birth: Apr 30, 1956   Medicare Important Message Given:  Yes     Kevonna Nolte P [place] 08/05/2020, 3:30 PM

## 2020-08-05 NOTE — Progress Notes (Addendum)
Progress Note  Patient Name: Gerald Day Date of Encounter: 08/05/2020  [facility] Cardiologist: Minus Breeding, MD   Subjective   Wants to go home. No chest pain or dyspnea.   Inpatient Medications    Scheduled Meds: . apixaban  5 mg Oral BID  . atorvastatin  10 mg Oral Daily  . cloNIDine  0.2 mg Oral BID  . diltiazem  180 mg Oral Daily  . insulin aspart  0-15 Units Subcutaneous TID WC  . insulin aspart  0-5 Units Subcutaneous QHS  . insulin aspart  4 Units Subcutaneous TID WC  . insulin glargine  25 Units Subcutaneous Daily  . metoprolol succinate  100 mg Oral Daily  . polyethylene glycol  17 g Oral Daily  . senna-docusate  1 tablet Oral BID  . sodium chloride flush  3 mL Intravenous Q12H   Continuous Infusions: .  ceFAZolin (ANCEF) IV 2 g (08/05/20 0556)   PRN Meds: acetaminophen **OR** acetaminophen, hydrALAZINE, ondansetron **OR** ondansetron (ZOFRAN) IV, oxyCODONE   Vital Signs    Vitals:   08/04/20 2048 08/04/20 2130 08/05/20 0548 08/05/20 0926  BP: (!) 174/66  (!) 172/82 (!) 189/73  Pulse: 68 70 77 79  Resp: 20 16 19 18   Temp: 98.5 F (36.9 C)  99.6 F (37.6 C) 100.3 F (37.9 C)  TempSrc: Oral  Oral   SpO2: 95% 95% 95% 95%  Weight:      Height:        Intake/Output Summary (Last 24 hours) at 08/05/2020 1020 Last data filed at 08/05/2020 0800 Gross per 24 hour  Intake 598.32 ml  Output 2500 ml  Net -1901.68 ml   Last 3 Weights 08/02/2020 08/01/2020 07/30/2020  Weight (lbs) 404 lb 15.8 oz 404 lb 15.9 oz 404 lb 15.8 oz  Weight (kg) 183.7 kg 183.704 kg 183.7 kg      Telemetry    NSR - Personally Reviewed  ECG    N/A  Physical Exam   GEN: No acute distress.   Neck: No JVD Cardiac: RRR, no murmurs, rubs, or gallops.  Respiratory: Clear to auscultation bilaterally. GI: Soft, nontender, non-distended  TK:ZSWFU bilateral edema and ACE wrap on LLE. No deformity. Neuro:  Nonfocal  Psych: Normal affect   Labs    High  Sensitivity Troponin:   Recent Labs  Lab 07/26/20 1329  TROPONINIHS 28*      Chemistry Recent Labs  Lab 08/03/20 0726 08/04/20 0237 08/05/20 0245  NA 133* 132* 133*  K 4.0 4.7 4.7  CL 100 101 102  CO2 19* 19* 18*  GLUCOSE 247* 354* 302*  BUN 48* 52* 46*  CREATININE 2.08* 1.98* 1.84*  CALCIUM 8.1* 7.9* 7.9*  GFRNONAA 35* 37* 40*  ANIONGAP 14 12 13      Hematology Recent Labs  Lab 08/03/20 0726 08/04/20 0237 08/05/20 0245  WBC 18.8* 17.4* 18.3*  RBC 3.28* 3.10* 3.32*  HGB 9.6* 9.1* 9.6*  HCT 30.0* 28.4* 30.5*  MCV 91.5 91.6 91.9  MCH 29.3 29.4 28.9  MCHC 32.0 32.0 31.5  RDW 14.8 14.9 14.8  PLT 424* 352 392    Radiology    No results found.  Cardiac Studies   TEE/DCCV 08/02/2020  Cardioverted 1 time(s).  Cardioversion with synchronized biphasic 200J shock.  Evaluation: Findings: Post procedure EKG shows: NSR with PVCs Complications: None Patient did tolerate procedure well.   1. Left ventricular ejection fraction, by estimation, is 50 to 55%. The  left ventricle has low normal function. The  left ventricle has no regional  wall motion abnormalities.  2. Right ventricular systolic function is normal. The right ventricular  size is normal. There is normal pulmonary artery systolic pressure.  3. No left atrial/left atrial appendage thrombus was detected. The LAA  emptying velocity was 60 cm/s.  4. The mitral valve is normal in structure. Trivial mitral valve  regurgitation. No evidence of mitral stenosis.  5. The aortic valve is normal in structure. Aortic valve regurgitation is  not visualized. No aortic stenosis is present.  6. The inferior vena cava is normal in size with greater than 50%  respiratory variability, suggesting right atrial pressure of 3 mmHg.  7. Evidence of atrial level shunting detected by color flow Doppler.  There is a very small patent foramen ovale with a tiny amount of  bidirectional shunting across atrial septum.    Echo 07/29/2020 1. Left ventricular ejection fraction, by estimation, is 55 to 60%. The  left ventricle has normal function. The left ventricle has no regional  wall motion abnormalities. There is mild left ventricular hypertrophy.  Indeterminate diastolic filling due to  E-A fusion.  2. Right ventricular systolic function was not well visualized. The right  ventricular size is not well visualized.  3. Left atrial size was mildly dilated.  4. Right atrial size was mildly dilated.  5. The mitral valve is grossly normal. Trivial mitral valve  regurgitation.  6. The aortic valve is tricuspid. Aortic valve regurgitation is not  visualized.  7. Aortic dilatation noted. There is mild dilatation of the aortic root,  measuring 41 mm. There is mild dilatation of the ascending aorta,  measuring 41 mm.   Patient Profile     64 y.o. male with a hx of DM, HTN, HLD, OA, OSA on CPAP, morbid obesity, CHF listed but has not had echo before and never admitted for same,who is being seen for the evaluation of Afibat the request of Dr Kurtis Bushman  Assessment & Plan    1. Atrial flutter with RVR -Initially treated with oral beta-blocker and Cardizem.  Discontinued Norvasc.  Eventually underwent successful TEE cardioversion on November 26.  Maintaining sinus rhythm at controlled rate.  On Eliquis for anticoagulation.  2.  Hypertension -Blood pressure remains elevated -Norvasc discontinued initially to add Cardizem for better rate control -Will review medication with MD -Currently on Toprol-XL 100 mg daily, Cardizem CD 180 mg daily and clonidine 0.2 mg twice daily -May change Toprol to Coreg or Cardizem back to Norvasc and uptitrating at the rate control agent  3.  Left lower extremity cellulitis/sepsis -Per primary team      For questions or updates, please contact [place] Please consult [email or web address] for contact info under        SignedLeanor Kail, PA  08/05/2020, 10:20  AM    Patient seen and examined with [facility] PA.  Agree as above, with the following exceptions and changes as noted below. Feeling well today. Feels his BP elevation is related to being in the hospital. Gen: NAD, CV: RRR, no murmurs, Lungs: clear, Abd: soft, Extrem: diffuse bilateral edema, mild, Neuro/Psych: alert and oriented x 3, normal mood and affect. All available labs, radiology testing, previous records reviewed. Will transition metoprolol succinate to carvedilol for better BP control. Encouraged patient to record BP at home daily, which he already does.  Elouise Munroe, MD 08/05/20 12:18 PM

## 2020-08-05 NOTE — Progress Notes (Signed)
PROGRESS NOTE    Gerald Day  YOV:[id number] DOB: 11/02/1955 DOA: 07/26/2020 PCP: Biagio Borg, MD   Brief Narrative:  64/M with morbid obesity, OSA, type 2 diabetes mellitus, chronic diastolic CHF, chronic lymphedema was admitted with sepsis, left lower extremity cellulitis complicated by acute kidney injury. -In addition noted to have atrial flutter this hospitalization, cardiology consulted underwent cardioversion  Assessment & Plan:   Sepsis secondary to left lower extremity cellulitis -Clinically improving on IV Ancef, appreciate infectious disease input, blood cultures remain negative -MRI/CT without evidence of deep infection, abscess -Continue leg elevation as tolerated -White count remains elevated part of this is from recent steroids for gout I suspect, continue to monitor -PT OT eval completed, SNF recommended for rehabilitation -Discharge planning  Acute gout flare -Had considerable tenderness of both ankles, right MTP joint, uric acid greater than 10 -Known history of gout, acute flare suspected, clinically improved, treated with 3 days of prednisone and colchicine, then discontinued  Atrial flutter with RVR -cards following,  -Remains on Cardizem, metoprolol, DC cardioverted back into sinus rhythm 11/25 -Continue Eliquis  Chronic diastolic CHF -Diuretics have been on hold due to AKI -Kidney function stabilizing, will need to resume diuretics in 1 to 2 days  AKI on CKD 3 A -Creatinine was 2.6 on admission, baseline around 1.5 -Likely ATN, now down to 1.8,  monitor closely, avoid nephrotoxins  Type 2 diabetes mellitus -Hemoglobin A1c is 7.0,  -CBGs higher in the setting of prednisone, increase Lantus to 25units, continue NovoLog with meals  Morbid obesity OSA -Continue CPAP, needs to lose weight  Hypertension -Continue Norvasc and clonidine   DVT prophylaxis: Lovenox Code Status: Full code Family Communication: None at bedside  Status is:  Inpatient  Remains inpatient appropriate because:IV treatments appropriate due to intensity of illness    Dispo: The patient is from: Home              Anticipated d/c is to: SNF              Anticipated d/c date is: When SNF bed is available              Patient currently is not medically stable to d/c   Consultants:   ID  Procedures:  CT foot/tibia/fibula 1. Severe and diffuse skin thickening, subcutaneous soft tissue swelling/edema/fluid and what appear to be wounds along the lateral aspect of the lower extremity. No discrete fluid collection but exam limited without contrast to assess for abscess. 2. No findings suspicious for myofasciitis or pyomyositis. 3. No CT findings suspicious for septic arthritis or osteomyelitis. 4. Moderate-sized knee joint effusion. 5. Age advanced vascular calcifications.   Antimicrobials:   Vancomycin, cefepime--->>dc 11/23  Metronidazole d/c'd Cefazolin start 11/23...>  Subjective: -Feels better overall, pain and tenderness in his legs and ankles have improved -, Wants to go home but realizes he is not able to walk  Objective: Vitals:   08/04/20 2048 08/04/20 2130 08/05/20 0548 08/05/20 0926  BP: (!) 174/66  (!) 172/82 (!) 189/73  Pulse: 68 70 77 79  Resp: 20 16 19 18   Temp: 98.5 F (36.9 C)  99.6 F (37.6 C) 100.3 F (37.9 C)  TempSrc: Oral  Oral   SpO2: 95% 95% 95% 95%  Weight:      Height:        Intake/Output Summary (Last 24 hours) at 08/05/2020 1127 Last data filed at 08/05/2020 0800 Gross per 24 hour  Intake 598.32 ml  Output 2500 ml  Net -1901.68 ml   Filed Weights   07/30/20 2150 08/01/20 2117 08/02/20 0737  Weight: (!) 183.7 kg (!) 183.7 kg (!) 183.7 kg    Examination: General exam: Morbidly obese male laying in bed, AAOx3, no distress HEENT: Neck obese unable to assess JVD CVS: S1-S2, regular rate rhythm Lungs: Distant breath sounds, otherwise clear Abdomen: Soft, nontender, bowel sounds  present Extremities: Improving erythema over his left lower leg, slightly increased erythema posteriorly, improved tenderness in both ankles and right MTP joint  Psychiatry:  Mood & affect appropriate.   Data Reviewed: I have personally reviewed following labs and imaging studies  CBC: Recent Labs  Lab 08/01/20 0145 08/02/20 0141 08/03/20 0726 08/04/20 0237 08/05/20 0245  WBC 17.5* 18.7* 18.8* 17.4* 18.3*  NEUTROABS 14.2*  --   --   --   --   HGB 9.2* 9.4* 9.6* 9.1* 9.6*  HCT 28.7* 28.8* 30.0* 28.4* 30.5*  MCV 93.2 92.6 91.5 91.6 91.9  PLT 343 368 424* 352 150   Basic Metabolic Panel: Recent Labs  Lab 08/01/20 0145 08/02/20 0141 08/03/20 0726 08/04/20 0237 08/05/20 0245  NA 136 132* 133* 132* 133*  K 4.3 4.0 4.0 4.7 4.7  CL 104 101 100 101 102  CO2 21* 20* 19* 19* 18*  GLUCOSE 193* 301* 247* 354* 302*  BUN 32* 40* 48* 52* 46*  CREATININE 1.93* 1.96* 2.08* 1.98* 1.84*  CALCIUM 8.0* 7.8* 8.1* 7.9* 7.9*   GFR: Estimated Creatinine Clearance: 67.9 mL/min (A) (by C-G formula based on SCr of 1.84 mg/dL (H)). Liver Function Tests: No results for input(s): AST, ALT, ALKPHOS, BILITOT, PROT, ALBUMIN in the last 168 hours. No results for input(s): LIPASE, AMYLASE in the last 168 hours. No results for input(s): AMMONIA in the last 168 hours. Coagulation Profile: No results for input(s): INR, PROTIME in the last 168 hours. Cardiac Enzymes: No results for input(s): CKTOTAL, CKMB, CKMBINDEX, TROPONINI in the last 168 hours. BNP (last 3 results) No results for input(s): PROBNP in the last 8760 hours. HbA1C: No results for input(s): HGBA1C in the last 72 hours. CBG: Recent Labs  Lab 08/04/20 0656 08/04/20 1128 08/04/20 1622 08/04/20 2121 08/05/20 0641  GLUCAP 268* 266* 204* 259* 239*   Lipid Profile: No results for input(s): CHOL, HDL, LDLCALC, TRIG, CHOLHDL, LDLDIRECT in the last 72 hours. Thyroid Function Tests: No results for input(s): TSH, T4TOTAL, FREET4, T3FREE,  THYROIDAB in the last 72 hours. Anemia Panel: No results for input(s): VITAMINB12, FOLATE, FERRITIN, TIBC, IRON, RETICCTPCT in the last 72 hours. Sepsis Labs: No results for input(s): PROCALCITON, LATICACIDVEN in the last 168 hours.  Recent Results (from the past 240 hour(s))  Blood Culture (routine x 2)     Status: None   Collection Time: 07/26/20  2:50 PM   Specimen: BLOOD  Result Value Ref Range Status   Specimen Description BLOOD RIGHT ANTECUBITAL  Final   Special Requests   Final    BLOOD Blood Culture results may not be optimal due to an inadequate volume of blood received in culture bottles   Culture   Final    NO GROWTH 5 DAYS Performed at [facility], [place] [address], [place], [place] [address]    Report Status 07/31/2020 FINAL  Final  Blood Culture (routine x 2)     Status: None   Collection Time: 07/26/20  3:46 PM   Specimen: BLOOD  Result Value Ref Range Status   Specimen Description BLOOD SITE NOT SPECIFIED  Final  Special Requests   Final    BOTTLES DRAWN AEROBIC AND ANAEROBIC Blood Culture adequate volume   Culture   Final    NO GROWTH 5 DAYS Performed at [facility], [place] [address], [place], [place] [address]    Report Status 07/31/2020 FINAL  Final  Respiratory Panel by RT PCR (Flu A&B, Covid) - Nasopharyngeal Swab     Status: None   Collection Time: 07/26/20  5:54 PM   Specimen: Nasopharyngeal Swab; Nasopharyngeal(NP) swabs in vial transport medium  Result Value Ref Range Status   SARS Coronavirus 2 by RT PCR NEGATIVE NEGATIVE Final    Comment: (NOTE) SARS-CoV-2 target nucleic acids are NOT DETECTED.  The SARS-CoV-2 RNA is generally detectable in upper respiratoy specimens during the acute phase of infection. The lowest concentration of SARS-CoV-2 viral copies this assay can detect is 131 copies/mL. A negative result does not preclude SARS-Cov-2 infection and should not be used as the sole basis for treatment or other patient management  decisions. A negative result may occur with  improper specimen collection/handling, submission of specimen other than nasopharyngeal swab, presence of viral mutation(s) within the areas targeted by this assay, and inadequate number of viral copies (<131 copies/mL). A negative result must be combined with clinical observations, patient history, and epidemiological information. The expected result is Negative.  Fact Sheet for Patients:  PinkCheek.be  Fact Sheet for Healthcare Providers:  GravelBags.it  This test is no t yet approved or cleared by the [place] FDA and  has been authorized for detection and/or diagnosis of SARS-CoV-2 by FDA under an Emergency Use Authorization (EUA). This EUA will remain  in effect (meaning this test can be used) for the duration of the COVID-19 declaration under Section 564(b)(1) of the Act, 21 U.S.C. section 360bbb-3(b)(1), unless the authorization is terminated or revoked sooner.     Influenza A by PCR NEGATIVE NEGATIVE Final   Influenza B by PCR NEGATIVE NEGATIVE Final    Comment: (NOTE) The Xpert Xpress SARS-CoV-2/FLU/RSV assay is intended as an aid in  the diagnosis of influenza from Nasopharyngeal swab specimens and  should not be used as a sole basis for treatment. Nasal washings and  aspirates are unacceptable for Xpert Xpress SARS-CoV-2/FLU/RSV  testing.  Fact Sheet for Patients: PinkCheek.be  Fact Sheet for Healthcare Providers: GravelBags.it  This test is not yet approved or cleared by the [place] FDA and  has been authorized for detection and/or diagnosis of SARS-CoV-2 by  FDA under an Emergency Use Authorization (EUA). This EUA will remain  in effect (meaning this test can be used) for the duration of the  Covid-19 declaration under Section 564(b)(1) of the Act, 21  U.S.C. section 360bbb-3(b)(1), unless the  authorization is  terminated or revoked. Performed at [facility], [place] [address] [place], [place] [address]   Urine Culture     Status: None   Collection Time: 07/27/20  1:45 PM   Specimen: Urine, Random  Result Value Ref Range Status   Specimen Description URINE, RANDOM  Final   Special Requests NONE  Final   Culture   Final    NO GROWTH Performed at [facility], [place] [address] [place], [place] [address]    Report Status 07/28/2020 FINAL  Final  Culture, blood (routine x 2)     Status: None   Collection Time: 07/29/20  5:52 PM   Specimen: BLOOD  Result Value Ref Range Status   Specimen Description BLOOD LEFT  ANTECUBITAL  Final   Special Requests   Final    BOTTLES DRAWN AEROBIC AND ANAEROBIC Blood Culture results may not be optimal due to an excessive volume of blood received in culture bottles   Culture   Final    NO GROWTH 5 DAYS Performed at [facility] [address] [place], [place] [address]    Report Status 08/03/2020 FINAL  Final  Culture, blood (routine x 2)     Status: None   Collection Time: 07/29/20  5:57 PM   Specimen: BLOOD  Result Value Ref Range Status   Specimen Description BLOOD RIGHT ANTECUBITAL  Final   Special Requests   Final    BOTTLES DRAWN AEROBIC AND ANAEROBIC Blood Culture results may not be optimal due to an excessive volume of blood received in culture bottles   Culture   Final    NO GROWTH 5 DAYS Performed at [facility], [place] [address] [place], [place] [address]    Report Status 08/03/2020 FINAL  Final  Urine Culture     Status: None   Collection Time: 07/30/20 12:22 PM   Specimen: Urine, Random  Result Value Ref Range Status   Specimen Description URINE, RANDOM  Final   Special Requests NONE  Final   Culture   Final    NO GROWTH Performed at [facility], [place] [address], [place], [place] [address]    Report Status 07/31/2020 FINAL  Final      Scheduled Meds: . apixaban  5 mg Oral BID  .  atorvastatin  10 mg Oral Daily  . cloNIDine  0.2 mg Oral BID  . diltiazem  180 mg Oral Daily  . insulin aspart  0-15 Units Subcutaneous TID WC  . insulin aspart  0-5 Units Subcutaneous QHS  . insulin aspart  4 Units Subcutaneous TID WC  . insulin glargine  25 Units Subcutaneous Daily  . metoprolol succinate  100 mg Oral Daily  . polyethylene glycol  17 g Oral Daily  . senna-docusate  1 tablet Oral BID  . sodium chloride flush  3 mL Intravenous Q12H   Continuous Infusions: .  ceFAZolin (ANCEF) IV 2 g (08/05/20 0556)     LOS: 10 days   Time spent: 18min  Domenic Polite, MD [facility]11/29/2021, 11:27 AM

## 2020-08-05 NOTE — Progress Notes (Signed)
ANTICOAGULATION CONSULT NOTE - Follow Up Consult  Pharmacy Consult for Eliquis  Indication: Nonvalvular Atrial fibrillation   Patient Measurements: Height: 6' (182.9 cm) Weight: (!) 183.7 kg (404 lb 15.8 oz) IBW/kg (Calculated) : 77.6 Heparin Dosing Weight:   Vital Signs: Temp: 100.3 F (37.9 C) (11/29 0926) Temp Source: Oral (11/29 0548) BP: 189/73 (11/29 0926) Pulse Rate: 79 (11/29 0926)  Labs: Recent Labs    08/03/20 0726 08/03/20 0726 08/04/20 0237 08/05/20 0245  HGB 9.6*   < > 9.1* 9.6*  HCT 30.0*  --  28.4* 30.5*  PLT 424*  --  352 392  CREATININE 2.08*  --  1.98* 1.84*   < > = values in this interval not displayed.    Estimated Creatinine Clearance: 67.9 mL/min (A) (by C-G formula based on SCr of 1.84 mg/dL (H)).   Medical History: Past Medical History:  Diagnosis Date  . Arthritis    IN KNEES  . Cellulitis 10/26/2013   LOWER RT EXTREMITY  . CELLULITIS/ABSCESS, LEG 06/27/2007  . COLONIC POLYPS   . CONGESTIVE HEART FAILURE   . DIABETES MELLITUS, TYPE II   . DIVERTICULOSIS, COLON   . ERECTILE DYSFUNCTION, ORGANIC   . GOUT NOS   . Headache   . HYPERLIPIDEMIA   . HYPERTENSION   . LOW BACK PAIN   . Morbid obesity ([place])   . SLEEP APNEA, OBSTRUCTIVE    USES CPAP    Medications:  Medications Prior to Admission  Medication Sig Dispense Refill Last Dose  . amLODipine (NORVASC) 10 MG tablet Take 1 tablet (10 mg total) by mouth daily. 90 tablet 3 07/26/2020 at Unknown time  . atorvastatin (LIPITOR) 10 MG tablet TAKE 1 TABLET (10 MG TOTAL) BY MOUTH DAILY. 90 tablet 3 07/26/2020 at Unknown time  . Cholecalciferol (THERA-D 2000) 50 MCG (2000 UT) TABS 1 tab by mouth once daily 90 tablet 3 07/26/2020 at Unknown time  . cloNIDine (CATAPRES) 0.3 MG tablet Take 0.3 mg by mouth in the morning, at noon, and at bedtime.   07/26/2020 at Unknown time  . furosemide (LASIX) 80 MG tablet Take 1 tablet (80 mg total) by mouth daily as needed. 90 tablet 3 07/25/2020 at Unknown  time  . glipiZIDE (GLUCOTROL XL) 5 MG 24 hr tablet TAKE 1 TABLET BY MOUTH EVERY DAY WITH BREAKFAST 90 tablet 3 07/26/2020 at Unknown time  . indomethacin (INDOCIN) 50 MG capsule TAKE 1 CAPSULE (50 MG TOTAL) BY MOUTH 3 (THREE) TIMES DAILY AS NEEDED. (Patient taking differently: Take 50 mg by mouth 3 (three) times daily as needed (gout). ) 90 capsule 1 Past Month at Unknown time  . metFORMIN (GLUCOPHAGE) 500 MG tablet Take 2 tablets (1,000 mg total) by mouth 2 (two) times daily with a meal. 360 tablet 3 07/26/2020 at Unknown time  . potassium chloride SA (KLOR-CON M20) 20 MEQ tablet Take 1 tablet (20 mEq total) by mouth daily. 90 tablet 3 07/26/2020 at Unknown time  . telmisartan (MICARDIS) 80 MG tablet Take 1 tablet (80 mg total) by mouth daily. 90 tablet 3 07/26/2020 at Unknown time  . vitamin B-12 (CYANOCOBALAMIN) 1000 MCG tablet Take 1 tablet (1,000 mcg total) by mouth daily. 90 tablet 3 07/26/2020 at Unknown time    Assessment: 64 YOM transitioned to Apixaban this admission for anticoagulation in the setting of nonvalvular Atrial fibrilation. Pharmacy consulted to dose.   The patient's current dose of 5 mg bid is appropriate for age<104 years old, wt>60kg. No adjustments needed at this time.  Goal of Therapy:  Appropriate anticoagulation for indication and hepatic/renal function  Monitor platelets by anticoagulation protocol: Yes   Plan:  - Continue Apixaban 5 mg bid - AVS in chart, education done 11/29 - Pharmacy will sign off of consult and monitor peripherally  Thank you for allowing pharmacy to be a part of this patient's care.  Alycia Rossetti, PharmD, BCPS Clinical Pharmacist Clinical phone for 08/05/2020: [phone] 08/05/2020 11:51 AM   **Pharmacist phone directory can now be found on [email or web address] ([email or web address]).  Listed under [place].

## 2020-08-05 NOTE — Progress Notes (Signed)
Physical Therapy Treatment Patient Details Name: Gerald Day MRN: [id number] DOB: July 31, 1956 Today's Date: 08/05/2020    History of Present Illness Pt is 64 yo male with PMH including OSA on CPAP, morbid obesity, HTN, HLD, DM, CHF, cellulitis, and pt reports hx of gout.  He presented with L LE cellulitis, LE edema, and shortness of breath.  Pt admitted with sepsis due to cellulitis.  Pt additionally with aflutter with RVR, cardiology consult, and possible TEE on Friday.    PT Comments    Patient progressing toward goals this session able to perform sit to stand successfully with +2-3 assist to RW.  Not well tolerated due to knee pain sitting with uncontrolled descent on EOB.  Remains appropriate for SNF level rehab due to living alone and unable to mobilize independently.  PT to continue to follow acutely.    Follow Up Recommendations  SNF;Supervision/Assistance - 24 hour     Equipment Recommendations  3in1 (PT);Rolling walker with 5" wheels (bariatric equipment)    Recommendations for Other Services       Precautions / Restrictions Precautions Precautions: Fall Precaution Comments: on cpap    Mobility  Bed Mobility Overal bed mobility: Needs Assistance Bed Mobility: Supine to Sit;Sit to Supine     Supine to sit: Mod assist;+2 for safety/equipment Sit to supine: +2 for safety/equipment;Max assist   General bed mobility comments: assist to guide legs to EOB and assist to lift trunk, pt pulling up on rails; to supine assist for both legs and to lower trunk  Transfers Overall transfer level: Needs assistance Equipment used: Rolling walker (2 wheeled) Transfers: Sit to/from Stand Sit to Stand: From elevated surface;Max assist;+2 physical assistance         General transfer comment: +2 A needed, another for safety to hold RW  Ambulation/Gait             General Gait Details: unable due to LE pain   Stairs             Wheelchair Mobility     Modified Rankin (Stroke Patients Only)       Balance Overall balance assessment: Needs assistance Sitting-balance support: Feet supported Sitting balance-Leahy Scale: Fair Sitting balance - Comments: UE used for help with dynamic activities   Standing balance support: Bilateral upper extremity supported Standing balance-Leahy Scale: Zero Standing balance comment: +2 A for standing due to pain legs                            Cognition Arousal/Alertness: Awake/alert Behavior During Therapy: WFL for tasks assessed/performed Overall Cognitive Status: Within Functional Limits for tasks assessed                                        Exercises General Exercises - Lower Extremity Ankle Circles/Pumps: AROM;5 reps;Supine Heel Slides: AAROM;Both;Supine;Other reps (comment) (2-3 reps to tolerance)    General Comments General comments (skin integrity, edema, etc.): Answered questions regarding SNF rehab and search process as well as rehab frequency.  Encouraged AROM ankles and knees while in bed.      Pertinent Vitals/Pain Pain Score: 10-Worst pain ever (however, pt reported pain better since medication for gout started) Pain Location: Bil knees - with any movement Pain Descriptors / Indicators: Throbbing;Tightness;Tender Pain Intervention(s): Limited activity within patient's tolerance;Monitored during session;Repositioned    Home Living  Prior Function            PT Goals (current goals can now be found in the care plan section) Progress towards PT goals: Progressing toward goals    Frequency    Min 2X/week      PT Plan Current plan remains appropriate    Co-evaluation              AM-PAC PT "6 Clicks" Mobility   Outcome Measure  Help needed turning from your back to your side while in a flat bed without using bedrails?: Total Help needed moving from lying on your back to sitting on the side of a  flat bed without using bedrails?: Total Help needed moving to and from a bed to a chair (including a wheelchair)?: Total Help needed standing up from a chair using your arms (e.g., wheelchair or bedside chair)?: Total Help needed to walk in hospital room?: Total Help needed climbing 3-5 steps with a railing? : Total 6 Click Score: 6    End of Session Equipment Utilized During Treatment: Gait belt Activity Tolerance: Patient limited by pain Patient left: in bed;with call bell/phone within reach;with bed alarm set   PT Visit Diagnosis: Other abnormalities of gait and mobility (R26.89);Muscle weakness (generalized) (M62.81);Pain Pain - Right/Left:  (both) Pain - part of body: Knee     Time: 1505-1530 PT Time Calculation (min) (ACUTE ONLY): 25 min  Charges:  $Therapeutic Exercise: 8-22 mins $Therapeutic Activity: 8-22 mins                     Gerald Day, PT Acute Rehabilitation Services HKVQQ:[phone] Office:[phone] 08/05/2020    Gerald Day 08/05/2020, 4:34 PM

## 2020-08-06 ENCOUNTER — Inpatient Hospital Stay (HOSPITAL_COMMUNITY): Payer: Medicare HMO

## 2020-08-06 ENCOUNTER — Other Ambulatory Visit: Payer: Self-pay | Admitting: Internal Medicine

## 2020-08-06 DIAGNOSIS — L0390 Cellulitis, unspecified: Secondary | ICD-10-CM | POA: Diagnosis not present

## 2020-08-06 DIAGNOSIS — A419 Sepsis, unspecified organism: Secondary | ICD-10-CM | POA: Diagnosis not present

## 2020-08-06 DIAGNOSIS — E1169 Type 2 diabetes mellitus with other specified complication: Secondary | ICD-10-CM | POA: Diagnosis not present

## 2020-08-06 DIAGNOSIS — I484 Atypical atrial flutter: Secondary | ICD-10-CM | POA: Diagnosis not present

## 2020-08-06 DIAGNOSIS — I10 Essential (primary) hypertension: Secondary | ICD-10-CM | POA: Diagnosis not present

## 2020-08-06 LAB — URINALYSIS, ROUTINE W REFLEX MICROSCOPIC
Bilirubin Urine: NEGATIVE
Glucose, UA: 50 mg/dL — AB
Ketones, ur: NEGATIVE mg/dL
Leukocytes,Ua: NEGATIVE
Nitrite: NEGATIVE
Protein, ur: 100 mg/dL — AB
Specific Gravity, Urine: 1.018 (ref 1.005–1.030)
pH: 5 (ref 5.0–8.0)

## 2020-08-06 LAB — BASIC METABOLIC PANEL
Anion gap: 8 (ref 5–15)
BUN: 41 mg/dL — ABNORMAL HIGH (ref 8–23)
CO2: 19 mmol/L — ABNORMAL LOW (ref 22–32)
Calcium: 7.6 mg/dL — ABNORMAL LOW (ref 8.9–10.3)
Chloride: 103 mmol/L (ref 98–111)
Creatinine, Ser: 1.71 mg/dL — ABNORMAL HIGH (ref 0.61–1.24)
GFR, Estimated: 44 mL/min — ABNORMAL LOW (ref >60)
Glucose, Bld: 228 mg/dL — ABNORMAL HIGH (ref 70–99)
Potassium: 4.9 mmol/L (ref 3.5–5.1)
Sodium: 130 mmol/L — ABNORMAL LOW (ref 135–145)

## 2020-08-06 LAB — GLUCOSE, CAPILLARY
Glucose-Capillary: 213 mg/dL — ABNORMAL HIGH (ref 70–99)
Glucose-Capillary: 263 mg/dL — ABNORMAL HIGH (ref 70–99)
Glucose-Capillary: 201 mg/dL — ABNORMAL HIGH (ref 70–99)

## 2020-08-06 LAB — CBC
HCT: 37 % — ABNORMAL LOW (ref 39.0–52.0)
Hemoglobin: 11.8 g/dL — ABNORMAL LOW (ref 13.0–17.0)
MCH: 29.5 pg (ref 26.0–34.0)
MCHC: 31.9 g/dL (ref 30.0–36.0)
MCV: 92.5 fL (ref 80.0–100.0)
Platelets: 338 10*3/uL (ref 150–400)
RBC: 4 MIL/uL — ABNORMAL LOW (ref 4.22–5.81)
RDW: 14.7 % (ref 11.5–15.5)
WBC: 27.6 10*3/uL — ABNORMAL HIGH (ref 4.0–10.5)
nRBC: 0 % (ref 0.0–0.2)

## 2020-08-06 NOTE — Progress Notes (Addendum)
Progress Note  Patient Name: BRAYAM BOEKE Date of Encounter: 08/06/2020  [facility] Cardiologist: Minus Breeding, MD   Subjective   Feeling well. No chest pain, sob or palpitations.  HR and BP are stable.   Inpatient Medications    Scheduled Meds:  apixaban  5 mg Oral BID   atorvastatin  10 mg Oral Daily   carvedilol  12.5 mg Oral BID WC   cloNIDine  0.2 mg Oral BID   diltiazem  180 mg Oral Daily   insulin aspart  0-15 Units Subcutaneous TID WC   insulin aspart  0-5 Units Subcutaneous QHS   insulin aspart  5 Units Subcutaneous TID WC   insulin glargine  30 Units Subcutaneous Daily   polyethylene glycol  17 g Oral Daily   senna-docusate  1 tablet Oral BID   sodium chloride flush  3 mL Intravenous Q12H   Continuous Infusions:  sodium chloride Stopped (08/06/20 0735)    ceFAZolin (ANCEF) IV Stopped (08/06/20 0735)   PRN Meds: sodium chloride, acetaminophen **OR** acetaminophen, hydrALAZINE, ondansetron **OR** ondansetron (ZOFRAN) IV, oxyCODONE   Vital Signs    Vitals:   08/05/20 1814 08/05/20 2057 08/05/20 2200 08/06/20 0422  BP: (!) 171/64 (!) 167/71  (!) 142/58  Pulse: 68 62  70  Resp: 18 18  18   Temp: 99.5 F (37.5 C) (!) 100.6 F (38.1 C) 98.5 F (36.9 C) 98.6 F (37 C)  TempSrc: Oral Oral Oral Oral  SpO2: 99% 95%  98%  Weight:   (!) 174.6 kg   Height:        Intake/Output Summary (Last 24 hours) at 08/06/2020 1018 Last data filed at 08/06/2020 0600 Gross per 24 hour  Intake 1074.02 ml  Output 2351 ml  Net -1276.98 ml   Last 3 Weights 08/05/2020 08/02/2020 08/01/2020  Weight (lbs) 385 lb 404 lb 15.8 oz 404 lb 15.9 oz  Weight (kg) 174.635 kg 183.7 kg 183.704 kg      Telemetry    Sinus rhythm at rate of 70-80s - Personally Reviewed  ECG    N/A  Physical Exam   GEN: Obese male in no acute distress.   Neck: No JVD Cardiac: RRR, no murmurs, rubs, or gallops.  Respiratory: Clear to auscultation bilaterally. GI: Soft,  nontender, non-distended  MS: 1+ BL LE edema; No deformity. Neuro:  Nonfocal  Psych: Normal affect   Labs    High Sensitivity Troponin:   Recent Labs  Lab 07/26/20 1329  TROPONINIHS 28*      Chemistry Recent Labs  Lab 08/04/20 0237 08/05/20 0245 08/06/20 0410  NA 132* 133* 130*  K 4.7 4.7 4.9  CL 101 102 103  CO2 19* 18* 19*  GLUCOSE 354* 302* 228*  BUN 52* 46* 41*  CREATININE 1.98* 1.84* 1.71*  CALCIUM 7.9* 7.9* 7.6*  GFRNONAA 37* 40* 44*  ANIONGAP 12 13 8      Hematology Recent Labs  Lab 08/04/20 0237 08/05/20 0245 08/06/20 0410  WBC 17.4* 18.3* 27.6*  RBC 3.10* 3.32* 4.00*  HGB 9.1* 9.6* 11.8*  HCT 28.4* 30.5* 37.0*  MCV 91.6 91.9 92.5  MCH 29.4 28.9 29.5  MCHC 32.0 31.5 31.9  RDW 14.9 14.8 14.7  PLT 352 392 338     Radiology    No results found.  Cardiac Studies   TEE/DCCV 08/02/2020  Cardioverted1time(s).  Cardioversion with synchronized biphasic200Jshock.  Evaluation: Findings: Post procedure EKG shows:NSRwith PVCs Complications:None Patientdidtolerate procedure well.   1. Left ventricular ejection fraction, by  estimation, is 50 to 55%. The  left ventricle has low normal function. The left ventricle has no regional  wall motion abnormalities.  2. Right ventricular systolic function is normal. The right ventricular  size is normal. There is normal pulmonary artery systolic pressure.  3. No left atrial/left atrial appendage thrombus was detected. The LAA  emptying velocity was 60 cm/s.  4. The mitral valve is normal in structure. Trivial mitral valve  regurgitation. No evidence of mitral stenosis.  5. The aortic valve is normal in structure. Aortic valve regurgitation is  not visualized. No aortic stenosis is present.  6. The inferior vena cava is normal in size with greater than 50%  respiratory variability, suggesting right atrial pressure of 3 mmHg.  7. Evidence of atrial level shunting detected by color flow  Doppler.  There is a very small patent foramen ovale with a tiny amount of  bidirectional shunting across atrial septum.   Echo 07/29/2020 1. Left ventricular ejection fraction, by estimation, is 55 to 60%. The  left ventricle has normal function. The left ventricle has no regional  wall motion abnormalities. There is mild left ventricular hypertrophy.  Indeterminate diastolic filling due to  E-A fusion.  2. Right ventricular systolic function was not well visualized. The right  ventricular size is not well visualized.  3. Left atrial size was mildly dilated.  4. Right atrial size was mildly dilated.  5. The mitral valve is grossly normal. Trivial mitral valve  regurgitation.  6. The aortic valve is tricuspid. Aortic valve regurgitation is not  visualized.  7. Aortic dilatation noted. There is mild dilatation of the aortic root,  measuring 41 mm. There is mild dilatation of the ascending aorta,  measuring 41 mm.   Patient Profile     64 y.o. male with a hx of DM, HTN, HLD, OA, OSA on CPAP, morbid obesity, CHF listed but has not had echo before and never admitted for same,who is being seen for the evaluation of Afibat the request of Dr Kurtis Bushman.   Assessment & Plan    1. Atrial flutter with RVR -Initially treated with oral beta-blocker and Cardizem.  Discontinued Norvasc.  Eventually underwent successful TEE cardioversion on November 26.  Maintaining sinus rhythm at controlled rate.  On Eliquis for anticoagulation.  2.  Hypertension -Norvasc discontinued initially to add Cardizem for better rate control -Toprol changed to Coreg  Yesterday due to elevated BP>> imrroved  3.  Left lower extremity cellulitis/sepsis -Per primary team   [facility] will sign off.   Medication Recommendations:  Eliquis 5mg  BID, Lipitor 10mg  qd, Coreg 12.5mg  BID, Clonidine 12.5mg  BID, and Cardizem CD 180mg  qd Other recommendations (labs, testing, etc):  Keep log of BP and bring for review  during office visit Follow up as an outpatient:  Erlene Quan, PA-C12/22 @9 :45am  For questions or updates, please contact [place] Please consult [email or web address] for contact info under     SignedLeanor Kail, PA  08/06/2020, 10:18 AM    Patient seen and examined with [facility] PA.  Agree as above, with the following exceptions and changes as noted below.  Patient resting comfortably on exam today. Gen: NAD, CV: RRR, no murmurs, Lungs: clear, Abd: soft, Extrem: Warm, 1+ edema.  All available labs, radiology testing, previous records reviewed.  Blood pressure improved on Coreg 12.5 mg twice daily, rate control remains adequate.  Patient has follow-up in approximately 3 weeks with cardiology, current medication regimen seems reasonable to continue until then.  Primary service concerned about possible need for diuresis, could consider as needed diuretics until follow-up.  Elouise Munroe, MD 08/06/20 11:14 AM

## 2020-08-06 NOTE — Progress Notes (Signed)
Patient wearing CPAP when RT entered room. Patient tolerating CPAP well at this time. No respiratory distress noted. RT will monitor as needed.

## 2020-08-06 NOTE — Progress Notes (Addendum)
PROGRESS NOTE    Gerald Day  YQI:[id number] DOB: 05-28-1956 DOA: 07/26/2020 PCP: Biagio Borg, MD   Brief Narrative:  64/M with morbid obesity, OSA, type 2 diabetes mellitus, chronic diastolic CHF, chronic lymphedema was admitted with sepsis, left lower extremity cellulitis complicated by acute kidney injury. -ID consulted by Dr. Doristine Bosworth last week, recommended to continue IV Ancef and transition to Keflex at discharge -In addition noted to have atrial flutter this hospitalization, cardiology consulted underwent cardioversion, now remains in sinus rhythm -Overnight with low-grade fevers and white count up to 27k this am  Assessment & Plan:   Sepsis secondary to left lower extremity cellulitis -Had been improving clinically on IV Ancef, erythema, edema and tenderness have improved considerably however posterior aspect of his lower leg is slightly more swollen and erythematous  -Low-grade fevers overnight, significant worsening leukocytosis this morning  -Denies any other localizing symptoms , will check urinalysis and obtain noncontrast CT of his left lower extremity  -Obtain blood cultures if spikes -CT 11/22 was negative for abscess -Continue leg elevation as tolerated -PT OT eval completed, SNF recommended for rehabilitation -Labs in a.m.  Acute gout flare -Had considerable tenderness of both ankles, right MTP joint, uric acid greater than 10 -Known history of gout, acute flare suspected, clinically improved, treated with 3 days of prednisone and colchicine, then discontinued -Last prednisone dose was 3 days ago so I do not think that is contributing to his worsening leukocytosis today  Atrial flutter with RVR -cards following,  -Remains on Cardizem, metoprolol, DC cardioverted back into sinus rhythm 11/25 -Continue Eliquis, remains in sinus rhythm  Chronic diastolic CHF -Diuretics were held due to AKI -Kidney function slowly improving, will need to resume diuretics in 1  to 2 days  AKI on CKD 3 A -Creatinine was 2.6 on admission, baseline around 1.5 -Likely ATN, now down to 1.7,  monitor closely, avoid nephrotoxins  Type 2 diabetes mellitus -Hemoglobin A1c is 7.0,  -CBGs in the 200s, increase Lantus, continue NovoLog with meals  Morbid obesity OSA -Continue CPAP, needs to lose weight  Hypertension -Continue Norvasc and clonidine   DVT prophylaxis: eliquis Code Status: Full code Family Communication: None at bedside  Status is: Inpatient  Remains inpatient appropriate because:IV treatments appropriate due to intensity of illness    Dispo: The patient is from: Home              Anticipated d/c is to: SNF              Anticipated d/c date is: Possibly 48 hours if leukocytosis improves and no new infection process              Patient currently is not medically stable to d/c   Consultants:   ID  Procedures:  CT foot/tibia/fibula 11/22 1. Severe and diffuse skin thickening, subcutaneous soft tissue swelling/edema/fluid and what appear to be wounds along the lateral aspect of the lower extremity. No discrete fluid collection but exam limited without contrast to assess for abscess. 2. No findings suspicious for myofasciitis or pyomyositis. 3. No CT findings suspicious for septic arthritis or osteomyelitis. 4. Moderate-sized knee joint effusion. 5. Age advanced vascular calcifications.   Antimicrobials:   Vancomycin, cefepime--->>dc 11/23  Metronidazole d/c'd Cefazolin start 11/23...>  Subjective: -Feels okay overall, no new complaints, low-grade fevers yesterday, white count up to 27K  Objective: Vitals:   08/05/20 2057 08/05/20 2200 08/06/20 0422 08/06/20 1032  BP: (!) 167/71  (!) 142/58 (!) 141/64  Pulse:  62  70 62  Resp: 18  18 20   Temp: (!) 100.6 F (38.1 C) 98.5 F (36.9 C) 98.6 F (37 C) 97.9 F (36.6 C)  TempSrc: Oral Oral Oral Oral  SpO2: 95%  98% 95%  Weight:  (!) 174.6 kg    Height:        Intake/Output  Summary (Last 24 hours) at 08/06/2020 1148 Last data filed at 08/06/2020 0600 Gross per 24 hour  Intake 1074.02 ml  Output 2351 ml  Net -1276.98 ml   Filed Weights   08/01/20 2117 08/02/20 0737 08/05/20 2200  Weight: (!) 183.7 kg (!) 183.7 kg (!) 174.6 kg    Examination: General exam: Morbidly obese male laying in bed, AAOx3, no distress HEENT: Neck obese rate rhythm CVS: S1-S2, regular rhythm Lungs: Distant breath sounds, otherwise clear Abdomen: Soft, nontender, bowel sounds present Extremities :Improving erythema over his left lower leg, slightly increased erythema posteriorly, improved tenderness in both ankles and right MTP joint  Psychiatry:  Mood & affect appropriate.   Data Reviewed: I have personally reviewed following labs and imaging studies  CBC: Recent Labs  Lab 08/01/20 0145 08/01/20 0145 08/02/20 0141 08/03/20 0726 08/04/20 0237 08/05/20 0245 08/06/20 0410  WBC 17.5*   < > 18.7* 18.8* 17.4* 18.3* 27.6*  NEUTROABS 14.2*  --   --   --   --   --   --   HGB 9.2*   < > 9.4* 9.6* 9.1* 9.6* 11.8*  HCT 28.7*   < > 28.8* 30.0* 28.4* 30.5* 37.0*  MCV 93.2   < > 92.6 91.5 91.6 91.9 92.5  PLT 343   < > 368 424* 352 392 338   < > = values in this interval not displayed.   Basic Metabolic Panel: Recent Labs  Lab 08/02/20 0141 08/03/20 0726 08/04/20 0237 08/05/20 0245 08/06/20 0410  NA 132* 133* 132* 133* 130*  K 4.0 4.0 4.7 4.7 4.9  CL 101 100 101 102 103  CO2 20* 19* 19* 18* 19*  GLUCOSE 301* 247* 354* 302* 228*  BUN 40* 48* 52* 46* 41*  CREATININE 1.96* 2.08* 1.98* 1.84* 1.71*  CALCIUM 7.8* 8.1* 7.9* 7.9* 7.6*   GFR: Estimated Creatinine Clearance: 70.9 mL/min (A) (by C-G formula based on SCr of 1.71 mg/dL (H)). Liver Function Tests: No results for input(s): AST, ALT, ALKPHOS, BILITOT, PROT, ALBUMIN in the last 168 hours. No results for input(s): LIPASE, AMYLASE in the last 168 hours. No results for input(s): AMMONIA in the last 168  hours. Coagulation Profile: No results for input(s): INR, PROTIME in the last 168 hours. Cardiac Enzymes: No results for input(s): CKTOTAL, CKMB, CKMBINDEX, TROPONINI in the last 168 hours. BNP (last 3 results) No results for input(s): PROBNP in the last 8760 hours. HbA1C: No results for input(s): HGBA1C in the last 72 hours. CBG: Recent Labs  Lab 08/05/20 1201 08/05/20 1711 08/05/20 2059 08/06/20 0637 08/06/20 1146  GLUCAP 351* 227* 210* 213* 263*   Lipid Profile: No results for input(s): CHOL, HDL, LDLCALC, TRIG, CHOLHDL, LDLDIRECT in the last 72 hours. Thyroid Function Tests: No results for input(s): TSH, T4TOTAL, FREET4, T3FREE, THYROIDAB in the last 72 hours. Anemia Panel: No results for input(s): VITAMINB12, FOLATE, FERRITIN, TIBC, IRON, RETICCTPCT in the last 72 hours. Sepsis Labs: No results for input(s): PROCALCITON, LATICACIDVEN in the last 168 hours.  Recent Results (from the past 240 hour(s))  Urine Culture     Status: None   Collection Time: 07/27/20  1:45  PM   Specimen: Urine, Random  Result Value Ref Range Status   Specimen Description URINE, RANDOM  Final   Special Requests NONE  Final   Culture   Final    NO GROWTH Performed at [facility], [address], [place], [place] [address]    Report Status 07/28/2020 FINAL  Final  Culture, blood (routine x 2)     Status: None   Collection Time: 07/29/20  5:52 PM   Specimen: BLOOD  Result Value Ref Range Status   Specimen Description BLOOD LEFT ANTECUBITAL  Final   Special Requests   Final    BOTTLES DRAWN AEROBIC AND ANAEROBIC Blood Culture results may not be optimal due to an excessive volume of blood received in culture bottles   Culture   Final    NO GROWTH 5 DAYS Performed at [facility], [place] [address], [place], [place] [address]    Report Status 08/03/2020 FINAL  Final  Culture, blood (routine x 2)     Status: None   Collection Time: 07/29/20  5:57 PM   Specimen: BLOOD  Result Value  Ref Range Status   Specimen Description BLOOD RIGHT ANTECUBITAL  Final   Special Requests   Final    BOTTLES DRAWN AEROBIC AND ANAEROBIC Blood Culture results may not be optimal due to an excessive volume of blood received in culture bottles   Culture   Final    NO GROWTH 5 DAYS Performed at [facility], [place] [address], [place], [place] [address]    Report Status 08/03/2020 FINAL  Final  Urine Culture     Status: None   Collection Time: 07/30/20 12:22 PM   Specimen: Urine, Random  Result Value Ref Range Status   Specimen Description URINE, RANDOM  Final   Special Requests NONE  Final   Culture   Final    NO GROWTH Performed at [facility], [place] [address] [place], [place] [address]    Report Status 07/31/2020 FINAL  Final      Scheduled Meds: . apixaban  5 mg Oral BID  . atorvastatin  10 mg Oral Daily  . carvedilol  12.5 mg Oral BID WC  . cloNIDine  0.2 mg Oral BID  . diltiazem  180 mg Oral Daily  . insulin aspart  0-15 Units Subcutaneous TID WC  . insulin aspart  0-5 Units Subcutaneous QHS  . insulin aspart  5 Units Subcutaneous TID WC  . insulin glargine  30 Units Subcutaneous Daily  . polyethylene glycol  17 g Oral Daily  . senna-docusate  1 tablet Oral BID  . sodium chloride flush  3 mL Intravenous Q12H   Continuous Infusions: . sodium chloride Stopped (08/06/20 0735)  .  ceFAZolin (ANCEF) IV Stopped (08/06/20 0735)     LOS: 11 days   Time spent: 24min  Domenic Polite, MD [facility]11/30/2021, 11:48 AM

## 2020-08-06 NOTE — Telephone Encounter (Signed)
Please refill as per office routine med refill policy (all routine meds refilled for 3 mo or monthly per pt preference up to one year from last visit, then month to month grace period for 3 mo, then further med refills will have to be denied)  

## 2020-08-06 NOTE — Progress Notes (Signed)
Patient resting comfortably on CPAP. Home mask.

## 2020-08-06 NOTE — Plan of Care (Signed)
  Problem: Pain Managment: Goal: General experience of comfort will improve Outcome: Progressing   

## 2020-08-06 NOTE — Progress Notes (Signed)
RT note. Pt. Placed on CPAP. Home nasal mask. RT will continue to monitor. Pt. Resting comfortable at this time.

## 2020-08-07 LAB — GLUCOSE, CAPILLARY
Glucose-Capillary: 189 mg/dL — ABNORMAL HIGH (ref 70–99)
Glucose-Capillary: 190 mg/dL — ABNORMAL HIGH (ref 70–99)
Glucose-Capillary: 186 mg/dL — ABNORMAL HIGH (ref 70–99)
Glucose-Capillary: 195 mg/dL — ABNORMAL HIGH (ref 70–99)

## 2020-08-07 LAB — URINE CULTURE

## 2020-08-07 LAB — C-REACTIVE PROTEIN: CRP: 11.5 mg/dL — ABNORMAL HIGH (ref <1.0)

## 2020-08-07 LAB — BASIC METABOLIC PANEL
Anion gap: 10 (ref 5–15)
BUN: 47 mg/dL — ABNORMAL HIGH (ref 8–23)
CO2: 19 mmol/L — ABNORMAL LOW (ref 22–32)
Calcium: 7.8 mg/dL — ABNORMAL LOW (ref 8.9–10.3)
Chloride: 105 mmol/L (ref 98–111)
Creatinine, Ser: 1.8 mg/dL — ABNORMAL HIGH (ref 0.61–1.24)
GFR, Estimated: 41 mL/min — ABNORMAL LOW (ref >60)
Glucose, Bld: 187 mg/dL — ABNORMAL HIGH (ref 70–99)
Potassium: 4.5 mmol/L (ref 3.5–5.1)
Sodium: 134 mmol/L — ABNORMAL LOW (ref 135–145)

## 2020-08-07 LAB — MAGNESIUM: Magnesium: 2.2 mg/dL (ref 1.7–2.4)

## 2020-08-07 LAB — CBC
HCT: 28.6 % — ABNORMAL LOW (ref 39.0–52.0)
Hemoglobin: 9.2 g/dL — ABNORMAL LOW (ref 13.0–17.0)
MCH: 29.7 pg (ref 26.0–34.0)
MCHC: 32.2 g/dL (ref 30.0–36.0)
MCV: 92.3 fL (ref 80.0–100.0)
Platelets: 286 10*3/uL (ref 150–400)
RBC: 3.1 MIL/uL — ABNORMAL LOW (ref 4.22–5.81)
RDW: 14.7 % (ref 11.5–15.5)
WBC: 13.5 10*3/uL — ABNORMAL HIGH (ref 4.0–10.5)
nRBC: 0 % (ref 0.0–0.2)

## 2020-08-07 LAB — SEDIMENTATION RATE: Sed Rate: 77 mm/hr — ABNORMAL HIGH (ref 0–16)

## 2020-08-07 MED ORDER — INSULIN GLARGINE 100 UNIT/ML ~~LOC~~ SOLN
35.0000 [IU] | Freq: Every day | SUBCUTANEOUS | Status: DC
  Administered 2020-08-08 – 2020-08-09 (×2): 35 [IU] via SUBCUTANEOUS
  Filled 2020-08-07 (×3): qty 0.35

## 2020-08-07 NOTE — Progress Notes (Addendum)
PROGRESS NOTE    Gerald Day  RDE:[id number] DOB: 04/27/1956 DOA: 07/26/2020 PCP: Biagio Borg, MD   Brief Narrative:  64/M with morbid obesity, OSA, type 2 diabetes mellitus, chronic diastolic CHF, chronic lymphedema was admitted with sepsis, left lower extremity cellulitis complicated by acute kidney injury. -ID consulted by Dr. Doristine Bosworth last week, recommended to continue IV Ancef and transition to Keflex at discharge -In addition noted to have atrial flutter this hospitalization, cardiology consulted underwent cardioversion, now remains in sinus rhythm -Overnight with low-grade fevers and white count up to 27k this am  Assessment & Plan:   Sepsis secondary to left lower extremity cellulitis -Had been improving clinically on IV Ancef, erythema, edema and tenderness has improved considerably however he had low-grade fevers overnight on 08/05/2020 with significant worsening leukocytosis on 08/06/2020.  Repeat CT of the left lower extremity was done which ruled out abscess again.  CT was already done on 07/29/2020 which was negative for abscess as well.  Continue current antibiotics.  Leukocytosis improved significantly.  Try to keep leg elevated.  Acute gout flare -Had considerable tenderness of both ankles, right MTP joint, uric acid greater than 10 -Known history of gout, acute flare suspected, clinically improved, treated with 3 days of prednisone and colchicine, then discontinued -Last prednisone dose was 4 days ago so I do not think that is contributing to his worsening leukocytosis today  Atrial flutter with RVR -cards was following, signed off on 08/06/2020. -Remains on Cardizem, metoprolol, DC cardioverted back into sinus rhythm 11/25 Medication Recommendations per cardiology:  Eliquis 5mg  BID, Lipitor 10mg  qd, Coreg 12.5mg  BID, Clonidine 12.5mg  BID, and Cardizem CD 180mg  qd  Chronic diastolic CHF -Diuretics were held due to AKI -Kidney function slowly improving, will need  to resume diuretics in 1 to 2 days  AKI  -Creatinine was 2.6 on admission, baseline around 1.1 -Likely ATN, now down to 1.8 and has plateaued since last 3 days.  This may be his new baseline.  Type 2 diabetes mellitus -Hemoglobin A1c is 7.0,  -CBGs in the 200s, increase Lantus further to 35 units starting tomorrow.  Continue SSI.  Morbid obesity OSA -Continue CPAP, needs to lose weight  Hypertension -Continue Norvasc and clonidine   DVT prophylaxis: eliquis Code Status: Full code Family Communication: None at bedside  Status is: Inpatient  Remains inpatient appropriate because:IV treatments appropriate due to intensity of illness    Dispo: The patient is from: Home              Anticipated d/c is to: SNF              Anticipated d/c date is: Anytime.  Waiting for TOC to arrange bed               Patient currently is medically stable to d/c   Consultants:   ID  Procedures:  CT foot/tibia/fibula 11/22 1. Severe and diffuse skin thickening, subcutaneous soft tissue swelling/edema/fluid and what appear to be wounds along the lateral aspect of the lower extremity. No discrete fluid collection but exam limited without contrast to assess for abscess. 2. No findings suspicious for myofasciitis or pyomyositis. 3. No CT findings suspicious for septic arthritis or osteomyelitis. 4. Moderate-sized knee joint effusion. 5. Age advanced vascular calcifications.   Antimicrobials:   Vancomycin, cefepime--->>dc 11/23  Metronidazole d/c'd Cefazolin start 11/23...>  Subjective: Patient seen and examined.  No new complaint other than mild pain in the left lower extremity.  Has not had any  fever in last 24 hours.  Objective: Vitals:   08/06/20 1735 08/06/20 2027 08/06/20 2051 08/07/20 0556  BP: 105/67 (!) 123/51  (!) 141/55  Pulse: 61 60 68 65  Resp: 18 19 16 18   Temp: 97.9 F (36.6 C) 98.5 F (36.9 C)  98.5 F (36.9 C)  TempSrc:  Oral  Oral  SpO2: 98% 95% 95% 97%   Weight:      Height:        Intake/Output Summary (Last 24 hours) at 08/07/2020 1315 Last data filed at 08/07/2020 1212 Gross per 24 hour  Intake 1391.88 ml  Output 1050 ml  Net 341.88 ml   Filed Weights   08/01/20 2117 08/02/20 0737 08/05/20 2200  Weight: (!) 183.7 kg (!) 183.7 kg (!) 174.6 kg    Examination:  General exam: Appears calm and comfortable, morbidly obese Respiratory system: Clear to auscultation. Respiratory effort normal. Cardiovascular system: S1 & S2 heard, RRR. No JVD, murmurs, rubs, gallops or clicks. No pedal edema. Gastrointestinal system: Abdomen is nondistended, soft and nontender. No organomegaly or masses felt. Normal bowel sounds heard. Central nervous system: Alert and oriented. No focal neurological deficits. Extremities: Symmetric 5 x 5 power. Skin: Has dressing in the left lower extremity but there was not much erythema on that. Psychiatry: Judgement and insight appear normal. Mood & affect appropriate.   Data Reviewed: I have personally reviewed following labs and imaging studies  CBC: Recent Labs  Lab 08/01/20 0145 08/02/20 0141 08/03/20 0726 08/04/20 0237 08/05/20 0245 08/06/20 0410 08/07/20 0219  WBC 17.5*   < > 18.8* 17.4* 18.3* 27.6* 13.5*  NEUTROABS 14.2*  --   --   --   --   --   --   HGB 9.2*   < > 9.6* 9.1* 9.6* 11.8* 9.2*  HCT 28.7*   < > 30.0* 28.4* 30.5* 37.0* 28.6*  MCV 93.2   < > 91.5 91.6 91.9 92.5 92.3  PLT 343   < > 424* 352 392 338 286   < > = values in this interval not displayed.   Basic Metabolic Panel: Recent Labs  Lab 08/03/20 0726 08/04/20 0237 08/05/20 0245 08/06/20 0410 08/07/20 0219 08/07/20 0821  NA 133* 132* 133* 130* 134*  --   K 4.0 4.7 4.7 4.9 4.5  --   CL 100 101 102 103 105  --   CO2 19* 19* 18* 19* 19*  --   GLUCOSE 247* 354* 302* 228* 187*  --   BUN 48* 52* 46* 41* 47*  --   CREATININE 2.08* 1.98* 1.84* 1.71* 1.80*  --   CALCIUM 8.1* 7.9* 7.9* 7.6* 7.8*  --   MG  --   --   --   --   --   2.2   GFR: Estimated Creatinine Clearance: 67.4 mL/min (A) (by C-G formula based on SCr of 1.8 mg/dL (H)). Liver Function Tests: No results for input(s): AST, ALT, ALKPHOS, BILITOT, PROT, ALBUMIN in the last 168 hours. No results for input(s): LIPASE, AMYLASE in the last 168 hours. No results for input(s): AMMONIA in the last 168 hours. Coagulation Profile: No results for input(s): INR, PROTIME in the last 168 hours. Cardiac Enzymes: No results for input(s): CKTOTAL, CKMB, CKMBINDEX, TROPONINI in the last 168 hours. BNP (last 3 results) No results for input(s): PROBNP in the last 8760 hours. HbA1C: No results for input(s): HGBA1C in the last 72 hours. CBG: Recent Labs  Lab 08/06/20 [phone] 08/06/20 1146 08/06/20 1654 08/07/20 2671  08/07/20 1150  GLUCAP 213* 263* 201* 189* 190*   Lipid Profile: No results for input(s): CHOL, HDL, LDLCALC, TRIG, CHOLHDL, LDLDIRECT in the last 72 hours. Thyroid Function Tests: No results for input(s): TSH, T4TOTAL, FREET4, T3FREE, THYROIDAB in the last 72 hours. Anemia Panel: No results for input(s): VITAMINB12, FOLATE, FERRITIN, TIBC, IRON, RETICCTPCT in the last 72 hours. Sepsis Labs: No results for input(s): PROCALCITON, LATICACIDVEN in the last 168 hours.  Recent Results (from the past 240 hour(s))  Culture, blood (routine x 2)     Status: None   Collection Time: 07/29/20  5:52 PM   Specimen: BLOOD  Result Value Ref Range Status   Specimen Description BLOOD LEFT ANTECUBITAL  Final   Special Requests   Final    BOTTLES DRAWN AEROBIC AND ANAEROBIC Blood Culture results may not be optimal due to an excessive volume of blood received in culture bottles   Culture   Final    NO GROWTH 5 DAYS Performed at [facility], [place] [address], [place], [place] [address]    Report Status 08/03/2020 FINAL  Final  Culture, blood (routine x 2)     Status: None   Collection Time: 07/29/20  5:57 PM   Specimen: BLOOD  Result Value Ref Range Status    Specimen Description BLOOD RIGHT ANTECUBITAL  Final   Special Requests   Final    BOTTLES DRAWN AEROBIC AND ANAEROBIC Blood Culture results may not be optimal due to an excessive volume of blood received in culture bottles   Culture   Final    NO GROWTH 5 DAYS Performed at [facility], [place] [address], [place], [place] [address]    Report Status 08/03/2020 FINAL  Final  Urine Culture     Status: None   Collection Time: 07/30/20 12:22 PM   Specimen: Urine, Random  Result Value Ref Range Status   Specimen Description URINE, RANDOM  Final   Special Requests NONE  Final   Culture   Final    NO GROWTH Performed at [facility], [place] [address] [place], [place] [address]    Report Status 07/31/2020 FINAL  Final  Culture, Urine     Status: Abnormal   Collection Time: 08/06/20  4:31 PM   Specimen: Urine, Random  Result Value Ref Range Status   Specimen Description URINE, RANDOM  Final   Special Requests   Final    NONE Performed at [facility], [place] [address] [place], [place] [address]    Culture MULTIPLE SPECIES PRESENT, SUGGEST RECOLLECTION (A)  Final   Report Status 08/07/2020 FINAL  Final      Scheduled Meds: . apixaban  5 mg Oral BID  . atorvastatin  10 mg Oral Daily  . carvedilol  12.5 mg Oral BID WC  . cloNIDine  0.2 mg Oral BID  . diltiazem  180 mg Oral Daily  . insulin aspart  0-15 Units Subcutaneous TID WC  . insulin aspart  0-5 Units Subcutaneous QHS  . insulin aspart  5 Units Subcutaneous TID WC  . insulin glargine  30 Units Subcutaneous Daily  . polyethylene glycol  17 g Oral Daily  . senna-docusate  1 tablet Oral BID  . sodium chloride flush  3 mL Intravenous Q12H   Continuous Infusions: . sodium chloride Stopped (08/06/20 0656)  .  ceFAZolin (ANCEF) IV 2 g (08/07/20 0508)     LOS: 12 days   Time spent: 30  min  Darliss Cheney, MD [facility]12/09/2019, 1:15 PM

## 2020-08-07 NOTE — Plan of Care (Signed)
  Problem: Clinical Measurements: Goal: Ability to maintain clinical measurements within normal limits will improve Outcome: Progressing   Problem: Clinical Measurements: Goal: Will remain free from infection Outcome: Progressing   

## 2020-08-07 NOTE — Plan of Care (Signed)
?  Problem: Clinical Measurements: ?Goal: Ability to avoid or minimize complications of infection will improve ?Outcome: Progressing ?  ?Problem: Skin Integrity: ?Goal: Skin integrity will improve ?Outcome: Progressing ?  ?

## 2020-08-07 NOTE — TOC Progression Note (Addendum)
Transition of Care [facility]) - Progression Note    Patient Details  Name: Gerald Day MRN: [id number] Date of Birth: 11-10-1955  Transition of Care [facility]) CM/SW Contact  Sharlet Salina Mila Homer, LCSW Phone Number: 08/07/2020, 11:13 AM  Clinical Narrative:  To date, patient has no bed offers. CSW expanded search outside of [facility].    Checked facility responses this afternoon and received one bed offer from [facility]. Visited with patient and he is agreeable to going to this facility. Call made to [facility] (4:36 pm) and message left for admissions director Irven Shelling.   5:14 pm: No return call received from [facility]. Will f/u with facility on 12/2.   Expected Discharge Plan: [place] Barriers to Discharge: Continued Medical Work up  Expected Discharge Plan and Services Expected Discharge Plan: [place] Choice: [place] arrangements for the past 2 months: Single Family Home                                     Social Determinants of Health (SDOH) Interventions    Readmission Risk Interventions No flowsheet data found.

## 2020-08-07 NOTE — Progress Notes (Signed)
Inpatient Diabetes Program Recommendations  AACE/ADA: New Consensus Statement on Inpatient Glycemic Control (2015)  Target Ranges:  Prepandial:   less than 140 mg/dL      Peak postprandial:   less than 180 mg/dL (1-2 hours)      Critically ill patients:  140 - 180 mg/dL   Lab Results  Component Value Date   GLUCAP 189 (H) 08/07/2020   HGBA1C 7.0 (H) 06/27/2020    Review of Glycemic Control Results for Gerald Day, Gerald Day (MRN [id number]) as of 08/07/2020 11:42  Ref. Range 08/06/2020 16:54 08/07/2020 06:39  Glucose-Capillary Latest Ref Range: 70 - 99 mg/dL 201 (H) 189 (H)    While awaiting bed placement, if to remain inpatient, consider increasing Novolog to 8 units TID (assuming patient is consuming >50% of meal).   Thanks, Bronson Curb, MSN, RNC-OB Diabetes Coordinator [phone] (8a-5p)

## 2020-08-07 NOTE — Progress Notes (Signed)
Physical Therapy Treatment Patient Details Name: Gerald Day MRN: [id number] DOB: 27-Jun-1956 Today's Date: 08/07/2020    History of Present Illness Pt is 64 yo male with PMH including OSA on CPAP, morbid obesity, HTN, HLD, DM, CHF, cellulitis, and pt reports hx of gout.  He presented with L LE cellulitis, LE edema, and shortness of breath.  Pt admitted with sepsis due to cellulitis.  Pt additionally with aflutter with RVR, cardiology consult, and possible TEE on Friday.    PT Comments    Patient unable to progress to standing this session with increased pain in L LE.  Also seems more depressed than last session needing increased cues for technique with supine to sit and less motivated to push through the pain.  Feel he would benefit from continue skilled PT in the acute setting and follow up SNF level rehab at d/c.    Follow Up Recommendations  SNF;Supervision/Assistance - 24 hour     Equipment Recommendations  3in1 (PT);Rolling walker with 5" wheels (bariatric)    Recommendations for Other Services       Precautions / Restrictions Precautions Precautions: Fall Precaution Comments: on cpap    Mobility  Bed Mobility Overal bed mobility: Needs Assistance Bed Mobility: Supine to Sit;Sit to Supine     Supine to sit: Mod assist;HOB elevated;+2 for physical assistance Sit to supine: Max assist;+2 for physical assistance   General bed mobility comments: slower to initiate, assist for moving legs closer to EOB, then assist for trunk lifting; to supine assist for lifting legs and scooting up in bed with bed in trendelenberg  Transfers Overall transfer level: Needs assistance Equipment used: Rolling walker (2 wheeled) Transfers: Sit to/from Stand Sit to Stand: +2 safety/equipment;Max assist         General transfer comment: attempted x 3 to stand, first pt sliding closer to EOB, then attempted twice, but pt c/o too much pain, assisted to scoot back for  safety  Ambulation/Gait                 Stairs             Wheelchair Mobility    Modified Rankin (Stroke Patients Only)       Balance Overall balance assessment: Needs assistance Sitting-balance support: Feet supported Sitting balance-Leahy Scale: Fair Sitting balance - Comments: UE assist needed for balance due to near EOB                                    Cognition Arousal/Alertness: Awake/alert Behavior During Therapy: Flat affect Overall Cognitive Status: Within Functional Limits for tasks assessed                                 General Comments: seems more depressed mood      Exercises General Exercises - Lower Extremity Ankle Circles/Pumps: AROM;Both;Supine;5 reps Heel Slides: AAROM;Both;Supine;Other reps (comment);5 reps Hip ABduction/ADduction: AAROM;Right;Other reps (comment) (x 1)    General Comments        Pertinent Vitals/Pain Pain Assessment: Faces Faces Pain Scale: Hurts whole lot Pain Location: L posterior calf worse when attempting weight bearing Pain Descriptors / Indicators: Throbbing;Tightness;Tender Pain Intervention(s): Monitored during session;Limited activity within patient's tolerance    Home Living  Prior Function            PT Goals (current goals can now be found in the care plan section) Progress towards PT goals: Not progressing toward goals - comment    Frequency    Min 2X/week      PT Plan Current plan remains appropriate    Co-evaluation              AM-PAC PT "6 Clicks" Mobility   Outcome Measure  Help needed turning from your back to your side while in a flat bed without using bedrails?: Total Help needed moving from lying on your back to sitting on the side of a flat bed without using bedrails?: Total Help needed moving to and from a bed to a chair (including a wheelchair)?: Total Help needed standing up from a chair using your  arms (e.g., wheelchair or bedside chair)?: Total Help needed to walk in hospital room?: Total Help needed climbing 3-5 steps with a railing? : Total 6 Click Score: 6    End of Session Equipment Utilized During Treatment: Gait belt Activity Tolerance: Patient limited by pain Patient left: in bed;with call bell/phone within reach   PT Visit Diagnosis: Other abnormalities of gait and mobility (R26.89);Muscle weakness (generalized) (M62.81);Pain Pain - Right/Left: Left Pain - part of body: Leg     Time: 8478-4128 PT Time Calculation (min) (ACUTE ONLY): 27 min  Charges:  $Therapeutic Exercise: 8-22 mins $Therapeutic Activity: 8-22 mins                     Magda Kiel, PT Acute Rehabilitation Services SKSHN:[phone] [phone] 08/07/2020    Reginia Naas 08/07/2020, 4:28 PM

## 2020-08-08 LAB — CBC WITH DIFFERENTIAL/PLATELET
Abs Immature Granulocytes: 0.27 10*3/uL — ABNORMAL HIGH (ref 0.00–0.07)
Basophils Absolute: 0.1 10*3/uL (ref 0.0–0.1)
Basophils Relative: 1 %
Eosinophils Absolute: 0.2 10*3/uL (ref 0.0–0.5)
Eosinophils Relative: 2 %
HCT: 27.9 % — ABNORMAL LOW (ref 39.0–52.0)
Hemoglobin: 9.2 g/dL — ABNORMAL LOW (ref 13.0–17.0)
Immature Granulocytes: 3 %
Lymphocytes Relative: 8 %
Lymphs Abs: 0.8 10*3/uL (ref 0.7–4.0)
MCH: 30.5 pg (ref 26.0–34.0)
MCHC: 33 g/dL (ref 30.0–36.0)
MCV: 92.4 fL (ref 80.0–100.0)
Monocytes Absolute: 1 10*3/uL (ref 0.1–1.0)
Monocytes Relative: 9 %
Neutro Abs: 8.1 10*3/uL — ABNORMAL HIGH (ref 1.7–7.7)
Neutrophils Relative %: 77 %
Platelets: 290 10*3/uL (ref 150–400)
RBC: 3.02 MIL/uL — ABNORMAL LOW (ref 4.22–5.81)
RDW: 14.6 % (ref 11.5–15.5)
WBC: 10.3 10*3/uL (ref 4.0–10.5)
nRBC: 0 % (ref 0.0–0.2)

## 2020-08-08 LAB — BASIC METABOLIC PANEL
Anion gap: 11 (ref 5–15)
BUN: 41 mg/dL — ABNORMAL HIGH (ref 8–23)
CO2: 22 mmol/L (ref 22–32)
Calcium: 8.3 mg/dL — ABNORMAL LOW (ref 8.9–10.3)
Chloride: 102 mmol/L (ref 98–111)
Creatinine, Ser: 1.62 mg/dL — ABNORMAL HIGH (ref 0.61–1.24)
GFR, Estimated: 47 mL/min — ABNORMAL LOW (ref >60)
Glucose, Bld: 233 mg/dL — ABNORMAL HIGH (ref 70–99)
Potassium: 5.2 mmol/L — ABNORMAL HIGH (ref 3.5–5.1)
Sodium: 135 mmol/L (ref 135–145)

## 2020-08-08 LAB — GLUCOSE, CAPILLARY
Glucose-Capillary: 164 mg/dL — ABNORMAL HIGH (ref 70–99)
Glucose-Capillary: 217 mg/dL — ABNORMAL HIGH (ref 70–99)
Glucose-Capillary: 159 mg/dL — ABNORMAL HIGH (ref 70–99)
Glucose-Capillary: 134 mg/dL — ABNORMAL HIGH (ref 70–99)

## 2020-08-08 NOTE — Progress Notes (Signed)
PROGRESS NOTE    Gerald Day  UMP:[id number] DOB: [birth year] DOA: 07/26/2020 PCP: Biagio Borg, MD   Brief Narrative:  64/M with morbid obesity, OSA, type 2 diabetes mellitus, chronic diastolic CHF, chronic lymphedema was admitted with sepsis, left lower extremity cellulitis complicated by acute kidney injury. -ID consulted by Dr. Doristine Bosworth last week, recommended to continue IV Ancef and transition to Keflex at discharge -In addition noted to have atrial flutter this hospitalization, cardiology consulted underwent cardioversion, now remains in sinus rhythm -Overnight with low-grade fevers and white count up to 27k this am  Assessment & Plan:   Sepsis secondary to left lower extremity cellulitis -Had been improving clinically on IV Ancef, erythema, edema and tenderness has improved considerably however he had low-grade fevers overnight on 08/05/2020 with significant worsening leukocytosis on 08/06/2020.  Repeat CT of the left lower extremity was done which ruled out abscess again.  CT was already done on 07/29/2020 which was negative for abscess as well. Leukocytosis resolved. He has remained afebrile since then. Continue current antibiotics for now. Keep leg elevated.  Acute gout flare -Had considerable tenderness of both ankles, right MTP joint, uric acid greater than 10 -Known history of gout, acute flare suspected, clinically improved, treated with 3 days of prednisone and colchicine, then discontinued -Last prednisone dose was 4 days ago so I do not think that is contributing to his worsening leukocytosis today  Atrial flutter with RVR -cards was following, signed off on 08/06/2020. -Remains on Cardizem, metoprolol, DC cardioverted back into sinus rhythm 11/25 Medication Recommendations per cardiology:  Eliquis 5mg  BID, Lipitor 10mg  qd, Coreg 12.5mg  BID, Clonidine 12.5mg  BID, and Cardizem CD 180mg  qd  Chronic diastolic CHF -Diuretics were held due to AKI -Kidney function slowly  improving, will need to resume diuretics in 1 to 2 days  AKI  -Creatinine was 2.6 on admission, baseline around 1.1 -Likely ATN, improving slowly and now down to 1.6.  Type 2 diabetes mellitus -Hemoglobin A1c is 7.0,  -CBGs in the 200s, increase Lantus further to 35 units and continue SSI.  Morbid obesity OSA -Continue CPAP, needs to lose weight  Hypertension -Continue Norvasc and clonidine   DVT prophylaxis: eliquis Code Status: Full code Family Communication: Son at the bedside  Status is: Inpatient  Remains inpatient appropriate because: Unsafe discharge plan   Dispo: The patient is from: Home              Anticipated d/c is to: SNF              Anticipated d/c date is: Anytime.  Waiting for TOC to arrange bed               Patient currently is medically stable to d/c   Consultants:   ID  Procedures:  CT foot/tibia/fibula 11/22 1. Severe and diffuse skin thickening, subcutaneous soft tissue swelling/edema/fluid and what appear to be wounds along the lateral aspect of the lower extremity. No discrete fluid collection but exam limited without contrast to assess for abscess. 2. No findings suspicious for myofasciitis or pyomyositis. 3. No CT findings suspicious for septic arthritis or osteomyelitis. 4. Moderate-sized knee joint effusion. 5. Age advanced vascular calcifications.   Antimicrobials:   Vancomycin, cefepime--->>dc 11/23  Metronidazole d/c'd Cefazolin start 11/23...>  Subjective: Seen and examined. Son at the bedside. Complains of some pain in the left lower extremity but no other complaint. Frustrated that he still has to be in the hospital as we are waiting for insurance authorization.  Objective: Vitals:   08/07/20 1720 08/07/20 2048 08/08/20 0515 08/08/20 0909  BP: (!) 164/58 (!) 140/57 (!) 173/68 (!) 138/56  Pulse: 75 71 72 79  Resp: 19 19 18 18   Temp: 98.7 F (37.1 C) 98.4 F (36.9 C) 98.2 F (36.8 C) 98.7 F (37.1 C)  TempSrc: Oral      SpO2: 98% 96% 97% 99%  Weight:  (!) 174.7 kg    Height:        Intake/Output Summary (Last 24 hours) at 08/08/2020 1127 Last data filed at 08/08/2020 2595 Gross per 24 hour  Intake 2040 ml  Output 1500 ml  Net 540 ml   Filed Weights   08/02/20 0737 08/05/20 2200 08/07/20 2048  Weight: (!) 183.7 kg (!) 174.6 kg (!) 174.7 kg    Examination:  General exam: Appears calm and comfortable, morbidly obese Respiratory system: Clear to auscultation. Respiratory effort normal. Cardiovascular system: S1 & S2 heard, RRR. No JVD, murmurs, rubs, gallops or clicks. No pedal edema. Gastrointestinal system: Abdomen is nondistended, soft and nontender. No organomegaly or masses felt. Normal bowel sounds heard. Central nervous system: Alert and oriented. No focal neurological deficits. Extremities: Symmetric 5 x 5 power. Skin: Very minimal erythema on left lower extremity Psychiatry: Judgement and insight appear normal. Mood & affect appropriate.   Data Reviewed: I have personally reviewed following labs and imaging studies  CBC: Recent Labs  Lab 08/04/20 0237 08/05/20 0245 08/06/20 0410 08/07/20 0219 08/08/20 0753  WBC 17.4* 18.3* 27.6* 13.5* 10.3  NEUTROABS  --   --   --   --  8.1*  HGB 9.1* 9.6* 11.8* 9.2* 9.2*  HCT 28.4* 30.5* 37.0* 28.6* 27.9*  MCV 91.6 91.9 92.5 92.3 92.4  PLT 352 392 338 286 638   Basic Metabolic Panel: Recent Labs  Lab 08/04/20 0237 08/05/20 0245 08/06/20 0410 08/07/20 0219 08/07/20 0821 08/08/20 0753  NA 132* 133* 130* 134*  --  135  K 4.7 4.7 4.9 4.5  --  5.2*  CL 101 102 103 105  --  102  CO2 19* 18* 19* 19*  --  22  GLUCOSE 354* 302* 228* 187*  --  233*  BUN 52* 46* 41* 47*  --  41*  CREATININE 1.98* 1.84* 1.71* 1.80*  --  1.62*  CALCIUM 7.9* 7.9* 7.6* 7.8*  --  8.3*  MG  --   --   --   --  2.2  --    GFR: Estimated Creatinine Clearance: 74.8 mL/min (A) (by C-G formula based on SCr of 1.62 mg/dL (H)). Liver Function Tests: No results for  input(s): AST, ALT, ALKPHOS, BILITOT, PROT, ALBUMIN in the last 168 hours. No results for input(s): LIPASE, AMYLASE in the last 168 hours. No results for input(s): AMMONIA in the last 168 hours. Coagulation Profile: No results for input(s): INR, PROTIME in the last 168 hours. Cardiac Enzymes: No results for input(s): CKTOTAL, CKMB, CKMBINDEX, TROPONINI in the last 168 hours. BNP (last 3 results) No results for input(s): PROBNP in the last 8760 hours. HbA1C: No results for input(s): HGBA1C in the last 72 hours. CBG: Recent Labs  Lab 08/07/20 1150 08/07/20 1714 08/07/20 2048 08/08/20 0647 08/08/20 1118  GLUCAP 190* 186* 195* 164* 217*   Lipid Profile: No results for input(s): CHOL, HDL, LDLCALC, TRIG, CHOLHDL, LDLDIRECT in the last 72 hours. Thyroid Function Tests: No results for input(s): TSH, T4TOTAL, FREET4, T3FREE, THYROIDAB in the last 72 hours. Anemia Panel: No results for input(s): VITAMINB12, FOLATE,  FERRITIN, TIBC, IRON, RETICCTPCT in the last 72 hours. Sepsis Labs: No results for input(s): PROCALCITON, LATICACIDVEN in the last 168 hours.  Recent Results (from the past 240 hour(s))  Culture, blood (routine x 2)     Status: None   Collection Time: 07/29/20  5:52 PM   Specimen: BLOOD  Result Value Ref Range Status   Specimen Description BLOOD LEFT ANTECUBITAL  Final   Special Requests   Final    BOTTLES DRAWN AEROBIC AND ANAEROBIC Blood Culture results may not be optimal due to an excessive volume of blood received in culture bottles   Culture   Final    NO GROWTH 5 DAYS Performed at [facility], [place] [address], [place], [place] [address]    Report Status 08/03/2020 FINAL  Final  Culture, blood (routine x 2)     Status: None   Collection Time: 07/29/20  5:57 PM   Specimen: BLOOD  Result Value Ref Range Status   Specimen Description BLOOD RIGHT ANTECUBITAL  Final   Special Requests   Final    BOTTLES DRAWN AEROBIC AND ANAEROBIC Blood Culture results may not  be optimal due to an excessive volume of blood received in culture bottles   Culture   Final    NO GROWTH 5 DAYS Performed at [facility], [place] [address], [place], [place] [address]    Report Status 08/03/2020 FINAL  Final  Urine Culture     Status: None   Collection Time: 07/30/20 12:22 PM   Specimen: Urine, Random  Result Value Ref Range Status   Specimen Description URINE, RANDOM  Final   Special Requests NONE  Final   Culture   Final    NO GROWTH Performed at [facility], [place] [address], [place], [place] [address]    Report Status 07/31/2020 FINAL  Final  Culture, Urine     Status: Abnormal   Collection Time: 08/06/20  4:31 PM   Specimen: Urine, Random  Result Value Ref Range Status   Specimen Description URINE, RANDOM  Final   Special Requests   Final    NONE Performed at [facility], [place] [address], [place], [place] [address]    Culture MULTIPLE SPECIES PRESENT, SUGGEST RECOLLECTION (A)  Final   Report Status 08/07/2020 FINAL  Final      Scheduled Meds: . apixaban  5 mg Oral BID  . atorvastatin  10 mg Oral Daily  . carvedilol  12.5 mg Oral BID WC  . cloNIDine  0.2 mg Oral BID  . diltiazem  180 mg Oral Daily  . insulin aspart  0-15 Units Subcutaneous TID WC  . insulin aspart  0-5 Units Subcutaneous QHS  . insulin aspart  5 Units Subcutaneous TID WC  . insulin glargine  35 Units Subcutaneous Daily  . polyethylene glycol  17 g Oral Daily  . senna-docusate  1 tablet Oral BID  . sodium chloride flush  3 mL Intravenous Q12H   Continuous Infusions: . sodium chloride Stopped (08/06/20 0656)  .  ceFAZolin (ANCEF) IV Stopped (08/08/20 0626)     LOS: 13 days   Time spent: 29  min  Darliss Cheney, MD [facility]12/10/2019, 11:27 AM

## 2020-08-08 NOTE — TOC Progression Note (Signed)
Transition of Care [facility]) - Progression Note    Patient Details  Name: Gerald Day MRN: [id number] Date of Birth: 1956/04/28  Transition of Care [facility]) CM/SW Contact  Sharlet Salina Mila Homer, LCSW Phone Number: 08/08/2020, 12:33 PM  Clinical Narrative:  CSW talked with admissions director of [facility] ([phone])- Irven Shelling this morning regarding patient and his choice of their facility for ST rehab. Per Jackelyn Poling, they can accept patient. CSW contacted [facility] and confirmed that they do not manage patient. [place] contacted, updated and she will initiate insurance auth with [facility].  CSW will continue to follow and provide SW intervention services as needed through discharge to [facility] in [place].      Expected Discharge Plan: [place] Barriers to Discharge: Continued Medical Work up  Expected Discharge Plan and Services Expected Discharge Plan: [place] Choice: [place] arrangements for the past 2 months: Single Family Home Expected Discharge Date: 08/08/20                                   Social Determinants of Health (SDOH) Interventions  No SDOH interventions needed or requested at this time.  Readmission Risk Interventions No flowsheet data found.

## 2020-08-08 NOTE — Plan of Care (Signed)
  Problem: Clinical Measurements: Goal: Ability to maintain clinical measurements within normal limits will improve Outcome: Progressing   Problem: Clinical Measurements: Goal: Will remain free from infection Outcome: Progressing   

## 2020-08-08 NOTE — Plan of Care (Signed)
  Problem: Activity: Goal: Risk for activity intolerance will decrease Outcome: Progressing   

## 2020-08-09 DIAGNOSIS — T560X1D Toxic effect of lead and its compounds, accidental (unintentional), subsequent encounter: Secondary | ICD-10-CM | POA: Diagnosis not present

## 2020-08-09 DIAGNOSIS — I10 Essential (primary) hypertension: Secondary | ICD-10-CM | POA: Diagnosis not present

## 2020-08-09 DIAGNOSIS — R42 Dizziness and giddiness: Secondary | ICD-10-CM | POA: Diagnosis not present

## 2020-08-09 DIAGNOSIS — R0689 Other abnormalities of breathing: Secondary | ICD-10-CM | POA: Diagnosis not present

## 2020-08-09 DIAGNOSIS — M6281 Muscle weakness (generalized): Secondary | ICD-10-CM | POA: Diagnosis not present

## 2020-08-09 DIAGNOSIS — E1169 Type 2 diabetes mellitus with other specified complication: Secondary | ICD-10-CM | POA: Diagnosis not present

## 2020-08-09 DIAGNOSIS — R600 Localized edema: Secondary | ICD-10-CM | POA: Diagnosis not present

## 2020-08-09 DIAGNOSIS — N1831 Chronic kidney disease, stage 3a: Secondary | ICD-10-CM | POA: Diagnosis not present

## 2020-08-09 DIAGNOSIS — I110 Hypertensive heart disease with heart failure: Secondary | ICD-10-CM | POA: Diagnosis not present

## 2020-08-09 DIAGNOSIS — I480 Paroxysmal atrial fibrillation: Secondary | ICD-10-CM | POA: Diagnosis not present

## 2020-08-09 DIAGNOSIS — M1A0710 Idiopathic chronic gout, right ankle and foot, without tophus (tophi): Secondary | ICD-10-CM | POA: Diagnosis not present

## 2020-08-09 DIAGNOSIS — R55 Syncope and collapse: Secondary | ICD-10-CM | POA: Diagnosis not present

## 2020-08-09 DIAGNOSIS — E785 Hyperlipidemia, unspecified: Secondary | ICD-10-CM | POA: Diagnosis not present

## 2020-08-09 DIAGNOSIS — M109 Gout, unspecified: Secondary | ICD-10-CM | POA: Diagnosis not present

## 2020-08-09 DIAGNOSIS — L89893 Pressure ulcer of other site, stage 3: Secondary | ICD-10-CM | POA: Diagnosis not present

## 2020-08-09 DIAGNOSIS — L0390 Cellulitis, unspecified: Secondary | ICD-10-CM | POA: Diagnosis not present

## 2020-08-09 DIAGNOSIS — R279 Unspecified lack of coordination: Secondary | ICD-10-CM | POA: Diagnosis not present

## 2020-08-09 DIAGNOSIS — G4733 Obstructive sleep apnea (adult) (pediatric): Secondary | ICD-10-CM | POA: Diagnosis not present

## 2020-08-09 DIAGNOSIS — I509 Heart failure, unspecified: Secondary | ICD-10-CM | POA: Diagnosis not present

## 2020-08-09 DIAGNOSIS — E119 Type 2 diabetes mellitus without complications: Secondary | ICD-10-CM | POA: Diagnosis not present

## 2020-08-09 DIAGNOSIS — L03116 Cellulitis of left lower limb: Secondary | ICD-10-CM | POA: Diagnosis not present

## 2020-08-09 DIAGNOSIS — Z7401 Bed confinement status: Secondary | ICD-10-CM | POA: Diagnosis not present

## 2020-08-09 DIAGNOSIS — I5032 Chronic diastolic (congestive) heart failure: Secondary | ICD-10-CM | POA: Diagnosis not present

## 2020-08-09 DIAGNOSIS — I484 Atypical atrial flutter: Secondary | ICD-10-CM | POA: Diagnosis not present

## 2020-08-09 DIAGNOSIS — Z7901 Long term (current) use of anticoagulants: Secondary | ICD-10-CM | POA: Diagnosis not present

## 2020-08-09 DIAGNOSIS — Z743 Need for continuous supervision: Secondary | ICD-10-CM | POA: Diagnosis not present

## 2020-08-09 DIAGNOSIS — R110 Nausea: Secondary | ICD-10-CM | POA: Diagnosis not present

## 2020-08-09 DIAGNOSIS — R29898 Other symptoms and signs involving the musculoskeletal system: Secondary | ICD-10-CM | POA: Diagnosis not present

## 2020-08-09 DIAGNOSIS — E109 Type 1 diabetes mellitus without complications: Secondary | ICD-10-CM | POA: Diagnosis not present

## 2020-08-09 DIAGNOSIS — N179 Acute kidney failure, unspecified: Secondary | ICD-10-CM | POA: Diagnosis not present

## 2020-08-09 DIAGNOSIS — R5381 Other malaise: Secondary | ICD-10-CM | POA: Diagnosis not present

## 2020-08-09 DIAGNOSIS — E11622 Type 2 diabetes mellitus with other skin ulcer: Secondary | ICD-10-CM | POA: Diagnosis not present

## 2020-08-09 DIAGNOSIS — A419 Sepsis, unspecified organism: Secondary | ICD-10-CM | POA: Diagnosis not present

## 2020-08-09 DIAGNOSIS — M1019 Lead-induced gout, multiple sites: Secondary | ICD-10-CM | POA: Diagnosis not present

## 2020-08-09 DIAGNOSIS — I4892 Unspecified atrial flutter: Secondary | ICD-10-CM | POA: Diagnosis not present

## 2020-08-09 DIAGNOSIS — M2550 Pain in unspecified joint: Secondary | ICD-10-CM | POA: Diagnosis not present

## 2020-08-09 DIAGNOSIS — I951 Orthostatic hypotension: Secondary | ICD-10-CM | POA: Diagnosis not present

## 2020-08-09 DIAGNOSIS — Z79899 Other long term (current) drug therapy: Secondary | ICD-10-CM | POA: Diagnosis not present

## 2020-08-09 LAB — CBC WITH DIFFERENTIAL/PLATELET
Abs Immature Granulocytes: 0.18 10*3/uL — ABNORMAL HIGH (ref 0.00–0.07)
Basophils Absolute: 0 10*3/uL (ref 0.0–0.1)
Basophils Relative: 0 %
Eosinophils Absolute: 0.1 10*3/uL (ref 0.0–0.5)
Eosinophils Relative: 1 %
HCT: 26.5 % — ABNORMAL LOW (ref 39.0–52.0)
Hemoglobin: 8.8 g/dL — ABNORMAL LOW (ref 13.0–17.0)
Immature Granulocytes: 2 %
Lymphocytes Relative: 10 %
Lymphs Abs: 0.9 10*3/uL (ref 0.7–4.0)
MCH: 30.3 pg (ref 26.0–34.0)
MCHC: 33.2 g/dL (ref 30.0–36.0)
MCV: 91.4 fL (ref 80.0–100.0)
Monocytes Absolute: 1.1 10*3/uL — ABNORMAL HIGH (ref 0.1–1.0)
Monocytes Relative: 12 %
Neutro Abs: 7.4 10*3/uL (ref 1.7–7.7)
Neutrophils Relative %: 75 %
Platelets: 265 10*3/uL (ref 150–400)
RBC: 2.9 MIL/uL — ABNORMAL LOW (ref 4.22–5.81)
RDW: 14.4 % (ref 11.5–15.5)
WBC: 9.8 10*3/uL (ref 4.0–10.5)
nRBC: 0 % (ref 0.0–0.2)

## 2020-08-09 LAB — BASIC METABOLIC PANEL
Anion gap: 9 (ref 5–15)
BUN: 34 mg/dL — ABNORMAL HIGH (ref 8–23)
CO2: 20 mmol/L — ABNORMAL LOW (ref 22–32)
Calcium: 8.2 mg/dL — ABNORMAL LOW (ref 8.9–10.3)
Chloride: 102 mmol/L (ref 98–111)
Creatinine, Ser: 1.5 mg/dL — ABNORMAL HIGH (ref 0.61–1.24)
GFR, Estimated: 51 mL/min — ABNORMAL LOW (ref >60)
Glucose, Bld: 125 mg/dL — ABNORMAL HIGH (ref 70–99)
Potassium: 4.9 mmol/L (ref 3.5–5.1)
Sodium: 131 mmol/L — ABNORMAL LOW (ref 135–145)

## 2020-08-09 LAB — GLUCOSE, CAPILLARY
Glucose-Capillary: 125 mg/dL — ABNORMAL HIGH (ref 70–99)
Glucose-Capillary: 153 mg/dL — ABNORMAL HIGH (ref 70–99)
Glucose-Capillary: 118 mg/dL — ABNORMAL HIGH (ref 70–99)
Glucose-Capillary: 165 mg/dL — ABNORMAL HIGH (ref 70–99)

## 2020-08-09 LAB — RESP PANEL BY RT-PCR (FLU A&B, COVID) ARPGX2
Influenza A by PCR: NEGATIVE
Influenza B by PCR: NEGATIVE
SARS Coronavirus 2 by RT PCR: NEGATIVE

## 2020-08-09 MED ORDER — APIXABAN 5 MG PO TABS: 5.0000 mg | ORAL_TABLET | Freq: Two times a day (BID) | ORAL | 0 refills | Status: DC

## 2020-08-09 MED ORDER — CARVEDILOL 12.5 MG PO TABS: 12.5000 mg | ORAL_TABLET | Freq: Two times a day (BID) | ORAL | 0 refills | Status: DC

## 2020-08-09 MED ORDER — OXYCODONE HCL 5 MG PO TABS: 5.0000 mg | ORAL_TABLET | Freq: Four times a day (QID) | ORAL | 0 refills | Status: DC | PRN

## 2020-08-09 MED ORDER — CLONIDINE HCL 0.2 MG PO TABS: 0.2000 mg | ORAL_TABLET | Freq: Two times a day (BID) | ORAL | 0 refills | Status: DC

## 2020-08-09 MED ORDER — FUROSEMIDE 80 MG PO TABS: 80.0000 mg | ORAL_TABLET | Freq: Every day | ORAL | 0 refills | Status: DC | PRN

## 2020-08-09 MED ORDER — METFORMIN HCL 500 MG PO TABS: 1000.0000 mg | ORAL_TABLET | Freq: Two times a day (BID) | ORAL | 0 refills | Status: DC

## 2020-08-09 MED ORDER — DILTIAZEM HCL ER COATED BEADS 180 MG PO CP24: 180.0000 mg | ORAL_CAPSULE | Freq: Every day | ORAL | 0 refills | Status: DC

## 2020-08-09 NOTE — Progress Notes (Signed)
DISCHARGE NOTE SNF Gerald Day to be discharged Skilled nursing facility per MD order. Patient verbalized understanding.  Skin clean, dry and intact without evidence of skin break down, no evidence of skin tears noted. IV catheter discontinued intact. Site without signs and symptoms of complications. Dressing and pressure applied. Pt denies pain at the site currently. No complaints noted.  Patient free of lines, drains, and wounds.   Discharge packet assembled. An After Visit Summary (AVS) was printed and given to the EMS personnel. Patient escorted via stretcher and discharged to [facility] via ambulance. Report called to accepting facility; all questions and concerns addressed.   RN gave report to receiving nurse Lyna Poser at [facility] in [place] and receiving RN was satisfied with no remaining questions of SBAR report. RN will continue to monitor this patient until they discharge from the hospital.  Beatris Ship, RN

## 2020-08-09 NOTE — Progress Notes (Signed)
PT Cancellation Note  Patient Details Name: Gerald Day MRN: [id number] DOB: [birth year]   Cancelled Treatment:    Reason Eval/Treat Not Completed: Other (comment) per RN and OT, patient likely discharging to SNF today. Holding PT for now per dept policy.    Windell Norfolk, DPT, PN1   Supplemental Physical Therapist [facility]    Pager [phone] Acute Rehab Office [phone]

## 2020-08-09 NOTE — Plan of Care (Signed)
  Problem: Clinical Measurements: Goal: Ability to maintain clinical measurements within normal limits will improve Outcome: Adequate for Discharge Goal: Will remain free from infection Outcome: Adequate for Discharge   Problem: Activity: Goal: Risk for activity intolerance will decrease Outcome: Adequate for Discharge   Problem: Elimination: Goal: Will not experience complications related to bowel motility Outcome: Adequate for Discharge   Problem: Pain Managment: Goal: General experience of comfort will improve Outcome: Adequate for Discharge   Problem: Safety: Goal: Ability to remain free from injury will improve Outcome: Adequate for Discharge   Problem: Clinical Measurements: Goal: Ability to avoid or minimize complications of infection will improve Outcome: Adequate for Discharge   Problem: Skin Integrity: Goal: Skin integrity will improve Outcome: Adequate for Discharge

## 2020-08-09 NOTE — Discharge Summary (Signed)
Physician Discharge Summary  Gerald Day QPR:[id number] DOB: [birth year] DOA: 07/26/2020  PCP: Biagio Borg, MD  Admit date: 07/26/2020 Discharge date: 08/09/2020  Admitted From: Home Disposition: SNF  Recommendations for Outpatient Follow-up:  1. Follow up with PCP in 1-2 weeks 2. Follow-up with cardiology in 2 to 4 weeks 3. Please obtain BMP/CBC in one week 4. Please follow up with your PCP on the following pending results: Unresulted Labs (From admission, onward)          Start     Ordered   08/09/20 0940  Resp Panel by RT-PCR (Flu A&B, Covid) Nasopharyngeal Swab  (Tier 2 (TAT 2 hrs))  Once,   R       Question Answer Comment  Is this test for diagnosis or screening Screening   Symptomatic for COVID-19 as defined by CDC No   Hospitalized for COVID-19 No   Admitted to ICU for COVID-19 No   Previously tested for COVID-19 Yes   Resident in a congregate (group) care setting No   Employed in healthcare setting No   Has patient completed COVID vaccination(s) (2 doses of Pfizer/Moderna 1 dose of [employer]) Unknown      08/09/20 [place]: None Equipment/Devices: None  Discharge Condition: Stable CODE STATUS: Full code Diet recommendation: Cardiac  Subjective: Seen and examined.  Feels much better.  No complaints.  Brief/Interim Summary: 64/M with morbid obesity, OSA, type 2 diabetes mellitus, chronic diastolic CHF, chronic lymphedema was admitted with sepsis, left lower extremity cellulitis complicated by acute kidney injury. -ID recommended to continue IV Ancef and transition to Keflex at discharge -In addition noted to have atrial flutter this hospitalization, cardiology consulted underwent cardioversion, now remains in sinus rhythm.  Patient is finally afebrile with no more leukocytosis and doing well and sepsis as well as cellulitis has resolved.  He is being discharged to SNF in stable condition  Sepsis secondary to left lower extremity  cellulitis -Had been improving clinically on IV Ancef, erythema, edema and tenderness has improved considerably however he had low-grade fevers overnight on 08/05/2020 with significant worsening leukocytosis on 08/06/2020.  Repeat CT of the left lower extremity was done which ruled out abscess again.  CT was already done on 07/29/2020 which was negative for abscess as well. Leukocytosis resolved. He has remained afebrile since then.  Received antibiotics for more than 10 days.  No further antibiotic prescribed or indicated at discharge.  Recommend to keep his left lower extremity elevated.  Acute gout flare -Had considerable tenderness of both ankles, right MTP joint, uric acid greater than 10 -Known history of gout, acute flare suspected, clinically improved, treated with 3 days of prednisone and colchicine, then discontinued  Atrial flutter with RVR -cards was following, signed off on 08/06/2020. -Remains on Cardizem, metoprolol, DC cardioverted back into sinus rhythm 11/25 Medication Recommendations per cardiology:Eliquis 5mg  BID, Lipitor 10mg  qd, Coreg 12.5mg  BID, Clonidine 12.5mg  BID, and Cardizem CD 180mg  qd  Chronic diastolic CHF -Diuretics were held due to AKI.  Kidney function improving but still not back to baseline.  Recommend resuming diuretics in 3 days after checking renal function if renal function is improving.  AKI  -Creatinine was 2.6 on admission, baseline around 1.1.  Creatinine is improving and currently 1.6, still not back to baseline.  Recommend resuming diuretics in 3 days after checking BMP if renal function is back to baseline and also recommend holding Metformin for another  week.  His AKI was likely secondary to ATN due to sepsis.  Type 2 diabetes mellitus -Hemoglobin A1c is 7.0,  Resume home medications except Metformin which can be resumed after a week if renal function improved.  Morbid obesity OSA -Continue CPAP, needs to lose  weight  Hypertension Norvasc discontinued.  Continue medications as mentioned above.   Discharge Diagnoses:  Principal Problem:   Sepsis due to cellulitis [facility]) Active Problems:   Diabetes mellitus type 2 in obese (HCC)   Hyperlipidemia   Morbid obesity (HCC)   OSA (obstructive sleep apnea)   Essential hypertension   AKI (acute kidney injury) ([place])   Stage 3a chronic kidney disease (HCC)   Atypical atrial flutter (HCC)    Discharge Instructions  Discharge Instructions    Amb referral to [facility]   Complete by: As directed    Discharge patient   Complete by: As directed    Discharge disposition: 03-Skilled Nursing Facility   Discharge patient date: 08/07/2020     Allergies as of 08/09/2020      Reactions   Penicillins Other (See Comments)   Unknown childhood allergic reaction Has patient had a PCN reaction causing immediate rash, facial/tongue/throat swelling, SOB or lightheadedness with hypotension: Unknown Has patient had a PCN reaction causing severe rash involving mucus membranes or skin necrosis: Unknown Has patient had a PCN reaction that required hospitalization: No Has patient had a PCN reaction occurring within the last 10 years: No If all of the above answers are "NO", then may proceed with Cephalosporin use.   Tizanidine Other (See Comments)   07/26/20: Pt does not recognize drug and does not remember being allergic to it.      Medication List    STOP taking these medications   amLODipine 10 MG tablet Commonly known as: NORVASC   telmisartan 80 MG tablet Commonly known as: MICARDIS     TAKE these medications   apixaban 5 MG Tabs tablet Commonly known as: ELIQUIS Take 1 tablet (5 mg total) by mouth 2 (two) times daily.   atorvastatin 10 MG tablet Commonly known as: LIPITOR TAKE 1 TABLET (10 MG TOTAL) BY MOUTH DAILY.   carvedilol 12.5 MG tablet Commonly known as: COREG Take 1 tablet (12.5 mg total) by mouth 2 (two) times daily with a  meal.   cloNIDine 0.2 MG tablet Commonly known as: CATAPRES Take 1 tablet (0.2 mg total) by mouth 2 (two) times daily. What changed:   medication strength  how much to take  when to take this   diltiazem 180 MG 24 hr capsule Commonly known as: CARDIZEM CD Take 1 capsule (180 mg total) by mouth daily.   furosemide 80 MG tablet Commonly known as: LASIX Take 1 tablet (80 mg total) by mouth daily as needed. Start taking on: August 12, 2020 What changed: These instructions start on August 12, 2020. If you are unsure what to do until then, ask your doctor or other care provider.   glipiZIDE 5 MG 24 hr tablet Commonly known as: GLUCOTROL XL TAKE 1 TABLET BY MOUTH EVERY DAY WITH BREAKFAST   indomethacin 50 MG capsule Commonly known as: INDOCIN TAKE 1 CAPSULE (50 MG TOTAL) BY MOUTH 3 (THREE) TIMES DAILY AS NEEDED. What changed: reasons to take this   metFORMIN 500 MG tablet Commonly known as: GLUCOPHAGE Take 2 tablets (1,000 mg total) by mouth 2 (two) times daily with a meal. Start taking on: August 16, 2020 What changed: These instructions start on August 16, 2020. If you are unsure what to do until then, ask your doctor or other care provider.   oxyCODONE 5 MG immediate release tablet Commonly known as: Oxy IR/ROXICODONE Take 1 tablet (5 mg total) by mouth every 6 (six) hours as needed for moderate pain.   potassium chloride SA 20 MEQ tablet Commonly known as: Klor-Con M20 Take 1 tablet (20 mEq total) by mouth daily.   Thera-D 2000 50 MCG (2000 UT) Tabs Generic drug: Cholecalciferol 1 tab by mouth once daily   vitamin B-12 1000 MCG tablet Commonly known as: CYANOCOBALAMIN Take 1 tablet (1,000 mcg total) by mouth daily.       Contact information for follow-up providers    Erlene Quan, PA-C. Go on 08/28/2020.   Specialties: Cardiology, Radiology Why: @9 :45am for hospital follow up with Dr. Rosezella Florida PA Contact information: [place] [address] [phone]        Biagio Borg, MD Follow up in 1 week(s).   Specialties: Internal Medicine, Radiology Contact information: [place] [address] [phone]        Minus Breeding, MD .   Specialty: Cardiology Contact information: [address] [place] [address] [phone]            Contact information for after-discharge care    [facility] .   Service: Skilled Nursing Contact information: [place] [phone]                 Allergies  Allergen Reactions  . Penicillins Other (See Comments)    Unknown childhood allergic reaction Has patient had a PCN reaction causing immediate rash, facial/tongue/throat swelling, SOB or lightheadedness with hypotension: Unknown Has patient had a PCN reaction causing severe rash involving mucus membranes or skin necrosis: Unknown Has patient had a PCN reaction that required hospitalization: No Has patient had a PCN reaction occurring within the last 10 years: No If all of the above answers are "NO", then may proceed with Cephalosporin use.   . Tizanidine Other (See Comments)    07/26/20: Pt does not recognize drug and does not remember being allergic to it.    Consultations: ID and cardiology   Procedures/Studies: DG Chest 2 View  Result Date: 07/26/2020 CLINICAL DATA:  Chest pain and hypertension 2 days with shortness of breath. EXAM: CHEST - 2 VIEW COMPARISON:  12/22/2004 FINDINGS: Lungs are adequately inflated without focal airspace consolidation or effusion. Mild stable cardiomegaly. Remainder of the exam is unchanged. IMPRESSION: No active cardiopulmonary disease. Electronically Signed   By: Marin Olp M.D.   On: 07/26/2020 14:15   CT TIBIA FIBULA LEFT WO CONTRAST  Result Date: 08/07/2020 CLINICAL DATA:  Left lower extremity cellulitis with worsening  leukocytosis EXAM: CT OF THE LOWER LEFT EXTREMITY WITHOUT CONTRAST TECHNIQUE: Multidetector CT imaging of the lower left extremity was performed according to the standard protocol. COMPARISON:  07/29/2020 FINDINGS: Bones/Joint/Cartilage Moderate left knee joint effusion, minimally increased from prior. No gas in the knee joint identified. Marginal spurring in the knee joint indicating osteoarthritis. No bony destructive findings along the tibia/fibula to suggest osteomyelitis. No fracture or acute bony findings. Small well corticated ossicle below the tip of the lateral malleolus and below the tip medial malleolus. Stable appearance of ossification extension along a fascial investiture on image 45 of series 7, this is not thought to represent periosteal reaction. Severe medial compartmental articular space  loss. Geode along the tibial spine. Ligaments Suboptimally assessed by CT. Muscles and Tendons Regional musculature similar to prior with no evidence effacement adipose marbling in the musculature to indicate significant compartmental edema. No findings intramuscular hematoma. No abscess along the regional musculature. Soft tissues Circumferential subcutaneous edema and cutaneous thickening in the distal thigh in the calf, improved from 07/29/2020 especially along the distal calf. Slight irregularity of the skin surface medially along the distal ** <=- **' laterally along the distal calf may be incidental or due to small cutaneous wounds, correlate with visual inspection. Subcutaneous edema extends into the foot. No drainable abscess observed. Medial subcutaneous venous varicosities along the calf. Atherosclerotic vascular calcification. IMPRESSION: 1. Circumferential subcutaneous edema and cutaneous thickening in the distal thigh in the calf, improved from 07/29/2020. No drainable abscess observed. 2. No bony destructive findings along the tibia/fibula to suggest osteomyelitis. 3. Moderate left knee joint  effusion, minimally increased from prior. 4. Severe medial compartmental articular space loss. 5. Medial subcutaneous venous varicosities along the calf. 6. Slight irregularity of the skin surface medially along the distal calf may be incidental or due to small cutaneous wounds, correlate with visual inspection. 7. Atherosclerosis. 8. Small bony extension from the tibia along a fascial investiture posteriorly, unchanged. Electronically Signed   By: Van Clines M.D.   On: 08/07/2020 07:55   CT TIBIA FIBULA LEFT WO CONTRAST  Result Date: 07/30/2020 CLINICAL DATA:  Left lower extremity pain and swelling.  Sepsis. EXAM: CT OF THE LOWER LEFT EXTREMITY WITHOUT CONTRAST TECHNIQUE: Multidetector CT imaging of the lower left extremity was performed according to the standard protocol. COMPARISON:  None. FINDINGS: Severe and diffuse skin thickening, subcutaneous soft tissue swelling/edema/fluid and what appear to be wounds along the lateral aspect of the lower extremity. I do not see a discrete fluid collection but exam limited without contrast to assess for abscess. No findings suspicious for myofasciitis or pyomyositis. Moderate knee and ankle joint degenerative changes but no destructive bony changes to suggest septic arthritis. There is a moderate-sized knee joint effusion noted. No destructive bony changes to suggest osteomyelitis. There is a bony exostosis projecting off the posteromedial aspect of the tibia which is likely due to a fascial attachment site with calcification. Age advanced vascular calcifications. IMPRESSION: 1. Severe and diffuse skin thickening, subcutaneous soft tissue swelling/edema/fluid and what appear to be wounds along the lateral aspect of the lower extremity. No discrete fluid collection but exam limited without contrast to assess for abscess. 2. No findings suspicious for myofasciitis or pyomyositis. 3. No CT findings suspicious for septic arthritis or osteomyelitis. 4.  Moderate-sized knee joint effusion. 5. Age advanced vascular calcifications. Electronically Signed   By: Marijo Sanes M.D.   On: 07/30/2020 07:49   CT FOOT LEFT WO CONTRAST  Result Date: 07/30/2020 CLINICAL DATA:  Left foot pain and swelling. EXAM: CT OF THE LEFT FOOT WITHOUT CONTRAST TECHNIQUE: Multidetector CT imaging of the left foot was performed according to the standard protocol. Multiplanar CT image reconstructions were also generated. COMPARISON:  None. FINDINGS: Diffuse and marked subcutaneous soft tissue swelling/edema/fluid most notable along the dorsum of the foot. No discrete fluid collection to suggest a drainable abscess but exam limited without contrast. No findings suspicious for myofasciitis or pyomyositis. Degenerative changes involving the ankle and foot but no CT findings to suggest septic arthritis or osteomyelitis. Age advanced vascular calcifications. IMPRESSION: 1. Diffuse and marked subcutaneous soft tissue swelling/edema/fluid most notable along the dorsum of the foot. No discrete fluid collection  to suggest a drainable abscess but exam limited without contrast. 2. No findings suspicious for myofasciitis or pyomyositis. 3. Degenerative changes involving the ankle and foot but no CT findings to suggest septic arthritis or osteomyelitis. 4. Age advanced vascular calcifications. Electronically Signed   By: Marijo Sanes M.D.   On: 07/30/2020 07:52   MR TOES LEFT W WO CONTRAST  Result Date: 07/27/2020 CLINICAL DATA:  Diabetic with massive foot swelling. EXAM: MRI OF THE LEFT TOES WITHOUT AND WITH CONTRAST TECHNIQUE: Multiplanar, multisequence MR imaging of the left foot was performed both before and after administration of intravenous contrast. CONTRAST:  71mL GADAVIST GADOBUTROL 1 MMOL/ML IV SOLN COMPARISON:  None. FINDINGS: Extensive subcutaneous soft tissue swelling/edema/fluid most notably along the dorsum of the foot. No discrete rim enhancing fluid collection to suggest a  drainable soft tissue abscess. No obvious open wound or gas in the soft tissues. The bony structures are intact. I do not see any findings suspicious for osteomyelitis or septic arthritis. Diffuse fatty change involving the forefoot musculature. Mild diffuse myositis but no findings for pyomyositis. IMPRESSION: 1. Extensive subcutaneous soft tissue swelling/edema/fluid suggesting severe cellulitis. No discrete rim enhancing fluid collection to suggest a drainable soft tissue abscess. 2. No findings for osteomyelitis or septic arthritis. Electronically Signed   By: Marijo Sanes M.D.   On: 07/27/2020 07:38   DG CHEST PORT 1 VIEW  Result Date: 07/29/2020 CLINICAL DATA:  64 year old male with fever. EXAM: PORTABLE CHEST 1 VIEW COMPARISON:  Chest radiograph dated 07/26/2020. FINDINGS: No focal consolidation, pleural effusion or pneumothorax. The left costophrenic angle has been excluded from the image and not evaluated. There is stable cardiomegaly. Mild bilateral hilar prominence suggestive of pulmonary hypertension. No acute osseous pathology. IMPRESSION: No acute cardiopulmonary process.  No interval change. Electronically Signed   By: Anner Crete M.D.   On: 07/29/2020 20:18   ECHOCARDIOGRAM COMPLETE  Result Date: 07/29/2020    ECHOCARDIOGRAM REPORT   [name] Date of Exam: 07/29/2020 Medical Rec #:  [id number]        Height:       72.0 in Accession #:    [id number]       Weight:       385.1 lb Date of Birth:  [birth year]        BSA:          2.815 m Patient Age:    11 years         BP:           144/93 mmHg Patient Gender: M                HR:           120 bpm. Exam Location:  Inpatient Procedure: 2D Echo and Intracardiac Opacification Agent Indications:    Abnormal ECG  History:        Patient has prior history of Echocardiogram examinations. CHF;                 Risk Factors:Hypertension, Dyslipidemia and Diabetes.  Sonographer:    Mikki Santee RDCS (AE) Referring Phys:  [id number] [place]  1. Left ventricular ejection fraction, by estimation, is 55 to 60%. The left ventricle has normal function. The left ventricle has no regional wall motion abnormalities. There is mild left ventricular hypertrophy. Indeterminate diastolic filling due to E-A fusion.  2. Right ventricular systolic function was not well visualized. The right ventricular size is not well visualized.  3. Left atrial size was mildly dilated.  4. Right atrial size was mildly dilated.  5. The mitral valve is grossly normal. Trivial mitral valve regurgitation.  6. The aortic valve is tricuspid. Aortic valve regurgitation is not visualized.  7. Aortic dilatation noted. There is mild dilatation of the aortic root, measuring 41 mm. There is mild dilatation of the ascending aorta, measuring 41 mm. Comparison(s): No prior Echocardiogram. FINDINGS  Left Ventricle: Left ventricular ejection fraction, by estimation, is 55 to 60%. The left ventricle has normal function. The left ventricle has no regional wall motion abnormalities. Definity contrast agent was given IV to delineate the left ventricular  endocardial borders. The left ventricular internal cavity size was normal in size. There is mild left ventricular hypertrophy. Indeterminate diastolic filling due to E-A fusion. Right Ventricle: The right ventricular size is not well visualized. Right vetricular wall thickness was not well visualized. Right ventricular systolic function was not well visualized. Left Atrium: Left atrial size was mildly dilated. Right Atrium: Right atrial size was mildly dilated. Pericardium: There is no evidence of pericardial effusion. Mitral Valve: The mitral valve is grossly normal. Trivial mitral valve regurgitation. Tricuspid Valve: The tricuspid valve is grossly normal. Tricuspid valve regurgitation is trivial. Aortic Valve: The aortic valve is tricuspid. Aortic valve regurgitation is not visualized. Pulmonic Valve: The pulmonic  valve was normal in structure. Pulmonic valve regurgitation is not visualized. Aorta: Aortic dilatation noted. There is mild dilatation of the aortic root, measuring 41 mm. There is mild dilatation of the ascending aorta, measuring 41 mm. IAS/Shunts: No atrial level shunt detected by color flow Doppler.  LEFT VENTRICLE PLAX 2D LVIDd:         6.60 cm LVIDs:         4.90 cm LV PW:         1.20 cm LV IVS:        1.10 cm LVOT diam:     2.60 cm LV SV:         95 LV SV Index:   34 LVOT Area:     5.31 cm  RIGHT VENTRICLE TAPSE (M-mode): 1.3 cm LEFT ATRIUM              Index       RIGHT ATRIUM           Index LA diam:        4.90 cm  1.74 cm/m  RA Area:     29.20 cm LA Vol (A2C):   76.5 ml  27.18 ml/m RA Volume:   96.20 ml  34.18 ml/m LA Vol (A4C):   102.0 ml 36.24 ml/m LA Biplane Vol: 92.1 ml  32.72 ml/m  AORTIC VALVE LVOT Vmax:   94.20 cm/s LVOT Vmean:  69.100 cm/s LVOT VTI:    0.178 m  AORTA Ao Root diam: 4.10 cm  SHUNTS Systemic VTI:  0.18 m Systemic Diam: 2.60 cm Lyman Bishop MD Electronically signed by Lyman Bishop MD Signature Date/Time: 07/29/2020/12:52:55 PM    Final    ECHO TEE  Result Date: 08/02/2020    TRANSESOPHOGEAL ECHO REPORT   [name] Date of Exam: 08/02/2020 Medical Rec #:  [id number]        Height:       72.0 in Accession #:    [id number]       Weight:       405.0 lb Date of Birth:  May 06, 1956        BSA:  2.876 m Patient Age:    64 years         BP:           115/48 mmHg Patient Gender: M                HR:           63 bpm. Exam Location:  Inpatient Procedure: Transesophageal Echo, Color Doppler and Cardiac Doppler Indications:     Atrial Flutter  History:         Patient has prior history of Echocardiogram examinations, most                  recent 07/29/2020. Risk Factors:Hypertension, Diabetes,                  Dyslipidemia and Sleep Apnea.  Sonographer:     Darlina Sicilian RDCS Referring Phys:  [id number] Nadean Corwin HILTY Diagnosing Phys: Sanda Klein MD  PROCEDURE: After discussion of the risks and benefits of a TEE, an informed consent was obtained. TEE procedure time was 29 minutes. The transesophogeal probe was passed without difficulty through the esophogus of the patient. Imaged were obtained with the patient in a supine position. Sedation performed by different physician. The patient was monitored while under deep sedation. Anesthestetic sedation was provided intravenously by Anesthesiology: 288mg  of Propofol. Image quality was good. The patient's vital signs; including heart rate, blood pressure, and oxygen saturation; remained stable throughout the procedure. The patient developed no complications during the procedure. A direct current cardioversion was performed at 200 joules with 1 attempt. IMPRESSIONS  1. Left ventricular ejection fraction, by estimation, is 50 to 55%. The left ventricle has low normal function. The left ventricle has no regional wall motion abnormalities.  2. Right ventricular systolic function is normal. The right ventricular size is normal. There is normal pulmonary artery systolic pressure.  3. No left atrial/left atrial appendage thrombus was detected. The LAA emptying velocity was 60 cm/s.  4. The mitral valve is normal in structure. Trivial mitral valve regurgitation. No evidence of mitral stenosis.  5. The aortic valve is normal in structure. Aortic valve regurgitation is not visualized. No aortic stenosis is present.  6. The inferior vena cava is normal in size with greater than 50% respiratory variability, suggesting right atrial pressure of 3 mmHg.  7. Evidence of atrial level shunting detected by color flow Doppler. There is a very small patent foramen ovale with a tiny amount of bidirectional shunting across atrial septum. Conclusion(s)/Recommendation(s): Normal biventricular function without evidence of hemodynamically significant valvular heart disease. FINDINGS  Left Ventricle: Left ventricular ejection fraction, by  estimation, is 50 to 55%. The left ventricle has low normal function. The left ventricle has no regional wall motion abnormalities. The left ventricular internal cavity size was normal in size. There is no left ventricular hypertrophy. Right Ventricle: The right ventricular size is normal. No increase in right ventricular wall thickness. Right ventricular systolic function is normal. There is normal pulmonary artery systolic pressure. The tricuspid regurgitant velocity is 2.46 m/s, and  with an assumed right atrial pressure of 3 mmHg, the estimated right ventricular systolic pressure is 97.0 mmHg. Left Atrium: Left atrial size was normal in size. No left atrial/left atrial appendage thrombus was detected. The LAA emptying velocity was 60 cm/s. Right Atrium: Right atrial size was normal in size. Pericardium: There is no evidence of pericardial effusion. Mitral Valve: The mitral valve is normal in structure. Trivial mitral valve regurgitation. No evidence  of mitral valve stenosis. Tricuspid Valve: The tricuspid valve is normal in structure. Tricuspid valve regurgitation is not demonstrated. No evidence of tricuspid stenosis. Aortic Valve: The aortic valve is normal in structure. Aortic valve regurgitation is not visualized. No aortic stenosis is present. Pulmonic Valve: The pulmonic valve was normal in structure. Pulmonic valve regurgitation is trivial. No evidence of pulmonic stenosis. Aorta: The aortic root is normal in size and structure. Venous: The inferior vena cava is normal in size with greater than 50% respiratory variability, suggesting right atrial pressure of 3 mmHg. IAS/Shunts: Evidence of atrial level shunting detected by color flow Doppler. A small patent foramen ovale is detected with bidirectional shunting across atrial septum.  TRICUSPID VALVE TR Peak grad:   24.2 mmHg TR Vmax:        246.00 cm/s Dani Gobble Croitoru MD Electronically signed by Sanda Klein MD Signature Date/Time: 08/02/2020/10:49:51 AM     Final    VAS [place] LOWER EXTREMITY VENOUS (DVT) (ONLY [facility] & [facility])  Result Date: 07/30/2020  Lower Venous DVT Study Indications: Edema, and Erythema. Other Indications: 174.6kg. Limitations: Body habitus, depth of vessels, tissue properties, clothing restriction, patient unable to tolerate compression maneuvers. Comparison Study: 10/29/2013- negative lower extremity venous duplex Performing Technologist: Maudry Mayhew RDMS, RVT, RDCS  Examination Guidelines: A complete evaluation includes B-mode imaging, spectral Doppler, color Doppler, and power Doppler as needed of all accessible portions of each vessel. Bilateral testing is considered an integral part of a complete examination. Limited examinations for reoccurring indications may be performed as noted. The reflux portion of the exam is performed with the patient in reverse Trendelenburg.  +-----+---------------+---------+-----------+-------------------+--------------+ RIGHTCompressibilityPhasicitySpontaneityProperties         Thrombus Aging +-----+---------------+---------+-----------+-------------------+--------------+ CFV                                     unable to visualize               +-----+---------------+---------+-----------+-------------------+--------------+   +---------+---------------+---------+-----------+---------------+-------------+ LEFT     CompressibilityPhasicitySpontaneityProperties     Thrombus                                                                 Aging         +---------+---------------+---------+-----------+---------------+-------------+ CFV                                         unable to                                                                visualize                    +---------+---------------+---------+-----------+---------------+-------------+ SFJ  unable to                                                                 visualize                    +---------+---------------+---------+-----------+---------------+-------------+ FV Prox                                     unable to                                                                visualize                    +---------+---------------+---------+-----------+---------------+-------------+ FV Mid                           Yes        patent                       +---------+---------------+---------+-----------+---------------+-------------+ FV Distal                        Yes        patent                       +---------+---------------+---------+-----------+---------------+-------------+ PFV                                         unable to                                                                visualize                    +---------+---------------+---------+-----------+---------------+-------------+ POP                     Yes      Yes        patent                       +---------+---------------+---------+-----------+---------------+-------------+ PTV                              Yes        patent                       +---------+---------------+---------+-----------+---------------+-------------+ PERO  Yes        patent                       +---------+---------------+---------+-----------+---------------+-------------+     Summary: LEFT: - There is no evidence of deep vein thrombosis in the lower extremity. However, portions of this examination were limited- see technologist comments above.  - No cystic structure found in the popliteal fossa.  *See table(s) above for measurements and observations. Electronically signed by Deitra Mayo MD on 07/30/2020 at 5:59:47 PM.    Final       Discharge Exam: Vitals:   08/08/20 2118 08/09/20 0517  BP: (!) 149/76 125/64  Pulse: 71 69  Resp: 19 18  Temp: 98.4 F (36.9 C) 98.2 F (36.8 C)  SpO2: 99% 97%    Vitals:   08/08/20 0909 08/08/20 1720 08/08/20 2118 08/09/20 0517  BP: (!) 138/56 (!) 117/36 (!) 149/76 125/64  Pulse: 79 72 71 69  Resp: 18 18 19 18   Temp: 98.7 F (37.1 C) 99.3 F (37.4 C) 98.4 F (36.9 C) 98.2 F (36.8 C)  TempSrc:    Oral  SpO2: 99% 97% 99% 97%  Weight:   (!) 174.7 kg   Height:        General: Pt is alert, awake, not in acute distress Cardiovascular: RRR, S1/S2 +, no rubs, no gallops Respiratory: CTA bilaterally, no wheezing, no rhonchi Abdominal: Soft, NT, ND, bowel sounds + Extremities: +1 pitting edema bilateral lower extremity, slightly more on the left lower extremity, no cyanosis    The results of significant diagnostics from this hospitalization (including imaging, microbiology, ancillary and laboratory) are listed below for reference.     Microbiology: Recent Results (from the past 240 hour(s))  Urine Culture     Status: None   Collection Time: 07/30/20 12:22 PM   Specimen: Urine, Random  Result Value Ref Range Status   Specimen Description URINE, RANDOM  Final   Special Requests NONE  Final   Culture   Final    NO GROWTH Performed at [facility], [address], [place], [place] [address]    Report Status 07/31/2020 FINAL  Final  Culture, Urine     Status: Abnormal   Collection Time: 08/06/20  4:31 PM   Specimen: Urine, Random  Result Value Ref Range Status   Specimen Description URINE, RANDOM  Final   Special Requests   Final    NONE Performed at [facility], [place] [address], [place], [place] [address]    Culture MULTIPLE SPECIES PRESENT, SUGGEST RECOLLECTION (A)  Final   Report Status 08/07/2020 FINAL  Final     Labs: BNP (last 3 results) Recent Labs    07/26/20 1346 07/29/20 0310  BNP 296.5* 354.6*   Basic Metabolic Panel: Recent Labs  Lab 08/05/20 0245 08/06/20 0410 08/07/20 0219 08/07/20 0821 08/08/20 0753 08/09/20 0248  NA 133* 130* 134*  --  135 131*  K 4.7 4.9 4.5  --  5.2* 4.9  CL 102 103  105  --  102 102  CO2 18* 19* 19*  --  22 20*  GLUCOSE 302* 228* 187*  --  233* 125*  BUN 46* 41* 47*  --  41* 34*  CREATININE 1.84* 1.71* 1.80*  --  1.62* 1.50*  CALCIUM 7.9* 7.6* 7.8*  --  8.3* 8.2*  MG  --   --   --  2.2  --   --    Liver  Function Tests: No results for input(s): AST, ALT, ALKPHOS, BILITOT, PROT, ALBUMIN in the last 168 hours. No results for input(s): LIPASE, AMYLASE in the last 168 hours. No results for input(s): AMMONIA in the last 168 hours. CBC: Recent Labs  Lab 08/05/20 0245 08/06/20 0410 08/07/20 0219 08/08/20 0753 08/09/20 0248  WBC 18.3* 27.6* 13.5* 10.3 9.8  NEUTROABS  --   --   --  8.1* 7.4  HGB 9.6* 11.8* 9.2* 9.2* 8.8*  HCT 30.5* 37.0* 28.6* 27.9* 26.5*  MCV 91.9 92.5 92.3 92.4 91.4  PLT 392 338 286 290 265   Cardiac Enzymes: No results for input(s): CKTOTAL, CKMB, CKMBINDEX, TROPONINI in the last 168 hours. BNP: Invalid input(s): POCBNP CBG: Recent Labs  Lab 08/08/20 0647 08/08/20 1118 08/08/20 1627 08/08/20 2118 08/09/20 0648  GLUCAP 164* 217* 159* 134* 125*   D-Dimer No results for input(s): DDIMER in the last 72 hours. Hgb A1c No results for input(s): HGBA1C in the last 72 hours. Lipid Profile No results for input(s): CHOL, HDL, LDLCALC, TRIG, CHOLHDL, LDLDIRECT in the last 72 hours. Thyroid function studies No results for input(s): TSH, T4TOTAL, T3FREE, THYROIDAB in the last 72 hours.  Invalid input(s): FREET3 Anemia work up No results for input(s): VITAMINB12, FOLATE, FERRITIN, TIBC, IRON, RETICCTPCT in the last 72 hours. Urinalysis    Component Value Date/Time   COLORURINE AMBER (A) 08/06/2020 1725   APPEARANCEUR CLOUDY (A) 08/06/2020 1725   LABSPEC 1.018 08/06/2020 1725   PHURINE 5.0 08/06/2020 1725   GLUCOSEU 50 (A) 08/06/2020 1725   GLUCOSEU NEGATIVE 12/18/2019 1139   HGBUR MODERATE (A) 08/06/2020 1725   BILIRUBINUR NEGATIVE 08/06/2020 1725   KETONESUR NEGATIVE 08/06/2020 1725   PROTEINUR 100 (A) 08/06/2020 1725    UROBILINOGEN 0.2 12/18/2019 1139   NITRITE NEGATIVE 08/06/2020 1725   LEUKOCYTESUR NEGATIVE 08/06/2020 1725   Sepsis Labs Invalid input(s): PROCALCITONIN,  WBC,  LACTICIDVEN Microbiology Recent Results (from the past 240 hour(s))  Urine Culture     Status: None   Collection Time: 07/30/20 12:22 PM   Specimen: Urine, Random  Result Value Ref Range Status   Specimen Description URINE, RANDOM  Final   Special Requests NONE  Final   Culture   Final    NO GROWTH Performed at [facility], [place] [address], [place], [place] [address]    Report Status 07/31/2020 FINAL  Final  Culture, Urine     Status: Abnormal   Collection Time: 08/06/20  4:31 PM   Specimen: Urine, Random  Result Value Ref Range Status   Specimen Description URINE, RANDOM  Final   Special Requests   Final    NONE Performed at [facility], [place] [address], [place],  [address]    Culture MULTIPLE SPECIES PRESENT, SUGGEST RECOLLECTION (A)  Final   Report Status 08/07/2020 FINAL  Final     Time coordinating discharge: Over 30 minutes  SIGNED:   Darliss Cheney, MD  [facility] 08/09/2020, 9:45 AM  If 7PM-7AM, please contact night-coverage [email or web address]

## 2020-08-09 NOTE — Care Management Important Message (Signed)
Important Message  Patient Details  Name: Gerald Day MRN: [id number] Date of Birth: 18-Apr-1956   Medicare Important Message Given:  Yes     Read Bonelli P Ardena Gangl 08/09/2020, 3:02 PM

## 2020-08-09 NOTE — TOC Benefit Eligibility Note (Signed)
Transition of Care [facility]) Benefit Eligibility Note    Patient Details  Name: Gerald Day MRN: [id number] Date of Birth: 1956/02/04   Medication/Dose: Eliquis  Covered?: Yes  Prescription Coverage Preferred Pharmacy: patient can use any pharmacy  Spoke with Person/Company/Phone Number:: [employer] [phone]  Co-Pay: $47 for 30 day retail/ $135 for 90 day mail order  Prior Approval: No    Delorse Lek Phone Number: 08/09/2020, 9:12 AM

## 2020-08-09 NOTE — TOC Transition Note (Addendum)
Transition of Care [facility]) - CM/SW Discharge Note *Discharged to [facility] *Room A1 - bed 1 Number for Report: [phone]   Patient Details  Name: Gerald Day MRN: [id number] Date of Birth: [birth year]  Transition of Care [facility]) CM/SW Contact:  Sable Feil, LCSW Phone Number: 08/09/2020, 3:44 PM   Clinical Narrative:  On 12/2 CSW advised by Jackelyn Poling with [facility] ([phone]) that insurance auth received from [facility]. Spoke with Jackelyn Poling [phone]) today (12/3) and was advised that patient can discharge to them today.   12/3: Patient discharging today to [facility]. Discharge clinicals transmitted to facility and admissions director Debbie provided information regarding patient's CPAP settings and was advised that they do have a CPAP machine. Visited with patient and his son Gerald Day at the bedside and today's discharge discussed. Patient and son advised that the facility will need his vaccination card.         Final next level of care: Skilled Nursing Facility Barriers to Discharge: Barriers Resolved   Patient Goals and CMS Choice Patient states their goals for this hospitalization and ongoing recovery are:: Patient and family in agreement with ST rehab. CMS Medicare.gov Compare Post Acute Care list provided to:: Patient Choice offered to / list presented to : Patient  Discharge Placement   Existing PASRR number confirmed : 08/01/20          Patient chooses bed at:  [facility] in [place], [place]) Patient to be transferred to facility by: Non-emergency ambulance transport Name of family member notified: Talked with son Gerald Day at the bedside Patient and family notified of of transfer: 08/09/20  Discharge Plan and Services     Post Acute Care Choice: [place]                               Social Determinants of Health (SDOH) Interventions  No SDOH interventions requested or needed at  discharge.   Readmission Risk Interventions No flowsheet data found.

## 2020-08-09 NOTE — Progress Notes (Signed)
DISCHARGE NOTE [facility] to be discharged [place].  per MD order. Patient verbalized understanding.  Skin clean, dry and intact without evidence of skin break down, no evidence of skin tears noted. IV catheter discontinued intact. Site without signs and symptoms of complications. Dressing and pressure applied. Pt denies pain at the site currently. No complaints noted.  Patient free of lines, drains, and wounds.   Discharge packet assembled. An After Visit Summary (AVS) was printed and given to the EMS personnel. Patient escorted via stretcher and discharged to [facility] via ambulance. Report called to accepting facility; all questions and concerns addressed.   Viviano Simas, RN

## 2020-08-12 DIAGNOSIS — I10 Essential (primary) hypertension: Secondary | ICD-10-CM | POA: Diagnosis not present

## 2020-08-12 DIAGNOSIS — E119 Type 2 diabetes mellitus without complications: Secondary | ICD-10-CM | POA: Diagnosis not present

## 2020-08-12 DIAGNOSIS — M109 Gout, unspecified: Secondary | ICD-10-CM | POA: Diagnosis not present

## 2020-08-12 DIAGNOSIS — L03116 Cellulitis of left lower limb: Secondary | ICD-10-CM | POA: Diagnosis not present

## 2020-08-12 DIAGNOSIS — G4733 Obstructive sleep apnea (adult) (pediatric): Secondary | ICD-10-CM | POA: Diagnosis not present

## 2020-08-12 DIAGNOSIS — T560X1D Toxic effect of lead and its compounds, accidental (unintentional), subsequent encounter: Secondary | ICD-10-CM | POA: Diagnosis not present

## 2020-08-12 DIAGNOSIS — N179 Acute kidney failure, unspecified: Secondary | ICD-10-CM | POA: Diagnosis not present

## 2020-08-13 DIAGNOSIS — L03116 Cellulitis of left lower limb: Secondary | ICD-10-CM | POA: Diagnosis not present

## 2020-08-15 DIAGNOSIS — E785 Hyperlipidemia, unspecified: Secondary | ICD-10-CM | POA: Diagnosis not present

## 2020-08-15 DIAGNOSIS — I5032 Chronic diastolic (congestive) heart failure: Secondary | ICD-10-CM | POA: Diagnosis not present

## 2020-08-20 ENCOUNTER — Telehealth: Payer: Self-pay | Admitting: Internal Medicine

## 2020-08-20 NOTE — Telephone Encounter (Signed)
Patients son called and had a few questions. He was wondering if he could get a call back. Phone [phone]

## 2020-08-22 NOTE — Telephone Encounter (Signed)
LVM for pts son to please call the office back with his questions and concerns.

## 2020-08-23 NOTE — Telephone Encounter (Signed)
Sent to Dr. John. 

## 2020-08-23 NOTE — Telephone Encounter (Signed)
Patients son calling back stating he is wondering what the patients plans were when he gets out of rehab, or if that was something the rehab center does. Also how soon after rehab should he be seen by Dr. Jenny Reichmann

## 2020-08-23 NOTE — Telephone Encounter (Signed)
Yes, I think there should be a care plan for after rehab, but we should see him within 1-2 wks post rehab as well, thanks

## 2020-08-27 NOTE — Telephone Encounter (Signed)
LVM for pts son of Dr. Gwynn Burly instructions and for pts son to please call the clinic to set-up an appointment for pt to come into the clinic to see Dr. Jenny Reichmann.

## 2020-08-28 ENCOUNTER — Encounter: Payer: Self-pay | Admitting: Cardiology

## 2020-08-28 ENCOUNTER — Ambulatory Visit (INDEPENDENT_AMBULATORY_CARE_PROVIDER_SITE_OTHER): Payer: Medicare HMO | Admitting: Cardiology

## 2020-08-28 ENCOUNTER — Other Ambulatory Visit: Payer: Self-pay

## 2020-08-28 VITALS — BP 134/64 | HR 76 | Ht 72.0 in | Wt 384.0 lb

## 2020-08-28 DIAGNOSIS — E1169 Type 2 diabetes mellitus with other specified complication: Secondary | ICD-10-CM | POA: Diagnosis not present

## 2020-08-28 DIAGNOSIS — G4733 Obstructive sleep apnea (adult) (pediatric): Secondary | ICD-10-CM | POA: Diagnosis not present

## 2020-08-28 DIAGNOSIS — Z7901 Long term (current) use of anticoagulants: Secondary | ICD-10-CM

## 2020-08-28 DIAGNOSIS — I480 Paroxysmal atrial fibrillation: Secondary | ICD-10-CM | POA: Diagnosis not present

## 2020-08-28 DIAGNOSIS — I484 Atypical atrial flutter: Secondary | ICD-10-CM

## 2020-08-28 DIAGNOSIS — R600 Localized edema: Secondary | ICD-10-CM | POA: Diagnosis not present

## 2020-08-28 DIAGNOSIS — E669 Obesity, unspecified: Secondary | ICD-10-CM

## 2020-08-28 DIAGNOSIS — E11622 Type 2 diabetes mellitus with other skin ulcer: Secondary | ICD-10-CM | POA: Diagnosis not present

## 2020-08-28 DIAGNOSIS — I10 Essential (primary) hypertension: Secondary | ICD-10-CM

## 2020-08-28 DIAGNOSIS — Z79899 Other long term (current) drug therapy: Secondary | ICD-10-CM | POA: Diagnosis not present

## 2020-08-28 DIAGNOSIS — M6281 Muscle weakness (generalized): Secondary | ICD-10-CM | POA: Diagnosis not present

## 2020-08-28 DIAGNOSIS — N1831 Chronic kidney disease, stage 3a: Secondary | ICD-10-CM

## 2020-08-28 DIAGNOSIS — L89893 Pressure ulcer of other site, stage 3: Secondary | ICD-10-CM | POA: Diagnosis not present

## 2020-08-28 LAB — CBC
Hematocrit: 26.1 % — ABNORMAL LOW (ref 37.5–51.0)
Hemoglobin: 8.3 g/dL — ABNORMAL LOW (ref 13.0–17.7)
MCH: 28.6 pg (ref 26.6–33.0)
MCHC: 31.8 g/dL (ref 31.5–35.7)
MCV: 90 fL (ref 79–97)
Platelets: 296 10*3/uL (ref 150–450)
RBC: 2.9 x10E6/uL — ABNORMAL LOW (ref 4.14–5.80)
RDW: 13.2 % (ref 11.6–15.4)
WBC: 7.6 10*3/uL (ref 3.4–10.8)

## 2020-08-28 LAB — BASIC METABOLIC PANEL
BUN/Creatinine Ratio: 21 (ref 10–24)
BUN: 29 mg/dL — ABNORMAL HIGH (ref 8–27)
CO2: 26 mmol/L (ref 20–29)
Calcium: 8.8 mg/dL (ref 8.6–10.2)
Chloride: 99 mmol/L (ref 96–106)
Creatinine, Ser: 1.35 mg/dL — ABNORMAL HIGH (ref 0.76–1.27)
GFR calc Af Amer: 63 mL/min/{1.73_m2} (ref >59)
GFR calc non Af Amer: 55 mL/min/{1.73_m2} — ABNORMAL LOW (ref >59)
Glucose: 205 mg/dL — ABNORMAL HIGH (ref 65–99)
Potassium: 5.5 mmol/L — ABNORMAL HIGH (ref 3.5–5.2)
Sodium: 135 mmol/L (ref 134–144)

## 2020-08-28 NOTE — Patient Instructions (Signed)
Medication Instructions:  STOP- Indomethacin(Indocin) TAKE- Furosemide(lasix) 80 mg by mouth daily  *If you need a refill on your cardiac medications before your next appointment, please call your pharmacy*   Lab Work: CBC and BMP Today in office  If you have labs (blood work) drawn today and your tests are completely normal, you will receive your results only by: Marland Kitchen MyChart Message (if you have MyChart) OR . A paper copy in the mail If you have any lab test that is abnormal or we need to change your treatment, we will call you to review the results.   Testing/Procedures: None Ordered   Follow-Up: At [facility], you and your health needs are our priority.  As part of our continuing mission to provide you with exceptional heart care, we have created designated Provider Care Teams.  These Care Teams include your primary Cardiologist (physician) and Advanced Practice Providers (APPs -  Physician Assistants and Nurse Practitioners) who all work together to provide you with the care you need, when you need it.  We recommend signing up for the patient portal called "MyChart".  Sign up information is provided on this After Visit Summary.  MyChart is used to connect with patients for Virtual Visits (Telemedicine).  Patients are able to view lab/test results, encounter notes, upcoming appointments, etc.  Non-urgent messages can be sent to your provider as well.   To learn more about what you can do with MyChart, go to [email or web address].    Your next appointment:   6 week(s)  The format for your next appointment:   In Person  Provider:   You will see one of the following Advanced Practice Providers on your designated Care Team:    Kerin Ransom, [place]

## 2020-08-28 NOTE — Assessment & Plan Note (Signed)
BMI 52 

## 2020-08-28 NOTE — Assessment & Plan Note (Signed)
On Eliquis- CHADS VASC=3 

## 2020-08-28 NOTE — Assessment & Plan Note (Signed)
Reports compliance with C-pap 

## 2020-08-28 NOTE — Assessment & Plan Note (Signed)
On oral agents 

## 2020-08-28 NOTE — Progress Notes (Signed)
Cardiology Office Note:    Date:  08/28/2020   ID:  Gerald Day, DOB [birth year], MRN [id number]  PCP:  Biagio Borg, MD  Cardiologist:  Minus Breeding, MD  Electrophysiologist:  None   Referring MD: Biagio Borg, MD   Chief Complaint  Patient presents with  . Follow-up    [facility]  History of Present Illness:    Gerald Day is a 64 y.o. male with a hx of DM, HTN, HLD, OA, OSA on CPAP, morbid obesity, and chronic LE edema who was seen by cardiology for the first time 07/30/2020 for AF in the setting of LE cellulitis and suspected sepsis.   Patient was in his usual state of health when he decided to join [employer] sne[employer] at the [facility].  He started doing swimming and other exercises.  He was unaware of a wound on his left great toe that had developed.  Initially he he was doing quite well but then he noticed increasing shortness of breath with exertion that caused him to stop.  His family noted that his toe was edematous and red.  He presented to an urgent care and was transferred to [facility].  On arrival to the ED 07/26/2020 his initial EKG showed "sinus tachycardia" but I suspect this was atrial flutter.  EKG on 07/29/2020 showed atrial fibrillation and cardiology was consulted. He was admitted and treated for cellulitis and suspected sepsis though his blood cultures were negative.  Venous Dopplers were negative for DVT.  Echocardiogram showed ejection fraction of 55 to 60% with mild LVH and mild left atrial dilatation.  He did have aortic root dilatation of 4.1 cm.  On 08/02/2020 the patient underwent TEE cardioversion to sinus rhythm.  He maintained normal sinus rhythm and was discharged.  He was discharged to [facility] inpatient rehab.  He is seen in the office today for follow-up.  He says he feels well from a cardiac standpoint, no palpitations, no unusual dyspnea.  He does have significant LE edema.  In reviewing his medications it appears he has Lasix 80  mg PRN ordered.  He did have AKI during his recent hospitalization.  His SCr was down to 1.5 at discharge.  He also has Indocin ordered PRN for gout and I will DC this.   Past Medical History:  Diagnosis Date  . Arthritis    IN KNEES  . Cellulitis 10/26/2013   LOWER RT EXTREMITY  . CELLULITIS/ABSCESS, LEG 06/27/2007  . COLONIC POLYPS   . CONGESTIVE HEART FAILURE   . DIABETES MELLITUS, TYPE II   . DIVERTICULOSIS, COLON   . ERECTILE DYSFUNCTION, ORGANIC   . GOUT NOS   . Headache   . HYPERLIPIDEMIA   . HYPERTENSION   . LOW BACK PAIN   . Morbid obesity ([place])   . SLEEP APNEA, OBSTRUCTIVE    USES CPAP    Past Surgical History:  Procedure Laterality Date  . APPENDECTOMY    . CARDIOVERSION N/A 08/02/2020   Procedure: CARDIOVERSION;  Surgeon: Sanda Klein, MD;  [facility];  Service: Cardiovascular;  Laterality: N/A;  . FOOT SURGERY     LT  . TEE WITHOUT CARDIOVERSION N/A 08/02/2020   Procedure: TRANSESOPHAGEAL ECHOCARDIOGRAM (TEE);  Surgeon: Sanda Klein, MD;  Location: [place];  Service: Cardiovascular;  Laterality: N/A;  . TENOTOMY / FLEXOR TENDON TRANSFER Right 03/15/2015   Procedure: TENOTOMY HT REPAIR/ULCER DEBRIDEMENT/GRAFT PREP/ACELL GRAFT APPLICATION;  Surgeon: Jana Half, DPM;  Location: [place];  Service: Podiatry;  Laterality: Right;  HALLUX    Current Medications: Current Meds  Medication Sig  . apixaban (ELIQUIS) 5 MG TABS tablet Take 1 tablet (5 mg total) by mouth 2 (two) times daily.  Marland Kitchen atorvastatin (LIPITOR) 10 MG tablet TAKE 1 TABLET (10 MG TOTAL) BY MOUTH DAILY.  . carvedilol (COREG) 12.5 MG tablet Take 1 tablet (12.5 mg total) by mouth 2 (two) times daily with a meal.  . Cholecalciferol (THERA-D 2000) 50 MCG (2000 UT) TABS 1 tab by mouth once daily  . cloNIDine (CATAPRES) 0.2 MG tablet Take 1 tablet (0.2 mg total) by mouth 2 (two) times daily.  Marland Kitchen diltiazem (CARDIZEM CD) 180 MG 24 hr capsule Take 1 capsule (180 mg total) by mouth daily.  Marland Kitchen  glipiZIDE (GLUCOTROL XL) 5 MG 24 hr tablet TAKE 1 TABLET BY MOUTH EVERY DAY WITH BREAKFAST  . metFORMIN (GLUCOPHAGE) 500 MG tablet Take 2 tablets (1,000 mg total) by mouth 2 (two) times daily with a meal.  . oxyCODONE (OXY IR/ROXICODONE) 5 MG immediate release tablet Take 1 tablet (5 mg total) by mouth every 6 (six) hours as needed for moderate pain.  . potassium chloride SA (KLOR-CON M20) 20 MEQ tablet Take 1 tablet (20 mEq total) by mouth daily.  . vitamin B-12 (CYANOCOBALAMIN) 1000 MCG tablet Take 1 tablet (1,000 mcg total) by mouth daily.  . [DISCONTINUED] furosemide (LASIX) 80 MG tablet Take 1 tablet (80 mg total) by mouth daily as needed. (Patient taking differently: Take 80 mg by mouth daily.)  . [DISCONTINUED] indomethacin (INDOCIN) 50 MG capsule TAKE 1 CAPSULE (50 MG TOTAL) BY MOUTH 3 (THREE) TIMES DAILY AS NEEDED. (Patient taking differently: Take 50 mg by mouth 3 (three) times daily as needed (gout).)     Allergies:   Penicillins and Tizanidine   Social History   Socioeconomic History  . Marital status: Married    Spouse name: Not on file  . Number of children: Not on file  . Years of education: Not on file  . Highest education level: Not on file  Occupational History  . Occupation: retired  Tobacco Use  . Smoking status: Never Smoker  . Smokeless tobacco: Never Used  Vaping Use  . Vaping Use: Never used  Substance and Sexual Activity  . Alcohol use: Not Currently    Alcohol/week: 3.0 - 4.0 standard drinks    Types: 3 - 4 Cans of beer per week    Comment: hardly any; h/o heavy use  . Drug use: No  . Sexual activity: Not on file  Other Topics Concern  . Not on file  Social History Narrative  . Not on file   Social Determinants of Health   Financial Resource Strain: Not on file  Food Insecurity: Not on file  Transportation Needs: Not on file  Physical Activity: Not on file  Stress: Not on file  Social Connections: Not on file     Family History: The patient's  family history includes Asthma in an other family member; Cardiomyopathy in his father.  ROS:   Please see the history of present illness.     All other systems reviewed and are negative.  EKGs/Labs/Other Studies Reviewed:    The following studies were reviewed today: TEE 2020/08/03- IMPRESSIONS    1. Left ventricular ejection fraction, by estimation, is 50 to 55%. The  left ventricle has low normal function. The left ventricle has no regional  wall motion abnormalities.  2. Right ventricular systolic function is normal. The  right ventricular  size is normal. There is normal pulmonary artery systolic pressure.  3. No left atrial/left atrial appendage thrombus was detected. The LAA  emptying velocity was 60 cm/s.  4. The mitral valve is normal in structure. Trivial mitral valve  regurgitation. No evidence of mitral stenosis.  5. The aortic valve is normal in structure. Aortic valve regurgitation is  not visualized. No aortic stenosis is present.  6. The inferior vena cava is normal in size with greater than 50%  respiratory variability, suggesting right atrial pressure of 3 mmHg.  7. Evidence of atrial level shunting detected by color flow Doppler.  There is a very small patent foramen ovale with a tiny amount of  bidirectional shunting across atrial septum.   EKG:  EKG is not ordered today.  The ekg ordered 08/02/2020- demonstrates NSR, HR 62  Recent Labs: 12/18/2019: TSH 2.23 07/26/2020: ALT 22 07/29/2020: B Natriuretic Peptide 356.8 08/07/2020: Magnesium 2.2 08/09/2020: BUN 34; Creatinine, Ser 1.50; Hemoglobin 8.8; Platelets 265; Potassium 4.9; Sodium 131  Recent Lipid Panel    Component Value Date/Time   CHOL 170 06/27/2020 0745   TRIG 166.0 (H) 06/27/2020 0745   HDL 43.90 06/27/2020 0745   CHOLHDL 4 06/27/2020 0745   VLDL 33.2 06/27/2020 0745   LDLCALC 93 06/27/2020 0745   LDLDIRECT 83.0 12/19/2018 0728    Physical Exam:    VS:  BP 134/64 (BP Location: Left  Arm, Patient Position: Sitting, Cuff Size: Normal)   Pulse 76   Ht 6' (1.829 m)   Wt (!) 384 lb (174.2 kg)   BMI 52.08 kg/m     Wt Readings from Last 3 Encounters:  08/28/20 (!) 384 lb (174.2 kg)  08/08/20 (!) 385 lb 2.4 oz (174.7 kg)  07/26/20 (!) 385 lb (174.6 kg)     GEN: Obese male in wheelchair, well developed in no acute distress HEENT: Normal NECK: No JVD; No carotid bruits CARDIAC: RRR, no murmurs, rubs, gallops RESPIRATORY:  Clear to auscultation without rales, wheezing or rhonchi  ABDOMEN: obese, soft, non-distended MUSCULOSKELETAL:  2-3+ bilateral LE edema; No deformity  SKIN: Warm and dry NEUROLOGIC:  Alert and oriented x 3 PSYCHIATRIC:  Normal affect   ASSESSMENT:    PAF (paroxysmal atrial fibrillation) (HCC) PAF Nov 2021 in setting of LE cellulitis- s/p TEE CV 08/02/2020  Anticoagulated On Eliquis- CHADS VASC=3  Diabetes mellitus type 2 in obese (HCC) On oral agents  OSA (obstructive sleep apnea) Reports compliance with C-pap  Morbid obesity (HCC) BMI 52  Essential hypertension Controlled on current medications- Echo showed preserved LVF, mild LVH  Edema leg Chronic LE edema- resume daily diuretic, check BMP  Stage 3a chronic kidney disease ([place]) I reviewed past labs.  His SCr was near his baseline at discharge.   PLAN:    Lasix 80 mg daily.  DC Indocin.  Check BMP and CBC.  F/U in clinic in 4-6 weeks.    Medication Adjustments/Labs and Tests Ordered: Current medicines are reviewed at length with the patient today.  Concerns regarding medicines are outlined above.  Orders Placed This Encounter  Procedures  . CBC  . Basic Metabolic Panel (BMET)   No orders of the defined types were placed in this encounter.   Patient Instructions  Medication Instructions:  STOP- Indomethacin(Indocin) TAKE- Furosemide(lasix) 80 mg by mouth daily  *If you need a refill on your cardiac medications before your next appointment, please call your  pharmacy*   Lab Work: CBC and BMP Today in office  If you have labs (blood work) drawn today and your tests are completely normal, you will receive your results only by: Marland Kitchen MyChart Message (if you have MyChart) OR . A paper copy in the mail If you have any lab test that is abnormal or we need to change your treatment, we will call you to review the results.   Testing/Procedures: None Ordered   Follow-Up: At [facility], you and your health needs are our priority.  As part of our continuing mission to provide you with exceptional heart care, we have created designated Provider Care Teams.  These Care Teams include your primary Cardiologist (physician) and Advanced Practice Providers (APPs -  Physician Assistants and Nurse Practitioners) who all work together to provide you with the care you need, when you need it.  We recommend signing up for the patient portal called "MyChart".  Sign up information is provided on this After Visit Summary.  MyChart is used to connect with patients for Virtual Visits (Telemedicine).  Patients are able to view lab/test results, encounter notes, upcoming appointments, etc.  Non-urgent messages can be sent to your provider as well.   To learn more about what you can do with MyChart, go to [email or web address].    Your next appointment:   6 week(s)  The format for your next appointment:   In Person  Provider:   You will see one of the following Advanced Practice Providers on your designated Care Team:    Kerin Ransom, PA-C           Signed, Kerin Ransom, [place]  08/28/2020 10:27 AM    [place]

## 2020-08-28 NOTE — Assessment & Plan Note (Signed)
I reviewed past labs.  His SCr was near his baseline at discharge.

## 2020-08-28 NOTE — Assessment & Plan Note (Signed)
Chronic LE edema- resume daily diuretic, check BMP

## 2020-08-28 NOTE — Assessment & Plan Note (Signed)
Controlled on current medications- Echo showed preserved LVF, mild LVH

## 2020-08-28 NOTE — Assessment & Plan Note (Signed)
PAF Nov 2021 in setting of LE cellulitis- s/p TEE CV 08/02/2020

## 2020-09-01 ENCOUNTER — Encounter (HOSPITAL_COMMUNITY): Payer: Self-pay

## 2020-09-01 ENCOUNTER — Emergency Department (HOSPITAL_COMMUNITY)
Admission: EM | Admit: 2020-09-01 | Discharge: 2020-09-01 | Disposition: A | Discharge status: Home / Self Care | Payer: Medicare HMO | Attending: Emergency Medicine | Admitting: Emergency Medicine

## 2020-09-01 ENCOUNTER — Other Ambulatory Visit: Payer: Self-pay

## 2020-09-01 DIAGNOSIS — I509 Heart failure, unspecified: Secondary | ICD-10-CM | POA: Insufficient documentation

## 2020-09-01 DIAGNOSIS — R42 Dizziness and giddiness: Secondary | ICD-10-CM | POA: Diagnosis not present

## 2020-09-01 DIAGNOSIS — I10 Essential (primary) hypertension: Secondary | ICD-10-CM | POA: Diagnosis not present

## 2020-09-01 DIAGNOSIS — R0689 Other abnormalities of breathing: Secondary | ICD-10-CM | POA: Diagnosis not present

## 2020-09-01 DIAGNOSIS — R279 Unspecified lack of coordination: Secondary | ICD-10-CM | POA: Diagnosis not present

## 2020-09-01 DIAGNOSIS — R29898 Other symptoms and signs involving the musculoskeletal system: Secondary | ICD-10-CM | POA: Diagnosis not present

## 2020-09-01 DIAGNOSIS — I110 Hypertensive heart disease with heart failure: Secondary | ICD-10-CM | POA: Insufficient documentation

## 2020-09-01 DIAGNOSIS — I951 Orthostatic hypotension: Secondary | ICD-10-CM | POA: Diagnosis not present

## 2020-09-01 DIAGNOSIS — E1169 Type 2 diabetes mellitus with other specified complication: Secondary | ICD-10-CM | POA: Diagnosis not present

## 2020-09-01 DIAGNOSIS — E785 Hyperlipidemia, unspecified: Secondary | ICD-10-CM | POA: Insufficient documentation

## 2020-09-01 DIAGNOSIS — R110 Nausea: Secondary | ICD-10-CM | POA: Insufficient documentation

## 2020-09-01 DIAGNOSIS — R55 Syncope and collapse: Secondary | ICD-10-CM | POA: Diagnosis not present

## 2020-09-01 DIAGNOSIS — Z743 Need for continuous supervision: Secondary | ICD-10-CM | POA: Diagnosis not present

## 2020-09-01 LAB — BASIC METABOLIC PANEL
Anion gap: 10 (ref 5–15)
BUN: 25 mg/dL — ABNORMAL HIGH (ref 8–23)
CO2: 25 mmol/L (ref 22–32)
Calcium: 8.7 mg/dL — ABNORMAL LOW (ref 8.9–10.3)
Chloride: 96 mmol/L — ABNORMAL LOW (ref 98–111)
Creatinine, Ser: 1.54 mg/dL — ABNORMAL HIGH (ref 0.61–1.24)
GFR, Estimated: 50 mL/min — ABNORMAL LOW (ref >60)
Glucose, Bld: 232 mg/dL — ABNORMAL HIGH (ref 70–99)
Potassium: 4.7 mmol/L (ref 3.5–5.1)
Sodium: 131 mmol/L — ABNORMAL LOW (ref 135–145)

## 2020-09-01 LAB — CBC
HCT: 28.7 % — ABNORMAL LOW (ref 39.0–52.0)
Hemoglobin: 8.9 g/dL — ABNORMAL LOW (ref 13.0–17.0)
MCH: 28.8 pg (ref 26.0–34.0)
MCHC: 31 g/dL (ref 30.0–36.0)
MCV: 92.9 fL (ref 80.0–100.0)
Platelets: 321 10*3/uL (ref 150–400)
RBC: 3.09 MIL/uL — ABNORMAL LOW (ref 4.22–5.81)
RDW: 13.5 % (ref 11.5–15.5)
WBC: 12.3 10*3/uL — ABNORMAL HIGH (ref 4.0–10.5)
nRBC: 0 % (ref 0.0–0.2)

## 2020-09-01 LAB — CBG MONITORING, ED: Glucose-Capillary: 206 mg/dL — ABNORMAL HIGH (ref 70–99)

## 2020-09-01 LAB — TROPONIN I (HIGH SENSITIVITY)
Troponin I (High Sensitivity): 18 ng/L — ABNORMAL HIGH (ref <18)
Troponin I (High Sensitivity): 17 ng/L (ref <18)

## 2020-09-01 NOTE — ED Triage Notes (Signed)
Pt from [place]. Per pt, was playing a board game with sister and niece and suddenly became dizzy and nauseous before "eyes rolled back in head and he lost consciousness" around 1400. Pt unconscious for 1 minute. Pt did vomit after passing out as well as with EMS. Pt denies N/V at this time, does complain of mild lightheadness

## 2020-09-01 NOTE — ED Provider Notes (Signed)
[facility] Emergency Department Provider Note MRN:  [id number]  Arrival date & time: 09/01/20     Chief Complaint   Loss of Consciousness   History of Present Illness   Gerald Day is a 64 y.o. year-old male with a history of diabetes, A. fib presenting to the ED with chief complaint of loss of consciousness.  Patient explains that he did not get up out of bed all day yesterday because physical therapy did not come to work with him.  He was visited by family today and they got him out of bed to play board game.  Shortly after getting up he began feeling lightheaded, nauseated, had a syncopal episode.  Denies falling or any traumatic injuries.  Denies any chest pain or shortness of breath before or after the syncopal episode.  Currently feels back to normal.  Admits to not drinking very much water recently.  Review of Systems  A complete 10 system review of systems was obtained and all systems are negative except as noted in the HPI and PMH.   Patient's Health History    Past Medical History:  Diagnosis Date  . Arthritis    IN KNEES  . Cellulitis 10/26/2013   LOWER RT EXTREMITY  . CELLULITIS/ABSCESS, LEG 06/27/2007  . COLONIC POLYPS   . CONGESTIVE HEART FAILURE   . DIABETES MELLITUS, TYPE II   . DIVERTICULOSIS, COLON   . ERECTILE DYSFUNCTION, ORGANIC   . GOUT NOS   . Headache   . HYPERLIPIDEMIA   . HYPERTENSION   . LOW BACK PAIN   . Morbid obesity ([place])   . SLEEP APNEA, OBSTRUCTIVE    USES CPAP    Past Surgical History:  Procedure Laterality Date  . APPENDECTOMY    . CARDIOVERSION N/A 08/02/2020   Procedure: CARDIOVERSION;  Surgeon: Sanda Klein, MD;  [facility];  Service: Cardiovascular;  Laterality: N/A;  . FOOT SURGERY     LT  . TEE WITHOUT CARDIOVERSION N/A 08/02/2020   Procedure: TRANSESOPHAGEAL ECHOCARDIOGRAM (TEE);  Surgeon: Sanda Klein, MD;  Location: [place];  Service: Cardiovascular;  Laterality: N/A;  .  TENOTOMY / FLEXOR TENDON TRANSFER Right 03/15/2015   Procedure: TENOTOMY HT REPAIR/ULCER DEBRIDEMENT/GRAFT PREP/ACELL GRAFT APPLICATION;  Surgeon: Jana Half, DPM;  Location: [place];  Service: Podiatry;  Laterality: Right;  HALLUX    Family History  Problem Relation Age of Onset  . Cardiomyopathy Father   . Asthma Other     Social History   Socioeconomic History  . Marital status: Married    Spouse name: Not on file  . Number of children: Not on file  . Years of education: Not on file  . Highest education level: Not on file  Occupational History  . Occupation: retired  Tobacco Use  . Smoking status: Never Smoker  . Smokeless tobacco: Never Used  Vaping Use  . Vaping Use: Never used  Substance and Sexual Activity  . Alcohol use: Not Currently    Alcohol/week: 3.0 - 4.0 standard drinks    Types: 3 - 4 Cans of beer per week    Comment: hardly any; h/o heavy use  . Drug use: No  . Sexual activity: Not on file  Other Topics Concern  . Not on file  Social History Narrative  . Not on file   Social Determinants of Health   Financial Resource Strain: Not on file  Food Insecurity: Not on file  Transportation Needs: Not on file  Physical Activity:  Not on file  Stress: Not on file  Social Connections: Not on file  Intimate Partner Violence: Not on file     Physical Exam   Vitals:   09/01/20 2000 09/01/20 2030  BP: 107/83 (!) 150/64  Pulse: 71 70  Resp: 19 20  Temp:    SpO2: 97% 97%    CONSTITUTIONAL: Chronically ill-appearing, NAD NEURO:  Alert and oriented x 3, no focal deficits EYES:  eyes equal and reactive ENT/NECK:  no LAD, no JVD CARDIO: Regular rate, well-perfused, normal S1 and S2 PULM:  CTAB no wheezing or rhonchi GI/GU:  normal bowel sounds, non-distended, non-tender MSK/SPINE:  No gross deformities, swollen bilateral lower extremities, mild erythema to the left leg SKIN:  no rash, atraumatic PSYCH:  Appropriate speech and behavior  *Additional  and/or pertinent findings included in MDM below  Diagnostic and Interventional Summary    EKG Interpretation  Date/Time:  Sunday September 01 2020 16:15:49 EST Ventricular Rate:  68 PR Interval:    QRS Duration: 106 QT Interval:  420 QTC Calculation: 447 R Axis:   72 Text Interpretation: Sinus rhythm  Confirmed by Gerlene Fee [phone]) on 09/01/2020 5:11:25 PM      Labs Reviewed  BASIC METABOLIC PANEL - Abnormal; Notable for the following components:      Result Value   Sodium 131 (*)    Chloride 96 (*)    Glucose, Bld 232 (*)    BUN 25 (*)    Creatinine, Ser 1.54 (*)    Calcium 8.7 (*)    GFR, Estimated 50 (*)    All other components within normal limits  CBC - Abnormal; Notable for the following components:   WBC 12.3 (*)    RBC 3.09 (*)    Hemoglobin 8.9 (*)    HCT 28.7 (*)    All other components within normal limits  CBG MONITORING, ED - Abnormal; Notable for the following components:   Glucose-Capillary 206 (*)    All other components within normal limits  TROPONIN I (HIGH SENSITIVITY) - Abnormal; Notable for the following components:   Troponin I (High Sensitivity) 18 (*)    All other components within normal limits  URINALYSIS, ROUTINE W REFLEX MICROSCOPIC  TROPONIN I (HIGH SENSITIVITY)    No orders to display    Medications - No data to display   Procedures  /  Critical Care Procedures  ED Course and Medical Decision Making  I have reviewed the triage vital signs, the nursing notes, and pertinent available records from the EMR.  Listed above are laboratory and imaging tests that I personally ordered, reviewed, and interpreted and then considered in my medical decision making (see below for details).  Suspect orthostatic hypotension with syncopal episode, no complaints at this time, reassuring vital signs.  Doubt PE given the lack of chest pain or shortness of breath, no tachycardia, no hypoxia, patient is already anticoagulated.  Will observe cardiac  monitor and await screening labs.     Labs are reassuring, troponin is minimally elevated but flat upon repeat.  Patient has been observed in the emergency department for 5 hours with no cardiac ectopy, looks and feels well, appropriate for discharge.  Barth Kirks. [place]onia Small, [place] [email or web address]  Final Clinical Impressions(s) / ED Diagnoses     ICD-10-CM   1. Syncope, unspecified syncope type  R55   2. Orthostatic hypotension  I95.1     ED Discharge Orders    None  Discharge Instructions Discussed with and Provided to Patient:     Discharge Instructions     You were evaluated in the Emergency Department and after careful evaluation, we did not find any emergent condition requiring admission or further testing in the hospital.  Your exam/testing today was overall reassuring.  Please return to the Emergency Department if you experience any worsening of your condition.  Thank you for allowing [place] to be a part of your care.        Maudie Flakes, MD 09/01/20 2115

## 2020-09-01 NOTE — Discharge Instructions (Addendum)
You were evaluated in the Emergency Department and after careful evaluation, we did not find any emergent condition requiring admission or further testing in the hospital.  Your exam/testing today was overall reassuring.  Please return to the Emergency Department if you experience any worsening of your condition.  Thank you for allowing us to be a part of your care.  

## 2020-09-02 DIAGNOSIS — I10 Essential (primary) hypertension: Secondary | ICD-10-CM | POA: Diagnosis not present

## 2020-09-02 DIAGNOSIS — E119 Type 2 diabetes mellitus without complications: Secondary | ICD-10-CM | POA: Diagnosis not present

## 2020-09-02 DIAGNOSIS — I951 Orthostatic hypotension: Secondary | ICD-10-CM | POA: Diagnosis not present

## 2020-09-04 DIAGNOSIS — E11622 Type 2 diabetes mellitus with other skin ulcer: Secondary | ICD-10-CM | POA: Diagnosis not present

## 2020-09-04 DIAGNOSIS — M6281 Muscle weakness (generalized): Secondary | ICD-10-CM | POA: Diagnosis not present

## 2020-09-04 DIAGNOSIS — L89893 Pressure ulcer of other site, stage 3: Secondary | ICD-10-CM | POA: Diagnosis not present

## 2020-09-11 DIAGNOSIS — L89893 Pressure ulcer of other site, stage 3: Secondary | ICD-10-CM | POA: Diagnosis not present

## 2020-09-11 DIAGNOSIS — E11622 Type 2 diabetes mellitus with other skin ulcer: Secondary | ICD-10-CM | POA: Diagnosis not present

## 2020-09-11 DIAGNOSIS — M6281 Muscle weakness (generalized): Secondary | ICD-10-CM | POA: Diagnosis not present

## 2020-09-18 DIAGNOSIS — L89893 Pressure ulcer of other site, stage 3: Secondary | ICD-10-CM | POA: Diagnosis not present

## 2020-09-18 DIAGNOSIS — E11622 Type 2 diabetes mellitus with other skin ulcer: Secondary | ICD-10-CM | POA: Diagnosis not present

## 2020-09-18 DIAGNOSIS — M6281 Muscle weakness (generalized): Secondary | ICD-10-CM | POA: Diagnosis not present

## 2020-09-19 DIAGNOSIS — T560X1D Toxic effect of lead and its compounds, accidental (unintentional), subsequent encounter: Secondary | ICD-10-CM | POA: Diagnosis not present

## 2020-09-19 DIAGNOSIS — G4733 Obstructive sleep apnea (adult) (pediatric): Secondary | ICD-10-CM | POA: Diagnosis not present

## 2020-09-19 DIAGNOSIS — M1019 Lead-induced gout, multiple sites: Secondary | ICD-10-CM | POA: Diagnosis not present

## 2020-09-19 DIAGNOSIS — I10 Essential (primary) hypertension: Secondary | ICD-10-CM | POA: Diagnosis not present

## 2020-09-19 DIAGNOSIS — E119 Type 2 diabetes mellitus without complications: Secondary | ICD-10-CM | POA: Diagnosis not present

## 2020-09-25 DIAGNOSIS — M6281 Muscle weakness (generalized): Secondary | ICD-10-CM | POA: Diagnosis not present

## 2020-09-25 DIAGNOSIS — E11622 Type 2 diabetes mellitus with other skin ulcer: Secondary | ICD-10-CM | POA: Diagnosis not present

## 2020-09-25 DIAGNOSIS — L89893 Pressure ulcer of other site, stage 3: Secondary | ICD-10-CM | POA: Diagnosis not present

## 2020-10-02 ENCOUNTER — Ambulatory Visit: Payer: Medicare HMO | Admitting: Cardiology

## 2020-10-02 ENCOUNTER — Other Ambulatory Visit: Payer: Self-pay

## 2020-10-02 ENCOUNTER — Encounter: Payer: Self-pay | Admitting: Cardiology

## 2020-10-02 VITALS — BP 130/70 | HR 64 | Ht 72.0 in | Wt 334.0 lb

## 2020-10-02 DIAGNOSIS — Z79899 Other long term (current) drug therapy: Secondary | ICD-10-CM

## 2020-10-02 DIAGNOSIS — M6281 Muscle weakness (generalized): Secondary | ICD-10-CM | POA: Diagnosis not present

## 2020-10-02 DIAGNOSIS — I10 Essential (primary) hypertension: Secondary | ICD-10-CM

## 2020-10-02 DIAGNOSIS — L89893 Pressure ulcer of other site, stage 3: Secondary | ICD-10-CM | POA: Diagnosis not present

## 2020-10-02 DIAGNOSIS — E11622 Type 2 diabetes mellitus with other skin ulcer: Secondary | ICD-10-CM | POA: Diagnosis not present

## 2020-10-02 NOTE — Progress Notes (Signed)
Cardiology Office Note:    Date:  10/02/2020   ID:  [name], DOB [birth year], MRN [id number]  PCP:  Biagio Borg, MD  Cardiologist:  Minus Breeding, MD  Electrophysiologist:  None   Referring MD: Biagio Borg, MD   Chief Complaint  Patient presents with  . Follow-up    Follow-up Hospital    History of Present Illness:    Gerald Day is a 65 y.o. male with a hx of DM, HTN, HLD, OA, OSA on CPAP, morbid obesity, and chronic LE edema who was seen by cardiology for the first time 07/30/2020 for AF in the setting of LE cellulitis and suspected sepsis.   Patient was in his usual state of health when he decided to join [employer] at the [facility].  He started doing swimming and other exercises.  He was unaware of a wound on his left great toe that had developed.  Initially he he was doing quite well but then he noticed increasing shortness of breath with exertion that caused him to stop.  His family noted that his toe was edematous and red.  He presented to an urgent care and was transferred to [facility].  On arrival to the ED 07/26/2020 his initial EKG showed "sinus tachycardia" but I suspect this was atrial flutter.  EKG on 07/29/2020 showed atrial fibrillation and cardiology was consulted. He was admitted and treated for cellulitis and suspected sepsis though his blood cultures were negative.  Venous Dopplers were negative for DVT.  Echocardiogram showed ejection fraction of 55 to 60% with mild LVH and mild left atrial dilatation.  He did have aortic root dilatation of 4.1 cm.  On 08/02/2020 the patient underwent TEE cardioversion to sinus rhythm.  He maintained normal sinus rhythm and was discharged.  He was discharged to [facility] inpatient rehab.  He was seen in the office 08/28/2020 for follow-up. At that visit his weight was 384 lbs.  He was taking Lasix 80 PRN.  I suggested he take Lasix 80 mg daily.    He returns today for follow-up. His son accompanied him to the  office. From a cardiac standpoint he is done well. His weight is down to 334 pounds. Blood pressure and heart rate are normal. He has chronic lower extremities edema with some warmth to the left calf. Overall he feels like he is improved. He denies any shortness of breath. He remains at [facility]. He tells me they plan to keep him until he is able to do 4 steps without assistance. Apparently he still pretty debilitated. When he is discharged he plans to return to his own home, he lives alone in his own house with family nearby.  Past Medical History:  Diagnosis Date  . Arthritis    IN KNEES  . Cellulitis 10/26/2013   LOWER RT EXTREMITY  . CELLULITIS/ABSCESS, LEG 06/27/2007  . COLONIC POLYPS   . CONGESTIVE HEART FAILURE   . DIABETES MELLITUS, TYPE II   . DIVERTICULOSIS, COLON   . ERECTILE DYSFUNCTION, ORGANIC   . GOUT NOS   . Headache   . HYPERLIPIDEMIA   . HYPERTENSION   . LOW BACK PAIN   . Morbid obesity ([place])   . SLEEP APNEA, OBSTRUCTIVE    USES CPAP    Past Surgical History:  Procedure Laterality Date  . APPENDECTOMY    . CARDIOVERSION N/A 08/02/2020   Procedure: CARDIOVERSION;  Surgeon: Sanda Klein, MD;  Location: [place];  Service: Cardiovascular;  Laterality: N/A;  . FOOT SURGERY     LT  . TEE WITHOUT CARDIOVERSION N/A 01-Sep-2020   Procedure: TRANSESOPHAGEAL ECHOCARDIOGRAM (TEE);  Surgeon: Sanda Klein, MD;  Location: [place];  Service: Cardiovascular;  Laterality: N/A;  . TENOTOMY / FLEXOR TENDON TRANSFER Right 03/15/2015   Procedure: TENOTOMY HT REPAIR/ULCER DEBRIDEMENT/GRAFT PREP/ACELL GRAFT APPLICATION;  Surgeon: Jana Half, DPM;  Location: [place];  Service: Podiatry;  Laterality: Right;  HALLUX    Current Medications: Current Meds  Medication Sig  . allopurinol (ZYLOPRIM) 100 MG tablet Take 100 mg by mouth daily.  Marland Kitchen atorvastatin (LIPITOR) 10 MG tablet TAKE 1 TABLET (10 MG TOTAL) BY MOUTH DAILY.  Marland Kitchen Cholecalciferol (THERA-D 2000) 50 MCG  (2000 UT) TABS 1 tab by mouth once daily  . furosemide (LASIX) 80 MG tablet Take 80 mg by mouth daily.  Marland Kitchen glipiZIDE (GLUCOTROL XL) 5 MG 24 hr tablet TAKE 1 TABLET BY MOUTH EVERY DAY WITH BREAKFAST (Patient taking differently: Take 5 mg by mouth daily with breakfast.)  . meclizine (ANTIVERT) 25 MG tablet Take 25 mg by mouth every 6 (six) hours as needed for dizziness.  Marland Kitchen oxyCODONE (OXY IR/ROXICODONE) 5 MG immediate release tablet Take 1 tablet (5 mg total) by mouth every 6 (six) hours as needed for moderate pain.  . potassium chloride SA (KLOR-CON M20) 20 MEQ tablet Take 1 tablet (20 mEq total) by mouth daily.  . vitamin B-12 (CYANOCOBALAMIN) 1000 MCG tablet Take 1 tablet (1,000 mcg total) by mouth daily.  . [DISCONTINUED] indomethacin (INDOCIN) 50 MG capsule Take 50 mg by mouth every 8 (eight) hours as needed (inflamation).     Allergies:   Penicillins and Tizanidine   Social History   Socioeconomic History  . Marital status: Married    Spouse name: Not on file  . Number of children: Not on file  . Years of education: Not on file  . Highest education level: Not on file  Occupational History  . Occupation: retired  Tobacco Use  . Smoking status: Never Smoker  . Smokeless tobacco: Never Used  Vaping Use  . Vaping Use: Never used  Substance and Sexual Activity  . Alcohol use: Not Currently    Alcohol/week: 3.0 - 4.0 standard drinks    Types: 3 - 4 Cans of beer per week    Comment: hardly any; h/o heavy use  . Drug use: No  . Sexual activity: Not on file  Other Topics Concern  . Not on file  Social History Narrative  . Not on file   Social Determinants of Health   Financial Resource Strain: Not on file  Food Insecurity: Not on file  Transportation Needs: Not on file  Physical Activity: Not on file  Stress: Not on file  Social Connections: Not on file     Family History: The patient's family history includes Asthma in an other family member; Cardiomyopathy in his  father.  ROS:   Please see the history of present illness.     All other systems reviewed and are negative.  EKGs/Labs/Other Studies Reviewed:    The following studies were reviewed today: TEE Sep 01, 2020-  IMPRESSIONS    1. Left ventricular ejection fraction, by estimation, is 50 to 55%. The  left ventricle has low normal function. The left ventricle has no regional  wall motion abnormalities.  2. Right ventricular systolic function is normal. The right ventricular  size is normal. There is normal pulmonary artery systolic pressure.  3. No left atrial/left atrial appendage  thrombus was detected. The LAA  emptying velocity was 60 cm/s.  4. The mitral valve is normal in structure. Trivial mitral valve  regurgitation. No evidence of mitral stenosis.  5. The aortic valve is normal in structure. Aortic valve regurgitation is  not visualized. No aortic stenosis is present.  6. The inferior vena cava is normal in size with greater than 50%  respiratory variability, suggesting right atrial pressure of 3 mmHg.  7. Evidence of atrial level shunting detected by color flow Doppler.  There is a very small patent foramen ovale with a tiny amount of  bidirectional shunting across atrial septum.   EKG:  EKG is not ordered today.  The ekg ordered 09/01/2020 demonstrates NSR, HR 68  Recent Labs: 12/18/2019: TSH 2.23 07/26/2020: ALT 22 07/29/2020: B Natriuretic Peptide 356.8 08/07/2020: Magnesium 2.2 09/01/2020: BUN 25; Creatinine, Ser 1.54; Hemoglobin 8.9; Platelets 321; Potassium 4.7; Sodium 131  Recent Lipid Panel    Component Value Date/Time   CHOL 170 06/27/2020 0745   TRIG 166.0 (H) 06/27/2020 0745   HDL 43.90 06/27/2020 0745   CHOLHDL 4 06/27/2020 0745   VLDL 33.2 06/27/2020 0745   LDLCALC 93 06/27/2020 0745   LDLDIRECT 83.0 12/19/2018 0728    Physical Exam:    VS:  BP 130/70 (BP Location: Left Arm, Patient Position: Sitting)   Pulse 64   Ht 6' (1.829 m)   Wt (!) 334  lb (151.5 kg)   SpO2 99%   BMI 45.30 kg/m     Wt Readings from Last 3 Encounters:  10/02/20 (!) 334 lb (151.5 kg)  09/01/20 (!) 359 lb 6.4 oz (163 kg)  08/28/20 (!) 384 lb (174.2 kg)     GEN: morbidly obese male in a wheelchair,  in no acute distress HEENT: Normal NECK: No JVD; No carotid bruits CARDIAC: RRR, no murmurs, rubs, gallops RESPIRATORY:  Clear to auscultation without rales, wheezing or rhonchi  ABDOMEN: Obese, non-tender, non-distended MUSCULOSKELETAL:  Chronic LE edema, some warmth to his left calf c/w right; No deformity  SKIN: Warm and dry NEUROLOGIC:  Alert and oriented x 3 PSYCHIATRIC:  Normal affect   ASSESSMENT:    PAF (paroxysmal atrial fibrillation) (HCC) PAF Nov 2021 in setting of LE cellulitis- s/p TEE CV 08/02/2020. NSR today on exam.   Anticoagulated On Eliquis- CHADS VASC=3  Diabetes mellitus type 2 in obese (HCC) On oral agents  OSA (obstructive sleep apnea) Reports compliance with C-pap  Morbid obesity (HCC) BMI 45  Essential hypertension Controlled on current medications- Echo showed preserved LVF, mild LVH  Edema leg Chronic LE edema- improved on daily Lasix 80 mg  Stage 3a chronic kidney disease (HCC) I reviewed past labs.  His SCr was near his baseline at discharge. Check BMP today.    PLAN:    Check BMP- f/u Dr Antoine Poche in 6 months. They asked about continuing his Eliquis and I suggested he do so, that he was still at risk for recurrent atrial fibrillation as well as possible DVT secondary to obesity and debilitated state.   Medication Adjustments/Labs and Tests Ordered: Current medicines are reviewed at length with the patient today.  Concerns regarding medicines are outlined above.  Orders Placed This Encounter  Procedures  . Basic Metabolic Panel (BMET)   No orders of the defined types were placed in this encounter.   Patient Instructions  Medication Instructions:  Continue current medications  *If you need a  refill on your cardiac medications before your next appointment, please call your  pharmacy*   Lab Work: [employer]    If you have labs (blood work) drawn today and your tests are completely normal, you will receive your results only by: Marland Kitchen MyChart Message (if you have MyChart) OR . A paper copy in the mail If you have any lab test that is abnormal or we need to change your treatment, we will call you to review the results.   Testing/Procedures: None Ordered   Follow-Up: At [facility], you and your health needs are our priority.  As part of our continuing mission to provide you with exceptional heart care, we have created designated Provider Care Teams.  These Care Teams include your primary Cardiologist (physician) and Advanced Practice Providers (APPs -  Physician Assistants and Nurse Practitioners) who all work together to provide you with the care you need, when you need it.  We recommend signing up for the patient portal called "MyChart".  Sign up information is provided on this After Visit Summary.  MyChart is used to connect with patients for Virtual Visits (Telemedicine).  Patients are able to view lab/test results, encounter notes, upcoming appointments, etc.  Non-urgent messages can be sent to your provider as well.   To learn more about what you can do with MyChart, go to [email or web address].    Your next appointment:   6 month(s)  The format for your next appointment:   In Person  Provider:   You may see Minus Breeding, MD or one of the following Advanced Practice Providers on your designated Care Team:    Rosaria Ferries, PA-C  Jory Sims, DNP, ANP        Signed, Kerin Ransom, PA-C  10/02/2020 2:52 PM    [place]

## 2020-10-02 NOTE — Patient Instructions (Signed)
Medication Instructions:  Continue current medications  *If you need a refill on your cardiac medications before your next appointment, please call your pharmacy*   Lab Work: BMP    If you have labs (blood work) drawn today and your tests are completely normal, you will receive your results only by: Marland Kitchen MyChart Message (if you have MyChart) OR . A paper copy in the mail If you have any lab test that is abnormal or we need to change your treatment, we will call you to review the results.   Testing/Procedures: None Ordered   Follow-Up: At [facility], you and your health needs are our priority.  As part of our continuing mission to provide you with exceptional heart care, we have created designated Provider Care Teams.  These Care Teams include your primary Cardiologist (physician) and Advanced Practice Providers (APPs -  Physician Assistants and Nurse Practitioners) who all work together to provide you with the care you need, when you need it.  We recommend signing up for the patient portal called "MyChart".  Sign up information is provided on this After Visit Summary.  MyChart is used to connect with patients for Virtual Visits (Telemedicine).  Patients are able to view lab/test results, encounter notes, upcoming appointments, etc.  Non-urgent messages can be sent to your provider as well.   To learn more about what you can do with MyChart, go to [email or web address].    Your next appointment:   6 month(s)  The format for your next appointment:   In Person  Provider:   You may see Minus Breeding, MD or one of the following Advanced Practice Providers on your designated Care Team:    Rosaria Ferries, PA-C  Jory Sims, DNP, ANP

## 2020-10-03 LAB — BASIC METABOLIC PANEL
BUN/Creatinine Ratio: 21 (ref 10–24)
BUN: 35 mg/dL — ABNORMAL HIGH (ref 8–27)
CO2: 21 mmol/L (ref 20–29)
Calcium: 9.6 mg/dL (ref 8.6–10.2)
Chloride: 103 mmol/L (ref 96–106)
Creatinine, Ser: 1.66 mg/dL — ABNORMAL HIGH (ref 0.76–1.27)
GFR calc Af Amer: 49 mL/min/{1.73_m2} — ABNORMAL LOW (ref >59)
GFR calc non Af Amer: 43 mL/min/{1.73_m2} — ABNORMAL LOW (ref >59)
Glucose: 135 mg/dL — ABNORMAL HIGH (ref 65–99)
Potassium: 5.1 mmol/L (ref 3.5–5.2)
Sodium: 141 mmol/L (ref 134–144)

## 2020-10-04 ENCOUNTER — Telehealth: Payer: Self-pay | Admitting: *Deleted

## 2020-10-04 DIAGNOSIS — Z79899 Other long term (current) drug therapy: Secondary | ICD-10-CM

## 2020-10-04 NOTE — Telephone Encounter (Signed)
-----   Message from Erlene Quan, [place] sent at 10/03/2020  7:54 AM EST ----- Hebert Soho can you let the rehab center know to decrease Mr Husak's Lasix to 80 mg MWF- 40 mg other days.  He needs a repeat BMP in a month.  Kerin Ransom PA-C 10/03/2020 7:54 AM

## 2020-10-04 NOTE — Telephone Encounter (Signed)
Spoke with Nurse at [employer] and give result of pt blood work and medications changes, BMP ordered and mailed to pt home,because he will be home from rehab the following week

## 2020-10-08 DIAGNOSIS — E785 Hyperlipidemia, unspecified: Secondary | ICD-10-CM | POA: Diagnosis not present

## 2020-10-08 DIAGNOSIS — L03116 Cellulitis of left lower limb: Secondary | ICD-10-CM | POA: Diagnosis not present

## 2020-10-08 DIAGNOSIS — I4892 Unspecified atrial flutter: Secondary | ICD-10-CM | POA: Diagnosis not present

## 2020-10-09 DIAGNOSIS — M6281 Muscle weakness (generalized): Secondary | ICD-10-CM | POA: Diagnosis not present

## 2020-10-09 DIAGNOSIS — E11622 Type 2 diabetes mellitus with other skin ulcer: Secondary | ICD-10-CM | POA: Diagnosis not present

## 2020-10-09 DIAGNOSIS — L89893 Pressure ulcer of other site, stage 3: Secondary | ICD-10-CM | POA: Diagnosis not present

## 2020-10-11 DIAGNOSIS — E119 Type 2 diabetes mellitus without complications: Secondary | ICD-10-CM | POA: Diagnosis not present

## 2020-10-11 DIAGNOSIS — E785 Hyperlipidemia, unspecified: Secondary | ICD-10-CM | POA: Diagnosis not present

## 2020-10-11 DIAGNOSIS — G4733 Obstructive sleep apnea (adult) (pediatric): Secondary | ICD-10-CM | POA: Diagnosis not present

## 2020-10-11 DIAGNOSIS — M109 Gout, unspecified: Secondary | ICD-10-CM | POA: Diagnosis not present

## 2020-10-11 DIAGNOSIS — I4892 Unspecified atrial flutter: Secondary | ICD-10-CM | POA: Diagnosis not present

## 2020-10-11 DIAGNOSIS — I10 Essential (primary) hypertension: Secondary | ICD-10-CM | POA: Diagnosis not present

## 2020-10-14 ENCOUNTER — Other Ambulatory Visit: Payer: Self-pay

## 2020-10-14 NOTE — Patient Outreach (Signed)
[facility]) Care Management  10/14/2020  KAVAUGHN FAUCETT [birth year] [id number]    Transition of Care Referral  Referral Date: 10/14/2020 Referral Source: [facility] discharge Report Date of Discharge: 0/11/4915 Facility: [place]: [facility]   Incoming call from patient returning RN CM call. He reports that he is doing well and glad to be back home. He denies any acute issues or concerns at present. He lives alone but voices he has supportive family who is assisting him as needed. He is scheduled to get [facility] services and they contacted him today to set up visit. He voices that the has all his meds and uses [facility] mail order. He still needs to call PCP office to make follow up appt but voiced he would do so soon. He confirms he has transportation to get to appt. He denies any RN CM needs or concerns a this time. patient was appreciative of follow up call.   Plan:  RN CM will close case at this time.  Enzo Montgomery, RN,BSN,CCM [place] Management Telephonic Care Management Coordinator Direct Phone: [phone] Toll Free: [phone] Fax: [phone]

## 2020-10-14 NOTE — Patient Outreach (Signed)
[facility]) Care Management  10/14/2020  Gerald Day 10-May-1956 [id number]     Transition of Care Referral  Referral Date: 10/14/2020 Referral Source: [facility] discharge Report Date of Discharge: 6/54/6503 Facility: [place]: [facility]    Outreach attempt # 1 to patient. No answer. RN CM left HIPAA compliant voicemail message along with contact info.   Plan: RN CM will make outreach attempt to patient within 3-4 business days. RN CM will send unsuccessful outreach letter to patient.   Enzo Montgomery, RN,BSN,CCM [place] Management Telephonic Care Management Coordinator Direct Phone: [phone] Toll Free: [phone] Fax: [phone]

## 2020-10-17 ENCOUNTER — Telehealth: Payer: Self-pay | Admitting: Internal Medicine

## 2020-10-17 DIAGNOSIS — T560X1D Toxic effect of lead and its compounds, accidental (unintentional), subsequent encounter: Secondary | ICD-10-CM | POA: Diagnosis not present

## 2020-10-17 DIAGNOSIS — E785 Hyperlipidemia, unspecified: Secondary | ICD-10-CM | POA: Diagnosis not present

## 2020-10-17 DIAGNOSIS — I130 Hypertensive heart and chronic kidney disease with heart failure and stage 1 through stage 4 chronic kidney disease, or unspecified chronic kidney disease: Secondary | ICD-10-CM | POA: Diagnosis not present

## 2020-10-17 DIAGNOSIS — M1019 Lead-induced gout, multiple sites: Secondary | ICD-10-CM | POA: Diagnosis not present

## 2020-10-17 DIAGNOSIS — G4733 Obstructive sleep apnea (adult) (pediatric): Secondary | ICD-10-CM | POA: Diagnosis not present

## 2020-10-17 DIAGNOSIS — I890 Lymphedema, not elsewhere classified: Secondary | ICD-10-CM | POA: Diagnosis not present

## 2020-10-17 DIAGNOSIS — E1122 Type 2 diabetes mellitus with diabetic chronic kidney disease: Secondary | ICD-10-CM | POA: Diagnosis not present

## 2020-10-17 DIAGNOSIS — I509 Heart failure, unspecified: Secondary | ICD-10-CM | POA: Diagnosis not present

## 2020-10-17 DIAGNOSIS — N183 Chronic kidney disease, stage 3 unspecified: Secondary | ICD-10-CM | POA: Diagnosis not present

## 2020-10-17 NOTE — Telephone Encounter (Signed)
Ok for verbals 

## 2020-10-17 NOTE — Telephone Encounter (Signed)
   Moshe Salisbury from [facility] calling for verbal orders, would like nursing frequency for 1 week 1, 2 week 3, 1 week 4. She would like for the patient to do unna boot therapy and transition him to [employer]. Before she can start that she needs an ADI and would like for mobile xray to come out to the house.  Okay to LVM  [place]- [phone]

## 2020-10-18 ENCOUNTER — Telehealth: Payer: Self-pay

## 2020-10-18 NOTE — Telephone Encounter (Signed)
Verbals given  

## 2020-10-20 ENCOUNTER — Other Ambulatory Visit: Payer: Self-pay | Admitting: Internal Medicine

## 2020-10-20 MED ORDER — CARVEDILOL 12.5 MG PO TABS: 12.5000 mg | ORAL_TABLET | Freq: Two times a day (BID) | ORAL | 2 refills | Status: DC

## 2020-10-20 MED ORDER — DILTIAZEM HCL ER COATED BEADS 180 MG PO CP24: 180.0000 mg | ORAL_CAPSULE | Freq: Every day | ORAL | 2 refills | Status: DC

## 2020-10-20 MED ORDER — APIXABAN 5 MG PO TABS: 5.0000 mg | ORAL_TABLET | Freq: Two times a day (BID) | ORAL | 2 refills | Status: DC

## 2020-10-22 ENCOUNTER — Inpatient Hospital Stay: Payer: Medicare HMO | Admitting: Internal Medicine

## 2020-10-22 ENCOUNTER — Other Ambulatory Visit: Payer: Self-pay

## 2020-10-22 ENCOUNTER — Telehealth: Payer: Self-pay | Admitting: Internal Medicine

## 2020-10-22 DIAGNOSIS — T560X1D Toxic effect of lead and its compounds, accidental (unintentional), subsequent encounter: Secondary | ICD-10-CM | POA: Diagnosis not present

## 2020-10-22 DIAGNOSIS — G4733 Obstructive sleep apnea (adult) (pediatric): Secondary | ICD-10-CM | POA: Diagnosis not present

## 2020-10-22 DIAGNOSIS — I890 Lymphedema, not elsewhere classified: Secondary | ICD-10-CM | POA: Diagnosis not present

## 2020-10-22 DIAGNOSIS — N183 Chronic kidney disease, stage 3 unspecified: Secondary | ICD-10-CM | POA: Diagnosis not present

## 2020-10-22 DIAGNOSIS — E1122 Type 2 diabetes mellitus with diabetic chronic kidney disease: Secondary | ICD-10-CM | POA: Diagnosis not present

## 2020-10-22 DIAGNOSIS — E785 Hyperlipidemia, unspecified: Secondary | ICD-10-CM | POA: Diagnosis not present

## 2020-10-22 DIAGNOSIS — I509 Heart failure, unspecified: Secondary | ICD-10-CM | POA: Diagnosis not present

## 2020-10-22 DIAGNOSIS — I130 Hypertensive heart and chronic kidney disease with heart failure and stage 1 through stage 4 chronic kidney disease, or unspecified chronic kidney disease: Secondary | ICD-10-CM | POA: Diagnosis not present

## 2020-10-22 DIAGNOSIS — M1019 Lead-induced gout, multiple sites: Secondary | ICD-10-CM | POA: Diagnosis not present

## 2020-10-22 NOTE — Telephone Encounter (Signed)
Please to contact pt to verify meds and dosing  Coreg 12.5 bid Clonidine 0.2 bid Diltiazem ER 180 mg per day  If this correct, ask pt to monitor BP at home at least twice per day and call with results on Friday feb 18 (also with HRs if he has this on the monitor)  If not correct, let me know what he is actually taking

## 2020-10-22 NOTE — Telephone Encounter (Signed)
[name] from [facility] calling to report a blood pressure On the right- 192/102 On the left- 210/110 He is asymptomatic and taking his medication as written Gerald Day- Welcome to lvm

## 2020-10-23 ENCOUNTER — Encounter: Payer: Self-pay | Admitting: Internal Medicine

## 2020-10-23 ENCOUNTER — Other Ambulatory Visit: Payer: Self-pay

## 2020-10-23 ENCOUNTER — Ambulatory Visit (INDEPENDENT_AMBULATORY_CARE_PROVIDER_SITE_OTHER): Payer: Medicare HMO | Admitting: Internal Medicine

## 2020-10-23 ENCOUNTER — Telehealth: Payer: Self-pay | Admitting: Internal Medicine

## 2020-10-23 VITALS — BP 134/76 | HR 61 | Temp 97.9°F | Ht 72.0 in | Wt 350.0 lb

## 2020-10-23 DIAGNOSIS — N1831 Chronic kidney disease, stage 3a: Secondary | ICD-10-CM | POA: Diagnosis not present

## 2020-10-23 DIAGNOSIS — G4733 Obstructive sleep apnea (adult) (pediatric): Secondary | ICD-10-CM | POA: Diagnosis not present

## 2020-10-23 DIAGNOSIS — M25561 Pain in right knee: Secondary | ICD-10-CM

## 2020-10-23 DIAGNOSIS — I10 Essential (primary) hypertension: Secondary | ICD-10-CM

## 2020-10-23 MED ORDER — CLONIDINE HCL 0.2 MG PO TABS: 0.2000 mg | ORAL_TABLET | Freq: Two times a day (BID) | ORAL | 3 refills | Status: DC

## 2020-10-23 MED ORDER — APIXABAN 5 MG PO TABS: 5.0000 mg | ORAL_TABLET | Freq: Two times a day (BID) | ORAL | 3 refills | Status: DC

## 2020-10-23 MED ORDER — METFORMIN HCL 500 MG PO TABS: 1000.0000 mg | ORAL_TABLET | Freq: Two times a day (BID) | ORAL | 3 refills | Status: DC

## 2020-10-23 MED ORDER — ALLOPURINOL 100 MG PO TABS: 100.0000 mg | ORAL_TABLET | Freq: Every day | ORAL | 3 refills | Status: DC

## 2020-10-23 MED ORDER — FUROSEMIDE 80 MG PO TABS: 80.0000 mg | ORAL_TABLET | Freq: Every day | ORAL | 3 refills | Status: DC

## 2020-10-23 MED ORDER — DILTIAZEM HCL ER COATED BEADS 180 MG PO CP24: 180.0000 mg | ORAL_CAPSULE | Freq: Every day | ORAL | 3 refills | Status: DC

## 2020-10-23 NOTE — Telephone Encounter (Signed)
   [place] Name: [place] Name:[facility] Callback Phone #: [phone] Service Requested: PT Frequency of Visits: [phone]

## 2020-10-23 NOTE — Patient Instructions (Signed)
Ok to use the OTC Voltaren gel to the right knee  Please follow up with orthopedic if the knee gets worse  You will be contacted regarding the referral for: pulmonary for the sleep apnea  Please continue all other medications as before, and refills have been done if requested.  Please have the pharmacy call with any other refills you may need.  Please continue your efforts at being more active, low cholesterol diet, and weight control..  Please keep your appointments with your specialists as you may have planned - cardiology, and kidney doctors

## 2020-10-23 NOTE — Telephone Encounter (Signed)
Pt states he will do but he also wants to be seen appt is at 3pm today

## 2020-10-23 NOTE — Progress Notes (Signed)
Patient ID: Gerald Day, male   DOB: [birth year], 65 y.o.   MRN: [id number]        Chief Complaint: follow up recent hospn for LLE cellulitis/left great toe wound with suspected sepsis, complicated by new onset afib/flutter now s/p TEE cardioversion       HPI:  Gerald Day is a 65 y.o. male here with above, has seen cardiology dec 22 and  jan 26; he was asked to take lasix 80 qd, not prn, with wt going from 359 to 334, but with different scale seems increased to 350 again despite good compliance with med, and pt states he sees no change in leg swelling, and admits to dietary indescretion and increased calories.  He was able to graduate [facility], now home with PT with walker, apparently making slow improvement in level of activity.  Today his only c/o increased right medial knee pain with increased activity without swelling, now mild to mod, intermittent, worse with walking, better with sitting, not assoc with knee swelling he can tell, and no giveaways or falls.  Has seen ortho in past and willing to see again.  He is also due for pulm f/u for his sleep apnea, and plans to f/u with renal as well for CKD as planned.  Pt denies chest pain, increased sob or doe, wheezing, orthopnea, PND, palpitations, dizziness or syncope.  Pt denies polydipsia, polyuria, Denies new focal neuro s/s.   Pt denies fever, wt loss, night sweats, loss of appetite, or other constitutional symptoms  Wt Readings from Last 3 Encounters:  10/23/20 (!) 350 lb (158.8 kg)  10/02/20 (!) 334 lb (151.5 kg)  09/01/20 (!) 359 lb 6.4 oz (163 kg)   BP Readings from Last 3 Encounters:  10/23/20 134/76  10/02/20 130/70  09/01/20 (!) 155/67         Past Medical History:  Diagnosis Date  . Arthritis    IN KNEES  . Cellulitis 10/26/2013   LOWER RT EXTREMITY  . CELLULITIS/ABSCESS, LEG 06/27/2007  . COLONIC POLYPS   . CONGESTIVE HEART FAILURE   . DIABETES MELLITUS, TYPE II   . DIVERTICULOSIS, COLON   . ERECTILE  DYSFUNCTION, ORGANIC   . GOUT NOS   . Headache   . HYPERLIPIDEMIA   . HYPERTENSION   . LOW BACK PAIN   . Morbid obesity ([place])   . SLEEP APNEA, OBSTRUCTIVE    USES CPAP   Past Surgical History:  Procedure Laterality Date  . APPENDECTOMY    . CARDIOVERSION N/A 08/02/2020   Procedure: CARDIOVERSION;  Surgeon: Sanda Klein, MD;  [facility];  Service: Cardiovascular;  Laterality: N/A;  . FOOT SURGERY     LT  . TEE WITHOUT CARDIOVERSION N/A 08/02/2020   Procedure: TRANSESOPHAGEAL ECHOCARDIOGRAM (TEE);  Surgeon: Sanda Klein, MD;  Location: [facility];  Service: Cardiovascular;  Laterality: N/A;  . TENOTOMY / FLEXOR TENDON TRANSFER Right 03/15/2015   Procedure: TENOTOMY HT REPAIR/ULCER DEBRIDEMENT/GRAFT PREP/ACELL GRAFT APPLICATION;  Surgeon: Jana Half, DPM;  Location: [facility];  Service: Podiatry;  Laterality: Right;  HALLUX    reports that he has never smoked. He has never used smokeless tobacco. He reports previous alcohol use of about 3.0 - 4.0 standard drinks of alcohol per week. He reports that he does not use drugs. family history includes Asthma in an other family member; Cardiomyopathy in his father. Allergies  Allergen Reactions  . Penicillins Other (See Comments)    Unknown childhood allergic reaction Has patient had  a PCN reaction causing immediate rash, facial/tongue/throat swelling, SOB or lightheadedness with hypotension: Unknown Has patient had a PCN reaction causing severe rash involving mucus membranes or skin necrosis: Unknown Has patient had a PCN reaction that required hospitalization: No Has patient had a PCN reaction occurring within the last 10 years: No If all of the above answers are "NO", then may proceed with Cephalosporin use.   . Tizanidine Other (See Comments)    07/26/20: Pt does not recognize drug and does not remember being allergic to it.   Current Outpatient Medications on File Prior to Visit  Medication Sig Dispense Refill  .  atorvastatin (LIPITOR) 10 MG tablet TAKE 1 TABLET (10 MG TOTAL) BY MOUTH DAILY. 90 tablet 3  . carvedilol (COREG) 12.5 MG tablet Take 1 tablet (12.5 mg total) by mouth 2 (two) times daily with a meal. 180 tablet 2  . Cholecalciferol (THERA-D 2000) 50 MCG (2000 UT) TABS 1 tab by mouth once daily 90 tablet 3  . glipiZIDE (GLUCOTROL XL) 5 MG 24 hr tablet TAKE 1 TABLET BY MOUTH EVERY DAY WITH BREAKFAST (Patient taking differently: Take 5 mg by mouth daily with breakfast.) 90 tablet 1  . meclizine (ANTIVERT) 25 MG tablet Take 25 mg by mouth every 6 (six) hours as needed for dizziness.    . potassium chloride SA (KLOR-CON M20) 20 MEQ tablet Take 1 tablet (20 mEq total) by mouth daily. 90 tablet 3  . vitamin B-12 (CYANOCOBALAMIN) 1000 MCG tablet Take 1 tablet (1,000 mcg total) by mouth daily. 90 tablet 3   No current facility-administered medications on file prior to visit.        ROS:  All others reviewed and negative.  Objective        PE:  BP 134/76   Pulse 61   Temp 97.9 F (36.6 C) (Oral)   Ht 6' (1.829 m)   Wt (!) 350 lb (158.8 kg)   SpO2 99%   BMI 47.47 kg/m                 Constitutional: Pt appears in NAD               HENT: Head: NCAT.                Right Ear: External ear normal.                 Left Ear: External ear normal.                Eyes: . Pupils are equal, round, and reactive to light. Conjunctivae and EOM are normal               Nose: without d/c or deformity               Neck: Neck supple. Gross normal ROM               Cardiovascular: Normal rate and regular rhythm.                 Pulmonary/Chest: Effort normal and breath sounds without rales or wheezing.                Right knee with medial soreness over the joint line without knee effusion               Neurological: Pt is alert. At baseline orientation, motor grossly intact               Skin:  Skin is warm. No rashes, no other new lesions, LE edema - 1+ bilat               Psychiatric: Pt behavior is  normal without agitation   Micro: none  Cardiac tracings I have personally interpreted today:  none  Pertinent Radiological findings (summarize): none   Lab Results  Component Value Date   WBC 12.3 (H) 09/01/2020   HGB 8.9 (L) 09/01/2020   HCT 28.7 (L) 09/01/2020   PLT 321 09/01/2020   GLUCOSE 135 (H) 10/02/2020   CHOL 170 06/27/2020   TRIG 166.0 (H) 06/27/2020   HDL 43.90 06/27/2020   LDLDIRECT 83.0 12/19/2018   LDLCALC 93 06/27/2020   ALT 22 07/26/2020   AST 21 07/26/2020   NA 141 10/02/2020   K 5.1 10/02/2020   CL 103 10/02/2020   CREATININE 1.66 (H) 10/02/2020   BUN 35 (H) 10/02/2020   CO2 21 10/02/2020   TSH 2.23 12/18/2019   PSA 0.46 12/18/2019   INR 1.4 (H) 07/26/2020   HGBA1C 7.0 (H) 06/27/2020   MICROALBUR 312.3 (H) 12/18/2019   Assessment/Plan:  Gerald Day is a 65 y.o. White or Caucasian [1] male with  has a past medical history of Arthritis, Cellulitis (10/26/2013), CELLULITIS/ABSCESS, LEG (06/27/2007), COLONIC POLYPS, CONGESTIVE HEART FAILURE, DIABETES MELLITUS, TYPE II, DIVERTICULOSIS, COLON, ERECTILE DYSFUNCTION, ORGANIC, GOUT NOS, Headache, HYPERLIPIDEMIA, HYPERTENSION, LOW BACK PAIN, Morbid obesity ([place]), and SLEEP APNEA, OBSTRUCTIVE.  OSA (obstructive sleep apnea) Due for pulm f/u, cont same tx, will refer  Stage 3a chronic kidney disease ([place]) Lab Results  Component Value Date   CREATININE 1.66 (H) 10/02/2020   Stable overall, cont to avoid nephrotoxins   Right knee pain Mild, likely djd related, for topical voltaren gel prn, and to ortho f/u if not improved or worsens with giveaways or falls  Essential hypertension BP Readings from Last 3 Encounters:  10/23/20 134/76  10/02/20 130/70  09/01/20 (!) 155/67   Stable, pt to continue medical treatment  - coreg, catapress, cardizem, lasix,   Current Outpatient Medications (Endocrine & Metabolic):  .  glipiZIDE (GLUCOTROL XL) 5 MG 24 hr tablet, TAKE 1 TABLET BY MOUTH EVERY DAY WITH  BREAKFAST (Patient taking differently: Take 5 mg by mouth daily with breakfast.) .  metFORMIN (GLUCOPHAGE) 500 MG tablet, Take 2 tablets (1,000 mg total) by mouth 2 (two) times daily with a meal.  Current Outpatient Medications (Cardiovascular):  .  atorvastatin (LIPITOR) 10 MG tablet, TAKE 1 TABLET (10 MG TOTAL) BY MOUTH DAILY. .  carvedilol (COREG) 12.5 MG tablet, Take 1 tablet (12.5 mg total) by mouth 2 (two) times daily with a meal. .  cloNIDine (CATAPRES) 0.2 MG tablet, Take 1 tablet (0.2 mg total) by mouth 2 (two) times daily. Marland Kitchen  diltiazem (CARDIZEM CD) 180 MG 24 hr capsule, Take 1 capsule (180 mg total) by mouth daily. .  furosemide (LASIX) 80 MG tablet, Take 1 tablet (80 mg total) by mouth daily.   Current Outpatient Medications (Analgesics):  .  allopurinol (ZYLOPRIM) 100 MG tablet, Take 1 tablet (100 mg total) by mouth daily.  Current Outpatient Medications (Hematological):  .  vitamin B-12 (CYANOCOBALAMIN) 1000 MCG tablet, Take 1 tablet (1,000 mcg total) by mouth daily. Marland Kitchen  apixaban (ELIQUIS) 5 MG TABS tablet, Take 1 tablet (5 mg total) by mouth 2 (two) times daily.  Current Outpatient Medications (Other):  Marland Kitchen  Cholecalciferol (THERA-D 2000) 50 MCG (2000 UT) TABS, 1 tab by mouth once daily .  meclizine (  ANTIVERT) 25 MG tablet, Take 25 mg by mouth every 6 (six) hours as needed for dizziness. .  potassium chloride SA (KLOR-CON M20) 20 MEQ tablet, Take 1 tablet (20 mEq total) by mouth daily.   Followup: Return in about 2 months (around 01/02/2021).  Cathlean Cower, MD 10/26/2020 11:44 PM [facility]

## 2020-10-24 ENCOUNTER — Telehealth: Payer: Self-pay | Admitting: Internal Medicine

## 2020-10-24 NOTE — Telephone Encounter (Signed)
[place] Name: [place] Name: Annamarie Major Phone #: [phone] Service Requested: OT re-eval  Frequency of Visits:1 visit in March to follow up on OT needs  After wearing Unna boot

## 2020-10-25 DIAGNOSIS — I509 Heart failure, unspecified: Secondary | ICD-10-CM | POA: Diagnosis not present

## 2020-10-25 DIAGNOSIS — I130 Hypertensive heart and chronic kidney disease with heart failure and stage 1 through stage 4 chronic kidney disease, or unspecified chronic kidney disease: Secondary | ICD-10-CM | POA: Diagnosis not present

## 2020-10-25 DIAGNOSIS — G4733 Obstructive sleep apnea (adult) (pediatric): Secondary | ICD-10-CM | POA: Diagnosis not present

## 2020-10-25 DIAGNOSIS — E1122 Type 2 diabetes mellitus with diabetic chronic kidney disease: Secondary | ICD-10-CM | POA: Diagnosis not present

## 2020-10-25 DIAGNOSIS — N183 Chronic kidney disease, stage 3 unspecified: Secondary | ICD-10-CM | POA: Diagnosis not present

## 2020-10-25 DIAGNOSIS — T560X1D Toxic effect of lead and its compounds, accidental (unintentional), subsequent encounter: Secondary | ICD-10-CM | POA: Diagnosis not present

## 2020-10-25 DIAGNOSIS — M1019 Lead-induced gout, multiple sites: Secondary | ICD-10-CM | POA: Diagnosis not present

## 2020-10-25 DIAGNOSIS — I890 Lymphedema, not elsewhere classified: Secondary | ICD-10-CM | POA: Diagnosis not present

## 2020-10-25 DIAGNOSIS — E785 Hyperlipidemia, unspecified: Secondary | ICD-10-CM | POA: Diagnosis not present

## 2020-10-25 NOTE — Telephone Encounter (Signed)
Ok for verbal 

## 2020-10-25 NOTE — Telephone Encounter (Signed)
Verbals given to nancy

## 2020-10-26 ENCOUNTER — Encounter: Payer: Self-pay | Admitting: Internal Medicine

## 2020-10-26 DIAGNOSIS — M25561 Pain in right knee: Secondary | ICD-10-CM | POA: Insufficient documentation

## 2020-10-26 NOTE — Assessment & Plan Note (Signed)
Mild, likely djd related, for topical voltaren gel prn, and to ortho f/u if not improved or worsens with giveaways or falls

## 2020-10-26 NOTE — Assessment & Plan Note (Signed)
Due for pulm f/u, cont same tx, will refer

## 2020-10-26 NOTE — Assessment & Plan Note (Signed)
BP Readings from Last 3 Encounters:  10/23/20 134/76  10/02/20 130/70  09/01/20 (!) 155/67   Stable, pt to continue medical treatment  - coreg, catapress, cardizem, lasix,   Current Outpatient Medications (Endocrine & Metabolic):  .  glipiZIDE (GLUCOTROL XL) 5 MG 24 hr tablet, TAKE 1 TABLET BY MOUTH EVERY DAY WITH BREAKFAST (Patient taking differently: Take 5 mg by mouth daily with breakfast.) .  metFORMIN (GLUCOPHAGE) 500 MG tablet, Take 2 tablets (1,000 mg total) by mouth 2 (two) times daily with a meal.  Current Outpatient Medications (Cardiovascular):  .  atorvastatin (LIPITOR) 10 MG tablet, TAKE 1 TABLET (10 MG TOTAL) BY MOUTH DAILY. .  carvedilol (COREG) 12.5 MG tablet, Take 1 tablet (12.5 mg total) by mouth 2 (two) times daily with a meal. .  cloNIDine (CATAPRES) 0.2 MG tablet, Take 1 tablet (0.2 mg total) by mouth 2 (two) times daily. Marland Kitchen  diltiazem (CARDIZEM CD) 180 MG 24 hr capsule, Take 1 capsule (180 mg total) by mouth daily. .  furosemide (LASIX) 80 MG tablet, Take 1 tablet (80 mg total) by mouth daily.   Current Outpatient Medications (Analgesics):  .  allopurinol (ZYLOPRIM) 100 MG tablet, Take 1 tablet (100 mg total) by mouth daily.  Current Outpatient Medications (Hematological):  .  vitamin B-12 (CYANOCOBALAMIN) 1000 MCG tablet, Take 1 tablet (1,000 mcg total) by mouth daily. Marland Kitchen  apixaban (ELIQUIS) 5 MG TABS tablet, Take 1 tablet (5 mg total) by mouth 2 (two) times daily.  Current Outpatient Medications (Other):  Marland Kitchen  Cholecalciferol (THERA-D 2000) 50 MCG (2000 UT) TABS, 1 tab by mouth once daily .  meclizine (ANTIVERT) 25 MG tablet, Take 25 mg by mouth every 6 (six) hours as needed for dizziness. .  potassium chloride SA (KLOR-CON M20) 20 MEQ tablet, Take 1 tablet (20 mEq total) by mouth daily.

## 2020-10-26 NOTE — Assessment & Plan Note (Signed)
Lab Results  Component Value Date   CREATININE 1.66 (H) 10/02/2020   Stable overall, cont to avoid nephrotoxins

## 2020-10-28 ENCOUNTER — Telehealth: Payer: Self-pay

## 2020-10-28 NOTE — Telephone Encounter (Signed)
[place] Name: [place] Name:[facility] Callback Phone #: [phone] Service Requested: PT Frequency of Visits: [phone]

## 2020-10-28 NOTE — Telephone Encounter (Signed)
Ok for verbals 

## 2020-10-28 NOTE — Telephone Encounter (Signed)
Verbals given to kat

## 2020-10-29 NOTE — Telephone Encounter (Signed)
[name] w/ [facility] called and wanted to let Dr. Jenny Reichmann know that the patient missed a visit today. She said that she is wanting on his ABI study. Please advise

## 2020-10-30 DIAGNOSIS — G4733 Obstructive sleep apnea (adult) (pediatric): Secondary | ICD-10-CM | POA: Diagnosis not present

## 2020-10-30 DIAGNOSIS — N183 Chronic kidney disease, stage 3 unspecified: Secondary | ICD-10-CM | POA: Diagnosis not present

## 2020-10-30 DIAGNOSIS — E785 Hyperlipidemia, unspecified: Secondary | ICD-10-CM | POA: Diagnosis not present

## 2020-10-30 DIAGNOSIS — I130 Hypertensive heart and chronic kidney disease with heart failure and stage 1 through stage 4 chronic kidney disease, or unspecified chronic kidney disease: Secondary | ICD-10-CM | POA: Diagnosis not present

## 2020-10-30 DIAGNOSIS — T560X1D Toxic effect of lead and its compounds, accidental (unintentional), subsequent encounter: Secondary | ICD-10-CM | POA: Diagnosis not present

## 2020-10-30 DIAGNOSIS — E1122 Type 2 diabetes mellitus with diabetic chronic kidney disease: Secondary | ICD-10-CM | POA: Diagnosis not present

## 2020-10-30 DIAGNOSIS — M1019 Lead-induced gout, multiple sites: Secondary | ICD-10-CM | POA: Diagnosis not present

## 2020-10-30 DIAGNOSIS — I890 Lymphedema, not elsewhere classified: Secondary | ICD-10-CM | POA: Diagnosis not present

## 2020-10-30 DIAGNOSIS — I509 Heart failure, unspecified: Secondary | ICD-10-CM | POA: Diagnosis not present

## 2020-11-04 DIAGNOSIS — I130 Hypertensive heart and chronic kidney disease with heart failure and stage 1 through stage 4 chronic kidney disease, or unspecified chronic kidney disease: Secondary | ICD-10-CM | POA: Diagnosis not present

## 2020-11-04 DIAGNOSIS — N183 Chronic kidney disease, stage 3 unspecified: Secondary | ICD-10-CM | POA: Diagnosis not present

## 2020-11-04 DIAGNOSIS — G4733 Obstructive sleep apnea (adult) (pediatric): Secondary | ICD-10-CM | POA: Diagnosis not present

## 2020-11-04 DIAGNOSIS — E1122 Type 2 diabetes mellitus with diabetic chronic kidney disease: Secondary | ICD-10-CM | POA: Diagnosis not present

## 2020-11-04 DIAGNOSIS — E785 Hyperlipidemia, unspecified: Secondary | ICD-10-CM | POA: Diagnosis not present

## 2020-11-04 DIAGNOSIS — M1019 Lead-induced gout, multiple sites: Secondary | ICD-10-CM | POA: Diagnosis not present

## 2020-11-04 DIAGNOSIS — I890 Lymphedema, not elsewhere classified: Secondary | ICD-10-CM | POA: Diagnosis not present

## 2020-11-04 DIAGNOSIS — I509 Heart failure, unspecified: Secondary | ICD-10-CM | POA: Diagnosis not present

## 2020-11-04 DIAGNOSIS — T560X1D Toxic effect of lead and its compounds, accidental (unintentional), subsequent encounter: Secondary | ICD-10-CM | POA: Diagnosis not present

## 2020-11-06 DIAGNOSIS — N183 Chronic kidney disease, stage 3 unspecified: Secondary | ICD-10-CM | POA: Diagnosis not present

## 2020-11-06 DIAGNOSIS — T560X1D Toxic effect of lead and its compounds, accidental (unintentional), subsequent encounter: Secondary | ICD-10-CM | POA: Diagnosis not present

## 2020-11-06 DIAGNOSIS — I890 Lymphedema, not elsewhere classified: Secondary | ICD-10-CM | POA: Diagnosis not present

## 2020-11-06 DIAGNOSIS — G4733 Obstructive sleep apnea (adult) (pediatric): Secondary | ICD-10-CM | POA: Diagnosis not present

## 2020-11-06 DIAGNOSIS — M1019 Lead-induced gout, multiple sites: Secondary | ICD-10-CM | POA: Diagnosis not present

## 2020-11-06 DIAGNOSIS — E1122 Type 2 diabetes mellitus with diabetic chronic kidney disease: Secondary | ICD-10-CM | POA: Diagnosis not present

## 2020-11-06 DIAGNOSIS — E785 Hyperlipidemia, unspecified: Secondary | ICD-10-CM | POA: Diagnosis not present

## 2020-11-06 DIAGNOSIS — Z7984 Long term (current) use of oral hypoglycemic drugs: Secondary | ICD-10-CM

## 2020-11-06 DIAGNOSIS — I4892 Unspecified atrial flutter: Secondary | ICD-10-CM

## 2020-11-06 DIAGNOSIS — I130 Hypertensive heart and chronic kidney disease with heart failure and stage 1 through stage 4 chronic kidney disease, or unspecified chronic kidney disease: Secondary | ICD-10-CM | POA: Diagnosis not present

## 2020-11-06 DIAGNOSIS — I509 Heart failure, unspecified: Secondary | ICD-10-CM | POA: Diagnosis not present

## 2020-11-06 DIAGNOSIS — Z7901 Long term (current) use of anticoagulants: Secondary | ICD-10-CM

## 2020-11-08 DIAGNOSIS — M1A0710 Idiopathic chronic gout, right ankle and foot, without tophus (tophi): Secondary | ICD-10-CM | POA: Diagnosis not present

## 2020-11-08 DIAGNOSIS — I4892 Unspecified atrial flutter: Secondary | ICD-10-CM | POA: Diagnosis not present

## 2020-11-08 DIAGNOSIS — I5032 Chronic diastolic (congestive) heart failure: Secondary | ICD-10-CM | POA: Diagnosis not present

## 2020-11-08 DIAGNOSIS — M6281 Muscle weakness (generalized): Secondary | ICD-10-CM | POA: Diagnosis not present

## 2020-11-08 DIAGNOSIS — L03116 Cellulitis of left lower limb: Secondary | ICD-10-CM | POA: Diagnosis not present

## 2020-11-11 ENCOUNTER — Telehealth: Payer: Self-pay | Admitting: Internal Medicine

## 2020-11-11 DIAGNOSIS — T560X1D Toxic effect of lead and its compounds, accidental (unintentional), subsequent encounter: Secondary | ICD-10-CM | POA: Diagnosis not present

## 2020-11-11 DIAGNOSIS — I130 Hypertensive heart and chronic kidney disease with heart failure and stage 1 through stage 4 chronic kidney disease, or unspecified chronic kidney disease: Secondary | ICD-10-CM | POA: Diagnosis not present

## 2020-11-11 DIAGNOSIS — N183 Chronic kidney disease, stage 3 unspecified: Secondary | ICD-10-CM | POA: Diagnosis not present

## 2020-11-11 DIAGNOSIS — E1122 Type 2 diabetes mellitus with diabetic chronic kidney disease: Secondary | ICD-10-CM | POA: Diagnosis not present

## 2020-11-11 DIAGNOSIS — G4733 Obstructive sleep apnea (adult) (pediatric): Secondary | ICD-10-CM | POA: Diagnosis not present

## 2020-11-11 DIAGNOSIS — I509 Heart failure, unspecified: Secondary | ICD-10-CM | POA: Diagnosis not present

## 2020-11-11 DIAGNOSIS — M1019 Lead-induced gout, multiple sites: Secondary | ICD-10-CM | POA: Diagnosis not present

## 2020-11-11 DIAGNOSIS — I890 Lymphedema, not elsewhere classified: Secondary | ICD-10-CM | POA: Diagnosis not present

## 2020-11-11 DIAGNOSIS — E785 Hyperlipidemia, unspecified: Secondary | ICD-10-CM | POA: Diagnosis not present

## 2020-11-11 NOTE — Telephone Encounter (Signed)
[facility] w/ [facility] called and is wanting to report a high BP reading of 164/84, she said that the patient has been taking medication every morning and eating low sodium foods. She also said that the patient is asymptomatic. Please advise

## 2020-11-11 NOTE — Telephone Encounter (Signed)
He should be seen for a BP check

## 2020-11-11 NOTE — Telephone Encounter (Signed)
Patient advised to come into the office for a blood pressure check but patient declined until next appt on 04/28

## 2020-11-12 ENCOUNTER — Ambulatory Visit (INDEPENDENT_AMBULATORY_CARE_PROVIDER_SITE_OTHER): Payer: Medicare HMO | Admitting: Orthopaedic Surgery

## 2020-11-12 ENCOUNTER — Ambulatory Visit (INDEPENDENT_AMBULATORY_CARE_PROVIDER_SITE_OTHER): Payer: Medicare HMO

## 2020-11-12 ENCOUNTER — Ambulatory Visit: Payer: Self-pay

## 2020-11-12 ENCOUNTER — Other Ambulatory Visit: Payer: Self-pay

## 2020-11-12 ENCOUNTER — Encounter: Payer: Self-pay | Admitting: Orthopaedic Surgery

## 2020-11-12 VITALS — Ht 71.5 in | Wt 367.8 lb

## 2020-11-12 DIAGNOSIS — G8929 Other chronic pain: Secondary | ICD-10-CM

## 2020-11-12 DIAGNOSIS — Z6841 Body Mass Index (BMI) 40.0 and over, adult: Secondary | ICD-10-CM | POA: Diagnosis not present

## 2020-11-12 DIAGNOSIS — M25562 Pain in left knee: Secondary | ICD-10-CM | POA: Diagnosis not present

## 2020-11-12 DIAGNOSIS — M25561 Pain in right knee: Secondary | ICD-10-CM | POA: Diagnosis not present

## 2020-11-12 MED ORDER — BUPIVACAINE HCL 0.5 % IJ SOLN
2.0000 mL | INTRAMUSCULAR | Status: AC | PRN
  Administered 2020-11-12: 2 mL via INTRA_ARTICULAR

## 2020-11-12 MED ORDER — LIDOCAINE HCL 1 % IJ SOLN
2.0000 mL | INTRAMUSCULAR | Status: AC | PRN
  Administered 2020-11-12: 2 mL

## 2020-11-12 MED ORDER — METHYLPREDNISOLONE ACETATE 40 MG/ML IJ SUSP
40.0000 mg | INTRAMUSCULAR | Status: AC | PRN
  Administered 2020-11-12: 40 mg via INTRA_ARTICULAR

## 2020-11-12 NOTE — Progress Notes (Signed)
Office Visit Note   Patient: Gerald Day           Date of Birth: 1956-02-16           MRN: [id number] Visit Date: 11/12/2020              Requested by: Biagio Borg, MD [address] [place],  [place] [address] PCP: Biagio Borg, MD   Assessment & Plan: Visit Diagnoses:  1. Chronic pain of both knees     Plan: Impression is bilateral knee DJD end-stage.  Patient is also morbidly obese and given recent hospitalization deconditioning he would benefit from physical therapy and strengthening as well as weight loss.  Bilateral knee cortisone injections administered today.  We will see him back as needed.  The patient meets the AMA guidelines for Morbid (severe) obesity with a BMI > 40.0 and I have recommended weight loss.  Follow-Up Instructions: Return if symptoms worsen or fail to improve.   Orders:  Orders Placed This Encounter  Procedures  . XR KNEE 3 VIEW RIGHT  . XR KNEE 3 VIEW LEFT   No orders of the defined types were placed in this encounter.     Procedures: Large Joint Inj: bilateral knee on 11/12/2020 3:09 PM Indications: pain Details: 22 G needle  Arthrogram: No  Medications (Right): 2 mL lidocaine 1 %; 2 mL bupivacaine 0.5 %; 40 mg methylPREDNISolone acetate 40 MG/ML Medications (Left): 2 mL lidocaine 1 %; 2 mL bupivacaine 0.5 %; 40 mg methylPREDNISolone acetate 40 MG/ML Outcome: tolerated well, no immediate complications Patient was prepped and draped in the usual sterile fashion.       Clinical Data: No additional findings.   Subjective: Chief Complaint  Patient presents with  . Right Knee - Pain  . Left Knee - Pain    Gerald Day is a 65 year old gentleman who I have seen in the past for chronic knee pain due to DJD who comes in for recent worsening.  He was hospitalized recently and spent 2 weeks there and then was transferred to a rehab facility for another couple months.  He has now just started to walk some more and he is having a lot  more pain in his knees.  He is ambulating with a rolling walker.   Review of Systems  Constitutional: Negative.   All other systems reviewed and are negative.    Objective: Vital Signs: Ht 5' 11.5" (1.816 m)   Wt (!) 367 lb 12.8 oz (166.8 kg)   BMI 50.58 kg/m   Physical Exam Vitals and nursing note reviewed.  Constitutional:      Appearance: He is well-developed and well-nourished.  Pulmonary:     Effort: Pulmonary effort is normal.  Abdominal:     Palpations: Abdomen is soft.  Skin:    General: Skin is warm.  Neurological:     Mental Status: He is alert and oriented to person, place, and time.  Psychiatric:        Mood and Affect: Mood and affect normal.        Behavior: Behavior normal.        Thought Content: Thought content normal.        Judgment: Judgment normal.     Ortho Exam Bilateral knees show significant patellofemoral crepitus with range of motion.  Venous stasis skin changes of the lower leg.  Collaterals and cruciates are grossly stable.  Pain with range of motion. Specialty Comments:  No specialty comments available.  Imaging: XR KNEE 3 VIEW LEFT  Result Date: 11/12/2020 Severe tricompartment degenerative joint disease with bone-on-bone medial compartment joint space narrowing.  XR KNEE 3 VIEW RIGHT  Result Date: 11/12/2020 Severe tricompartmental degenerative joint disease with bone-on-bone medial compartment joint space narrowing    PMFS History: Patient Active Problem List   Diagnosis Date Noted  . Right knee pain 10/26/2020  . Anticoagulated 08/28/2020  . Edema leg 08/28/2020  . PAF (paroxysmal atrial fibrillation) ([place])   . Sepsis due to cellulitis ([place]) 07/26/2020  . Stage 3a chronic kidney disease ([place]) 07/04/2020  . Vitamin D deficiency 12/28/2019  . B12 deficiency 12/28/2019  . Glaucoma 12/22/2018  . Radiculopathy, lumbar region 01/17/2018  . Constipation 06/18/2017  . AKI (acute kidney injury) ([place]) 06/02/2017  . Chronic  midline low back pain without sciatica 05/14/2017  . Proteinuria 11/22/2016  . Skin lesion 11/12/2015  . Preop exam for internal medicine 01/16/2015  . Cellulitis and abscess of leg 10/26/2013  . DM foot ulcer ([place]) 10/23/2013  . Encounter for well adult exam with abnormal findings 07/05/2011  . History of colonic polyps 09/05/2009  . COLONIC POLYPS 03/14/2008  . DIVERTICULOSIS, COLON 03/14/2008  . Hyperlipidemia 06/27/2007  . CELLULITIS/ABSCESS, LEG 06/27/2007  . Diabetes mellitus type 2 in obese ([place]) 05/21/2007  . GOUT NOS 05/21/2007  . Morbid obesity ([place]) 05/21/2007  . ERECTILE DYSFUNCTION 05/21/2007  . OSA (obstructive sleep apnea) 05/21/2007  . Essential hypertension 05/21/2007   Past Medical History:  Diagnosis Date  . Arthritis    IN KNEES  . Cellulitis 10/26/2013   LOWER RT EXTREMITY  . CELLULITIS/ABSCESS, LEG 06/27/2007  . COLONIC POLYPS   . CONGESTIVE HEART FAILURE   . DIABETES MELLITUS, TYPE II   . DIVERTICULOSIS, COLON   . ERECTILE DYSFUNCTION, ORGANIC   . GOUT NOS   . Headache   . HYPERLIPIDEMIA   . HYPERTENSION   . LOW BACK PAIN   . Morbid obesity ([place])   . SLEEP APNEA, OBSTRUCTIVE    USES CPAP    Family History  Problem Relation Age of Onset  . Cardiomyopathy Father   . Asthma Other     Past Surgical History:  Procedure Laterality Date  . APPENDECTOMY    . CARDIOVERSION N/A 08/02/2020   Procedure: CARDIOVERSION;  Surgeon: Sanda Klein, MD;  [facility];  Service: Cardiovascular;  Laterality: N/A;  . FOOT SURGERY     LT  . TEE WITHOUT CARDIOVERSION N/A 08/02/2020   Procedure: TRANSESOPHAGEAL ECHOCARDIOGRAM (TEE);  Surgeon: Sanda Klein, MD;  Location: [facility];  Service: Cardiovascular;  Laterality: N/A;  . TENOTOMY / FLEXOR TENDON TRANSFER Right 03/15/2015   Procedure: TENOTOMY HT REPAIR/ULCER DEBRIDEMENT/GRAFT PREP/ACELL GRAFT APPLICATION;  Surgeon: Jana Half, DPM;  Location: [facility];  Service: Podiatry;  Laterality:  Right;  HALLUX   Social History   Occupational History  . Occupation: retired  Tobacco Use  . Smoking status: Never Smoker  . Smokeless tobacco: Never Used  Vaping Use  . Vaping Use: Never used  Substance and Sexual Activity  . Alcohol use: Not Currently    Alcohol/week: 3.0 - 4.0 standard drinks    Types: 3 - 4 Cans of beer per week    Comment: hardly any; h/o heavy use  . Drug use: No  . Sexual activity: Not on file

## 2020-11-13 DIAGNOSIS — E1122 Type 2 diabetes mellitus with diabetic chronic kidney disease: Secondary | ICD-10-CM | POA: Diagnosis not present

## 2020-11-13 DIAGNOSIS — I509 Heart failure, unspecified: Secondary | ICD-10-CM | POA: Diagnosis not present

## 2020-11-13 DIAGNOSIS — I890 Lymphedema, not elsewhere classified: Secondary | ICD-10-CM | POA: Diagnosis not present

## 2020-11-13 DIAGNOSIS — I130 Hypertensive heart and chronic kidney disease with heart failure and stage 1 through stage 4 chronic kidney disease, or unspecified chronic kidney disease: Secondary | ICD-10-CM | POA: Diagnosis not present

## 2020-11-13 DIAGNOSIS — M1019 Lead-induced gout, multiple sites: Secondary | ICD-10-CM | POA: Diagnosis not present

## 2020-11-13 DIAGNOSIS — N183 Chronic kidney disease, stage 3 unspecified: Secondary | ICD-10-CM | POA: Diagnosis not present

## 2020-11-13 DIAGNOSIS — T560X1D Toxic effect of lead and its compounds, accidental (unintentional), subsequent encounter: Secondary | ICD-10-CM | POA: Diagnosis not present

## 2020-11-13 DIAGNOSIS — E785 Hyperlipidemia, unspecified: Secondary | ICD-10-CM | POA: Diagnosis not present

## 2020-11-13 DIAGNOSIS — G4733 Obstructive sleep apnea (adult) (pediatric): Secondary | ICD-10-CM | POA: Diagnosis not present

## 2020-11-16 DIAGNOSIS — G4733 Obstructive sleep apnea (adult) (pediatric): Secondary | ICD-10-CM | POA: Diagnosis not present

## 2020-11-16 DIAGNOSIS — M1019 Lead-induced gout, multiple sites: Secondary | ICD-10-CM | POA: Diagnosis not present

## 2020-11-16 DIAGNOSIS — N183 Chronic kidney disease, stage 3 unspecified: Secondary | ICD-10-CM | POA: Diagnosis not present

## 2020-11-16 DIAGNOSIS — T560X1D Toxic effect of lead and its compounds, accidental (unintentional), subsequent encounter: Secondary | ICD-10-CM | POA: Diagnosis not present

## 2020-11-16 DIAGNOSIS — I130 Hypertensive heart and chronic kidney disease with heart failure and stage 1 through stage 4 chronic kidney disease, or unspecified chronic kidney disease: Secondary | ICD-10-CM | POA: Diagnosis not present

## 2020-11-16 DIAGNOSIS — E1122 Type 2 diabetes mellitus with diabetic chronic kidney disease: Secondary | ICD-10-CM | POA: Diagnosis not present

## 2020-11-16 DIAGNOSIS — I509 Heart failure, unspecified: Secondary | ICD-10-CM | POA: Diagnosis not present

## 2020-11-16 DIAGNOSIS — I890 Lymphedema, not elsewhere classified: Secondary | ICD-10-CM | POA: Diagnosis not present

## 2020-11-16 DIAGNOSIS — E785 Hyperlipidemia, unspecified: Secondary | ICD-10-CM | POA: Diagnosis not present

## 2020-11-18 DIAGNOSIS — N183 Chronic kidney disease, stage 3 unspecified: Secondary | ICD-10-CM | POA: Diagnosis not present

## 2020-11-18 DIAGNOSIS — I509 Heart failure, unspecified: Secondary | ICD-10-CM | POA: Diagnosis not present

## 2020-11-18 DIAGNOSIS — I130 Hypertensive heart and chronic kidney disease with heart failure and stage 1 through stage 4 chronic kidney disease, or unspecified chronic kidney disease: Secondary | ICD-10-CM | POA: Diagnosis not present

## 2020-11-18 DIAGNOSIS — E785 Hyperlipidemia, unspecified: Secondary | ICD-10-CM | POA: Diagnosis not present

## 2020-11-18 DIAGNOSIS — I890 Lymphedema, not elsewhere classified: Secondary | ICD-10-CM | POA: Diagnosis not present

## 2020-11-18 DIAGNOSIS — T560X1D Toxic effect of lead and its compounds, accidental (unintentional), subsequent encounter: Secondary | ICD-10-CM | POA: Diagnosis not present

## 2020-11-18 DIAGNOSIS — E1122 Type 2 diabetes mellitus with diabetic chronic kidney disease: Secondary | ICD-10-CM | POA: Diagnosis not present

## 2020-11-18 DIAGNOSIS — M1019 Lead-induced gout, multiple sites: Secondary | ICD-10-CM | POA: Diagnosis not present

## 2020-11-18 DIAGNOSIS — G4733 Obstructive sleep apnea (adult) (pediatric): Secondary | ICD-10-CM | POA: Diagnosis not present

## 2020-11-19 DIAGNOSIS — T560X1D Toxic effect of lead and its compounds, accidental (unintentional), subsequent encounter: Secondary | ICD-10-CM | POA: Diagnosis not present

## 2020-11-19 DIAGNOSIS — E1122 Type 2 diabetes mellitus with diabetic chronic kidney disease: Secondary | ICD-10-CM | POA: Diagnosis not present

## 2020-11-19 DIAGNOSIS — G4733 Obstructive sleep apnea (adult) (pediatric): Secondary | ICD-10-CM | POA: Diagnosis not present

## 2020-11-19 DIAGNOSIS — I890 Lymphedema, not elsewhere classified: Secondary | ICD-10-CM | POA: Diagnosis not present

## 2020-11-19 DIAGNOSIS — E785 Hyperlipidemia, unspecified: Secondary | ICD-10-CM | POA: Diagnosis not present

## 2020-11-19 DIAGNOSIS — M1019 Lead-induced gout, multiple sites: Secondary | ICD-10-CM | POA: Diagnosis not present

## 2020-11-19 DIAGNOSIS — I509 Heart failure, unspecified: Secondary | ICD-10-CM | POA: Diagnosis not present

## 2020-11-19 DIAGNOSIS — I130 Hypertensive heart and chronic kidney disease with heart failure and stage 1 through stage 4 chronic kidney disease, or unspecified chronic kidney disease: Secondary | ICD-10-CM | POA: Diagnosis not present

## 2020-11-19 DIAGNOSIS — N183 Chronic kidney disease, stage 3 unspecified: Secondary | ICD-10-CM | POA: Diagnosis not present

## 2020-11-25 DIAGNOSIS — I890 Lymphedema, not elsewhere classified: Secondary | ICD-10-CM | POA: Diagnosis not present

## 2020-11-25 DIAGNOSIS — I509 Heart failure, unspecified: Secondary | ICD-10-CM | POA: Diagnosis not present

## 2020-11-25 DIAGNOSIS — M1019 Lead-induced gout, multiple sites: Secondary | ICD-10-CM | POA: Diagnosis not present

## 2020-11-25 DIAGNOSIS — N183 Chronic kidney disease, stage 3 unspecified: Secondary | ICD-10-CM | POA: Diagnosis not present

## 2020-11-25 DIAGNOSIS — T560X1D Toxic effect of lead and its compounds, accidental (unintentional), subsequent encounter: Secondary | ICD-10-CM | POA: Diagnosis not present

## 2020-11-25 DIAGNOSIS — E785 Hyperlipidemia, unspecified: Secondary | ICD-10-CM | POA: Diagnosis not present

## 2020-11-25 DIAGNOSIS — I130 Hypertensive heart and chronic kidney disease with heart failure and stage 1 through stage 4 chronic kidney disease, or unspecified chronic kidney disease: Secondary | ICD-10-CM | POA: Diagnosis not present

## 2020-11-25 DIAGNOSIS — G4733 Obstructive sleep apnea (adult) (pediatric): Secondary | ICD-10-CM | POA: Diagnosis not present

## 2020-11-25 DIAGNOSIS — E1122 Type 2 diabetes mellitus with diabetic chronic kidney disease: Secondary | ICD-10-CM | POA: Diagnosis not present

## 2020-11-27 DIAGNOSIS — L97509 Non-pressure chronic ulcer of other part of unspecified foot with unspecified severity: Secondary | ICD-10-CM | POA: Diagnosis not present

## 2020-11-27 DIAGNOSIS — T560X1D Toxic effect of lead and its compounds, accidental (unintentional), subsequent encounter: Secondary | ICD-10-CM | POA: Diagnosis not present

## 2020-11-27 DIAGNOSIS — E785 Hyperlipidemia, unspecified: Secondary | ICD-10-CM | POA: Diagnosis not present

## 2020-11-27 DIAGNOSIS — M1019 Lead-induced gout, multiple sites: Secondary | ICD-10-CM | POA: Diagnosis not present

## 2020-11-27 DIAGNOSIS — I890 Lymphedema, not elsewhere classified: Secondary | ICD-10-CM | POA: Diagnosis not present

## 2020-11-27 DIAGNOSIS — I130 Hypertensive heart and chronic kidney disease with heart failure and stage 1 through stage 4 chronic kidney disease, or unspecified chronic kidney disease: Secondary | ICD-10-CM | POA: Diagnosis not present

## 2020-11-27 DIAGNOSIS — N183 Chronic kidney disease, stage 3 unspecified: Secondary | ICD-10-CM | POA: Diagnosis not present

## 2020-11-27 DIAGNOSIS — E1122 Type 2 diabetes mellitus with diabetic chronic kidney disease: Secondary | ICD-10-CM | POA: Diagnosis not present

## 2020-11-27 DIAGNOSIS — G4733 Obstructive sleep apnea (adult) (pediatric): Secondary | ICD-10-CM | POA: Diagnosis not present

## 2020-11-27 DIAGNOSIS — I509 Heart failure, unspecified: Secondary | ICD-10-CM | POA: Diagnosis not present

## 2020-12-02 DIAGNOSIS — I890 Lymphedema, not elsewhere classified: Secondary | ICD-10-CM | POA: Diagnosis not present

## 2020-12-02 DIAGNOSIS — I509 Heart failure, unspecified: Secondary | ICD-10-CM | POA: Diagnosis not present

## 2020-12-02 DIAGNOSIS — E1122 Type 2 diabetes mellitus with diabetic chronic kidney disease: Secondary | ICD-10-CM | POA: Diagnosis not present

## 2020-12-02 DIAGNOSIS — E785 Hyperlipidemia, unspecified: Secondary | ICD-10-CM | POA: Diagnosis not present

## 2020-12-02 DIAGNOSIS — G4733 Obstructive sleep apnea (adult) (pediatric): Secondary | ICD-10-CM | POA: Diagnosis not present

## 2020-12-02 DIAGNOSIS — N183 Chronic kidney disease, stage 3 unspecified: Secondary | ICD-10-CM | POA: Diagnosis not present

## 2020-12-02 DIAGNOSIS — I130 Hypertensive heart and chronic kidney disease with heart failure and stage 1 through stage 4 chronic kidney disease, or unspecified chronic kidney disease: Secondary | ICD-10-CM | POA: Diagnosis not present

## 2020-12-02 DIAGNOSIS — M1019 Lead-induced gout, multiple sites: Secondary | ICD-10-CM | POA: Diagnosis not present

## 2020-12-02 DIAGNOSIS — T560X1D Toxic effect of lead and its compounds, accidental (unintentional), subsequent encounter: Secondary | ICD-10-CM | POA: Diagnosis not present

## 2020-12-04 ENCOUNTER — Telehealth: Payer: Self-pay | Admitting: Internal Medicine

## 2020-12-04 DIAGNOSIS — I509 Heart failure, unspecified: Secondary | ICD-10-CM | POA: Diagnosis not present

## 2020-12-04 DIAGNOSIS — M1019 Lead-induced gout, multiple sites: Secondary | ICD-10-CM | POA: Diagnosis not present

## 2020-12-04 DIAGNOSIS — E1122 Type 2 diabetes mellitus with diabetic chronic kidney disease: Secondary | ICD-10-CM | POA: Diagnosis not present

## 2020-12-04 DIAGNOSIS — T560X1D Toxic effect of lead and its compounds, accidental (unintentional), subsequent encounter: Secondary | ICD-10-CM | POA: Diagnosis not present

## 2020-12-04 DIAGNOSIS — N183 Chronic kidney disease, stage 3 unspecified: Secondary | ICD-10-CM | POA: Diagnosis not present

## 2020-12-04 DIAGNOSIS — I130 Hypertensive heart and chronic kidney disease with heart failure and stage 1 through stage 4 chronic kidney disease, or unspecified chronic kidney disease: Secondary | ICD-10-CM | POA: Diagnosis not present

## 2020-12-04 DIAGNOSIS — E785 Hyperlipidemia, unspecified: Secondary | ICD-10-CM | POA: Diagnosis not present

## 2020-12-04 DIAGNOSIS — G4733 Obstructive sleep apnea (adult) (pediatric): Secondary | ICD-10-CM | POA: Diagnosis not present

## 2020-12-04 DIAGNOSIS — I890 Lymphedema, not elsewhere classified: Secondary | ICD-10-CM | POA: Diagnosis not present

## 2020-12-04 NOTE — Telephone Encounter (Signed)
Gerald Day- [facility] [phone] ok to leave a message   Swelling and pain in left hand in wrist starting Tuesday- believes its gout flare   Son gave a splint on left wrist  Medicine he is taking for the pain now  allopurinol (ZYLOPRIM) 100 MG tablet  Pain still Bad; suggested Colchicine   Also, Still having edema in thigh and lower leg.. never got okay for compression therapy   Also, BP consistently high 150..140..160.. 193/102. Ranges at least 140+ daily   Currently taking these for BP carvedilol (COREG) 12.5 MG tablet(Expired) cloNIDine (CATAPRES) 0.2 MG tablet(Expired) diltiazem (CARDIZEM CD) 180 MG 24 hr capsule Telmisartan 80mg  1 daily

## 2020-12-05 MED ORDER — COLCHICINE 0.6 MG PO TABS: ORAL_TABLET | ORAL | 5 refills | Status: DC

## 2020-12-05 NOTE — Telephone Encounter (Signed)
Ok sent to [employer]

## 2020-12-09 DIAGNOSIS — I4892 Unspecified atrial flutter: Secondary | ICD-10-CM | POA: Diagnosis not present

## 2020-12-09 DIAGNOSIS — I5032 Chronic diastolic (congestive) heart failure: Secondary | ICD-10-CM | POA: Diagnosis not present

## 2020-12-09 DIAGNOSIS — M6281 Muscle weakness (generalized): Secondary | ICD-10-CM | POA: Diagnosis not present

## 2020-12-09 DIAGNOSIS — L03116 Cellulitis of left lower limb: Secondary | ICD-10-CM | POA: Diagnosis not present

## 2020-12-09 DIAGNOSIS — M1A0710 Idiopathic chronic gout, right ankle and foot, without tophus (tophi): Secondary | ICD-10-CM | POA: Diagnosis not present

## 2020-12-11 ENCOUNTER — Telehealth: Payer: Self-pay | Admitting: Internal Medicine

## 2020-12-11 DIAGNOSIS — G4733 Obstructive sleep apnea (adult) (pediatric): Secondary | ICD-10-CM | POA: Diagnosis not present

## 2020-12-11 DIAGNOSIS — E785 Hyperlipidemia, unspecified: Secondary | ICD-10-CM | POA: Diagnosis not present

## 2020-12-11 DIAGNOSIS — N183 Chronic kidney disease, stage 3 unspecified: Secondary | ICD-10-CM | POA: Diagnosis not present

## 2020-12-11 DIAGNOSIS — I509 Heart failure, unspecified: Secondary | ICD-10-CM | POA: Diagnosis not present

## 2020-12-11 DIAGNOSIS — T560X1D Toxic effect of lead and its compounds, accidental (unintentional), subsequent encounter: Secondary | ICD-10-CM | POA: Diagnosis not present

## 2020-12-11 DIAGNOSIS — M1019 Lead-induced gout, multiple sites: Secondary | ICD-10-CM | POA: Diagnosis not present

## 2020-12-11 DIAGNOSIS — E1122 Type 2 diabetes mellitus with diabetic chronic kidney disease: Secondary | ICD-10-CM | POA: Diagnosis not present

## 2020-12-11 DIAGNOSIS — I130 Hypertensive heart and chronic kidney disease with heart failure and stage 1 through stage 4 chronic kidney disease, or unspecified chronic kidney disease: Secondary | ICD-10-CM | POA: Diagnosis not present

## 2020-12-11 DIAGNOSIS — I890 Lymphedema, not elsewhere classified: Secondary | ICD-10-CM | POA: Diagnosis not present

## 2020-12-11 NOTE — Telephone Encounter (Signed)
[name] w/ [facility] is requesting verbals to continue nursing for 1w4 for CHF disease management.   Okay to LVM: [phone]

## 2020-12-11 NOTE — Telephone Encounter (Signed)
   Additional order request   [place] Name: [place] Name: [place] Phone #: [phone] Service Requested: PT Frequency of Visits: [phone]

## 2020-12-11 NOTE — Telephone Encounter (Signed)
Ok verbals 

## 2020-12-12 NOTE — Telephone Encounter (Signed)
Orders given.  

## 2020-12-16 DIAGNOSIS — G4733 Obstructive sleep apnea (adult) (pediatric): Secondary | ICD-10-CM | POA: Diagnosis not present

## 2020-12-16 DIAGNOSIS — T560X1D Toxic effect of lead and its compounds, accidental (unintentional), subsequent encounter: Secondary | ICD-10-CM | POA: Diagnosis not present

## 2020-12-16 DIAGNOSIS — E785 Hyperlipidemia, unspecified: Secondary | ICD-10-CM | POA: Diagnosis not present

## 2020-12-16 DIAGNOSIS — I5032 Chronic diastolic (congestive) heart failure: Secondary | ICD-10-CM | POA: Diagnosis not present

## 2020-12-16 DIAGNOSIS — I130 Hypertensive heart and chronic kidney disease with heart failure and stage 1 through stage 4 chronic kidney disease, or unspecified chronic kidney disease: Secondary | ICD-10-CM | POA: Diagnosis not present

## 2020-12-16 DIAGNOSIS — I890 Lymphedema, not elsewhere classified: Secondary | ICD-10-CM | POA: Diagnosis not present

## 2020-12-16 DIAGNOSIS — E1122 Type 2 diabetes mellitus with diabetic chronic kidney disease: Secondary | ICD-10-CM | POA: Diagnosis not present

## 2020-12-16 DIAGNOSIS — N183 Chronic kidney disease, stage 3 unspecified: Secondary | ICD-10-CM | POA: Diagnosis not present

## 2020-12-16 DIAGNOSIS — M1019 Lead-induced gout, multiple sites: Secondary | ICD-10-CM | POA: Diagnosis not present

## 2020-12-17 DIAGNOSIS — M1019 Lead-induced gout, multiple sites: Secondary | ICD-10-CM | POA: Diagnosis not present

## 2020-12-17 DIAGNOSIS — E1122 Type 2 diabetes mellitus with diabetic chronic kidney disease: Secondary | ICD-10-CM | POA: Diagnosis not present

## 2020-12-17 DIAGNOSIS — T560X1D Toxic effect of lead and its compounds, accidental (unintentional), subsequent encounter: Secondary | ICD-10-CM | POA: Diagnosis not present

## 2020-12-17 DIAGNOSIS — G4733 Obstructive sleep apnea (adult) (pediatric): Secondary | ICD-10-CM | POA: Diagnosis not present

## 2020-12-17 DIAGNOSIS — E785 Hyperlipidemia, unspecified: Secondary | ICD-10-CM | POA: Diagnosis not present

## 2020-12-17 DIAGNOSIS — N183 Chronic kidney disease, stage 3 unspecified: Secondary | ICD-10-CM | POA: Diagnosis not present

## 2020-12-17 DIAGNOSIS — I130 Hypertensive heart and chronic kidney disease with heart failure and stage 1 through stage 4 chronic kidney disease, or unspecified chronic kidney disease: Secondary | ICD-10-CM | POA: Diagnosis not present

## 2020-12-17 DIAGNOSIS — I890 Lymphedema, not elsewhere classified: Secondary | ICD-10-CM | POA: Diagnosis not present

## 2020-12-17 DIAGNOSIS — I5032 Chronic diastolic (congestive) heart failure: Secondary | ICD-10-CM | POA: Diagnosis not present

## 2020-12-23 DIAGNOSIS — N183 Chronic kidney disease, stage 3 unspecified: Secondary | ICD-10-CM | POA: Diagnosis not present

## 2020-12-23 DIAGNOSIS — T560X1D Toxic effect of lead and its compounds, accidental (unintentional), subsequent encounter: Secondary | ICD-10-CM | POA: Diagnosis not present

## 2020-12-23 DIAGNOSIS — E785 Hyperlipidemia, unspecified: Secondary | ICD-10-CM | POA: Diagnosis not present

## 2020-12-23 DIAGNOSIS — G4733 Obstructive sleep apnea (adult) (pediatric): Secondary | ICD-10-CM | POA: Diagnosis not present

## 2020-12-23 DIAGNOSIS — I890 Lymphedema, not elsewhere classified: Secondary | ICD-10-CM | POA: Diagnosis not present

## 2020-12-23 DIAGNOSIS — I5032 Chronic diastolic (congestive) heart failure: Secondary | ICD-10-CM | POA: Diagnosis not present

## 2020-12-23 DIAGNOSIS — I130 Hypertensive heart and chronic kidney disease with heart failure and stage 1 through stage 4 chronic kidney disease, or unspecified chronic kidney disease: Secondary | ICD-10-CM | POA: Diagnosis not present

## 2020-12-23 DIAGNOSIS — M1019 Lead-induced gout, multiple sites: Secondary | ICD-10-CM | POA: Diagnosis not present

## 2020-12-23 DIAGNOSIS — E1122 Type 2 diabetes mellitus with diabetic chronic kidney disease: Secondary | ICD-10-CM | POA: Diagnosis not present

## 2020-12-24 DIAGNOSIS — I130 Hypertensive heart and chronic kidney disease with heart failure and stage 1 through stage 4 chronic kidney disease, or unspecified chronic kidney disease: Secondary | ICD-10-CM | POA: Diagnosis not present

## 2020-12-24 DIAGNOSIS — E1122 Type 2 diabetes mellitus with diabetic chronic kidney disease: Secondary | ICD-10-CM | POA: Diagnosis not present

## 2020-12-24 DIAGNOSIS — M1019 Lead-induced gout, multiple sites: Secondary | ICD-10-CM | POA: Diagnosis not present

## 2020-12-24 DIAGNOSIS — I5032 Chronic diastolic (congestive) heart failure: Secondary | ICD-10-CM | POA: Diagnosis not present

## 2020-12-24 DIAGNOSIS — G4733 Obstructive sleep apnea (adult) (pediatric): Secondary | ICD-10-CM | POA: Diagnosis not present

## 2020-12-24 DIAGNOSIS — N183 Chronic kidney disease, stage 3 unspecified: Secondary | ICD-10-CM | POA: Diagnosis not present

## 2020-12-24 DIAGNOSIS — E785 Hyperlipidemia, unspecified: Secondary | ICD-10-CM | POA: Diagnosis not present

## 2020-12-24 DIAGNOSIS — I890 Lymphedema, not elsewhere classified: Secondary | ICD-10-CM | POA: Diagnosis not present

## 2020-12-24 DIAGNOSIS — T560X1D Toxic effect of lead and its compounds, accidental (unintentional), subsequent encounter: Secondary | ICD-10-CM | POA: Diagnosis not present

## 2020-12-25 DIAGNOSIS — I130 Hypertensive heart and chronic kidney disease with heart failure and stage 1 through stage 4 chronic kidney disease, or unspecified chronic kidney disease: Secondary | ICD-10-CM | POA: Diagnosis not present

## 2020-12-25 DIAGNOSIS — G4733 Obstructive sleep apnea (adult) (pediatric): Secondary | ICD-10-CM | POA: Diagnosis not present

## 2020-12-25 DIAGNOSIS — E1122 Type 2 diabetes mellitus with diabetic chronic kidney disease: Secondary | ICD-10-CM | POA: Diagnosis not present

## 2020-12-25 DIAGNOSIS — I5032 Chronic diastolic (congestive) heart failure: Secondary | ICD-10-CM | POA: Diagnosis not present

## 2020-12-25 DIAGNOSIS — T560X1D Toxic effect of lead and its compounds, accidental (unintentional), subsequent encounter: Secondary | ICD-10-CM | POA: Diagnosis not present

## 2020-12-25 DIAGNOSIS — I890 Lymphedema, not elsewhere classified: Secondary | ICD-10-CM | POA: Diagnosis not present

## 2020-12-25 DIAGNOSIS — M1019 Lead-induced gout, multiple sites: Secondary | ICD-10-CM | POA: Diagnosis not present

## 2020-12-25 DIAGNOSIS — N183 Chronic kidney disease, stage 3 unspecified: Secondary | ICD-10-CM | POA: Diagnosis not present

## 2020-12-25 DIAGNOSIS — E785 Hyperlipidemia, unspecified: Secondary | ICD-10-CM | POA: Diagnosis not present

## 2020-12-27 ENCOUNTER — Telehealth: Payer: Self-pay | Admitting: Internal Medicine

## 2020-12-27 DIAGNOSIS — I5032 Chronic diastolic (congestive) heart failure: Secondary | ICD-10-CM | POA: Diagnosis not present

## 2020-12-27 DIAGNOSIS — E1122 Type 2 diabetes mellitus with diabetic chronic kidney disease: Secondary | ICD-10-CM | POA: Diagnosis not present

## 2020-12-27 DIAGNOSIS — E785 Hyperlipidemia, unspecified: Secondary | ICD-10-CM | POA: Diagnosis not present

## 2020-12-27 DIAGNOSIS — T560X1D Toxic effect of lead and its compounds, accidental (unintentional), subsequent encounter: Secondary | ICD-10-CM | POA: Diagnosis not present

## 2020-12-27 DIAGNOSIS — M1019 Lead-induced gout, multiple sites: Secondary | ICD-10-CM | POA: Diagnosis not present

## 2020-12-27 DIAGNOSIS — G4733 Obstructive sleep apnea (adult) (pediatric): Secondary | ICD-10-CM | POA: Diagnosis not present

## 2020-12-27 DIAGNOSIS — I890 Lymphedema, not elsewhere classified: Secondary | ICD-10-CM | POA: Diagnosis not present

## 2020-12-27 DIAGNOSIS — N183 Chronic kidney disease, stage 3 unspecified: Secondary | ICD-10-CM | POA: Diagnosis not present

## 2020-12-27 DIAGNOSIS — I130 Hypertensive heart and chronic kidney disease with heart failure and stage 1 through stage 4 chronic kidney disease, or unspecified chronic kidney disease: Secondary | ICD-10-CM | POA: Diagnosis not present

## 2020-12-27 NOTE — Telephone Encounter (Signed)
Patient called and was wondering if lab orders could be placed before his appt on 01-02-21. He can be reached at [phone]. Please advise

## 2020-12-27 NOTE — Telephone Encounter (Signed)
All labs already ordered, thanks

## 2020-12-30 NOTE — Telephone Encounter (Signed)
Patient notified

## 2020-12-31 ENCOUNTER — Other Ambulatory Visit (INDEPENDENT_AMBULATORY_CARE_PROVIDER_SITE_OTHER): Payer: Medicare HMO

## 2020-12-31 DIAGNOSIS — I5032 Chronic diastolic (congestive) heart failure: Secondary | ICD-10-CM | POA: Diagnosis not present

## 2020-12-31 DIAGNOSIS — E785 Hyperlipidemia, unspecified: Secondary | ICD-10-CM | POA: Diagnosis not present

## 2020-12-31 DIAGNOSIS — E559 Vitamin D deficiency, unspecified: Secondary | ICD-10-CM

## 2020-12-31 DIAGNOSIS — E1122 Type 2 diabetes mellitus with diabetic chronic kidney disease: Secondary | ICD-10-CM | POA: Diagnosis not present

## 2020-12-31 DIAGNOSIS — I130 Hypertensive heart and chronic kidney disease with heart failure and stage 1 through stage 4 chronic kidney disease, or unspecified chronic kidney disease: Secondary | ICD-10-CM | POA: Diagnosis not present

## 2020-12-31 DIAGNOSIS — E1165 Type 2 diabetes mellitus with hyperglycemia: Secondary | ICD-10-CM | POA: Diagnosis not present

## 2020-12-31 DIAGNOSIS — M1019 Lead-induced gout, multiple sites: Secondary | ICD-10-CM | POA: Diagnosis not present

## 2020-12-31 DIAGNOSIS — T560X1D Toxic effect of lead and its compounds, accidental (unintentional), subsequent encounter: Secondary | ICD-10-CM | POA: Diagnosis not present

## 2020-12-31 DIAGNOSIS — Z0000 Encounter for general adult medical examination without abnormal findings: Secondary | ICD-10-CM | POA: Diagnosis not present

## 2020-12-31 DIAGNOSIS — N1831 Chronic kidney disease, stage 3a: Secondary | ICD-10-CM | POA: Diagnosis not present

## 2020-12-31 DIAGNOSIS — I890 Lymphedema, not elsewhere classified: Secondary | ICD-10-CM | POA: Diagnosis not present

## 2020-12-31 DIAGNOSIS — E538 Deficiency of other specified B group vitamins: Secondary | ICD-10-CM

## 2020-12-31 DIAGNOSIS — N183 Chronic kidney disease, stage 3 unspecified: Secondary | ICD-10-CM | POA: Diagnosis not present

## 2020-12-31 DIAGNOSIS — G4733 Obstructive sleep apnea (adult) (pediatric): Secondary | ICD-10-CM | POA: Diagnosis not present

## 2020-12-31 LAB — HEPATIC FUNCTION PANEL
ALT: 14 U/L (ref 0–53)
AST: 12 U/L (ref 0–37)
Albumin: 3.7 g/dL (ref 3.5–5.2)
Alkaline Phosphatase: 91 U/L (ref 39–117)
Bilirubin, Direct: 0.1 mg/dL (ref 0.0–0.3)
Total Bilirubin: 0.5 mg/dL (ref 0.2–1.2)
Total Protein: 7.2 g/dL (ref 6.0–8.3)

## 2020-12-31 LAB — CBC WITH DIFFERENTIAL/PLATELET
Basophils Absolute: 0 10*3/uL (ref 0.0–0.1)
Basophils Relative: 0.8 % (ref 0.0–3.0)
Eosinophils Absolute: 0.4 10*3/uL (ref 0.0–0.7)
Eosinophils Relative: 8.2 % — ABNORMAL HIGH (ref 0.0–5.0)
HCT: 32.8 % — ABNORMAL LOW (ref 39.0–52.0)
Hemoglobin: 11.3 g/dL — ABNORMAL LOW (ref 13.0–17.0)
Lymphocytes Relative: 22.8 % (ref 12.0–46.0)
Lymphs Abs: 1 10*3/uL (ref 0.7–4.0)
MCHC: 34.5 g/dL (ref 30.0–36.0)
MCV: 89.3 fl (ref 78.0–100.0)
Monocytes Absolute: 0.7 10*3/uL (ref 0.1–1.0)
Monocytes Relative: 15.8 % — ABNORMAL HIGH (ref 3.0–12.0)
Neutro Abs: 2.3 10*3/uL (ref 1.4–7.7)
Neutrophils Relative %: 52.4 % (ref 43.0–77.0)
Platelets: 186 10*3/uL (ref 150.0–400.0)
RBC: 3.67 Mil/uL — ABNORMAL LOW (ref 4.22–5.81)
RDW: 14.4 % (ref 11.5–15.5)
WBC: 4.5 10*3/uL (ref 4.0–10.5)

## 2020-12-31 LAB — MICROALBUMIN / CREATININE URINE RATIO
Creatinine,U: 60.5 mg/dL
Microalb Creat Ratio: 263.9 mg/g — ABNORMAL HIGH (ref 0.0–30.0)
Microalb, Ur: 159.7 mg/dL — ABNORMAL HIGH (ref 0.0–1.9)

## 2020-12-31 LAB — URINALYSIS, ROUTINE W REFLEX MICROSCOPIC
Bilirubin Urine: NEGATIVE
Ketones, ur: NEGATIVE
Nitrite: NEGATIVE
Specific Gravity, Urine: 1.02 (ref 1.000–1.030)
Total Protein, Urine: 100 — AB
Urine Glucose: NEGATIVE
Urobilinogen, UA: 0.2 (ref 0.0–1.0)
pH: 5.5 (ref 5.0–8.0)

## 2020-12-31 LAB — TSH: TSH: 4.3 u[IU]/mL (ref 0.35–4.50)

## 2020-12-31 LAB — BASIC METABOLIC PANEL
BUN: 32 mg/dL — ABNORMAL HIGH (ref 6–23)
CO2: 24 mEq/L (ref 19–32)
Calcium: 9.3 mg/dL (ref 8.4–10.5)
Chloride: 104 mEq/L (ref 96–112)
Creatinine, Ser: 1.54 mg/dL — ABNORMAL HIGH (ref 0.40–1.50)
GFR: 46.87 mL/min — ABNORMAL LOW (ref >60.00)
Glucose, Bld: 147 mg/dL — ABNORMAL HIGH (ref 70–99)
Potassium: 4.5 mEq/L (ref 3.5–5.1)
Sodium: 138 mEq/L (ref 135–145)

## 2020-12-31 LAB — HEMOGLOBIN A1C: Hgb A1c MFr Bld: 6.3 % (ref 4.6–6.5)

## 2020-12-31 LAB — LIPID PANEL
Cholesterol: 121 mg/dL (ref 0–200)
HDL: 35 mg/dL — ABNORMAL LOW (ref >39.00)
LDL Cholesterol: 50 mg/dL (ref 0–99)
NonHDL: 85.9
Total CHOL/HDL Ratio: 3
Triglycerides: 179 mg/dL — ABNORMAL HIGH (ref 0.0–149.0)
VLDL: 35.8 mg/dL (ref 0.0–40.0)

## 2020-12-31 LAB — VITAMIN D 25 HYDROXY (VIT D DEFICIENCY, FRACTURES): VITD: 27.94 ng/mL — ABNORMAL LOW (ref 30.00–100.00)

## 2020-12-31 LAB — PSA: PSA: 0.35 ng/mL (ref 0.10–4.00)

## 2020-12-31 LAB — PHOSPHORUS: Phosphorus: 4.1 mg/dL (ref 2.3–4.6)

## 2020-12-31 LAB — VITAMIN B12: Vitamin B-12: 531 pg/mL (ref 211–911)

## 2021-01-01 DIAGNOSIS — G4733 Obstructive sleep apnea (adult) (pediatric): Secondary | ICD-10-CM | POA: Diagnosis not present

## 2021-01-01 DIAGNOSIS — N183 Chronic kidney disease, stage 3 unspecified: Secondary | ICD-10-CM | POA: Diagnosis not present

## 2021-01-01 DIAGNOSIS — M1019 Lead-induced gout, multiple sites: Secondary | ICD-10-CM | POA: Diagnosis not present

## 2021-01-01 DIAGNOSIS — E1122 Type 2 diabetes mellitus with diabetic chronic kidney disease: Secondary | ICD-10-CM | POA: Diagnosis not present

## 2021-01-01 DIAGNOSIS — I5032 Chronic diastolic (congestive) heart failure: Secondary | ICD-10-CM | POA: Diagnosis not present

## 2021-01-01 DIAGNOSIS — I890 Lymphedema, not elsewhere classified: Secondary | ICD-10-CM | POA: Diagnosis not present

## 2021-01-01 DIAGNOSIS — T560X1D Toxic effect of lead and its compounds, accidental (unintentional), subsequent encounter: Secondary | ICD-10-CM | POA: Diagnosis not present

## 2021-01-01 DIAGNOSIS — I130 Hypertensive heart and chronic kidney disease with heart failure and stage 1 through stage 4 chronic kidney disease, or unspecified chronic kidney disease: Secondary | ICD-10-CM | POA: Diagnosis not present

## 2021-01-01 DIAGNOSIS — E785 Hyperlipidemia, unspecified: Secondary | ICD-10-CM | POA: Diagnosis not present

## 2021-01-02 ENCOUNTER — Encounter: Payer: Self-pay | Admitting: Internal Medicine

## 2021-01-02 ENCOUNTER — Ambulatory Visit (INDEPENDENT_AMBULATORY_CARE_PROVIDER_SITE_OTHER): Payer: Medicare HMO | Admitting: Internal Medicine

## 2021-01-02 ENCOUNTER — Other Ambulatory Visit: Payer: Self-pay

## 2021-01-02 VITALS — BP 134/70 | HR 70 | Temp 98.0°F | Resp 97 | Ht 71.0 in | Wt 347.0 lb

## 2021-01-02 DIAGNOSIS — N183 Chronic kidney disease, stage 3 unspecified: Secondary | ICD-10-CM | POA: Diagnosis not present

## 2021-01-02 DIAGNOSIS — H409 Unspecified glaucoma: Secondary | ICD-10-CM | POA: Diagnosis not present

## 2021-01-02 DIAGNOSIS — Z0001 Encounter for general adult medical examination with abnormal findings: Secondary | ICD-10-CM | POA: Diagnosis not present

## 2021-01-02 DIAGNOSIS — N1831 Chronic kidney disease, stage 3a: Secondary | ICD-10-CM | POA: Diagnosis not present

## 2021-01-02 DIAGNOSIS — I509 Heart failure, unspecified: Secondary | ICD-10-CM | POA: Diagnosis not present

## 2021-01-02 DIAGNOSIS — I130 Hypertensive heart and chronic kidney disease with heart failure and stage 1 through stage 4 chronic kidney disease, or unspecified chronic kidney disease: Secondary | ICD-10-CM | POA: Diagnosis not present

## 2021-01-02 DIAGNOSIS — I5032 Chronic diastolic (congestive) heart failure: Secondary | ICD-10-CM | POA: Diagnosis not present

## 2021-01-02 DIAGNOSIS — R3129 Other microscopic hematuria: Secondary | ICD-10-CM | POA: Diagnosis not present

## 2021-01-02 DIAGNOSIS — E1169 Type 2 diabetes mellitus with other specified complication: Secondary | ICD-10-CM | POA: Diagnosis not present

## 2021-01-02 DIAGNOSIS — E538 Deficiency of other specified B group vitamins: Secondary | ICD-10-CM | POA: Diagnosis not present

## 2021-01-02 DIAGNOSIS — M1019 Lead-induced gout, multiple sites: Secondary | ICD-10-CM | POA: Diagnosis not present

## 2021-01-02 DIAGNOSIS — E559 Vitamin D deficiency, unspecified: Secondary | ICD-10-CM | POA: Diagnosis not present

## 2021-01-02 DIAGNOSIS — G4733 Obstructive sleep apnea (adult) (pediatric): Secondary | ICD-10-CM | POA: Diagnosis not present

## 2021-01-02 DIAGNOSIS — E669 Obesity, unspecified: Secondary | ICD-10-CM

## 2021-01-02 DIAGNOSIS — T560X1D Toxic effect of lead and its compounds, accidental (unintentional), subsequent encounter: Secondary | ICD-10-CM | POA: Diagnosis not present

## 2021-01-02 DIAGNOSIS — I890 Lymphedema, not elsewhere classified: Secondary | ICD-10-CM | POA: Diagnosis not present

## 2021-01-02 DIAGNOSIS — E1122 Type 2 diabetes mellitus with diabetic chronic kidney disease: Secondary | ICD-10-CM | POA: Diagnosis not present

## 2021-01-02 DIAGNOSIS — I10 Essential (primary) hypertension: Secondary | ICD-10-CM | POA: Diagnosis not present

## 2021-01-02 DIAGNOSIS — E785 Hyperlipidemia, unspecified: Secondary | ICD-10-CM | POA: Diagnosis not present

## 2021-01-02 DIAGNOSIS — E1165 Type 2 diabetes mellitus with hyperglycemia: Secondary | ICD-10-CM

## 2021-01-02 MED ORDER — GLIPIZIDE ER 2.5 MG PO TB24: 2.5000 mg | ORAL_TABLET | Freq: Every day | ORAL | 3 refills | Status: DC

## 2021-01-02 NOTE — Assessment & Plan Note (Signed)
Lab Results  Component Value Date   [id number] 071 12/31/2020   Stable, cont oral replacement - b12 1000 mcg qd

## 2021-01-02 NOTE — Assessment & Plan Note (Signed)
BP Readings from Last 3 Encounters:  01/02/21 134/70  10/23/20 134/76  10/02/20 130/70   Stable, pt to continue medical treatment coreg, catapres, cardizem

## 2021-01-02 NOTE — Assessment & Plan Note (Signed)
Lab Results  Component Value Date   HGBA1C 6.3 12/31/2020   overcontrolled, pt to decrease to glipizide Er 2.5 qd

## 2021-01-02 NOTE — Assessment & Plan Note (Signed)
Lab Results  Component Value Date   CREATININE 1.54 (H) 12/31/2020   Stable overall, cont to avoid nephrotoxins

## 2021-01-02 NOTE — Assessment & Plan Note (Signed)
Age and sex appropriate education and counseling updated with regular exercise and diet Referrals for preventative services - refer optho, delcines colonoscopy Immunizations addressed - declines pneumovax or covid booster Smoking counseling  - none needed Evidence for depression or other mood disorder - none significant Most recent labs reviewed. I have personally reviewed and have noted: 1) the patient's medical and social history 2) The patient's current medications and supplements 3) The patient's height, weight, and BMI have been recorded in the chart

## 2021-01-02 NOTE — Assessment & Plan Note (Signed)
Asympt, for urology referral,  to f/u any worsening symptoms or concerns 

## 2021-01-02 NOTE — Assessment & Plan Note (Signed)
Also refer optho for needed routine f/u

## 2021-01-02 NOTE — Patient Instructions (Addendum)
Ok to change to glipizide ER 2.5 mg per day  Ok to increase the Vitamin D to 4000 units per day  Ok to take the B12 for at least 6 more months  Please continue all other medications as before, and refills have been done if requested.  Please have the pharmacy call with any other refills you may need.  Please continue your efforts at being more active, low cholesterol diet, and weight control.  You are otherwise up to date with prevention measures today.  Please keep your appointments with your specialists as you may have planned  You will be contacted regarding the referral for: eye doctor, and urology  Please make an Appointment to return in 6 months, or sooner if needed, also with Lab Appointment for testing done 3-5 days before at the [facility] (so this is for TWO appointments - please see the scheduling desk as you leave)  Due to the ongoing Covid 19 pandemic, our lab now requires an appointment for any labs done at our office.  If you need labs done and do not have an appointment, please call our office ahead of time to schedule before presenting to the lab for your testing.

## 2021-01-02 NOTE — Progress Notes (Signed)
Patient ID: Gerald Day, male   DOB: 27-Feb-1956, 65 y.o.   MRN: [id number]         Chief Complaint:: wellness exam and dm, glaucoma, lymphedema, low b12, low vit d, microhematuria       HPI:  Gerald Day is a 65 y.o. male here for wellness exam; due for eye exam especially for glaucoma as last seen about 4 yrs ago and needs referral; declines colonoscopy, pneumovax, and covid booster.                         Also taking vit D 2000 u qd, as well as B12 1000 qd,  Denies urinary symptoms such as dysuria, frequency, urgency, flank pain, hematuria or n/v, fever, chills.  Pt denies chest pain, increased sob or doe, wheezing, orthopnea, PND, palpitations, dizziness or syncope, except has ongoing bilateral LE lymphedema now with wraps.   Pt denies polydipsia, polyuria, Denies new worsening neuro s/s.   Pt denies fever, wt loss, night sweats, loss of appetite, or other constitutional symptoms  No other new complaints.  Lost 20 lbs in 6 wks with better diet.  Right knee much improved now with pain after PT. Wt Readings from Last 3 Encounters:  01/02/21 (!) 347 lb (157.4 kg)  11/12/20 (!) 367 lb 12.8 oz (166.8 kg)  10/23/20 (!) 350 lb (158.8 kg)   BP Readings from Last 3 Encounters:  01/02/21 134/70  10/23/20 134/76  10/02/20 130/70   Immunization History  Administered Date(s) Administered  . PFIZER(Purple Top)SARS-COV-2 Vaccination 02/26/2020, 03/27/2020  . Pneumococcal Polysaccharide-23 10/27/2013  . Td 08/28/2008  . Tdap 12/22/2018   Health Maintenance Due  Topic Date Due  . OPHTHALMOLOGY EXAM  Never done      Past Medical History:  Diagnosis Date  . Arthritis    IN KNEES  . Cellulitis 10/26/2013   LOWER RT EXTREMITY  . CELLULITIS/ABSCESS, LEG 06/27/2007  . COLONIC POLYPS   . CONGESTIVE HEART FAILURE   . DIABETES MELLITUS, TYPE II   . DIVERTICULOSIS, COLON   . ERECTILE DYSFUNCTION, ORGANIC   . GOUT NOS   . Headache   . HYPERLIPIDEMIA   . HYPERTENSION   . LOW BACK PAIN    . Morbid obesity ([place])   . SLEEP APNEA, OBSTRUCTIVE    USES CPAP   Past Surgical History:  Procedure Laterality Date  . APPENDECTOMY    . CARDIOVERSION N/A 08/02/2020   Procedure: CARDIOVERSION;  Surgeon: Sanda Klein, MD;  Location: [facility];  Service: Cardiovascular;  Laterality: N/A;  . FOOT SURGERY     LT  . TEE WITHOUT CARDIOVERSION N/A 08/02/2020   Procedure: TRANSESOPHAGEAL ECHOCARDIOGRAM (TEE);  Surgeon: Sanda Klein, MD;  Location: [place];  Service: Cardiovascular;  Laterality: N/A;  . TENOTOMY / FLEXOR TENDON TRANSFER Right 03/15/2015   Procedure: TENOTOMY HT REPAIR/ULCER DEBRIDEMENT/GRAFT PREP/ACELL GRAFT APPLICATION;  Surgeon: Jana Half, DPM;  Location: [place];  Service: Podiatry;  Laterality: Right;  HALLUX    reports that he has never smoked. He has never used smokeless tobacco. He reports previous alcohol use of about 3.0 - 4.0 standard drinks of alcohol per week. He reports that he does not use drugs. family history includes Asthma in an other family member; Cardiomyopathy in his father. Allergies  Allergen Reactions  . Penicillins Other (See Comments)    Unknown childhood allergic reaction Has patient had a PCN reaction causing immediate rash, facial/tongue/throat swelling, SOB or lightheadedness with hypotension:  Unknown Has patient had a PCN reaction causing severe rash involving mucus membranes or skin necrosis: Unknown Has patient had a PCN reaction that required hospitalization: No Has patient had a PCN reaction occurring within the last 10 years: No If all of the above answers are "NO", then may proceed with Cephalosporin use.   . Tizanidine Other (See Comments)    07/26/20: Pt does not recognize drug and does not remember being allergic to it.   Current Outpatient Medications on File Prior to Visit  Medication Sig Dispense Refill  . allopurinol (ZYLOPRIM) 100 MG tablet Take 1 tablet (100 mg total) by mouth daily. 90 tablet 3  .  atorvastatin (LIPITOR) 10 MG tablet TAKE 1 TABLET (10 MG TOTAL) BY MOUTH DAILY. 90 tablet 3  . Cholecalciferol (THERA-D 2000) 50 MCG (2000 UT) TABS 1 tab by mouth once daily 90 tablet 3  . colchicine 0.6 MG tablet Take 1 every hour until pain improved or diarrhea, then once daily as needed 40 tablet 5  . furosemide (LASIX) 80 MG tablet Take 1 tablet (80 mg total) by mouth daily. 90 tablet 3  . meclizine (ANTIVERT) 25 MG tablet Take 25 mg by mouth every 6 (six) hours as needed for dizziness.    . potassium chloride SA (KLOR-CON M20) 20 MEQ tablet Take 1 tablet (20 mEq total) by mouth daily. 90 tablet 3  . vitamin B-12 (CYANOCOBALAMIN) 1000 MCG tablet Take 1 tablet (1,000 mcg total) by mouth daily. 90 tablet 3  . apixaban (ELIQUIS) 5 MG TABS tablet Take 1 tablet (5 mg total) by mouth 2 (two) times daily. 180 tablet 3  . carvedilol (COREG) 12.5 MG tablet Take 1 tablet (12.5 mg total) by mouth 2 (two) times daily with a meal. 180 tablet 2  . cloNIDine (CATAPRES) 0.2 MG tablet Take 1 tablet (0.2 mg total) by mouth 2 (two) times daily. 180 tablet 3  . diltiazem (CARDIZEM CD) 180 MG 24 hr capsule Take 1 capsule (180 mg total) by mouth daily. 90 capsule 3  . metFORMIN (GLUCOPHAGE) 500 MG tablet Take 2 tablets (1,000 mg total) by mouth 2 (two) times daily with a meal. 360 tablet 3   No current facility-administered medications on file prior to visit.        ROS:  All others reviewed and negative.  Objective        PE:  BP 134/70   Pulse 70   Temp 98 F (36.7 C) (Oral)   Resp (!) 97   Ht 5\' 11"  (1.803 m)   Wt (!) 347 lb (157.4 kg)   BMI 48.40 kg/m                 Constitutional: Pt appears in NAD               HENT: Head: NCAT.                Right Ear: External ear normal.                 Left Ear: External ear normal.                Eyes: . Pupils are equal, round, and reactive to light. Conjunctivae and EOM are normal               Nose: without d/c or deformity               Neck: Neck  supple. Gross normal ROM  Cardiovascular: Normal rate and regular rhythm.                 Pulmonary/Chest: Effort normal and breath sounds without rales or wheezing.                Abd:  Soft, NT, ND, + BS, no organomegaly               Neurological: Pt is alert. At baseline orientation, motor grossly intact               Skin: Skin is warm. No rashes, no other new lesions, LE edema - bilat 1+ lymphedema               Psychiatric: Pt behavior is normal without agitation   Micro: none  Cardiac tracings I have personally interpreted today:  none  Pertinent Radiological findings (summarize): none   Lab Results  Component Value Date   WBC 4.5 12/31/2020   HGB 11.3 (L) 12/31/2020   HCT 32.8 (L) 12/31/2020   PLT 186.0 12/31/2020   GLUCOSE 147 (H) 12/31/2020   CHOL 121 12/31/2020   TRIG 179.0 (H) 12/31/2020   HDL 35.00 (L) 12/31/2020   LDLDIRECT 83.0 12/19/2018   LDLCALC 50 12/31/2020   ALT 14 12/31/2020   AST 12 12/31/2020   NA 138 12/31/2020   K 4.5 12/31/2020   CL 104 12/31/2020   CREATININE 1.54 (H) 12/31/2020   BUN 32 (H) 12/31/2020   CO2 24 12/31/2020   TSH 4.30 12/31/2020   PSA 0.35 12/31/2020   INR 1.4 (H) 07/26/2020   HGBA1C 6.3 12/31/2020   MICROALBUR 159.7 (H) 12/31/2020   Assessment/Plan:  Gerald Day is a 65 y.o. White or Caucasian [1] male with  has a past medical history of Arthritis, Cellulitis (10/26/2013), CELLULITIS/ABSCESS, LEG (06/27/2007), COLONIC POLYPS, CONGESTIVE HEART FAILURE, DIABETES MELLITUS, TYPE II, DIVERTICULOSIS, COLON, ERECTILE DYSFUNCTION, ORGANIC, GOUT NOS, Headache, HYPERLIPIDEMIA, HYPERTENSION, LOW BACK PAIN, Morbid obesity ([place]), and SLEEP APNEA, OBSTRUCTIVE.  Encounter for well adult exam with abnormal findings Age and sex appropriate education and counseling updated with regular exercise and diet Referrals for preventative services - refer optho, delcines colonoscopy Immunizations addressed - declines pneumovax or covid  booster Smoking counseling  - none needed Evidence for depression or other mood disorder - none significant Most recent labs reviewed. I have personally reviewed and have noted: 1) the patient's medical and social history 2) The patient's current medications and supplements 3) The patient's height, weight, and BMI have been recorded in the chart   Vitamin D deficiency Last vitamin D Lab Results  Component Value Date   VD25OH 27.94 (L) 12/31/2020   Low to increase oral replacement to 4000 u qd   Stage 3a chronic kidney disease ([place]) Lab Results  Component Value Date   CREATININE 1.54 (H) 12/31/2020   Stable overall, cont to avoid nephrotoxins   Diabetes mellitus type 2 in obese [facility]) Lab Results  Component Value Date   HGBA1C 6.3 12/31/2020   overcontrolled, pt to decrease to glipizide Er 2.5 qd   B12 deficiency Lab Results  Component Value Date   [id number] 324 12/31/2020   Stable, cont oral replacement - b12 1000 mcg qd   Glaucoma Also refer optho for needed routine f/u  Essential hypertension BP Readings from Last 3 Encounters:  01/02/21 134/70  10/23/20 134/76  10/02/20 130/70   Stable, pt to continue medical treatment coreg, catapres, cardizem   Microhematuria Asympt, for urology referral,  to  f/u any worsening symptoms or concerns  Followup: Return in about 6 months (around 07/04/2021).  Cathlean Cower, MD 01/02/2021 9:50 PM [facility]

## 2021-01-02 NOTE — Assessment & Plan Note (Signed)
Last vitamin D Lab Results  Component Value Date   VD25OH 27.94 (L) 12/31/2020   Low to increase oral replacement to 4000 u qd

## 2021-01-03 LAB — PTH, INTACT AND CALCIUM
Calcium: 9.4 mg/dL (ref 8.6–10.3)
PTH: 21 pg/mL (ref 16–77)

## 2021-01-06 ENCOUNTER — Encounter: Payer: Self-pay | Admitting: Internal Medicine

## 2021-01-06 DIAGNOSIS — I890 Lymphedema, not elsewhere classified: Secondary | ICD-10-CM | POA: Diagnosis not present

## 2021-01-06 DIAGNOSIS — I5032 Chronic diastolic (congestive) heart failure: Secondary | ICD-10-CM | POA: Diagnosis not present

## 2021-01-06 DIAGNOSIS — I130 Hypertensive heart and chronic kidney disease with heart failure and stage 1 through stage 4 chronic kidney disease, or unspecified chronic kidney disease: Secondary | ICD-10-CM | POA: Diagnosis not present

## 2021-01-06 DIAGNOSIS — G4733 Obstructive sleep apnea (adult) (pediatric): Secondary | ICD-10-CM | POA: Diagnosis not present

## 2021-01-06 DIAGNOSIS — E785 Hyperlipidemia, unspecified: Secondary | ICD-10-CM | POA: Diagnosis not present

## 2021-01-06 DIAGNOSIS — T560X1D Toxic effect of lead and its compounds, accidental (unintentional), subsequent encounter: Secondary | ICD-10-CM | POA: Diagnosis not present

## 2021-01-06 DIAGNOSIS — M1019 Lead-induced gout, multiple sites: Secondary | ICD-10-CM | POA: Diagnosis not present

## 2021-01-06 DIAGNOSIS — N183 Chronic kidney disease, stage 3 unspecified: Secondary | ICD-10-CM | POA: Diagnosis not present

## 2021-01-06 DIAGNOSIS — E1122 Type 2 diabetes mellitus with diabetic chronic kidney disease: Secondary | ICD-10-CM | POA: Diagnosis not present

## 2021-01-07 DIAGNOSIS — N183 Chronic kidney disease, stage 3 unspecified: Secondary | ICD-10-CM | POA: Diagnosis not present

## 2021-01-07 DIAGNOSIS — T560X1D Toxic effect of lead and its compounds, accidental (unintentional), subsequent encounter: Secondary | ICD-10-CM | POA: Diagnosis not present

## 2021-01-07 DIAGNOSIS — I130 Hypertensive heart and chronic kidney disease with heart failure and stage 1 through stage 4 chronic kidney disease, or unspecified chronic kidney disease: Secondary | ICD-10-CM | POA: Diagnosis not present

## 2021-01-07 DIAGNOSIS — M1019 Lead-induced gout, multiple sites: Secondary | ICD-10-CM | POA: Diagnosis not present

## 2021-01-07 DIAGNOSIS — E1122 Type 2 diabetes mellitus with diabetic chronic kidney disease: Secondary | ICD-10-CM | POA: Diagnosis not present

## 2021-01-07 DIAGNOSIS — I5032 Chronic diastolic (congestive) heart failure: Secondary | ICD-10-CM | POA: Diagnosis not present

## 2021-01-07 DIAGNOSIS — I890 Lymphedema, not elsewhere classified: Secondary | ICD-10-CM | POA: Diagnosis not present

## 2021-01-07 DIAGNOSIS — E785 Hyperlipidemia, unspecified: Secondary | ICD-10-CM | POA: Diagnosis not present

## 2021-01-07 DIAGNOSIS — G4733 Obstructive sleep apnea (adult) (pediatric): Secondary | ICD-10-CM | POA: Diagnosis not present

## 2021-01-08 DIAGNOSIS — M1A0710 Idiopathic chronic gout, right ankle and foot, without tophus (tophi): Secondary | ICD-10-CM | POA: Diagnosis not present

## 2021-01-08 DIAGNOSIS — I4892 Unspecified atrial flutter: Secondary | ICD-10-CM | POA: Diagnosis not present

## 2021-01-08 DIAGNOSIS — L03116 Cellulitis of left lower limb: Secondary | ICD-10-CM | POA: Diagnosis not present

## 2021-01-08 DIAGNOSIS — M6281 Muscle weakness (generalized): Secondary | ICD-10-CM | POA: Diagnosis not present

## 2021-01-08 DIAGNOSIS — I5032 Chronic diastolic (congestive) heart failure: Secondary | ICD-10-CM | POA: Diagnosis not present

## 2021-01-13 ENCOUNTER — Telehealth: Payer: Self-pay | Admitting: Internal Medicine

## 2021-01-13 DIAGNOSIS — E1169 Type 2 diabetes mellitus with other specified complication: Secondary | ICD-10-CM

## 2021-01-13 NOTE — Telephone Encounter (Signed)
Patient called and said that he needs a referral to an eye doctor. Please advise

## 2021-01-14 DIAGNOSIS — I130 Hypertensive heart and chronic kidney disease with heart failure and stage 1 through stage 4 chronic kidney disease, or unspecified chronic kidney disease: Secondary | ICD-10-CM | POA: Diagnosis not present

## 2021-01-14 DIAGNOSIS — G4733 Obstructive sleep apnea (adult) (pediatric): Secondary | ICD-10-CM | POA: Diagnosis not present

## 2021-01-14 DIAGNOSIS — E785 Hyperlipidemia, unspecified: Secondary | ICD-10-CM | POA: Diagnosis not present

## 2021-01-14 DIAGNOSIS — I5032 Chronic diastolic (congestive) heart failure: Secondary | ICD-10-CM | POA: Diagnosis not present

## 2021-01-14 DIAGNOSIS — N183 Chronic kidney disease, stage 3 unspecified: Secondary | ICD-10-CM | POA: Diagnosis not present

## 2021-01-14 DIAGNOSIS — I890 Lymphedema, not elsewhere classified: Secondary | ICD-10-CM | POA: Diagnosis not present

## 2021-01-14 DIAGNOSIS — T560X1D Toxic effect of lead and its compounds, accidental (unintentional), subsequent encounter: Secondary | ICD-10-CM | POA: Diagnosis not present

## 2021-01-14 DIAGNOSIS — E1122 Type 2 diabetes mellitus with diabetic chronic kidney disease: Secondary | ICD-10-CM | POA: Diagnosis not present

## 2021-01-14 DIAGNOSIS — M1019 Lead-induced gout, multiple sites: Secondary | ICD-10-CM | POA: Diagnosis not present

## 2021-01-15 DIAGNOSIS — I5032 Chronic diastolic (congestive) heart failure: Secondary | ICD-10-CM | POA: Diagnosis not present

## 2021-01-15 DIAGNOSIS — M1019 Lead-induced gout, multiple sites: Secondary | ICD-10-CM | POA: Diagnosis not present

## 2021-01-15 DIAGNOSIS — G4733 Obstructive sleep apnea (adult) (pediatric): Secondary | ICD-10-CM | POA: Diagnosis not present

## 2021-01-15 DIAGNOSIS — T560X1D Toxic effect of lead and its compounds, accidental (unintentional), subsequent encounter: Secondary | ICD-10-CM | POA: Diagnosis not present

## 2021-01-15 DIAGNOSIS — I890 Lymphedema, not elsewhere classified: Secondary | ICD-10-CM | POA: Diagnosis not present

## 2021-01-15 DIAGNOSIS — N183 Chronic kidney disease, stage 3 unspecified: Secondary | ICD-10-CM | POA: Diagnosis not present

## 2021-01-15 DIAGNOSIS — E785 Hyperlipidemia, unspecified: Secondary | ICD-10-CM | POA: Diagnosis not present

## 2021-01-15 DIAGNOSIS — I130 Hypertensive heart and chronic kidney disease with heart failure and stage 1 through stage 4 chronic kidney disease, or unspecified chronic kidney disease: Secondary | ICD-10-CM | POA: Diagnosis not present

## 2021-01-15 DIAGNOSIS — E1122 Type 2 diabetes mellitus with diabetic chronic kidney disease: Secondary | ICD-10-CM | POA: Diagnosis not present

## 2021-01-20 ENCOUNTER — Encounter: Payer: Self-pay | Admitting: Internal Medicine

## 2021-01-20 DIAGNOSIS — E1122 Type 2 diabetes mellitus with diabetic chronic kidney disease: Secondary | ICD-10-CM | POA: Diagnosis not present

## 2021-01-20 DIAGNOSIS — E785 Hyperlipidemia, unspecified: Secondary | ICD-10-CM | POA: Diagnosis not present

## 2021-01-20 DIAGNOSIS — I5032 Chronic diastolic (congestive) heart failure: Secondary | ICD-10-CM | POA: Diagnosis not present

## 2021-01-20 DIAGNOSIS — T560X1D Toxic effect of lead and its compounds, accidental (unintentional), subsequent encounter: Secondary | ICD-10-CM | POA: Diagnosis not present

## 2021-01-20 DIAGNOSIS — N183 Chronic kidney disease, stage 3 unspecified: Secondary | ICD-10-CM | POA: Diagnosis not present

## 2021-01-20 DIAGNOSIS — G4733 Obstructive sleep apnea (adult) (pediatric): Secondary | ICD-10-CM | POA: Diagnosis not present

## 2021-01-20 DIAGNOSIS — I890 Lymphedema, not elsewhere classified: Secondary | ICD-10-CM | POA: Diagnosis not present

## 2021-01-20 DIAGNOSIS — I130 Hypertensive heart and chronic kidney disease with heart failure and stage 1 through stage 4 chronic kidney disease, or unspecified chronic kidney disease: Secondary | ICD-10-CM | POA: Diagnosis not present

## 2021-01-20 DIAGNOSIS — M1019 Lead-induced gout, multiple sites: Secondary | ICD-10-CM | POA: Diagnosis not present

## 2021-01-21 ENCOUNTER — Telehealth: Payer: Self-pay | Admitting: Orthopaedic Surgery

## 2021-01-21 NOTE — Telephone Encounter (Signed)
Patient called. He would like gel injections in his knees. His call back number is [phone]

## 2021-01-21 NOTE — Telephone Encounter (Signed)
Noted  

## 2021-01-21 NOTE — Telephone Encounter (Signed)
Please submit for bil knee gel inj's.

## 2021-01-28 DIAGNOSIS — I130 Hypertensive heart and chronic kidney disease with heart failure and stage 1 through stage 4 chronic kidney disease, or unspecified chronic kidney disease: Secondary | ICD-10-CM | POA: Diagnosis not present

## 2021-01-28 DIAGNOSIS — E785 Hyperlipidemia, unspecified: Secondary | ICD-10-CM | POA: Diagnosis not present

## 2021-01-28 DIAGNOSIS — G4733 Obstructive sleep apnea (adult) (pediatric): Secondary | ICD-10-CM | POA: Diagnosis not present

## 2021-01-28 DIAGNOSIS — M1019 Lead-induced gout, multiple sites: Secondary | ICD-10-CM | POA: Diagnosis not present

## 2021-01-28 DIAGNOSIS — N183 Chronic kidney disease, stage 3 unspecified: Secondary | ICD-10-CM | POA: Diagnosis not present

## 2021-01-28 DIAGNOSIS — E1122 Type 2 diabetes mellitus with diabetic chronic kidney disease: Secondary | ICD-10-CM | POA: Diagnosis not present

## 2021-01-28 DIAGNOSIS — I890 Lymphedema, not elsewhere classified: Secondary | ICD-10-CM | POA: Diagnosis not present

## 2021-01-28 DIAGNOSIS — I5032 Chronic diastolic (congestive) heart failure: Secondary | ICD-10-CM | POA: Diagnosis not present

## 2021-01-28 DIAGNOSIS — T560X1D Toxic effect of lead and its compounds, accidental (unintentional), subsequent encounter: Secondary | ICD-10-CM | POA: Diagnosis not present

## 2021-02-05 ENCOUNTER — Telehealth: Payer: Self-pay | Admitting: Orthopaedic Surgery

## 2021-02-05 ENCOUNTER — Telehealth: Payer: Self-pay

## 2021-02-05 DIAGNOSIS — M1019 Lead-induced gout, multiple sites: Secondary | ICD-10-CM | POA: Diagnosis not present

## 2021-02-05 DIAGNOSIS — E1122 Type 2 diabetes mellitus with diabetic chronic kidney disease: Secondary | ICD-10-CM | POA: Diagnosis not present

## 2021-02-05 DIAGNOSIS — E785 Hyperlipidemia, unspecified: Secondary | ICD-10-CM | POA: Diagnosis not present

## 2021-02-05 DIAGNOSIS — N183 Chronic kidney disease, stage 3 unspecified: Secondary | ICD-10-CM | POA: Diagnosis not present

## 2021-02-05 DIAGNOSIS — T560X1D Toxic effect of lead and its compounds, accidental (unintentional), subsequent encounter: Secondary | ICD-10-CM | POA: Diagnosis not present

## 2021-02-05 DIAGNOSIS — I5032 Chronic diastolic (congestive) heart failure: Secondary | ICD-10-CM | POA: Diagnosis not present

## 2021-02-05 DIAGNOSIS — G4733 Obstructive sleep apnea (adult) (pediatric): Secondary | ICD-10-CM | POA: Diagnosis not present

## 2021-02-05 DIAGNOSIS — I130 Hypertensive heart and chronic kidney disease with heart failure and stage 1 through stage 4 chronic kidney disease, or unspecified chronic kidney disease: Secondary | ICD-10-CM | POA: Diagnosis not present

## 2021-02-05 DIAGNOSIS — I890 Lymphedema, not elsewhere classified: Secondary | ICD-10-CM | POA: Diagnosis not present

## 2021-02-05 NOTE — Telephone Encounter (Signed)
Pt calling to check on where we are in the process of getting the gel injections with Dr. Erlinda Hong approved. Pt originally call 01/21/21 and wanted to know if there was anything on his end to be done to help the process along. The best call back number is [phone].

## 2021-02-05 NOTE — Telephone Encounter (Signed)
VOB has been submitted for Monovisc, bilateral knee. Pending BV. PA required

## 2021-02-05 NOTE — Telephone Encounter (Signed)
Tried calling patient to schedule an appointment for gel injection with Dr. Erlinda Hong , but no answer and no VM.

## 2021-02-05 NOTE — Progress Notes (Signed)
02/06/21- 65 yoM never smoker for sleep evaluation courtesy of Dr Jenny Reichmann with concern of OSA Medical problem list includes OSA, HTN, PAFib/ cardioversion, dCHF, Diverticulitis, Lymphedema,  DM2, CKD 3a, Hyperlipidemia, Glaucoma, Cellulitis leg, Morbid Obesity, Gout,  NPSG  11/02/00- AHI 92.2/ hr, desaturation to 62%, body weight 450 lbs CPAP [employer] Epworth score-3 Body weight today-354 lbs           Lives alone Covid vax- LOV [place] 2017.- weighted 406 lbs,  -----Pt is a former pt of Dr. Elsworth Soho, last seen 11/04/15. Pt states that he is currently on CPAP, has a machine that was loaned to him from rehab after they broke his prior one.  Life is better with CPAP. Describes fully compliant. Feels machine may need adjustment. We need download. Denies hx ENT surgery, lung disease. Aware of heart rhythm issues.   Prior to Admission medications   Medication Sig Start Date End Date Taking? Authorizing Provider  allopurinol (ZYLOPRIM) 100 MG tablet Take 1 tablet (100 mg total) by mouth daily. 10/23/20  Yes Biagio Borg, MD  amLODipine (NORVASC) 10 MG tablet  10/04/20  Yes [provider]  apixaban (ELIQUIS) 5 MG TABS tablet Take 5 mg by mouth 2 (two) times daily.   Yes [provider]  atorvastatin (LIPITOR) 10 MG tablet TAKE 1 TABLET (10 MG TOTAL) BY MOUTH DAILY. 07/04/20  Yes Biagio Borg, MD  carvedilol (COREG) 12.5 MG tablet Take 12.5 mg by mouth 2 (two) times daily with a meal.   Yes [provider]  Cholecalciferol (THERA-D 2000) 50 MCG (2000 UT) TABS 1 tab by mouth once daily 06/28/20  Yes Biagio Borg, MD  cloNIDine (CATAPRES) 0.2 MG tablet Take 0.2 mg by mouth 2 (two) times daily.   Yes [provider]  colchicine 0.6 MG tablet Take 1 every hour until pain improved or diarrhea, then once daily as needed 12/05/20  Yes Biagio Borg, MD  diltiazem (CARDIZEM CD) 180 MG 24 hr capsule Take 180 mg by mouth daily.   Yes [provider]  furosemide (LASIX) 80 MG tablet  Take 1 tablet (80 mg total) by mouth daily. 10/23/20  Yes Biagio Borg, MD  glipiZIDE (GLUCOTROL XL) 2.5 MG 24 hr tablet Take 1 tablet (2.5 mg total) by mouth daily with breakfast. 01/02/21  Yes Biagio Borg, MD  meclizine (ANTIVERT) 25 MG tablet Take 25 mg by mouth every 6 (six) hours as needed for dizziness.   Yes [provider]  metFORMIN (GLUCOPHAGE) 500 MG tablet Take by mouth 2 (two) times daily with a meal.   Yes [provider]  potassium chloride SA (KLOR-CON M20) 20 MEQ tablet Take 1 tablet (20 mEq total) by mouth daily. 07/04/20  Yes Biagio Borg, MD  vitamin B-12 (CYANOCOBALAMIN) 1000 MCG tablet Take 1 tablet (1,000 mcg total) by mouth daily. 06/28/20  Yes Biagio Borg, MD  apixaban (ELIQUIS) 5 MG TABS tablet Take 1 tablet (5 mg total) by mouth 2 (two) times daily. 10/23/20 11/22/20  Biagio Borg, MD  carvedilol (COREG) 12.5 MG tablet Take 1 tablet (12.5 mg total) by mouth 2 (two) times daily with a meal. 10/20/20 11/19/20  Biagio Borg, MD  cloNIDine (CATAPRES) 0.2 MG tablet Take 1 tablet (0.2 mg total) by mouth 2 (two) times daily. 10/23/20 11/22/20  Biagio Borg, MD  diltiazem (CARDIZEM CD) 180 MG 24 hr capsule Take 1 capsule (180 mg total) by mouth daily. 10/23/20 11/22/20  Jenny Reichmann,  Hunt Oris, MD  metFORMIN (GLUCOPHAGE) 500 MG tablet Take 2 tablets (1,000 mg total) by mouth 2 (two) times daily with a meal. 10/23/20 11/22/20  Biagio Borg, MD   Past Medical History:  Diagnosis Date   Arthritis    IN KNEES   Cellulitis 10/26/2013   LOWER RT EXTREMITY   CELLULITIS/ABSCESS, LEG 06/27/2007   COLONIC POLYPS    CONGESTIVE HEART FAILURE    DIABETES MELLITUS, TYPE II    DIVERTICULOSIS, COLON    ERECTILE DYSFUNCTION, ORGANIC    GOUT NOS    Headache    HYPERLIPIDEMIA    HYPERTENSION    LOW BACK PAIN    Morbid obesity ([place])    SLEEP APNEA, OBSTRUCTIVE    USES CPAP   Past Surgical History:  Procedure Laterality Date   APPENDECTOMY     CARDIOVERSION N/A 08/02/2020    Procedure: CARDIOVERSION;  Surgeon: Sanda Klein, MD;  Location: [place] [facility];  Service: Cardiovascular;  Laterality: N/A;   FOOT SURGERY     LT   TEE WITHOUT CARDIOVERSION N/A 08/02/2020   Procedure: TRANSESOPHAGEAL ECHOCARDIOGRAM (TEE);  Surgeon: Sanda Klein, MD;  Location: [facility];  Service: Cardiovascular;  Laterality: N/A;   TENOTOMY / FLEXOR TENDON TRANSFER Right 03/15/2015   Procedure: TENOTOMY HT REPAIR/ULCER DEBRIDEMENT/GRAFT PREP/ACELL GRAFT APPLICATION;  Surgeon: Jana Half, DPM;  Location: [place];  Service: Podiatry;  Laterality: Right;  HALLUX   Family History  Problem Relation Age of Onset   Cardiomyopathy Father    Asthma Other    Social History   Socioeconomic History   Marital status: Married    Spouse name: Not on file   Number of children: Not on file   Years of education: Not on file   Highest education level: Not on file  Occupational History   Occupation: retired  Tobacco Use   Smoking status: Never   Smokeless tobacco: Never  Vaping Use   Vaping Use: Never used  Substance and Sexual Activity   Alcohol use: Not Currently    Alcohol/week: 3.0 - 4.0 standard drinks    Types: 3 - 4 Cans of beer per week    Comment: hardly any; h/o heavy use   Drug use: No   Sexual activity: Not on file  Other Topics Concern   Not on file  Social History Narrative   Not on file   Social Determinants of Health   Financial Resource Strain: Not on file  Food Insecurity: Not on file  Transportation Needs: Not on file  Physical Activity: Not on file  Stress: Not on file  Social Connections: Not on file  Intimate Partner Violence: Not on file   ROS-see HPI   + = positive Constitutional:    weight loss, night sweats, fevers, chills, fatigue, lassitude. HEENT:    headaches, difficulty swallowing, tooth/dental problems, sore throat,       sneezing, itching, ear ache, nasal congestion, post nasal drip, snoring CV:    chest pain, orthopnea, PND, swelling in  lower extremities, anasarca,                                  dizziness, palpitations Resp:   shortness of breath with exertion or at rest.                productive cough,   non-productive cough, coughing up of blood.  change in color of mucus.  wheezing.   Skin:    rash or lesions. GI:  No-   heartburn, indigestion, abdominal pain, nausea, vomiting, diarrhea,                 change in bowel habits, loss of appetite GU: dysuria, change in color of urine, no urgency or frequency.   flank pain. MS:   joint pain, stiffness, decreased range of motion, back pain. Neuro-     nothing unusual Psych:  change in mood or affect.  depression or anxiety.   memory loss. OBJ- Physical Exam General- Alert, Oriented, Affect-appropriate, Distress- none acute, + obese,  + rolling walker,  Skin- rash-none, lesions- none, excoriation- none Lymphadenopathy- none Head- atraumatic            Eyes- +strabismus            Ears- Hearing, canals-normal            Nose- Clear, no-Septal dev, mucus, polyps, erosion, perforation             Throat- Mallampati II I, mucosa clear , drainage- none, tonsils- atrophic,  +teeth,  Neck- flexible , trachea midline, no stridor , thyroid nl, carotid no bruit Chest - symmetrical excursion , unlabored           Heart/CV- RRR , no murmur , no gallop  , no rub, nl s1 s2                           - JVD- none , edema+, stasis changes- none, varices- none           Lung- clear to P&A, wheeze- none, cough- none , dullness-none, rub- none           Chest wall-  Abd-  Br/ Gen/ Rectal- Not done, not indicated Extrem- + elastic hose Neuro- grossly intact to observation

## 2021-02-06 ENCOUNTER — Ambulatory Visit (INDEPENDENT_AMBULATORY_CARE_PROVIDER_SITE_OTHER): Payer: Medicare HMO | Admitting: Internal Medicine

## 2021-02-06 ENCOUNTER — Encounter: Payer: Self-pay | Admitting: Internal Medicine

## 2021-02-06 ENCOUNTER — Other Ambulatory Visit: Payer: Self-pay

## 2021-02-06 VITALS — BP 130/68 | HR 64 | Ht 72.0 in | Wt 354.0 lb

## 2021-02-06 DIAGNOSIS — G4733 Obstructive sleep apnea (adult) (pediatric): Secondary | ICD-10-CM

## 2021-02-06 NOTE — Patient Instructions (Signed)
Order- DME [employer]- please replace old CPAP machine auto 5-20, mask of choice, humidifier, supplies, AirView/ card    Patient fully compliant and benefts from CPAP  Please call if we can help

## 2021-02-07 ENCOUNTER — Telehealth: Payer: Self-pay

## 2021-02-07 NOTE — Telephone Encounter (Signed)
Tried calling patient again to schedule for gel injection, but no answer and no Vm set up to leave a message.  Approved for Monovisc, bilateral knee. [place] Patient is covered at 100% No Co-pay PA Approval# [id number] Valid 02/05/2021- 05/06/2021

## 2021-02-08 DIAGNOSIS — M6281 Muscle weakness (generalized): Secondary | ICD-10-CM | POA: Diagnosis not present

## 2021-02-08 DIAGNOSIS — L03116 Cellulitis of left lower limb: Secondary | ICD-10-CM | POA: Diagnosis not present

## 2021-02-08 DIAGNOSIS — I4892 Unspecified atrial flutter: Secondary | ICD-10-CM | POA: Diagnosis not present

## 2021-02-08 DIAGNOSIS — M1A0710 Idiopathic chronic gout, right ankle and foot, without tophus (tophi): Secondary | ICD-10-CM | POA: Diagnosis not present

## 2021-02-08 DIAGNOSIS — I5032 Chronic diastolic (congestive) heart failure: Secondary | ICD-10-CM | POA: Diagnosis not present

## 2021-02-10 DIAGNOSIS — I5032 Chronic diastolic (congestive) heart failure: Secondary | ICD-10-CM | POA: Diagnosis not present

## 2021-02-10 DIAGNOSIS — N183 Chronic kidney disease, stage 3 unspecified: Secondary | ICD-10-CM | POA: Diagnosis not present

## 2021-02-10 DIAGNOSIS — I890 Lymphedema, not elsewhere classified: Secondary | ICD-10-CM | POA: Diagnosis not present

## 2021-02-10 DIAGNOSIS — G4733 Obstructive sleep apnea (adult) (pediatric): Secondary | ICD-10-CM | POA: Diagnosis not present

## 2021-02-10 DIAGNOSIS — I130 Hypertensive heart and chronic kidney disease with heart failure and stage 1 through stage 4 chronic kidney disease, or unspecified chronic kidney disease: Secondary | ICD-10-CM | POA: Diagnosis not present

## 2021-02-10 DIAGNOSIS — T560X1D Toxic effect of lead and its compounds, accidental (unintentional), subsequent encounter: Secondary | ICD-10-CM | POA: Diagnosis not present

## 2021-02-10 DIAGNOSIS — E1122 Type 2 diabetes mellitus with diabetic chronic kidney disease: Secondary | ICD-10-CM | POA: Diagnosis not present

## 2021-02-10 DIAGNOSIS — M1019 Lead-induced gout, multiple sites: Secondary | ICD-10-CM | POA: Diagnosis not present

## 2021-02-10 DIAGNOSIS — E785 Hyperlipidemia, unspecified: Secondary | ICD-10-CM | POA: Diagnosis not present

## 2021-02-13 ENCOUNTER — Ambulatory Visit (INDEPENDENT_AMBULATORY_CARE_PROVIDER_SITE_OTHER): Payer: Medicare HMO | Admitting: Orthopaedic Surgery

## 2021-02-13 ENCOUNTER — Other Ambulatory Visit: Payer: Self-pay

## 2021-02-13 DIAGNOSIS — M170 Bilateral primary osteoarthritis of knee: Secondary | ICD-10-CM | POA: Diagnosis not present

## 2021-02-13 MED ORDER — BUPIVACAINE HCL 0.25 % IJ SOLN
0.6600 mL | INTRAMUSCULAR | Status: AC | PRN
  Administered 2021-02-13: .66 mL via INTRA_ARTICULAR

## 2021-02-13 MED ORDER — HYALURONAN 88 MG/4ML IX SOSY
88.0000 mg | PREFILLED_SYRINGE | INTRA_ARTICULAR | Status: AC | PRN
  Administered 2021-02-13: 88 mg via INTRA_ARTICULAR

## 2021-02-13 MED ORDER — LIDOCAINE HCL 1 % IJ SOLN
3.0000 mL | INTRAMUSCULAR | Status: AC | PRN
  Administered 2021-02-13: 3 mL

## 2021-02-13 MED ORDER — LIDOCAINE HCL 1 % IJ SOLN
3.0000 mL | INTRAMUSCULAR | Status: AC | PRN
Start: 2021-02-13 — End: 2021-02-13
  Administered 2021-02-13: 3 mL

## 2021-02-13 NOTE — Progress Notes (Signed)
Office Visit Note   Patient: Gerald Day           Date of Birth: [birth year]           MRN: [id number] Visit Date: 02/13/2021              Requested by: Biagio Borg, MD [address] [place],  [place] [id number] PCP: Biagio Borg, MD   Assessment & Plan: Visit Diagnoses:  1. Bilateral primary osteoarthritis of knee     Plan: Impression is bilateral knee advanced degenerative joint disease.  Today, we proceeded with bilateral knee Monovisc injections.  The patient tolerated these well.  We have discussed repeat injections when his symptoms return.  He will follow-up with [place] as needed.  Follow-Up Instructions: Return if symptoms worsen or fail to improve.   Orders:  Orders Placed This Encounter  Procedures   Large Joint Inj: bilateral knee   No orders of the defined types were placed in this encounter.     Procedures: Large Joint Inj: bilateral knee on 02/13/2021 9:40 AM Indications: pain Details: 22 G needle, anterolateral approach Medications (Right): 0.66 mL bupivacaine 0.25 %; 3 mL lidocaine 1 %; 88 mg Hyaluronan 88 MG/4ML Medications (Left): 0.66 mL bupivacaine 0.25 %; 3 mL lidocaine 1 %; 88 mg Hyaluronan 88 MG/4ML     Clinical Data: No additional findings.   Subjective: Chief Complaint  Patient presents with   Right Knee - Pain   Left Knee - Pain    HPI patient is a pleasant 65 year old gentleman who comes in today for bilateral knee Monovisc injections.  He has had these previously with fantastic relief of symptoms lasting about a year and a half.     Objective: Vital Signs: There were no vitals taken for this visit.    Ortho Exam stable bilateral knee exam  Specialty Comments:  No specialty comments available.  Imaging: No new imaging   PMFS History: Patient Active Problem List   Diagnosis Date Noted   Microhematuria 01/02/2021   Right knee pain 10/26/2020   Anticoagulated 08/28/2020   Edema leg 08/28/2020   PAF (paroxysmal  atrial fibrillation) ([place])    Sepsis due to cellulitis ([place]) 07/26/2020   Stage 3a chronic kidney disease ([place]) 07/04/2020   Vitamin D deficiency 12/28/2019   B12 deficiency 12/28/2019   Glaucoma 12/22/2018   Radiculopathy, lumbar region 01/17/2018   Constipation 06/18/2017   AKI (acute kidney injury) ([place]) 06/02/2017   Chronic midline low back pain without sciatica 05/14/2017   Proteinuria 11/22/2016   Skin lesion 11/12/2015   Preop exam for internal medicine 01/16/2015   Cellulitis and abscess of leg 10/26/2013   DM foot ulcer ([place]) 10/23/2013   Encounter for well adult exam with abnormal findings 07/05/2011   History of colonic polyps 09/05/2009   COLONIC POLYPS 03/14/2008   DIVERTICULOSIS, COLON 03/14/2008   Hyperlipidemia 06/27/2007   CELLULITIS/ABSCESS, LEG 06/27/2007   Diabetes mellitus type 2 in obese ([place]) 05/21/2007   GOUT NOS 05/21/2007   Morbid obesity ([place]) 05/21/2007   ERECTILE DYSFUNCTION 05/21/2007   OSA (obstructive sleep apnea) 05/21/2007   Essential hypertension 05/21/2007   Past Medical History:  Diagnosis Date   Arthritis    IN KNEES   Cellulitis 10/26/2013   LOWER RT EXTREMITY   CELLULITIS/ABSCESS, LEG 06/27/2007   COLONIC POLYPS    CONGESTIVE HEART FAILURE    DIABETES MELLITUS, TYPE II    DIVERTICULOSIS, COLON    ERECTILE DYSFUNCTION, ORGANIC  GOUT NOS    Headache    HYPERLIPIDEMIA    HYPERTENSION    LOW BACK PAIN    Morbid obesity ([place])    SLEEP APNEA, OBSTRUCTIVE    USES CPAP    Family History  Problem Relation Age of Onset   Cardiomyopathy Father    Asthma Other     Past Surgical History:  Procedure Laterality Date   APPENDECTOMY     CARDIOVERSION N/A 08/02/2020   Procedure: CARDIOVERSION;  Surgeon: Sanda Klein, MD;  Location: [place];  Service: Cardiovascular;  Laterality: N/A;   FOOT SURGERY     LT   TEE WITHOUT CARDIOVERSION N/A 08/02/2020   Procedure: TRANSESOPHAGEAL ECHOCARDIOGRAM (TEE);  Surgeon: Sanda Klein,  MD;  Location: [facility] ENDOSCOPY;  Service: Cardiovascular;  Laterality: N/A;   TENOTOMY / FLEXOR TENDON TRANSFER Right 03/15/2015   Procedure: TENOTOMY HT REPAIR/ULCER DEBRIDEMENT/GRAFT PREP/ACELL GRAFT APPLICATION;  Surgeon: Jana Half, DPM;  Location: [place];  Service: Podiatry;  Laterality: Right;  HALLUX   Social History   Occupational History   Occupation: retired  Tobacco Use   Smoking status: Never   Smokeless tobacco: Never  Vaping Use   Vaping Use: Never used  Substance and Sexual Activity   Alcohol use: Not Currently    Alcohol/week: 3.0 - 4.0 standard drinks    Types: 3 - 4 Cans of beer per week    Comment: hardly any; h/o heavy use   Drug use: No   Sexual activity: Not on file

## 2021-02-21 ENCOUNTER — Encounter: Payer: Self-pay | Admitting: Internal Medicine

## 2021-02-21 NOTE — Assessment & Plan Note (Signed)
Benefits from CPAP and has been compliant Plan- [employer], replace old machine, auto 5-20

## 2021-02-21 NOTE — Assessment & Plan Note (Signed)
Significant benefits if he can lose weight. Realistically would need outside help. Consider referral to [facility]

## 2021-03-10 DIAGNOSIS — M1A0710 Idiopathic chronic gout, right ankle and foot, without tophus (tophi): Secondary | ICD-10-CM | POA: Diagnosis not present

## 2021-03-10 DIAGNOSIS — L03116 Cellulitis of left lower limb: Secondary | ICD-10-CM | POA: Diagnosis not present

## 2021-03-10 DIAGNOSIS — I5032 Chronic diastolic (congestive) heart failure: Secondary | ICD-10-CM | POA: Diagnosis not present

## 2021-03-10 DIAGNOSIS — I4892 Unspecified atrial flutter: Secondary | ICD-10-CM | POA: Diagnosis not present

## 2021-03-10 DIAGNOSIS — M6281 Muscle weakness (generalized): Secondary | ICD-10-CM | POA: Diagnosis not present

## 2021-03-19 ENCOUNTER — Ambulatory Visit (INDEPENDENT_AMBULATORY_CARE_PROVIDER_SITE_OTHER): Payer: Medicare HMO

## 2021-03-19 ENCOUNTER — Encounter: Payer: Self-pay | Admitting: Orthopaedic Surgery

## 2021-03-19 ENCOUNTER — Other Ambulatory Visit: Payer: Self-pay

## 2021-03-19 ENCOUNTER — Ambulatory Visit (INDEPENDENT_AMBULATORY_CARE_PROVIDER_SITE_OTHER): Payer: Medicare HMO | Admitting: Orthopaedic Surgery

## 2021-03-19 DIAGNOSIS — M25532 Pain in left wrist: Secondary | ICD-10-CM

## 2021-03-19 NOTE — Progress Notes (Signed)
Office Visit Note   Patient: Gerald Day           Date of Birth: 01/20/1956           MRN: [id number] Visit Date: 03/19/2021              Requested by: Biagio Borg, MD [address] [place],  [place] [address] PCP: Biagio Borg, MD   Assessment & Plan: Visit Diagnoses:  1. Pain in left wrist     Plan: Impression is left wrist de Quervain's tenosynovitis.  So, we discussed cortisone injection in addition to immobilizing his wrist in a removable thumb spica splint.  He is agreeable to this plan.  He will follow-up with [place] as needed.  Follow-Up Instructions: Return if symptoms worsen or fail to improve.   Orders:  Orders Placed This Encounter  Procedures   XR Wrist Complete Left   No orders of the defined types were placed in this encounter.     Procedures: Hand/UE Inj: L extensor compartment 1 for de Quervain's tenosynovitis on 03/19/2021 10:10 AM Medications: 0.3 mL lidocaine 1 %; 0.33 mL bupivacaine 0.25 %; 13.33 mg methylPREDNISolone acetate 40 MG/ML     Clinical Data: No additional findings.   Subjective: Chief Complaint  Patient presents with   Left Hand - Pain    HPI patient is a pleasant 65 year old gentleman who comes in today with left radial sided wrist pain.  He has been dealing with this for about a month.  He notes that he had a gouty attack to the left hand a month ago where he was started on medicine.  This helped everywhere in his hand except for the radial side of the wrist.  The pain he has is worse with any movement of the thumb.  He has been taking Advil without significant relief.  He denies any paresthesias.  Review of Systems as detailed in HPI.  All others are negative.   Objective: Vital Signs: There were no vitals taken for this visit.  Physical Exam well-developed and well-nourished gentleman in no acute distress.  Alert and oriented x3.  Ortho Exam examination of the left wrist shows moderate tenderness over the first  dorsal compartment.  Mild tenderness to the [facility] joint.  No pain or crepitus with grind test.  Has a positive Finkelstein.  He is neurovascular distally.  Specialty Comments:  No specialty comments available.  Imaging: XR Wrist Complete Left  Result Date: 03/19/2021 X-rays demonstrate mild degenerative changes in the first [facility] joint    PMFS History: Patient Active Problem List   Diagnosis Date Noted   Microhematuria 01/02/2021   Right knee pain 10/26/2020   Anticoagulated 08/28/2020   Edema leg 08/28/2020   PAF (paroxysmal atrial fibrillation) ([place])    Sepsis due to cellulitis ([place]) 07/26/2020   Stage 3a chronic kidney disease ([place]) 07/04/2020   Vitamin D deficiency 12/28/2019   B12 deficiency 12/28/2019   Glaucoma 12/22/2018   Radiculopathy, lumbar region 01/17/2018   Constipation 06/18/2017   AKI (acute kidney injury) ([place]) 06/02/2017   Chronic midline low back pain without sciatica 05/14/2017   Proteinuria 11/22/2016   Skin lesion 11/12/2015   Preop exam for internal medicine 01/16/2015   Cellulitis and abscess of leg 10/26/2013   DM foot ulcer ([place]) 10/23/2013   Encounter for well adult exam with abnormal findings 07/05/2011   History of colonic polyps 09/05/2009   COLONIC POLYPS 03/14/2008   DIVERTICULOSIS, COLON 03/14/2008   Hyperlipidemia  06/27/2007   CELLULITIS/ABSCESS, LEG 06/27/2007   Diabetes mellitus type 2 in obese ([place]) 05/21/2007   GOUT NOS 05/21/2007   Morbid obesity ([place]) 05/21/2007   ERECTILE DYSFUNCTION 05/21/2007   OSA (obstructive sleep apnea) 05/21/2007   Essential hypertension 05/21/2007   Past Medical History:  Diagnosis Date   Arthritis    IN KNEES   Cellulitis 10/26/2013   LOWER RT EXTREMITY   CELLULITIS/ABSCESS, LEG 06/27/2007   COLONIC POLYPS    CONGESTIVE HEART FAILURE    DIABETES MELLITUS, TYPE II    DIVERTICULOSIS, COLON    ERECTILE DYSFUNCTION, ORGANIC    GOUT NOS    Headache    HYPERLIPIDEMIA    HYPERTENSION    LOW BACK PAIN     Morbid obesity ([place])    SLEEP APNEA, OBSTRUCTIVE    USES CPAP    Family History  Problem Relation Age of Onset   Cardiomyopathy Father    Asthma Other     Past Surgical History:  Procedure Laterality Date   APPENDECTOMY     CARDIOVERSION N/A 08/02/2020   Procedure: CARDIOVERSION;  Surgeon: Sanda Klein, MD;  Location: [facility] ENDOSCOPY;  Service: Cardiovascular;  Laterality: N/A;   FOOT SURGERY     LT   TEE WITHOUT CARDIOVERSION N/A 08/02/2020   Procedure: TRANSESOPHAGEAL ECHOCARDIOGRAM (TEE);  Surgeon: Sanda Klein, MD;  Location: [facility] ENDOSCOPY;  Service: Cardiovascular;  Laterality: N/A;   TENOTOMY / FLEXOR TENDON TRANSFER Right 03/15/2015   Procedure: TENOTOMY HT REPAIR/ULCER DEBRIDEMENT/GRAFT PREP/ACELL GRAFT APPLICATION;  Surgeon: Jana Half, DPM;  Location: [facility];  Service: Podiatry;  Laterality: Right;  HALLUX   Social History   Occupational History   Occupation: retired  Tobacco Use   Smoking status: Never   Smokeless tobacco: Never  Vaping Use   Vaping Use: Never used  Substance and Sexual Activity   Alcohol use: Not Currently    Alcohol/week: 3.0 - 4.0 standard drinks    Types: 3 - 4 Cans of beer per week    Comment: hardly any; h/o heavy use   Drug use: No   Sexual activity: Not on file

## 2021-03-20 MED ORDER — LIDOCAINE HCL 1 % IJ SOLN
0.3000 mL | INTRAMUSCULAR | Status: AC | PRN
  Administered 2021-03-19: .3 mL

## 2021-03-20 MED ORDER — BUPIVACAINE HCL 0.25 % IJ SOLN
0.3300 mL | INTRAMUSCULAR | Status: AC | PRN
  Administered 2021-03-19: .33 mL

## 2021-03-20 MED ORDER — METHYLPREDNISOLONE ACETATE 40 MG/ML IJ SUSP
13.3300 mg | INTRAMUSCULAR | Status: AC | PRN
  Administered 2021-03-19: 13.33 mg

## 2021-04-02 ENCOUNTER — Other Ambulatory Visit: Payer: Self-pay

## 2021-04-02 ENCOUNTER — Ambulatory Visit (INDEPENDENT_AMBULATORY_CARE_PROVIDER_SITE_OTHER): Payer: Medicare HMO | Admitting: Orthopaedic Surgery

## 2021-04-02 ENCOUNTER — Encounter: Payer: Self-pay | Admitting: Orthopaedic Surgery

## 2021-04-02 DIAGNOSIS — M25532 Pain in left wrist: Secondary | ICD-10-CM | POA: Diagnosis not present

## 2021-04-02 MED ORDER — PREDNISONE 10 MG (21) PO TBPK: ORAL_TABLET | ORAL | 0 refills | Status: DC

## 2021-04-02 NOTE — Progress Notes (Signed)
Office Visit Note   Patient: Gerald Day           Date of Birth: 1956/02/23           MRN: [id number] Visit Date: 04/02/2021              Requested by: Gerald Borg, Day [address] [place],  [place] [address] PCP: Gerald Borg, Day   Assessment & Plan: Visit Diagnoses:  1. Pain in left wrist     Plan: His de Quervain's tenosynovitis has improved from the injection and immobilization.  New onset of ulnar-sided left wrist pain.  I suspect that this is related to tendinitis or arthritis possibly gout.  I placed him on a prednisone Dosepak and he should continue to wear the thumb spica brace for 3 weeks and then wean as tolerated.  Follow-up as needed.  Follow-Up Instructions: Return if symptoms worsen or fail to improve.   Orders:  No orders of the defined types were placed in this encounter.  Meds ordered this encounter  Medications   predniSONE (STERAPRED UNI-PAK 21 TAB) 10 MG (21) TBPK tablet    Sig: Take as directed    Dispense:  21 tablet    Refill:  0      Procedures: No procedures performed   Clinical Data: No additional findings.   Subjective: Chief Complaint  Patient presents with   Left Hand - Pain   Left Wrist - Pain    Gerald Day returns today for left wrist pain.  He had a cortisone injection on 03/19/2021 for de Quervain's which has helped but this weekend he developed ulnar-sided wrist pain.  Denies any injuries.  He feels like the pain is constant.  He does use a walker for ambulation.  He has been a wearing the thumb spica brace as well.   Review of Systems  Constitutional: Negative.   All other systems reviewed and are negative.   Objective: Vital Signs: There were no vitals taken for this visit.  Physical Exam Vitals and nursing note reviewed.  Constitutional:      Appearance: He is well-developed.  Pulmonary:     Effort: Pulmonary effort is normal.  Abdominal:     Palpations: Abdomen is soft.  Skin:    General: Skin is  warm.  Neurological:     Mental Status: He is alert and oriented to person, place, and time.  Psychiatric:        Behavior: Behavior normal.        Thought Content: Thought content normal.        Judgment: Judgment normal.    Ortho Exam Left wrist shows no significant swelling over the radial or ulnar side.  Minimal tenderness over the first dorsal wrist compartment.  Gentle range of motion produces moderate discomfort.  He has tenderness throughout the ulnar side of his wrist and with ulnar deviation.  He is unable to fully supinate the wrist secondary to pain.  No sensory deficits.  Strength is limited secondary to pain. Specialty Comments:  No specialty comments available.  Imaging: No results found.   PMFS History: Patient Active Problem List   Diagnosis Date Noted   Microhematuria 01/02/2021   Right knee pain 10/26/2020   Anticoagulated 08/28/2020   Edema leg 08/28/2020   PAF (paroxysmal atrial fibrillation) (Gerald Day)    Sepsis due to cellulitis (Gerald Day) 07/26/2020   Stage 3a chronic kidney disease (Gerald Day) 07/04/2020   Vitamin D deficiency 12/28/2019   B12  deficiency 12/28/2019   Glaucoma 12/22/2018   Radiculopathy, lumbar region 01/17/2018   Constipation 06/18/2017   AKI (acute kidney injury) (Gerald Day) 06/02/2017   Chronic midline low back pain without sciatica 05/14/2017   Proteinuria 11/22/2016   Skin lesion 11/12/2015   Preop exam for internal medicine 01/16/2015   Cellulitis and abscess of leg 10/26/2013   DM foot ulcer ([place]) 10/23/2013   Encounter for well adult exam with abnormal findings 07/05/2011   History of colonic polyps 09/05/2009   COLONIC POLYPS 03/14/2008   DIVERTICULOSIS, COLON 03/14/2008   Hyperlipidemia 06/27/2007   CELLULITIS/ABSCESS, LEG 06/27/2007   Diabetes mellitus type 2 in obese (Gerald Day) 05/21/2007   GOUT NOS 05/21/2007   Morbid obesity (Gerald Day) 05/21/2007   ERECTILE DYSFUNCTION 05/21/2007   OSA (obstructive sleep apnea) 05/21/2007   Essential  hypertension 05/21/2007   Past Medical History:  Diagnosis Date   Arthritis    IN KNEES   Cellulitis 10/26/2013   LOWER RT EXTREMITY   CELLULITIS/ABSCESS, LEG 06/27/2007   COLONIC POLYPS    CONGESTIVE HEART FAILURE    DIABETES MELLITUS, TYPE II    DIVERTICULOSIS, COLON    ERECTILE DYSFUNCTION, ORGANIC    GOUT NOS    Headache    HYPERLIPIDEMIA    HYPERTENSION    LOW BACK PAIN    Morbid obesity (Gerald Day)    SLEEP APNEA, OBSTRUCTIVE    USES CPAP    Family History  Problem Relation Age of Onset   Cardiomyopathy Father    Asthma Other     Past Surgical History:  Procedure Laterality Date   APPENDECTOMY     CARDIOVERSION N/A 08/02/2020   Procedure: CARDIOVERSION;  Surgeon: Gerald Day;  Location: [facility] ENDOSCOPY;  Service: Cardiovascular;  Laterality: N/A;   FOOT SURGERY     LT   TEE WITHOUT CARDIOVERSION N/A 08/02/2020   Procedure: TRANSESOPHAGEAL ECHOCARDIOGRAM (TEE);  Surgeon: Gerald Day;  Location: [facility] ENDOSCOPY;  Service: Cardiovascular;  Laterality: N/A;   TENOTOMY / FLEXOR TENDON TRANSFER Right 03/15/2015   Procedure: TENOTOMY HT REPAIR/ULCER DEBRIDEMENT/GRAFT PREP/ACELL GRAFT APPLICATION;  Surgeon: Gerald Day;  Location: [facility];  Service: Podiatry;  Laterality: Right;  HALLUX   Social History   Occupational History   Occupation: retired  Tobacco Use   Smoking status: Never   Smokeless tobacco: Never  Vaping Use   Vaping Use: Never used  Substance and Sexual Activity   Alcohol use: Not Currently    Alcohol/week: 3.0 - 4.0 standard drinks    Types: 3 - 4 Cans of beer per week    Comment: hardly any; h/o heavy use   Drug use: No   Sexual activity: Not on file

## 2021-04-03 ENCOUNTER — Telehealth: Payer: Self-pay | Admitting: Internal Medicine

## 2021-04-03 NOTE — Chronic Care Management (AMB) (Signed)
  Chronic Care Management   Outreach Note  04/03/2021 Name: Gerald Day MRN: [id number] DOB: 1956/04/07  Referred by: Biagio Borg, MD Reason for referral : No chief complaint on file.   An unsuccessful telephone outreach was attempted today. The patient was referred to the pharmacist for assistance with care management and care coordination.   Follow Up Plan:   Lauretta Grill Upstream Scheduler

## 2021-04-03 NOTE — Chronic Care Management (AMB) (Signed)
  Chronic Care Management   Note  04/03/2021 Name: Gerald Day MRN: [id number] DOB: 01/19/1956  Gerald Day is a 65 y.o. year old male who is a primary care patient of Biagio Borg, MD. I reached out to Shelby Dubin by phone today in response to a referral sent by Gerald Day's PCP, Biagio Borg, MD.   Mr. Henshaw was given information about Chronic Care Management services today including:  CCM service includes personalized support from designated clinical staff supervised by his physician, including individualized plan of care and coordination with other care providers 24/7 contact phone numbers for assistance for urgent and routine care needs. Service will only be billed when office clinical staff spend 20 minutes or more in a month to coordinate care. Only one practitioner may furnish and bill the service in a calendar month. The patient may stop CCM services at any time (effective at the end of the month) by phone call to the office staff.   Patient agreed to services and verbal consent obtained.   Follow up plan:   Lauretta Grill Upstream Scheduler

## 2021-04-10 DIAGNOSIS — M1A0710 Idiopathic chronic gout, right ankle and foot, without tophus (tophi): Secondary | ICD-10-CM | POA: Diagnosis not present

## 2021-04-10 DIAGNOSIS — I5032 Chronic diastolic (congestive) heart failure: Secondary | ICD-10-CM | POA: Diagnosis not present

## 2021-04-10 DIAGNOSIS — L03116 Cellulitis of left lower limb: Secondary | ICD-10-CM | POA: Diagnosis not present

## 2021-04-10 DIAGNOSIS — I4892 Unspecified atrial flutter: Secondary | ICD-10-CM | POA: Diagnosis not present

## 2021-04-10 DIAGNOSIS — M6281 Muscle weakness (generalized): Secondary | ICD-10-CM | POA: Diagnosis not present

## 2021-04-15 ENCOUNTER — Telehealth: Payer: Self-pay

## 2021-04-15 NOTE — Chronic Care Management (AMB) (Signed)
Chronic Care Management Pharmacy Assistant   Name: Gerald Day  MRN: [id number] DOB: 1955-11-13   Gerald Day is an 65 y.o. year old male who presents for his initial CCM visit with the clinical pharmacist.   Recent office visits:  01/02/21-Gerald Quin Hoop, MD (PCP) Seen for Annual exam. Decrease to glipizide Er 2.5 qd. increase the Vitamin D to 4000 units per day. Take the B12 for at least 6 more months. Referral to Urology and Opthalmology. Follow up in 6 months.  10/23/20-Gerald Quin Hoop, MD (PCP) Hospital follow up. Ok to use the OTC Voltaren gel to the right knee. Ambulatory referral to Pulmonology for the sleep apnea. Follow up in 2 months.  Recent consult visits:  04/02/21-Gerald M. Erlinda Hong, MD (Orthopedics) Seen for Pain in left wrist. Return if symptoms worsen or fail to improve.  03/19/21-Gerald M. Erlinda Hong, MD (Orthopedics) Seen for Pain in left wrist. X-rays completed.  Return if symptoms worsen or fail to improve.  02/13/21-Gerald M. Erlinda Hong, MD (Orthopedics) Seen for Bilateral knee pain. Given joint injection 0.66 mL bupivacaine 0.25 %; 3 mL lidocaine 1 %; 88 mg Hyaluronan 88 MG/4ML on bilateral knees.  02/06/21-Gerald D. Annamaria Boots, MD (Pulmonology) Sleep consult. Referral to [facility]. Follow up in 4 months.  11/12/20-Gerald M. Erlinda Hong, MD (Orthopedics) [facility] or Chronic pain of both knees. X-rays completed. Given Joint injection 2 mL lidocaine 1 %; 2 mL bupivacaine 0.5 %; 40 mg methylPREDNISolone acetate 40 MG/ML on bilateral knees. Return if symptoms worsen or fail to improve.  Hospital visits:  None in previous 6 months  Medications: Outpatient Encounter Medications as of 04/15/2021  Medication Sig   allopurinol (ZYLOPRIM) 100 MG tablet Take 1 tablet (100 mg total) by mouth daily.   amLODipine (NORVASC) 10 MG tablet    apixaban (ELIQUIS) 5 MG TABS tablet Take 1 tablet (5 mg total) by mouth 2 (two) times daily.   apixaban (ELIQUIS) 5 MG TABS tablet Take 5 mg  by mouth 2 (two) times daily.   atorvastatin (LIPITOR) 10 MG tablet TAKE 1 TABLET (10 MG TOTAL) BY MOUTH DAILY.   carvedilol (COREG) 12.5 MG tablet Take 1 tablet (12.5 mg total) by mouth 2 (two) times daily with a meal.   carvedilol (COREG) 12.5 MG tablet Take 12.5 mg by mouth 2 (two) times daily with a meal.   Cholecalciferol (THERA-D 2000) 50 MCG (2000 UT) TABS 1 tab by mouth once daily   cloNIDine (CATAPRES) 0.2 MG tablet Take 1 tablet (0.2 mg total) by mouth 2 (two) times daily.   cloNIDine (CATAPRES) 0.2 MG tablet Take 0.2 mg by mouth 2 (two) times daily.   colchicine 0.6 MG tablet Take 1 every hour until pain improved or diarrhea, then once daily as needed   diltiazem (CARDIZEM CD) 180 MG 24 hr capsule Take 1 capsule (180 mg total) by mouth daily.   diltiazem (CARDIZEM CD) 180 MG 24 hr capsule Take 180 mg by mouth daily.   furosemide (LASIX) 80 MG tablet Take 1 tablet (80 mg total) by mouth daily.   glipiZIDE (GLUCOTROL XL) 2.5 MG 24 hr tablet Take 1 tablet (2.5 mg total) by mouth daily with breakfast.   meclizine (ANTIVERT) 25 MG tablet Take 25 mg by mouth every 6 (six) hours as needed for dizziness.   metFORMIN (GLUCOPHAGE) 500 MG tablet Take 2 tablets (1,000 mg total) by mouth 2 (two) times daily with a meal.   metFORMIN (GLUCOPHAGE) 500 MG tablet Take by mouth  2 (two) times daily with a meal.   potassium chloride SA (KLOR-CON M20) 20 MEQ tablet Take 1 tablet (20 mEq total) by mouth daily.   predniSONE (STERAPRED UNI-PAK 21 TAB) 10 MG (21) TBPK tablet Take as directed   vitamin B-12 (CYANOCOBALAMIN) 1000 MCG tablet Take 1 tablet (1,000 mcg total) by mouth daily.   No facility-administered encounter medications on file as of 04/15/2021.   Allopurinol (ZYLOPRIM) 100 MG tablet Last filled:01/07/21 90 DS AmLODipine (NORVASC) 10 MG tablet Last filled:10/04/20 90 DS Apixaban (ELIQUIS) 5 MG TABS tablet Last filled:01/13/21 90 DS Atorvastatin (LIPITOR) 10 MG tablet Last filled:03/22/21 90  DS Carvedilol (COREG) 12.5 MG tablet Last filled:01/06/21 90 DS Cholecalciferol (THERA-D 2000) 50 MCG (2000 UT) TABS Last filled:None noted CloNIDine (CATAPRES) 0.2 MG tablet Last filled:01/07/21 90 DS Colchicine 0.6 MG tablet Last filled:12/06/20 30 DS Diltiazem (CARDIZEM CD) 180 MG 24 hr capsule Last filled:12/31/20 90 DS Furosemide (LASIX) 80 MG tablet Last filled:01/07/21 90 DS GlipiZIDE (GLUCOTROL XL) 2.5 MG 24 hr tablet Last filled:01/03/21 90 DS Meclizine (ANTIVERT) 25 MG tablet Last filled:10/11/20 1 DS MetFORMIN (GLUCOPHAGE) 500 MG tablet Last filled:02/26/21 90 DS Potassium chloride SA (KLOR-CON M20) 20 MEQ tablet Last filled:01/13/21 90 DS PredniSONE (STERAPRED UNI-PAK 21 TAB) 10 MG (21) TBPK tablet Last filled:10/17/18 5 DS Vitamin B-12 (CYANOCOBALAMIN) 1000 MCG tablet Last filled:12/28/19 90 DS  Star Rating Drugs: MetFORMIN (GLUCOPHAGE) 500 MG tablet Last filled:02/26/21 90 DS GlipiZIDE (GLUCOTROL XL) 2.5 MG 24 hr tablet Last filled:01/03/21 90 DS Atorvastatin (LIPITOR) 10 MG tablet Last filled:03/22/21 90 DS  [name], [name]

## 2021-05-06 ENCOUNTER — Ambulatory Visit (INDEPENDENT_AMBULATORY_CARE_PROVIDER_SITE_OTHER): Payer: Medicare HMO

## 2021-05-06 ENCOUNTER — Other Ambulatory Visit: Payer: Self-pay

## 2021-05-06 DIAGNOSIS — E1169 Type 2 diabetes mellitus with other specified complication: Secondary | ICD-10-CM

## 2021-05-06 DIAGNOSIS — I10 Essential (primary) hypertension: Secondary | ICD-10-CM

## 2021-05-06 DIAGNOSIS — E669 Obesity, unspecified: Secondary | ICD-10-CM | POA: Diagnosis not present

## 2021-05-06 DIAGNOSIS — N1831 Chronic kidney disease, stage 3a: Secondary | ICD-10-CM | POA: Diagnosis not present

## 2021-05-06 DIAGNOSIS — E785 Hyperlipidemia, unspecified: Secondary | ICD-10-CM | POA: Diagnosis not present

## 2021-05-06 NOTE — Progress Notes (Signed)
Chronic Care Management Pharmacy Note  05/06/2021 Name:  Gerald Day MRN:  [id number] DOB:  1956/06/27  Summary: - Patient doing well, most recent A1c well controlled at 6.3%, LDL at goal at 50 with last check, BP at last office visits well controlled, last gout attack was >1 year ago -Patient reports that he has been checking at home with a wrist cuff, has been running anywhere from 130-160/60-70 patient unsure of heart rate at home -Blood pressure checked in office today, was 152/72 with HR of 58, patient denies any symptoms of bradycardia -Patient notes that he has been taking ibuprofen 220m - 2 tablets daily for knee pain  Recommendations/Changes made from today's visit: -Recommending for patient to continue to monitor blood pressure daily, is planning to start exercising again, [facility] -Advised for patient to discontinue ibuprofen due to increased risk of bleeding with eliquis, patient to use acetaminophen (up to 2005mdaily) and voltaren gel on knees to help with pain (can also help reduce blood pressure as well)   Subjective: Gerald KOHLESs an [age].o. year old male who is a primary patient of JoJenny ReichmannJaHunt OrisMD.  The CCM team was consulted for assistance with disease management and care coordination needs.    Engaged with patient face to face for initial visit in response to provider referral for pharmacy case management and/or care coordination services.   Consent to Services:  The patient was given the following information about Chronic Care Management services today, agreed to services, and gave verbal consent: 1. CCM service includes personalized support from designated clinical staff supervised by the primary care provider, including individualized plan of care and coordination with other care providers 2. 24/7 contact phone numbers for assistance for urgent and routine care needs. 3. Service will only  be billed when office clinical staff spend 20 minutes or more in a month to coordinate care. 4. Only one practitioner may furnish and bill the service in a calendar month. 5.The patient may stop CCM services at any time (effective at the end of the month) by phone call to the office staff. 6. The patient will be responsible for cost sharing (co-pay) of up to 20% of the service fee (after annual deductible is met). Patient agreed to services and consent obtained.  Patient Care Team: JoBiagio BorgMD as PCP - GeCheral BayMD as PCP - Cardiology (Cardiology) SzDelice BisonaDarnelle MaffucciRPSwedish [facility] Pharmacist (Pharmacist)  Recent office visits:  01/02/21-James W.Quin HoopMD (PCP) Seen for Annual exam. Decrease to glipizide Er 2.5 qd. increase the Vitamin D to 4000 units per day. Take the B12 for at least 6 more months. Referral to Urology and Opthalmology. Follow up in 6 months.   10/23/20-James W.Quin HoopMD (PCP) Hospital follow up. Ok to use the OTC Voltaren gel to the right knee. Ambulatory referral to Pulmonology for the sleep apnea. Follow up in 2 months. Given prednisone taper    Recent consult visits:  04/02/21-Naiping M. XuErlinda HongMD (Orthopedics) Seen for Pain in left wrist. Return if symptoms worsen or fail to improve.   03/19/21-Naiping M. XuErlinda HongMD (Orthopedics) Seen for Pain in left wrist. X-rays completed.  Return if symptoms worsen or fail to improve.   02/13/21-Naiping M. XuErlinda HongMD (Orthopedics) Seen for Bilateral knee pain. Given joint injection 0.66 mL bupivacaine 0.25 %; 3 mL lidocaine 1 %; 88 mg Hyaluronan 88 MG/4ML on bilateral  knees.   02/06/21-Clinton D. Annamaria Boots, MD (Pulmonology) Sleep consult. Referral to [facility]. Follow up in 4 months.   11/12/20-Naiping M. Erlinda Hong, MD (Orthopedics) [facility] or Chronic pain of both knees. X-rays completed. Given Joint injection 2 mL lidocaine 1 %; 2 mL bupivacaine 0.5 %; 40 mg methylPREDNISolone acetate 40 MG/ML on bilateral knees. Return if  symptoms worsen or fail to improve.   Hospital visits:  None in previous 6 months  Objective:  Lab Results  Component Value Date   CREATININE 1.54 (H) 12/31/2020   BUN 32 (H) 12/31/2020   GFR 46.87 (L) 12/31/2020   GFRNONAA 43 (L) 10/02/2020   GFRAA 49 (L) 10/02/2020   NA 138 12/31/2020   K 4.5 12/31/2020   CALCIUM 9.4 12/31/2020   CALCIUM 9.3 12/31/2020   CO2 24 12/31/2020   GLUCOSE 147 (H) 12/31/2020    Lab Results  Component Value Date/Time   HGBA1C 6.3 12/31/2020 07:28 AM   HGBA1C 7.0 (H) 06/27/2020 07:45 AM   GFR 46.87 (L) 12/31/2020 07:28 AM   GFR 49.00 (L) 06/27/2020 07:45 AM   MICROALBUR 159.7 (H) 12/31/2020 07:28 AM   MICROALBUR 312.3 (H) 12/18/2019 11:39 AM    Last diabetic Eye exam:  No results found for: HMDIABEYEEXA  Last diabetic Foot exam:  No results found for: HMDIABFOOTEX   Lab Results  Component Value Date   CHOL 121 12/31/2020   HDL 35.00 (L) 12/31/2020   LDLCALC 50 12/31/2020   LDLDIRECT 83.0 12/19/2018   TRIG 179.0 (H) 12/31/2020   CHOLHDL 3 12/31/2020    Hepatic Function Latest Ref Rng & Units 12/31/2020 07/26/2020 06/27/2020  Total Protein 6.0 - 8.3 g/dL 7.2 6.9 6.8  Albumin 3.5 - 5.2 g/dL 3.7 2.7(L) 3.8  AST 0 - 37 U/L '12 21 11  ' ALT 0 - 53 U/L '14 22 11  ' Alk Phosphatase 39 - 117 U/L 91 87 75  Total Bilirubin 0.2 - 1.2 mg/dL 0.5 1.3(H) 0.4  Bilirubin, Direct 0.0 - 0.3 mg/dL 0.1 0.4(H) 0.1    Lab Results  Component Value Date/Time   TSH 4.30 12/31/2020 07:28 AM   TSH 2.23 12/18/2019 11:15 AM    CBC Latest Ref Rng & Units 12/31/2020 09/01/2020 08/28/2020  WBC 4.0 - 10.5 K/uL 4.5 12.3(H) 7.6  Hemoglobin 13.0 - 17.0 g/dL 11.3(L) 8.9(L) 8.3(L)  Hematocrit 39.0 - 52.0 % 32.8(L) 28.7(L) 26.1(L)  Platelets 150.0 - 400.0 K/uL 186.0 321 296    Lab Results  Component Value Date/Time   VD25OH 27.94 (L) 12/31/2020 07:28 AM   VD25OH 11.25 (L) 06/27/2020 07:45 AM    Clinical ASCVD: No  The ASCVD Risk score Mikey Bussing DC Jr., et al., 2013)  failed to calculate for the following reasons:   The valid total cholesterol range is 130 to 320 mg/dL    Depression screen [facility] 2/9 01/02/2021 01/02/2021 12/28/2019  Decreased Interest 0 0 0  Down, Depressed, Hopeless 0 0 1  PHQ - 2 Score 0 0 1     Social History   Tobacco Use  Smoking Status Never  Smokeless Tobacco Never   BP Readings from Last 3 Encounters:  02/06/21 130/68  01/02/21 134/70  10/23/20 134/76   Pulse Readings from Last 3 Encounters:  02/06/21 64  01/02/21 70  10/23/20 61   Wt Readings from Last 3 Encounters:  02/06/21 (!) 354 lb (160.6 kg)  01/02/21 (!) 347 lb (157.4 kg)  11/12/20 (!) 367 lb 12.8 oz (166.8 kg)   BMI Readings from  Last 3 Encounters:  02/06/21 48.01 kg/m  01/02/21 48.40 kg/m  11/12/20 50.58 kg/m    Assessment/Interventions: Review of patient past medical history, allergies, medications, health status, including review of consultants reports, laboratory and other test data, was performed as part of comprehensive evaluation and provision of chronic care management services.   SDOH:  (Social Determinants of Health) assessments and interventions performed: Yes  SDOH Screenings   Alcohol Screen: Not on file  Depression (PHQ2-9): Low Risk    PHQ-2 Score: 0  Financial Resource Strain: Low Risk    Difficulty of Paying Living Expenses: Not very hard  Food Insecurity: Not on file  Housing: Not on file  Physical Activity: Not on file  Social Connections: Not on file  Stress: Not on file  Tobacco Use: Low Risk    Smoking Tobacco Use: Never   Smokeless Tobacco Use: Never  Transportation Needs: Not on file    CCM Care Plan  Allergies  Allergen Reactions   Penicillins Other (See Comments)    Unknown childhood allergic reaction Has patient had a PCN reaction causing immediate rash, facial/tongue/throat swelling, SOB or lightheadedness with hypotension: Unknown Has patient had a PCN reaction causing severe rash involving mucus membranes  or skin necrosis: Unknown Has patient had a PCN reaction that required hospitalization: No Has patient had a PCN reaction occurring within the last 10 years: No If all of the above answers are "NO", then may proceed with Cephalosporin use.    Tizanidine Other (See Comments)    07/26/20: Pt does not recognize drug and does not remember being allergic to it.    Medications Reviewed Today     Reviewed by Tomasa Blase, [facility] (Pharmacist) on 05/06/21 at [place] List Status: <None>   Medication Order Taking? Sig Documenting Provider Last Dose Status Informant  allopurinol (ZYLOPRIM) 100 MG tablet [id number] Yes Take 1 tablet (100 mg total) by mouth daily. Biagio Borg, MD Taking Active   amLODipine (NORVASC) 10 MG tablet [id number] Yes  [provider] Taking Active   apixaban (ELIQUIS) 5 MG TABS tablet [id number] Yes Take 5 mg by mouth 2 (two) times daily. [provider] Taking Active   atorvastatin (LIPITOR) 10 MG tablet [id number] Yes TAKE 1 TABLET (10 MG TOTAL) BY MOUTH DAILY. Biagio Borg, MD Taking Active Nursing Home Medication Administration Guide (MAG)  carvedilol (COREG) 12.5 MG tablet [id number] Yes Take 12.5 mg by mouth 2 (two) times daily with a meal. [provider] Taking Active   Cholecalciferol (THERA-D 2000) 50 MCG (2000 UT) TABS [id number] Yes 1 tab by mouth once daily Biagio Borg, MD Taking Active Nursing Home Medication Administration Guide (MAG)  cloNIDine (CATAPRES) 0.2 MG tablet [id number] Yes Take 0.2 mg by mouth 2 (two) times daily. [provider] Taking Active   colchicine 0.6 MG tablet [id number] No Take 1 every hour until pain improved or diarrhea, then once daily as needed  Patient not taking: Reported on 05/06/2021   Biagio Borg, MD Not Taking Active   diltiazem (CARDIZEM CD) 180 MG 24 hr capsule [id number] Yes Take 180 mg by mouth daily. [provider] Taking Active   furosemide (LASIX) 80 MG tablet [id number] Yes Take  1 tablet (80 mg total) by mouth daily. Biagio Borg, MD Taking Active   glipiZIDE (GLUCOTROL XL) 2.5 MG 24 hr tablet [id number] Yes Take 1 tablet (2.5 mg total) by mouth daily with breakfast. Biagio Borg, MD Taking Active   metFORMIN (  GLUCOPHAGE) 500 MG tablet [id number] Yes Take by mouth 2 (two) times daily with a meal. [provider] Taking Active   potassium chloride SA (KLOR-CON M20) 20 MEQ tablet [id number] Yes Take 1 tablet (20 mEq total) by mouth daily. Biagio Borg, MD Taking Active Nursing Home Medication Administration Guide (MAG)  telmisartan (MICARDIS) 80 MG tablet [id number] Yes Take 80 mg by mouth daily. [provider] Taking Active   vitamin B-12 (CYANOCOBALAMIN) 1000 MCG tablet [id number] Yes Take 1 tablet (1,000 mcg total) by mouth daily. Biagio Borg, MD Taking Active Nursing Home Medication Administration Guide (MAG)            Patient Active Problem List   Diagnosis Date Noted   Microhematuria 01/02/2021   Right knee pain 10/26/2020   Anticoagulated 08/28/2020   Edema leg 08/28/2020   PAF (paroxysmal atrial fibrillation) ([place])    Sepsis due to cellulitis ([place]) 07/26/2020   Stage 3a chronic kidney disease ([place]) 07/04/2020   Vitamin D deficiency 12/28/2019   B12 deficiency 12/28/2019   Glaucoma 12/22/2018   Radiculopathy, lumbar region 01/17/2018   Constipation 06/18/2017   AKI (acute kidney injury) ([place]) 06/02/2017   Chronic midline low back pain without sciatica 05/14/2017   Proteinuria 11/22/2016   Skin lesion 11/12/2015   Preop exam for internal medicine 01/16/2015   Cellulitis and abscess of leg 10/26/2013   DM foot ulcer ([place]) 10/23/2013   Encounter for well adult exam with abnormal findings 07/05/2011   History of colonic polyps 09/05/2009   COLONIC POLYPS 03/14/2008   DIVERTICULOSIS, COLON 03/14/2008   Hyperlipidemia 06/27/2007   CELLULITIS/ABSCESS, LEG 06/27/2007   Diabetes mellitus type 2 in obese ([place]) 05/21/2007   GOUT NOS  05/21/2007   Morbid obesity ([place]) 05/21/2007   ERECTILE DYSFUNCTION 05/21/2007   OSA (obstructive sleep apnea) 05/21/2007   Essential hypertension 05/21/2007    Immunization History  Administered Date(s) Administered   PFIZER(Purple Top)SARS-COV-2 Vaccination 02/26/2020, 03/27/2020   Pneumococcal Polysaccharide-23 10/27/2013   Td 08/28/2008   Tdap 12/22/2018    Conditions to be addressed/monitored:  Hypertension, Hyperlipidemia, Diabetes, Atrial Fibrillation, Chronic Kidney Disease, and Gout  Care Plan : CCM Care Plan  Updates made by Tomasa Blase, RPH since 05/06/2021 12:00 AM     Problem: HTN, Afib, DM2, CKD, HLD, Gout   Priority: High  Onset Date: 05/06/2021     Long-Range Goal: Disease Management   Start Date: 05/06/2021  Expected End Date: 11/04/2021  This Visit's Progress: On track  Priority: High  Note:   Current Barriers:  Unable to independently monitor therapeutic efficacy  Pharmacist Clinical Goal(s):  Patient will achieve adherence to monitoring guidelines and medication adherence to achieve therapeutic efficacy maintain control of Blood pressure, LDL, Blood sugars, and gout as evidenced by BP logs, next lipid panel, next A1c, frequency of gout attacks  through collaboration with PharmD and provider.   Interventions: 1:1 collaboration with Biagio Borg, MD regarding development and update of comprehensive plan of care as evidenced by provider attestation and co-signature Inter-disciplinary care team collaboration (see longitudinal plan of care) Comprehensive medication review performed; medication list updated in electronic medical record  Hypertension (BP goal <130/80) -Controlled -Current treatment: Carvedilol 12.22m - 1 tablet twice daily  Clonidine 0.272m- 1 tablet twice daily  Diltiazem 18076m 1 capsule daily  Amlodipine 90m96m1 tablet daily  Telmisartan 80mg63m tablet daily  Furosemide 80mg 41mtablet daily  Potassium Chloride 20mEq 72m tablet daily  Last potassium level: 4.5 mEq/L (12/31/2020) -Medications previously tried: losartan -Current home readings: Averaging 130-160/60-70  (wrist cuff) / checked in office today, was 152/72 HR 58 BP Readings from Last 3 Encounters:  02/06/21 130/68  01/02/21 134/70  10/23/20 134/76  -Current dietary habits: eats sodium reduced diet  -Current exercise habits: none at this time - plans to get back into a silver sneakers - able to swim -Denies hypotensive/hypertensive symptoms -Educated on BP goals and benefits of medications for prevention of heart attack, stroke and kidney damage; Daily salt intake goal < 2300 mg; Exercise goal of 150 minutes per week; Importance of home blood pressure monitoring; Proper BP monitoring technique; Symptoms of hypotension and importance of maintaining adequate hydration; -Counseled to monitor BP at home daily , document, and provide log at future appointments -Counseled on diet and exercise extensively Recommended to continue current medication  Hyperlipidemia: (LDL goal < 70) -Controlled Lab Results  Component Value Date   LDLCALC 50 12/31/2020  -Current treatment: Atorvastatin 23m - 1 tablet daily  -Medications previously tried: pravastatin   -Current dietary patterns: reports to eating more lean meats such as t[place] chicken, trying to avoid foods high in cholesterol -Current exercise habits:  none at this time - plans to get back into a silver sneakers - able to swim -Educated on Cholesterol goals;  Benefits of statin for ASCVD risk reduction; Importance of limiting foods high in cholesterol; Exercise goal of 150 minutes per week; -Counseled on diet and exercise extensively Recommended to continue current medication  Diabetes (A1c goal <7%) -Controlled Lab Results  Component Value Date   HGBA1C 6.3 12/31/2020  -Current medications: Metformin 5020m- 2 tablets twice daily  Glipizide XL 2.14m36m 1 tablet daily  -Medications  previously tried: novContractoranTongaevemir, metformin XR  -Current home glucose readings fasting glucose: does not currently check -Denies hypoglycemic/hyperglycemic symptoms -Current meal patterns:  breakfast: eggs and grits, apple, cereal  lunch: sandwich, fruit, vegetables   dinner: turKuwaithicken, vegetable, carb snacks: fruits drinks: water, iced tea (sweet/unsweet) - varies, seldom drinks soda -Current exercise: none at this time - plans to get back into a silver sneakers - able to swim -Educated on A1c and blood sugar goals; Complications of diabetes including kidney damage, retinal damage, and cardiovascular disease; Exercise goal of 150 minutes per week; Benefits of weight loss; Prevention and management of hypoglycemic episodes; -Counseled to check feet daily and get yearly eye exams -Counseled on diet and exercise extensively Recommended to continue current medication  Atrial Fibrillation (Goal: prevent stroke and major bleeding) -Controlled -CHADSVASC: 3 -Current treatment: Rate control: diltiazem 180m4mily, carvedilol 12.14mg 99mce daily, clonidine 0.2mg t57me daily, amlodipine 10mg d79m  Anticoagulation: eliquis 14mg - 138mblet twice daily  -Medications previously tried: n/a -Home BP and HR readings: 130-160/60-70  (wrist cuff)  -Counseled on increased risk of stroke due to Afib and benefits of anticoagulation for stroke prevention; importance of adherence to anticoagulant exactly as prescribed; bleeding risk associated with eliquis and importance of self-monitoring for signs/symptoms of bleeding; avoidance of NSAIDs due to increased bleeding risk with anticoagulants; patient reports that he has been taking ibuprofen 200mg - 24mlet daily for pain, advised for use of acetaminopehn - up to 2000mg dail33md diclofenac 1% gel applied 4 times daily for pain seeking medical attention after a head injury or if there is blood in the urine/stool; -Recommended to continue  current medication  Gout (Goal: Prevention/ treatment of gout attacks) -Controlled - last gout  attack >1 year ago -Current treatment  Allopurinol 123m - 1 tablet daily  Colchicine 0.64m- 1 tablet every hour until pain has been relieved, then once daily thereafter  -Medications previously tried: n/a  -Counseled on diet and exercise extensively Recommended to continue current medication  Chronic Kidney Disease (Goal: Prevention of disease progression) -Controlled -Last eGFR: 46.87 mL/min (12/31/2020) -Current treatment  Avoidance of nephrotoxic agents / appropriate blood pressure and blood sugar control to prevent further kidney damage -Recommended to continue current medication -closely monitor eGFR and reduce metformin dosage should eGFR <4559min  Health Maintenance -Vaccine gaps: Shingles, COVID booster  -Current therapy:  Vitamin D3 2000 units- 1 tablet daily  Vitamin B12 1000m63m1 tablet daily  -Educated on Cost vs benefit of each product must be carefully weighed by individual consumer -Patient is satisfied with current therapy and denies issues -Recommended to continue current medication  Patient Goals/Self-Care Activities Patient will:  - take medications as prescribed check blood pressure daily, document, and provide at future appointments collaborate with provider on medication access solutions target a minimum of 150 minutes of moderate intensity exercise weekly  Follow Up Plan: Telephone follow up appointment with care management team member scheduled for: 4 months The patient has been provided with contact information for the care management team and has been advised to call with any health related questions or concerns.        Medication Assistance: None required.  Patient affirms current coverage meets needs.  Patient's preferred pharmacy is:  [id number] [place] [facility] [address] [id number]:  [phone] - [place] [id number]: [phone] pill box? No - keeps all of his medications in one box and keeps in order of when he takes in the day - pill pox is too small for him to use  Pt endorses 100% compliance  Care Plan and Follow Up Patient Decision:  Patient agrees to Care Plan and Follow-up.  Plan: Telephone follow up appointment with care management team member scheduled for:  4 months and The patient has been provided with contact information for the care management team and has been advised to call with any health related questions or concerns.   DanieTomasa BlasermD Clinical Pharmacist, [place]

## 2021-05-06 NOTE — Patient Instructions (Signed)
Visit Information   PATIENT GOALS:   Goals Addressed             This Visit's Progress    Track and Manage My Blood Pressure-Hypertension       Timeframe:  Long-Range Goal Priority:  High Start Date:   05/06/2021                          Expected End Date:  11/06/2021                     Follow Up Date 08/29/2021   - check blood pressure daily - choose a place to take my blood pressure (home, clinic or office, retail store) - write blood pressure results in a log or diary    Why is this important?   You won't feel high blood pressure, but it can still hurt your blood vessels.  High blood pressure can cause heart or kidney problems. It can also cause a stroke.  Making lifestyle changes like losing a little weight or eating less salt will help.  Checking your blood pressure at home and at different times of the day can help to control blood pressure.  If the doctor prescribes medicine remember to take it the way the doctor ordered.  Call the office if you cannot afford the medicine or if there are questions about it.           Consent to CCM Services: Mr. Kegley was given information about Chronic Care Management services including:  CCM service includes personalized support from designated clinical staff supervised by his physician, including individualized plan of care and coordination with other care providers 24/7 contact phone numbers for assistance for urgent and routine care needs. Service will only be billed when office clinical staff spend 20 minutes or more in a month to coordinate care. Only one practitioner may furnish and bill the service in a calendar month. The patient may stop CCM services at any time (effective at the end of the month) by phone call to the office staff. The patient will be responsible for cost sharing (co-pay) of up to 20% of the service fee (after annual deductible is met).  Patient agreed to services and verbal consent obtained.   The  patient verbalized understanding of instructions, educational materials, and care plan provided today and declined offer to receive copy of patient instructions, educational materials, and care plan.   Telephone follow up appointment with care management team member scheduled for: 4 months The patient has been provided with contact information for the care management team and has been advised to call with any health related questions or concerns.   Tomasa Blase, PharmD Clinical Pharmacist, [place]    CLINICAL CARE PLAN: Patient Care Plan: CCM Care Plan     Problem Identified: HTN, Afib, DM2, CKD, HLD, Gout   Priority: High  Onset Date: 05/06/2021     Long-Range Goal: Disease Management   Start Date: 05/06/2021  Expected End Date: 11/04/2021  This Visit's Progress: On track  Priority: High  Note:   Current Barriers:  Unable to independently monitor therapeutic efficacy  Pharmacist Clinical Goal(s):  Patient will achieve adherence to monitoring guidelines and medication adherence to achieve therapeutic efficacy maintain control of Blood pressure, LDL, Blood sugars, and gout as evidenced by BP logs, next lipid panel, next A1c, frequency of gout attacks  through collaboration with PharmD and provider.   Interventions:  1:1 collaboration with Biagio Borg, MD regarding development and update of comprehensive plan of care as evidenced by provider attestation and co-signature Inter-disciplinary care team collaboration (see longitudinal plan of care) Comprehensive medication review performed; medication list updated in electronic medical record  Hypertension (BP goal <130/80) -Controlled -Current treatment: Carvedilol 12.70m - 1 tablet twice daily  Clonidine 0.283m- 1 tablet twice daily  Diltiazem 18039m 1 capsule daily  Amlodipine 45m7m1 tablet daily  Telmisartan 80mg39m tablet daily  Furosemide 80mg 86mtablet daily  Potassium Chloride 20mEq 29mtablet  daily Last potassium level: 4.5 mEq/L (12/31/2020) -Medications previously tried: losartan, telmisartan  -Current home readings: Averaging 130-160/60-70  (wrist cuff) / checked in office today, was 152/72 HR 58 BP Readings from Last 3 Encounters:  02/06/21 130/68  01/02/21 134/70  10/23/20 134/76  -Current dietary habits: eats sodium reduced diet  -Current exercise habits: none at this time - plans to get back into a silver sneakers - able to swim -Denies hypotensive/hypertensive symptoms -Educated on BP goals and benefits of medications for prevention of heart attack, stroke and kidney damage; Daily salt intake goal < 2300 mg; Exercise goal of 150 minutes per week; Importance of home blood pressure monitoring; Proper BP monitoring technique; Symptoms of hypotension and importance of maintaining adequate hydration; -Counseled to monitor BP at home daily , document, and provide log at future appointments -Counseled on diet and exercise extensively Recommended to continue current medication  Hyperlipidemia: (LDL goal < 70) -Controlled Lab Results  Component Value Date   LDLCALC 50 12/31/2020  -Current treatment: Atorvastatin 45mg - 38mblet daily  -Medications previously tried: pravastatin   -Current dietary patterns: reports to eating more lean meats such as turkey [place], trying to avoid foods high in cholesterol -Current exercise habits:  none at this time - plans to get back into a silver sneakers - able to swim -Educated on Cholesterol goals;  Benefits of statin for ASCVD risk reduction; Importance of limiting foods high in cholesterol; Exercise goal of 150 minutes per week; -Counseled on diet and exercise extensively Recommended to continue current medication  Diabetes (A1c goal <7%) -Controlled Lab Results  Component Value Date   HGBA1C 6.3 12/31/2020  -Current medications: Metformin 500mg - 266mlets twice daily  Glipizide XL 2.5mg - 1 t37met daily   -Medications previously tried: novolog, jContractor l[place]onga metformin XR  -Current home glucose readings fasting glucose: does not currently check -Denies hypoglycemic/hyperglycemic symptoms -Current meal patterns:  breakfast: eggs and grits, apple, cereal  lunch: sandwich, fruit, vegetables   dinner: turkey, [place] vegetable, carb snacks: fruits drinks: water, iced tea (sweet/unsweet) - varies, seldom drinks soda -Current exercise: none at this time - plans to get back into a silver sneakers - able to swim -Educated on A1c and blood sugar goals; Complications of diabetes including kidney damage, retinal damage, and cardiovascular disease; Exercise goal of 150 minutes per week; Benefits of weight loss; Prevention and management of hypoglycemic episodes; -Counseled to check feet daily and get yearly eye exams -Counseled on diet and exercise extensively Recommended to continue current medication  Atrial Fibrillation (Goal: prevent stroke and major bleeding) -Controlled -CHADSVASC: 3 -Current treatment: Rate control: diltiazem 180mg daily28mrvedilol 12.5mg twice d29my, clonidine 0.2mg twice da74m, amlodipine 45mg daily  A3moagulation: eliquis 5mg - 1 tablet30mice daily  -Medications previously tried: n/a -Home BP and HR readings: 130-160/60-70  (wrist cuff)  -Counseled on increased risk of stroke due to Afib and  benefits of anticoagulation for stroke prevention; importance of adherence to anticoagulant exactly as prescribed; bleeding risk associated with eliquis and importance of self-monitoring for signs/symptoms of bleeding; avoidance of NSAIDs due to increased bleeding risk with anticoagulants; patient reports that he has been taking ibuprofen 2100m - 2 tablet daily for pain, advised for use of acetaminopehn - up to 20057mdaily and diclofenac 1% gel applied 4 times daily for pain seeking medical attention after a head injury or if there is blood in the urine/stool; -Recommended  to continue current medication  Gout (Goal: Prevention/ treatment of gout attacks) -Controlled - last gout attack >1 year ago -Current treatment  Allopurinol 10032m 1 tablet daily  Colchicine 0.6mg26m1 tablet every hour until pain has been relieved, then once daily thereafter  -Medications previously tried: n/a  -Counseled on diet and exercise extensively Recommended to continue current medication  Chronic Kidney Disease (Goal: Prevention of disease progression) -Controlled -Last eGFR: 46.87 mL/min (12/31/2020) -Current treatment  Avoidance of nephrotoxic agents / appropriate blood pressure and blood sugar control to prevent further kidney damage -Recommended to continue current medication -closely monitor eGFR and reduce metformin dosage should eGFR <45mL53m  Health Maintenance -Vaccine gaps: Shingles, COVID booster  -Current therapy:  Vitamin D3 2000 units- 1 tablet daily  Vitamin B12 1000mcg32mtablet daily  -Educated on Cost vs benefit of each product must be carefully weighed by individual consumer -Patient is satisfied with current therapy and denies issues -Recommended to continue current medication  Patient Goals/Self-Care Activities Patient will:  - take medications as prescribed check blood pressure daily, document, and provide at future appointments collaborate with provider on medication access solutions target a minimum of 150 minutes of moderate intensity exercise weekly  Follow Up Plan: Telephone follow up appointment with care management team member scheduled for: 4 months The patient has been provided with contact information for the care management team and has been advised to call with any health related questions or concerns.

## 2021-05-11 DIAGNOSIS — I5032 Chronic diastolic (congestive) heart failure: Secondary | ICD-10-CM | POA: Diagnosis not present

## 2021-05-11 DIAGNOSIS — M6281 Muscle weakness (generalized): Secondary | ICD-10-CM | POA: Diagnosis not present

## 2021-05-11 DIAGNOSIS — L03116 Cellulitis of left lower limb: Secondary | ICD-10-CM | POA: Diagnosis not present

## 2021-05-11 DIAGNOSIS — I4892 Unspecified atrial flutter: Secondary | ICD-10-CM | POA: Diagnosis not present

## 2021-05-11 DIAGNOSIS — M1A0710 Idiopathic chronic gout, right ankle and foot, without tophus (tophi): Secondary | ICD-10-CM | POA: Diagnosis not present

## 2021-06-09 ENCOUNTER — Ambulatory Visit: Payer: Medicare HMO | Admitting: Internal Medicine

## 2021-06-10 DIAGNOSIS — M1A0710 Idiopathic chronic gout, right ankle and foot, without tophus (tophi): Secondary | ICD-10-CM | POA: Diagnosis not present

## 2021-06-10 DIAGNOSIS — L03116 Cellulitis of left lower limb: Secondary | ICD-10-CM | POA: Diagnosis not present

## 2021-06-10 DIAGNOSIS — I5032 Chronic diastolic (congestive) heart failure: Secondary | ICD-10-CM | POA: Diagnosis not present

## 2021-06-10 DIAGNOSIS — I4892 Unspecified atrial flutter: Secondary | ICD-10-CM | POA: Diagnosis not present

## 2021-06-10 DIAGNOSIS — M6281 Muscle weakness (generalized): Secondary | ICD-10-CM | POA: Diagnosis not present

## 2021-06-30 ENCOUNTER — Other Ambulatory Visit (INDEPENDENT_AMBULATORY_CARE_PROVIDER_SITE_OTHER): Payer: Medicare HMO

## 2021-06-30 ENCOUNTER — Other Ambulatory Visit: Payer: Self-pay | Admitting: Internal Medicine

## 2021-06-30 DIAGNOSIS — E559 Vitamin D deficiency, unspecified: Secondary | ICD-10-CM

## 2021-06-30 DIAGNOSIS — E669 Obesity, unspecified: Secondary | ICD-10-CM | POA: Diagnosis not present

## 2021-06-30 DIAGNOSIS — N1831 Chronic kidney disease, stage 3a: Secondary | ICD-10-CM

## 2021-06-30 DIAGNOSIS — E1169 Type 2 diabetes mellitus with other specified complication: Secondary | ICD-10-CM | POA: Diagnosis not present

## 2021-06-30 DIAGNOSIS — D649 Anemia, unspecified: Secondary | ICD-10-CM

## 2021-06-30 LAB — LIPID PANEL
Cholesterol: 137 mg/dL (ref 0–200)
HDL: 43.5 mg/dL (ref >39.00)
LDL Cholesterol: 64 mg/dL (ref 0–99)
NonHDL: 93.89
Total CHOL/HDL Ratio: 3
Triglycerides: 151 mg/dL — ABNORMAL HIGH (ref 0.0–149.0)
VLDL: 30.2 mg/dL (ref 0.0–40.0)

## 2021-06-30 LAB — BASIC METABOLIC PANEL
BUN: 39 mg/dL — ABNORMAL HIGH (ref 6–23)
CO2: 22 mEq/L (ref 19–32)
Calcium: 9.8 mg/dL (ref 8.4–10.5)
Chloride: 107 mEq/L (ref 96–112)
Creatinine, Ser: 1.72 mg/dL — ABNORMAL HIGH (ref 0.40–1.50)
GFR: 40.9 mL/min — ABNORMAL LOW (ref >60.00)
Glucose, Bld: 133 mg/dL — ABNORMAL HIGH (ref 70–99)
Potassium: 4.8 mEq/L (ref 3.5–5.1)
Sodium: 139 mEq/L (ref 135–145)

## 2021-06-30 LAB — IBC PANEL
Iron: 75 ug/dL (ref 42–165)
Saturation Ratios: 22.1 % (ref 20.0–50.0)
TIBC: 338.8 ug/dL (ref 250.0–450.0)
Transferrin: 242 mg/dL (ref 212.0–360.0)

## 2021-06-30 LAB — HEPATIC FUNCTION PANEL
ALT: 12 U/L (ref 0–53)
AST: 12 U/L (ref 0–37)
Albumin: 4.1 g/dL (ref 3.5–5.2)
Alkaline Phosphatase: 80 U/L (ref 39–117)
Bilirubin, Direct: 0.1 mg/dL (ref 0.0–0.3)
Total Bilirubin: 0.5 mg/dL (ref 0.2–1.2)
Total Protein: 7.5 g/dL (ref 6.0–8.3)

## 2021-06-30 LAB — CBC WITH DIFFERENTIAL/PLATELET
Basophils Absolute: 0 10*3/uL (ref 0.0–0.1)
Basophils Relative: 0.5 % (ref 0.0–3.0)
Eosinophils Absolute: 0.4 10*3/uL (ref 0.0–0.7)
Eosinophils Relative: 6.1 % — ABNORMAL HIGH (ref 0.0–5.0)
HCT: 35.8 % — ABNORMAL LOW (ref 39.0–52.0)
Hemoglobin: 12 g/dL — ABNORMAL LOW (ref 13.0–17.0)
Lymphocytes Relative: 14.3 % (ref 12.0–46.0)
Lymphs Abs: 0.9 10*3/uL (ref 0.7–4.0)
MCHC: 33.7 g/dL (ref 30.0–36.0)
MCV: 93.8 fl (ref 78.0–100.0)
Monocytes Absolute: 0.5 10*3/uL (ref 0.1–1.0)
Monocytes Relative: 7.9 % (ref 3.0–12.0)
Neutro Abs: 4.7 10*3/uL (ref 1.4–7.7)
Neutrophils Relative %: 71.2 % (ref 43.0–77.0)
Platelets: 214 10*3/uL (ref 150.0–400.0)
RBC: 3.82 Mil/uL — ABNORMAL LOW (ref 4.22–5.81)
RDW: 14 % (ref 11.5–15.5)
WBC: 6.6 10*3/uL (ref 4.0–10.5)

## 2021-06-30 LAB — FERRITIN: Ferritin: 85.1 ng/mL (ref 22.0–322.0)

## 2021-06-30 LAB — VITAMIN D 25 HYDROXY (VIT D DEFICIENCY, FRACTURES): VITD: 19.13 ng/mL — ABNORMAL LOW (ref 30.00–100.00)

## 2021-06-30 LAB — HEMOGLOBIN A1C: Hgb A1c MFr Bld: 6 % (ref 4.6–6.5)

## 2021-06-30 LAB — PHOSPHORUS: Phosphorus: 3.6 mg/dL (ref 2.3–4.6)

## 2021-07-01 LAB — PTH, INTACT AND CALCIUM
Calcium: 9.7 mg/dL (ref 8.6–10.3)
PTH: 22 pg/mL (ref 16–77)

## 2021-07-02 ENCOUNTER — Other Ambulatory Visit: Payer: Self-pay | Admitting: Internal Medicine

## 2021-07-02 NOTE — Telephone Encounter (Signed)
Please refill as per office routine med refill policy (all routine meds to be refilled for 3 mo or monthly (per pt preference) up to one year from last visit, then month to month grace period for 3 mo, then further med refills will have to be denied) ? ?

## 2021-07-04 ENCOUNTER — Ambulatory Visit (INDEPENDENT_AMBULATORY_CARE_PROVIDER_SITE_OTHER): Payer: Medicare HMO | Admitting: Internal Medicine

## 2021-07-04 ENCOUNTER — Ambulatory Visit: Payer: Medicare HMO | Admitting: Internal Medicine

## 2021-07-04 ENCOUNTER — Other Ambulatory Visit: Payer: Self-pay

## 2021-07-04 ENCOUNTER — Encounter: Payer: Self-pay | Admitting: Internal Medicine

## 2021-07-04 VITALS — BP 168/76 | HR 62 | Ht 72.0 in | Wt 366.6 lb

## 2021-07-04 DIAGNOSIS — Z23 Encounter for immunization: Secondary | ICD-10-CM

## 2021-07-04 DIAGNOSIS — N1831 Chronic kidney disease, stage 3a: Secondary | ICD-10-CM

## 2021-07-04 DIAGNOSIS — I10 Essential (primary) hypertension: Secondary | ICD-10-CM | POA: Diagnosis not present

## 2021-07-04 DIAGNOSIS — E559 Vitamin D deficiency, unspecified: Secondary | ICD-10-CM | POA: Diagnosis not present

## 2021-07-04 DIAGNOSIS — E538 Deficiency of other specified B group vitamins: Secondary | ICD-10-CM

## 2021-07-04 DIAGNOSIS — E1169 Type 2 diabetes mellitus with other specified complication: Secondary | ICD-10-CM

## 2021-07-04 DIAGNOSIS — Z0001 Encounter for general adult medical examination with abnormal findings: Secondary | ICD-10-CM

## 2021-07-04 DIAGNOSIS — E7800 Pure hypercholesterolemia, unspecified: Secondary | ICD-10-CM | POA: Diagnosis not present

## 2021-07-04 DIAGNOSIS — M545 Low back pain, unspecified: Secondary | ICD-10-CM

## 2021-07-04 DIAGNOSIS — E669 Obesity, unspecified: Secondary | ICD-10-CM | POA: Diagnosis not present

## 2021-07-04 MED ORDER — CYCLOBENZAPRINE HCL 5 MG PO TABS: 5.0000 mg | ORAL_TABLET | Freq: Three times a day (TID) | ORAL | 1 refills | Status: DC | PRN

## 2021-07-04 MED ORDER — THERA-D 2000 50 MCG (2000 UT) PO TABS: ORAL_TABLET | ORAL | 99 refills | Status: AC

## 2021-07-04 NOTE — Assessment & Plan Note (Addendum)
Last vitamin D Lab Results  Component Value Date   VD25OH 19.13 (L) 06/30/2021   Low, to increase oral replacement to 2000 u qd

## 2021-07-04 NOTE — Patient Instructions (Signed)
Ok to stop the glipizide ER 2.5 ,g  Ok to increase the Vitamin D to 2000 u per day  Please take all new medication as prescribed - the flexeril for the back pain  You will be contacted regarding the referral for:  weight management clinic  Please continue all other medications as before, and refills have been done if requested.  Please have the pharmacy call with any other refills you may need.  Please continue your efforts at being more active, low cholesterol diet, and weight control.  Please keep your appointments with your specialists as you may have planned  Please make an Appointment to return in 6 months, or sooner if needed, also with Lab Appointment for testing done 3-5 days before at the [facility] (so this is for TWO appointments - please see the scheduling desk as you leave)  Due to the ongoing Covid 19 pandemic, our lab now requires an appointment for any labs done at our office.  If you need labs done and do not have an appointment, please call our office ahead of time to schedule before presenting to the lab for your testing.

## 2021-07-04 NOTE — Progress Notes (Addendum)
Patient ID: Gerald Day, male   DOB: [birth year], 65 y.o.   MRN: [id number]        Chief Complaint: follow up obesity, dm, low vit d, ckd, acute right lower back pain       HPI:  Gerald Day is a 65 y.o. male here with c/o 3 days onset right lower back pain with soreness to touch and firmness area tender, mild to mod, constant, worse to stand up, better to sit, nothing else makes better or worse.  Needs left knee TKR but wt prohibitive. Using walker today,uses cane at home.  Ongoing bilat knee pain; tolerable for now, had gel shots recently with Dr Erlinda Hong.  No falls.  Declines PT.  Pt denies chest pain, increased sob or doe, wheezing, orthopnea, PND, increased LE swelling, palpitations, dizziness or syncope.   Pt denies polydipsia, polyuria, or new focal neuro s/s.  Taking vit d 1000 u only.   Pt denies fever, wt loss, night sweats, loss of appetite, or other constitutional symptoms   - in fact wt keeps increasingBP has been < 140/90 at home, but not checked recently  For flu shot, pneumovax today Wt Readings from Last 3 Encounters:  07/04/21 (!) 366 lb 9.6 oz (166.3 kg)  02/06/21 (!) 354 lb (160.6 kg)  01/02/21 (!) 347 lb (157.4 kg)   BP Readings from Last 3 Encounters:  07/04/21 (!) 168/76  02/06/21 130/68  01/02/21 134/70         Past Medical History:  Diagnosis Date   Arthritis    IN KNEES   Cellulitis 10/26/2013   LOWER RT EXTREMITY   CELLULITIS/ABSCESS, LEG 06/27/2007   COLONIC POLYPS    CONGESTIVE HEART FAILURE    DIABETES MELLITUS, TYPE II    DIVERTICULOSIS, COLON    ERECTILE DYSFUNCTION, ORGANIC    GOUT NOS    Headache    HYPERLIPIDEMIA    HYPERTENSION    LOW BACK PAIN    Morbid obesity ([place])    SLEEP APNEA, OBSTRUCTIVE    USES CPAP   Past Surgical History:  Procedure Laterality Date   APPENDECTOMY     CARDIOVERSION N/A 08/02/2020   Procedure: CARDIOVERSION;  Surgeon: Sanda Klein, MD;  Location: [facility];  Service: Cardiovascular;  Laterality: N/A;    FOOT SURGERY     LT   TEE WITHOUT CARDIOVERSION N/A 08/02/2020   Procedure: TRANSESOPHAGEAL ECHOCARDIOGRAM (TEE);  Surgeon: Sanda Klein, MD;  Location[facility] ENDOSCOPY;  Service: Cardiovascular;  Laterality: N/A;   TENOTOMY / FLEXOR TENDON TRANSFER Right 03/15/2015   Procedure: TENOTOMY HT REPAIR/ULCER DEBRIDEMENT/GRAFT PREP/ACELL GRAFT APPLICATION;  Surgeon: Jana Half, DPM;  Location: [facility];  Service: Podiatry;  Laterality: Right;  HALLUX    reports that he has never smoked. He has never used smokeless tobacco. He reports that he does not currently use alcohol after a past usage of about 3.0 - 4.0 standard drinks per week. He reports that he does not use drugs. family history includes Asthma in an other family member; Cardiomyopathy in his father. Allergies  Allergen Reactions   Penicillins Other (See Comments)    Unknown childhood allergic reaction Has patient had a PCN reaction causing immediate rash, facial/tongue/throat swelling, SOB or lightheadedness with hypotension: Unknown Has patient had a PCN reaction causing severe rash involving mucus membranes or skin necrosis: Unknown Has patient had a PCN reaction that required hospitalization: No Has patient had a PCN reaction occurring within the last 10 years: No If all of the  above answers are "NO", then may proceed with Cephalosporin use.    Tizanidine Other (See Comments)    07/26/20: Pt does not recognize drug and does not remember being allergic to it.   Current Outpatient Medications on File Prior to Visit  Medication Sig Dispense Refill   allopurinol (ZYLOPRIM) 100 MG tablet Take 1 tablet (100 mg total) by mouth daily. 90 tablet 3   amLODipine (NORVASC) 10 MG tablet      apixaban (ELIQUIS) 5 MG TABS tablet Take 5 mg by mouth 2 (two) times daily.     atorvastatin (LIPITOR) 10 MG tablet TAKE 1 TABLET (10 MG TOTAL) BY MOUTH DAILY. 90 tablet 3   carvedilol (COREG) 12.5 MG tablet TAKE 1 TABLET TWICE DAILY WITH MEALS 180 tablet 0    cloNIDine (CATAPRES) 0.2 MG tablet Take 0.2 mg by mouth 2 (two) times daily.     colchicine 0.6 MG tablet Take 1 every hour until pain improved or diarrhea, then once daily as needed 40 tablet 5   diltiazem (CARDIZEM CD) 180 MG 24 hr capsule Take 180 mg by mouth daily.     furosemide (LASIX) 80 MG tablet Take 1 tablet (80 mg total) by mouth daily. 90 tablet 3   metFORMIN (GLUCOPHAGE) 500 MG tablet Take 1,000 mg by mouth 2 (two) times daily with a meal. 2 tab by mouth twice per day     potassium chloride SA (KLOR-CON M20) 20 MEQ tablet Take 1 tablet (20 mEq total) by mouth daily. 90 tablet 3   telmisartan (MICARDIS) 80 MG tablet Take 80 mg by mouth daily.     vitamin B-12 (CYANOCOBALAMIN) 1000 MCG tablet Take 1 tablet (1,000 mcg total) by mouth daily. 90 tablet 3   No current facility-administered medications on file prior to visit.        ROS:  All others reviewed and negative.  Objective        PE:  BP (!) 168/76 (BP Location: Left Arm, Patient Position: Sitting, Cuff Size: Large)   Pulse 62   Ht 6' (1.829 m)   Wt (!) 366 lb 9.6 oz (166.3 kg)   SpO2 98%   BMI 49.72 kg/m                 Constitutional: Pt appears in NAD               HENT: Head: NCAT.                Right Ear: External ear normal.                 Left Ear: External ear normal.                Eyes: . Pupils are equal, round, and reactive to light. Conjunctivae and EOM are normal               Nose: without d/c or deformity               Neck: Neck supple. Gross normal ROM               Cardiovascular: Normal rate and regular rhythm.                 Pulmonary/Chest: Effort normal and breath sounds without rales or wheezing.                Abd:  Soft, NT, ND, + BS, no organomegaly  Neurological: Pt is alert. At baseline orientation, motor grossly intact               Skin: Skin is warm. No rashes, no other new lesions, LE edema - trace bilateral chronic;d also has tender right lumbar paravertebraal area  tender spasm               Psychiatric: Pt behavior is normal without agitation   Micro: none  Cardiac tracings I have personally interpreted today:  none  Pertinent Radiological findings (summarize): none   Lab Results  Component Value Date   WBC 6.6 06/30/2021   HGB 12.0 (L) 06/30/2021   HCT 35.8 (L) 06/30/2021   PLT 214.0 06/30/2021   GLUCOSE 133 (H) 06/30/2021   CHOL 137 06/30/2021   TRIG 151.0 (H) 06/30/2021   HDL 43.50 06/30/2021   LDLDIRECT 83.0 12/19/2018   LDLCALC 64 06/30/2021   ALT 12 06/30/2021   AST 12 06/30/2021   NA 139 06/30/2021   K 4.8 06/30/2021   CL 107 06/30/2021   CREATININE 1.72 (H) 06/30/2021   BUN 39 (H) 06/30/2021   CO2 22 06/30/2021   TSH 4.30 12/31/2020   PSA 0.35 12/31/2020   INR 1.4 (H) 07/26/2020   HGBA1C 6.0 06/30/2021   MICROALBUR 159.7 (H) 12/31/2020   Assessment/Plan:  GREENE DIODATO is a 65 y.o. White or Caucasian [1] male with  has a past medical history of Arthritis, Cellulitis (10/26/2013), CELLULITIS/ABSCESS, LEG (06/27/2007), COLONIC POLYPS, CONGESTIVE HEART FAILURE, DIABETES MELLITUS, TYPE II, DIVERTICULOSIS, COLON, ERECTILE DYSFUNCTION, ORGANIC, GOUT NOS, Headache, HYPERLIPIDEMIA, HYPERTENSION, LOW BACK PAIN, Morbid obesity ([place]), and SLEEP APNEA, OBSTRUCTIVE.  Vitamin D deficiency Last vitamin D Lab Results  Component Value Date   VD25OH 19.13 (L) 06/30/2021   Low, to increase oral replacement to 2000 u qd   Diabetes mellitus type 2 in obese [facility]) Lab Results  Component Value Date   HGBA1C 6.0 06/30/2021   Excellent improvement, now needs to d/c glyburide due to overcontrolled and plans to lose wt   Hyperlipidemia Lab Results  Component Value Date   LDLCALC 64 06/30/2021   Stable, pt to continue current statin liptior 10   Essential hypertension BP Readings from Last 3 Encounters:  07/04/21 (!) 168/76  02/06/21 130/68  01/02/21 134/70   Uncontrolled today, pt state at home < 140/90,, pt to continue  medical treatment norvasc, coreg, clonidine, micardis, cardizem   Stage 3a chronic kidney disease ([place]) Lab Results  Component Value Date   CREATININE 1.72 (H) 06/30/2021   Stable overall, cont to avoid nephrotoxins   Obesity (BMI 35.0-39.9 without comorbidity) Ok for referral wt management, ? monjouro candidate  Low back pain Mild to mod, c/w msk spasm, for flexeril prn,  to f/u any worsening symptoms or concerns  Followup: No follow-ups on file.  Cathlean Cower, MD 07/06/2021 8:44 PM [facility]

## 2021-07-06 ENCOUNTER — Encounter: Payer: Self-pay | Admitting: Internal Medicine

## 2021-07-06 DIAGNOSIS — M545 Low back pain, unspecified: Secondary | ICD-10-CM | POA: Insufficient documentation

## 2021-07-06 DIAGNOSIS — E669 Obesity, unspecified: Secondary | ICD-10-CM | POA: Insufficient documentation

## 2021-07-06 NOTE — Assessment & Plan Note (Addendum)
Lab Results  Component Value Date   HGBA1C 6.0 06/30/2021   Excellent improvement, now needs to d/c glyburide due to overcontrolled and plans to lose wt

## 2021-07-06 NOTE — Assessment & Plan Note (Signed)
BP Readings from Last 3 Encounters:  07/04/21 (!) 168/76  02/06/21 130/68  01/02/21 134/70   Uncontrolled today, pt state at home < 140/90,, pt to continue medical treatment norvasc, coreg, clonidine, micardis, cardizem

## 2021-07-06 NOTE — Assessment & Plan Note (Signed)
Lab Results  Component Value Date   LDLCALC 64 06/30/2021   Stable, pt to continue current statin liptior 10

## 2021-07-06 NOTE — Addendum Note (Signed)
Addended by: Biagio Borg on: 07/06/2021 08:45 PM   Modules accepted: Orders

## 2021-07-06 NOTE — Assessment & Plan Note (Addendum)
[place] for referral wt management, ? monjouro candidate

## 2021-07-06 NOTE — Assessment & Plan Note (Signed)
Lab Results  Component Value Date   CREATININE 1.72 (H) 06/30/2021   Stable overall, cont to avoid nephrotoxins

## 2021-07-06 NOTE — Assessment & Plan Note (Signed)
Mild to mod, c/w msk spasm, for flexeril prn,  to f/u any worsening symptoms or concerns

## 2021-07-07 ENCOUNTER — Other Ambulatory Visit: Payer: Self-pay | Admitting: Internal Medicine

## 2021-07-07 NOTE — Telephone Encounter (Signed)
Please refill as per office routine med refill policy (all routine meds to be refilled for 3 mo or monthly (per pt preference) up to one year from last visit, then month to month grace period for 3 mo, then further med refills will have to be denied) ? ?

## 2021-07-08 DIAGNOSIS — G4733 Obstructive sleep apnea (adult) (pediatric): Secondary | ICD-10-CM | POA: Diagnosis not present

## 2021-07-09 ENCOUNTER — Other Ambulatory Visit: Payer: Self-pay | Admitting: Internal Medicine

## 2021-07-09 ENCOUNTER — Other Ambulatory Visit: Payer: Self-pay

## 2021-07-09 MED ORDER — AMLODIPINE BESYLATE 10 MG PO TABS: 10.0000 mg | ORAL_TABLET | Freq: Every day | ORAL | 3 refills | Status: DC

## 2021-07-09 NOTE — Telephone Encounter (Signed)
Please refill as per office routine med refill policy (all routine meds to be refilled for 3 mo or monthly (per pt preference) up to one year from last visit, then month to month grace period for 3 mo, then further med refills will have to be denied) ? ?

## 2021-07-23 ENCOUNTER — Other Ambulatory Visit: Payer: Self-pay | Admitting: Internal Medicine

## 2021-07-23 DIAGNOSIS — E559 Vitamin D deficiency, unspecified: Secondary | ICD-10-CM

## 2021-07-23 DIAGNOSIS — E1169 Type 2 diabetes mellitus with other specified complication: Secondary | ICD-10-CM

## 2021-07-23 DIAGNOSIS — E538 Deficiency of other specified B group vitamins: Secondary | ICD-10-CM

## 2021-07-30 ENCOUNTER — Telehealth: Payer: Self-pay

## 2021-07-30 NOTE — Telephone Encounter (Signed)
[facility] called to request a signature from Dr. Naaman Plummer for CPAP supplies. Called to inform them that Dr. Naaman Plummer does not write for those and gave them the number to the PCP office.

## 2021-08-06 ENCOUNTER — Other Ambulatory Visit: Payer: Self-pay | Admitting: Internal Medicine

## 2021-08-06 NOTE — Telephone Encounter (Signed)
Please refill as per office routine med refill policy (all routine meds to be refilled for 3 mo or monthly (per pt preference) up to one year from last visit, then month to month grace period for 3 mo, then further med refills will have to be denied) ? ?

## 2021-08-29 ENCOUNTER — Ambulatory Visit (INDEPENDENT_AMBULATORY_CARE_PROVIDER_SITE_OTHER): Payer: Medicare HMO

## 2021-08-29 DIAGNOSIS — E1169 Type 2 diabetes mellitus with other specified complication: Secondary | ICD-10-CM

## 2021-08-29 DIAGNOSIS — I10 Essential (primary) hypertension: Secondary | ICD-10-CM

## 2021-08-29 NOTE — Patient Instructions (Signed)
Visit Information  Following are the goals we discussed today:   Manage My Medication  Timeframe:  Long-Range Goal Priority:  High Start Date:   05/06/2021                          Expected End Date:  11/06/2021                     Follow Up Date 10/08/2021   - check blood pressure daily - choose a place to take my blood pressure (home, clinic or office, retail store) - write blood pressure results in a log or diary    Why is this important?   You won't feel high blood pressure, but it can still hurt your blood vessels.  High blood pressure can cause heart or kidney problems. It can also cause a stroke.  Making lifestyle changes like losing a little weight or eating less salt will help.  Checking your blood pressure at home and at different times of the day can help to control blood pressure.  If the doctor prescribes medicine remember to take it the way the doctor ordered.  Call the office if you cannot afford the medicine or if there are questions about it.  Plan: Telephone follow up appointment with care management team member scheduled for:  6 weeks  The patient has been provided with contact information for the care management team and has been advised to call with any health related questions or concerns.   Tomasa Blase, PharmD Clinical Pharmacist, [facility]   Please call the care guide team at [phone] if you need to cancel or reschedule your appointment.   The patient verbalized understanding of instructions, educational materials, and care plan provided today and declined offer to receive copy of patient instructions, educational materials, and care plan.

## 2021-08-29 NOTE — Progress Notes (Signed)
Chronic Care Management Pharmacy Note  08/29/2021 Name:  Gerald Day MRN:  [id number] DOB:  November 19, 1955  Summary: -Patient reports he has stopped using cyclobenzaprine as it was not effective for his back pain -Stopped glipizide as directed, no issues since stopping  -Reports that he has stopped taking diltiazem - at the advice of the pharmacist from [place] as he was already taking amlodipine  -Reports that he is unsure of recent blood pressure - notes that he check infrequently, could not recall BP average - ranging possibly from 130-160/60  Recommendations/Changes made from today's visit: -Recommended for patient to start checking blood pressure 3 times weekly and recording blood pressures to discuss with follow up  - patient to reach out should BP average >140/90  -Recommended for patient to reduce metformin dosage to 559m BID due to eGFR <45 mL/min on latest lab work -Patient to follow up with pcp for referral to ortho/ spine specialist should back pain continue to be an issue for him   Subjective: MDONOVON MICHELETTIis an 65y.o. year old male who is a primary patient of JJenny Day JHunt Oris MD.  The CCM team was consulted for assistance with disease management and care coordination needs.    Engaged with patient by telephone for follow up visit in response to provider referral for pharmacy case management and/or care coordination services.   Consent to Services:  The patient was given the following information about Chronic Care Management services today, agreed to services, and gave verbal consent: 1. CCM service includes personalized support from designated clinical staff supervised by the primary care provider, including individualized plan of care and coordination with other care providers 2. 24/7 contact phone numbers for assistance for urgent and routine care needs. 3. Service will only be billed when office clinical staff spend 20 minutes or more in a month to  coordinate care. 4. Only one practitioner may furnish and bill the service in a calendar month. 5.The patient may stop CCM services at any time (effective at the end of the month) by phone call to the office staff. 6. The patient will be responsible for cost sharing (co-pay) of up to 20% of the service fee (after annual deductible is met). Patient agreed to services and consent obtained.  Patient Care Team: [name] MD as PCP - [name] MD as PCP - Cardiology (Cardiology) STomasa Blase [facility] (Pharmacist)  Recent office visits:  07/04/2021 - Dr. JJenny Day- c/o of 3 day right lower back pain - start flexeril for back pain - to stop glipizide XR 2.545m increase vitamin D to 2000 units    Recent consult visits:  None since last visit    Hospital visits:  None in previous 6 months  Objective:  Lab Results  Component Value Date   CREATININE 1.72 (H) 06/30/2021   BUN 39 (H) 06/30/2021   GFR 40.90 (L) 06/30/2021   GFRNONAA 43 (L) 10/02/2020   GFRAA 49 (L) 10/02/2020   NA 139 06/30/2021   K 4.8 06/30/2021   CALCIUM 9.8 06/30/2021   CALCIUM 9.7 06/30/2021   CO2 22 06/30/2021   GLUCOSE 133 (H) 06/30/2021    Lab Results  Component Value Date/Time   HGBA1C 6.0 06/30/2021 08:15 AM   HGBA1C 6.3 12/31/2020 07:28 AM   GFR 40.90 (L) 06/30/2021 08:15 AM   GFR 46.87 (L) 12/31/2020 07:28 AM   MICROALBUR 159.7 (H) 12/31/2020 07:28 AM   MICROALBUR 312.3 (H) 12/18/2019 11:39  AM    Last diabetic Eye exam:  No results found for: HMDIABEYEEXA  Last diabetic Foot exam:  No results found for: HMDIABFOOTEX   Lab Results  Component Value Date   CHOL 137 06/30/2021   HDL 43.50 06/30/2021   LDLCALC 64 06/30/2021   LDLDIRECT 83.0 12/19/2018   TRIG 151.0 (H) 06/30/2021   CHOLHDL 3 06/30/2021    Hepatic Function Latest Ref Rng & Units 06/30/2021 12/31/2020 07/26/2020  Total Protein 6.0 - 8.3 g/dL 7.5 7.2 6.9  Albumin 3.5 - 5.2 g/dL 4.1 3.7 2.7(L)  AST 0 - 37  U/L '12 12 21  ' ALT 0 - 53 U/L '12 14 22  ' Alk Phosphatase 39 - 117 U/L 80 91 87  Total Bilirubin 0.2 - 1.2 mg/dL 0.5 0.5 1.3(H)  Bilirubin, Direct 0.0 - 0.3 mg/dL 0.1 0.1 0.4(H)    Lab Results  Component Value Date/Time   TSH 4.30 12/31/2020 07:28 AM   TSH 2.23 12/18/2019 11:15 AM    CBC Latest Ref Rng & Units 06/30/2021 12/31/2020 09/01/2020  WBC 4.0 - 10.5 K/uL 6.6 4.5 12.3(H)  Hemoglobin 13.0 - 17.0 g/dL 12.0(L) 11.3(L) 8.9(L)  Hematocrit 39.0 - 52.0 % 35.8(L) 32.8(L) 28.7(L)  Platelets 150.0 - 400.0 K/uL 214.0 186.0 321    Lab Results  Component Value Date/Time   VD25OH 19.13 (L) 06/30/2021 08:15 AM   VD25OH 27.94 (L) 12/31/2020 07:28 AM    Clinical ASCVD: No  The 10-year ASCVD risk score (Arnett DK, et al., 2019) is: 36.5%   Values used to calculate the score:     Age: 65 years     Sex: Male     Is Non-Hispanic African American: No     Diabetic: Yes     Tobacco smoker: No     Systolic Blood Pressure: 832 mmHg     Is BP treated: Yes     HDL Cholesterol: 43.5 mg/dL     Total Cholesterol: 137 mg/dL    Depression screen [facility] 2/9 07/04/2021 01/02/2021 01/02/2021  Decreased Interest 0 0 0  Down, Depressed, Hopeless 0 0 0  PHQ - 2 Score 0 0 0  Some recent data might be hidden     Social History   Tobacco Use  Smoking Status Never  Smokeless Tobacco Never   BP Readings from Last 3 Encounters:  07/04/21 (!) 168/76  02/06/21 130/68  01/02/21 134/70   Pulse Readings from Last 3 Encounters:  07/04/21 62  02/06/21 64  01/02/21 70   Wt Readings from Last 3 Encounters:  07/04/21 (!) 366 lb 9.6 oz (166.3 kg)  02/06/21 (!) 354 lb (160.6 kg)  01/02/21 (!) 347 lb (157.4 kg)   BMI Readings from Last 3 Encounters:  07/04/21 49.72 kg/m  02/06/21 48.01 kg/m  01/02/21 48.40 kg/m    Assessment/Interventions: Review of patient past medical history, allergies, medications, health status, including review of consultants reports, laboratory and other test data, was  performed as part of comprehensive evaluation and provision of chronic care management services.   SDOH:  (Social Determinants of Health) assessments and interventions performed: Yes  SDOH Screenings   Alcohol Screen: Not on file  Depression (PHQ2-9): Low Risk    PHQ-2 Score: 0  Financial Resource Strain: Low Risk    Difficulty of Paying Living Expenses: Not very hard  Food Insecurity: Not on file  Housing: Not on file  Physical Activity: Not on file  Social Connections: Not on file  Stress: Not on file  Tobacco Use: Low  Risk    Smoking Tobacco Use: Never   Smokeless Tobacco Use: Never   Passive Exposure: Not on file  Transportation Needs: Not on file    CCM Care Plan  Allergies  Allergen Reactions   Penicillins Other (See Comments)    Unknown childhood allergic reaction Has patient had a PCN reaction causing immediate rash, facial/tongue/throat swelling, SOB or lightheadedness with hypotension: Unknown Has patient had a PCN reaction causing severe rash involving mucus membranes or skin necrosis: Unknown Has patient had a PCN reaction that required hospitalization: No Has patient had a PCN reaction occurring within the last 10 years: No If all of the above answers are "NO", then may proceed with Cephalosporin use.    Tizanidine Other (See Comments)    07/26/20: Pt does not recognize drug and does not remember being allergic to it.    Medications Reviewed Today     Reviewed by Tomasa Blase, [facility] (Pharmacist) on 08/29/21 at 1129  Med List Status: <None>   Medication Order Taking? Sig Documenting Provider Last Dose Status Informant  allopurinol (ZYLOPRIM) 100 MG tablet [id number] Yes Take 1 tablet (100 mg total) by mouth daily. Biagio Borg, MD Taking Active   amLODipine (NORVASC) 10 MG tablet [id number] Yes Take 1 tablet (10 mg total) by mouth daily. Biagio Borg, MD Taking Active   apixaban (ELIQUIS) 5 MG TABS tablet [id number] Yes Take 5 mg by mouth 2 (two) times  daily. [provider] Taking Active   atorvastatin (LIPITOR) 10 MG tablet [id number] Yes TAKE 1 TABLET EVERY DAY Biagio Borg, MD Taking Active   carvedilol (COREG) 12.5 MG tablet [id number] Yes TAKE 1 TABLET TWICE DAILY WITH MEALS Biagio Borg, MD Taking Active   Cholecalciferol (THERA-D 2000) 50 MCG (2000 UT) TABS [id number] Yes 1 tab by mouth once daily Biagio Borg, MD Taking Active   cloNIDine (CATAPRES) 0.2 MG tablet [id number] Yes Take 0.2 mg by mouth 2 (two) times daily. [provider] Taking Active   colchicine 0.6 MG tablet [id number] Yes Take 1 every hour until pain improved or diarrhea, then once daily as needed Biagio Borg, MD Taking Active   diltiazem (CARDIZEM CD) 180 MG 24 hr capsule [id number] No Take 180 mg by mouth daily.  Patient not taking: Reported on 08/29/2021   [provider] Not Taking Active   furosemide (LASIX) 80 MG tablet [id number] Yes Take 1 tablet (80 mg total) by mouth daily. Biagio Borg, MD Taking Active   metFORMIN (GLUCOPHAGE) 500 MG tablet [id number] Yes Take 500 mg by mouth 2 (two) times daily with a meal. eGFR <45 mL/min [provider] Taking Active   potassium chloride SA (KLOR-CON) 20 MEQ tablet [id number] Yes TAKE 1 TABLET EVERY DAY Biagio Borg, MD Taking Active   telmisartan (MICARDIS) 80 MG tablet [id number] Yes TAKE 1 TABLET EVERY DAY Biagio Borg, MD Taking Active   vitamin B-12 (CYANOCOBALAMIN) 1000 MCG tablet [id number] Yes Take 1 tablet (1,000 mcg total) by mouth daily. Biagio Borg, MD Taking Active Nursing Home Medication Administration Guide (MAG)            Patient Active Problem List   Diagnosis Date Noted   Obesity (BMI 35.0-39.9 without comorbidity) 07/06/2021   Low back pain 07/06/2021   Microhematuria 01/02/2021   Right knee pain 10/26/2020   Anticoagulated 08/28/2020   Edema leg 08/28/2020   PAF (paroxysmal atrial fibrillation) ([place])    Sepsis due to cellulitis ([place]) 07/26/2020  Stage  3a chronic kidney disease ([place]) 07/04/2020   Vitamin D deficiency 12/28/2019   B12 deficiency 12/28/2019   Glaucoma 12/22/2018   Radiculopathy, lumbar region 01/17/2018   Constipation 06/18/2017   AKI (acute kidney injury) ([place]) 06/02/2017   Chronic midline low back pain without sciatica 05/14/2017   Proteinuria 11/22/2016   Skin lesion 11/12/2015   Preop exam for internal medicine 01/16/2015   Cellulitis and abscess of leg 10/26/2013   DM foot ulcer ([place]) 10/23/2013   Encounter for well adult exam with abnormal findings 07/05/2011   History of colonic polyps 09/05/2009   COLONIC POLYPS 03/14/2008   DIVERTICULOSIS, COLON 03/14/2008   Hyperlipidemia 06/27/2007   CELLULITIS/ABSCESS, LEG 06/27/2007   Diabetes mellitus type 2 in obese ([place]) 05/21/2007   GOUT NOS 05/21/2007   Morbid obesity ([place]) 05/21/2007   ERECTILE DYSFUNCTION 05/21/2007   OSA (obstructive sleep apnea) 05/21/2007   Essential hypertension 05/21/2007    Immunization History  Administered Date(s) Administered   Fluad Quad(high Dose 65+) 07/04/2021   PFIZER(Purple Top)SARS-COV-2 Vaccination 02/26/2020, 03/27/2020   Pneumococcal Polysaccharide-23 10/27/2013, 07/04/2021   Td 08/28/2008   Tdap 12/22/2018    Conditions to be addressed/monitored:  Hypertension, Hyperlipidemia, Diabetes, Atrial Fibrillation, Chronic Kidney Disease, and Gout  Care Plan : CCM Care Plan  Updates made by Tomasa Blase, RPH since 08/29/2021 12:00 AM     Problem: HTN, Afib, DM2, CKD, HLD, Gout   Priority: High  Onset Date: 05/06/2021     Long-Range Goal: Disease Management   Start Date: 05/06/2021  Expected End Date: 08/29/2022  This Visit's Progress: Not on track  Recent Progress: On track  Priority: High  Note:   Current Barriers:  Unable to independently monitor therapeutic efficacy  Pharmacist Clinical Goal(s):  Patient will achieve adherence to monitoring guidelines and medication adherence to achieve therapeutic  efficacy maintain control of Blood pressure, LDL, Blood sugars, and gout as evidenced by BP logs, next lipid panel, next A1c, frequency of gout attacks  through collaboration with PharmD and provider.   Interventions: 1:1 collaboration with Biagio Borg, MD regarding development and update of comprehensive plan of care as evidenced by provider attestation and co-signature Inter-disciplinary care team collaboration (see longitudinal plan of care) Comprehensive medication review performed; medication list updated in electronic medical record  Hypertension (BP goal <130/80) -Controlled -Current treatment: Carvedilol 12.12m - 1 tablet twice daily  Clonidine 0.254m- 1 tablet twice daily  Diltiazem 18064m 1 capsule daily  - not currently taking  Amlodipine 32m47m1 tablet daily  Telmisartan 80mg97m tablet daily  Furosemide 80mg 27mtablet daily  Potassium Chloride 20mEq 47mtablet daily Lab Results  Component Value Date   K 4.8 06/30/2021  -Medications previously tried: losartan -Current home readings: patient reports that blood pressure has been elevated at time, unsure of a true average as he has not been checking very often - BP has been 150/60 at times that he could remember - patient agreeable to start recording HR as well so that adjustments can be made to medications if necessary  BP Readings from Last 3 Encounters:  07/04/21 (!) 168/76  02/06/21 130/68  01/02/21 134/70  -Current dietary habits: eats sodium reduced diet  -Current exercise habits: none at this time - plans to get back into a silver sneakers - able to swim -Denies hypotensive/hypertensive symptoms -Educated on BP goals and benefits of medications for prevention of heart attack, stroke and kidney damage; Daily salt intake goal <  2300 mg; Exercise goal of 150 minutes per week; Importance of home blood pressure monitoring; Proper BP monitoring technique; Symptoms of hypotension and importance of maintaining adequate  hydration; -Counseled to monitor BP at home daily , document, and provide log at future appointments -Counseled on diet and exercise extensively Recommended to continue current medication  Hyperlipidemia: (LDL goal < 70) -Controlled Lab Results  Component Value Date   LDLCALC 64 06/30/2021  -Current treatment: Atorvastatin 35m - 1 tablet daily  -Medications previously tried: pravastatin   -Current dietary patterns: reports to eating more lean meats such as t[place] chicken, trying to avoid foods high in cholesterol -Current exercise habits:  none at this time - plans to get back into a silver sneakers - able to swim -Educated on Cholesterol goals;  Benefits of statin for ASCVD risk reduction; Importance of limiting foods high in cholesterol; Exercise goal of 150 minutes per week; -Counseled on diet and exercise extensively Recommended to continue current medication  Diabetes (A1c goal <7%) -Controlled Lab Results  Component Value Date   HGBA1C 6.0 06/30/2021  -Current medications: Metformin 5028m- 2 tablets twice daily  -Medications previously tried: noContractorjaTongalevemir, metformin XR, glipizide XL   -Current home glucose readings fasting glucose: does not currently check -Denies hypoglycemic/hyperglycemic symptoms -Current meal patterns:  breakfast: eggs and grits, apple, cereal  lunch: sandwich, fruit, vegetables   dinner: tuKuwaitchicken, vegetable, carb snacks: fruits drinks: water, iced tea (sweet/unsweet) - varies, seldom drinks soda -Current exercise: none at this time - plans to get back into a silver sneakers - able to swim -Educated on A1c and blood sugar goals; Complications of diabetes including kidney damage, retinal damage, and cardiovascular disease; Exercise goal of 150 minutes per week; Benefits of weight loss; Prevention and management of hypoglycemic episodes; -Counseled to check feet daily and get yearly eye exams -Counseled on diet and  exercise extensively -Recommended for patient to reduce metformin to 50010mID  - eGFR <45 mL/min on last check   Atrial Fibrillation (Goal: prevent stroke and major bleeding) -Controlled -CHADSVASC: 3 -Current treatment: Rate control: carvedilol 12.5mg82mice daily, clonidine 0.2mg 77mce daily, amlodipine 10mg 67my  Anticoagulation: eliquis 5mg - 62mablet twice daily  -Medications previously tried: diltiazem  -Home BP and HR readings: see above  -Counseled on increased risk of stroke due to Afib and benefits of anticoagulation for stroke prevention; importance of adherence to anticoagulant exactly as prescribed; bleeding risk associated with eliquis and importance of self-monitoring for signs/symptoms of bleeding; avoidance of NSAIDs due to increased bleeding risk with anticoagulants; patient reports that he has been taking ibuprofen 200mg - 68mblet daily for pain, advised for use of acetaminopehn - up to 2000mg dai32mnd diclofenac 1% gel applied 4 times daily for pain seeking medical attention after a head injury or if there is blood in the urine/stool; -Recommended to continue current medication  Gout (Goal: Prevention/ treatment of gout attacks) -Controlled - last gout attack >1 year ago -Current treatment  Allopurinol 100mg - 1 68met daily  Colchicine 0.6mg - 1 ta30mt every hour until pain has been relieved, then once daily thereafter  -Medications previously tried: n/a  -Counseled on diet and exercise extensively Recommended to continue current medication  Chronic Kidney Disease (Goal: Prevention of disease progression) -Controlled -Last eGFR: 40 mL/min -Current treatment  Avoidance of nephrotoxic agents / appropriate blood pressure and blood sugar control to prevent further kidney damage -Recommended to reduce metformin to 500mg BID du50m decrease in  kidney function  Health Maintenance -Vaccine gaps: Shingles, COVID booster  -Current therapy:  Vitamin D3 2000 units- 1  tablet daily  Vitamin B12 1059mg- 1 tablet daily  -Educated on Cost vs benefit of each product must be carefully weighed by individual consumer -Patient is satisfied with current therapy and denies issues -Recommended to continue current medication  Patient Goals/Self-Care Activities Patient will:  - take medications as prescribed check blood pressure daily, document, and provide at future appointments collaborate with provider on medication access solutions target a minimum of 150 minutes of moderate intensity exercise weekly  Follow Up Plan: Telephone follow up appointment with care management team member scheduled for: 4 months The patient has been provided with contact information for the care management team and has been advised to call with any health related questions or concerns.     Medication Assistance: None required.  Patient affirms current coverage meets needs.  Patient's preferred pharmacy is:  [id number] [place], [place] [address] [id number]: [phone]: [phone] [address]one: [phone]: [phone]  Uses pill box? No - keeps all of his medications in one box and keeps in order of when he takes in the day - pill pox is too small for him to use  Pt endorses 100% compliance  Care Plan and Follow Up Patient Decision:  Patient agrees to Care Plan and Follow-up.  Plan: Telephone follow up appointment with care management team member scheduled for:  4 months and The patient has been provided with contact information for the care management team and has been advised to call with any health related questions or concerns.   DaTomasa BlasePharmD Clinical Pharmacist, [place]

## 2021-09-06 DIAGNOSIS — E1169 Type 2 diabetes mellitus with other specified complication: Secondary | ICD-10-CM | POA: Diagnosis not present

## 2021-09-06 DIAGNOSIS — I10 Essential (primary) hypertension: Secondary | ICD-10-CM | POA: Diagnosis not present

## 2021-09-06 DIAGNOSIS — E669 Obesity, unspecified: Secondary | ICD-10-CM | POA: Diagnosis not present

## 2021-11-03 ENCOUNTER — Other Ambulatory Visit: Payer: Self-pay | Admitting: Internal Medicine

## 2021-11-17 ENCOUNTER — Other Ambulatory Visit: Payer: Self-pay | Admitting: Internal Medicine

## 2021-11-17 NOTE — Telephone Encounter (Signed)
Please refill as per office routine med refill policy (all routine meds to be refilled for 3 mo or monthly (per pt preference) up to one year from last visit, then month to month grace period for 3 mo, then further med refills will have to be denied) ? ?

## 2021-11-20 ENCOUNTER — Ambulatory Visit (INDEPENDENT_AMBULATORY_CARE_PROVIDER_SITE_OTHER): Payer: Medicare HMO

## 2021-11-20 NOTE — Patient Instructions (Signed)
Visit Information ? ?Following are the goals we discussed today:  ? ?Track and Manage My Blood Pressure  ? ?Timeframe:  Long-Range Goal ?Priority:  High ?Start Date:   05/06/2021                          ?Expected End Date:  11/06/2021                    ? ?Follow Up Date June 2023 ?  ?- check blood pressure daily ?- choose a place to take my blood pressure (home, clinic or office, retail store) ?- write blood pressure results in a log or diary  ?  ?Why is this important?   ?You won't feel high blood pressure, but it can still hurt your blood vessels.  ?High blood pressure can cause heart or kidney problems. It can also cause a stroke.  ?Making lifestyle changes like losing a little weight or eating less salt will help.  ?Checking your blood pressure at home and at different times of the day can help to control blood pressure.  ?If the doctor prescribes medicine remember to take it the way the doctor ordered.  ?Call the office if you cannot afford the medicine or if there are questions about it.   ? ?Plan: Telephone follow up appointment with care management team member scheduled for:  3 months  ?The patient has been provided with contact information for the care management team and has been advised to call with any health related questions or concerns.  ? ?Tomasa Blase, PharmD ?Clinical Pharmacist, [place]  ? ?Please call the care guide team at [phone] if you need to cancel or reschedule your appointment.  ? ?The patient verbalized understanding of instructions, educational materials, and care plan provided today and declined offer to receive copy of patient instructions, educational materials, and care plan.  ? ?

## 2021-11-20 NOTE — Progress Notes (Signed)
? ?Chronic Care Management ?Pharmacy Note ? ?11/20/2021 ?Name:  Gerald Day MRN:  [id number] DOB:  March 29, 1956 ? ?Summary: ?-Patient reports compliance to current medications, denies any issues with current medications  ?-Reports that BP has been averaging 150-160/70's when he has checked at home ? ?Recommendations/Changes made from today's visit: ?-Recommending for addition of hydralazine 75m BID to help better control BP ?-Patient to reduce caffeine intake to improve BP control  ?-Patient has follow up with PCP next month to review BP  ? ?Subjective: ?MCEDRIC MCCLAINEis an 66y.o. year old male who is a primary patient of [name] JHunt Oris MD.  The CCM team was consulted for assistance with disease management and care coordination needs.   ? ?Engaged with patient by telephone for follow up visit in response to provider referral for pharmacy case management and/or care coordination services.  ? ?Consent to Services:  ?The patient was given the following information about Chronic Care Management services today, agreed to services, and gave verbal consent: 1. CCM service includes personalized support from designated clinical staff supervised by the primary care provider, including individualized plan of care and coordination with other care providers 2. 24/7 contact phone numbers for assistance for urgent and routine care needs. 3. Service will only be billed when office clinical staff spend 20 minutes or more in a month to coordinate care. 4. Only one practitioner may furnish and bill the service in a calendar month. 5.The patient may stop CCM services at any time (effective at the end of the month) by phone call to the office staff. 6. The patient will be responsible for cost sharing (co-pay) of up to 20% of the service fee (after annual deductible is met). Patient agreed to services and consent obtained. ? ?Patient Care Team: ?[name] MD as PCP - General ?HMinus Breeding MD as PCP - Cardiology  (Cardiology) ?STomasa Blase [facility] Pharmacist (Pharmacist) ? ?Recent office visits:  ?None since last visit ?  ?Recent consult visits:  ?None since last visit  ?  ?Hospital visits:  ?None in previous 6 months ? ?Objective: ? ?Lab Results  ?Component Value Date  ? CREATININE 1.72 (H) 06/30/2021  ? BUN 39 (H) 06/30/2021  ? GFR 40.90 (L) 06/30/2021  ? GFRNONAA 43 (L) 10/02/2020  ? GFRAA 49 (L) 10/02/2020  ? NA 139 06/30/2021  ? K 4.8 06/30/2021  ? CALCIUM 9.8 06/30/2021  ? CALCIUM 9.7 06/30/2021  ? CO2 22 06/30/2021  ? GLUCOSE 133 (H) 06/30/2021  ? ? ?Lab Results  ?Component Value Date/Time  ? HGBA1C 6.0 06/30/2021 08:15 AM  ? HGBA1C 6.3 12/31/2020 07:28 AM  ? GFR 40.90 (L) 06/30/2021 08:15 AM  ? GFR 46.87 (L) 12/31/2020 07:28 AM  ? MICROALBUR 159.7 (H) 12/31/2020 07:28 AM  ? MICROALBUR 312.3 (H) 12/18/2019 11:39 AM  ?  ?Last diabetic Eye exam:  ?No results found for: HMDIABEYEEXA  ?Last diabetic Foot exam:  ?No results found for: HMDIABFOOTEX  ? ?Lab Results  ?Component Value Date  ? CHOL 137 06/30/2021  ? HDL 43.50 06/30/2021  ? LKetchum64 06/30/2021  ? LDLDIRECT 83.0 12/19/2018  ? TRIG 151.0 (H) 06/30/2021  ? CHOLHDL 3 06/30/2021  ? ? ?Hepatic Function Latest Ref Rng & Units 06/30/2021 12/31/2020 07/26/2020  ?Total Protein 6.0 - 8.3 g/dL 7.5 7.2 6.9  ?Albumin 3.5 - 5.2 g/dL 4.1 3.7 2.7(L)  ?AST 0 - 37 U/L '12 12 21  ' ?ALT 0 - 53 U/L '12 14 22  ' ?  Alk Phosphatase 39 - 117 U/L 80 91 87  ?Total Bilirubin 0.2 - 1.2 mg/dL 0.5 0.5 1.3(H)  ?Bilirubin, Direct 0.0 - 0.3 mg/dL 0.1 0.1 0.4(H)  ? ? ?Lab Results  ?Component Value Date/Time  ? TSH 4.30 12/31/2020 07:28 AM  ? TSH 2.23 12/18/2019 11:15 AM  ? ? ?CBC Latest Ref Rng & Units 06/30/2021 12/31/2020 09/01/2020  ?WBC 4.0 - 10.5 K/uL 6.6 4.5 12.3(H)  ?Hemoglobin 13.0 - 17.0 g/dL 12.0(L) 11.3(L) 8.9(L)  ?Hematocrit 39.0 - 52.0 % 35.8(L) 32.8(L) 28.7(L)  ?Platelets 150.0 - 400.0 K/uL 214.0 186.0 321  ? ? ?Lab Results  ?Component Value Date/Time  ? VD25OH 19.13 (L) 06/30/2021  08:15 AM  ? VD25OH 27.94 (L) 12/31/2020 07:28 AM  ? ? ?Clinical ASCVD: No  ?The 10-year ASCVD risk score (Arnett DK, et al., 2019) is: 36.5% ?  Values used to calculate the score: ?    Age: 66 years ?    Sex: Male ?    Is Non-Hispanic African American: No ?    Diabetic: Yes ?    Tobacco smoker: No ?    Systolic Blood Pressure: 098 mmHg ?    Is BP treated: Yes ?    HDL Cholesterol: 43.5 mg/dL ?    Total Cholesterol: 137 mg/dL   ? ?Depression screen [facility] 2/9 07/04/2021 01/02/2021 01/02/2021  ?Decreased Interest 0 0 0  ?Down, Depressed, Hopeless 0 0 0  ?PHQ - 2 Score 0 0 0  ?Some recent data might be hidden  ?  ? ?Social History  ? ?Tobacco Use  ?Smoking Status Never  ?Smokeless Tobacco Never  ? ?BP Readings from Last 3 Encounters:  ?07/04/21 (!) 168/76  ?02/06/21 130/68  ?01/02/21 134/70  ? ?Pulse Readings from Last 3 Encounters:  ?07/04/21 62  ?02/06/21 64  ?01/02/21 70  ? ?Wt Readings from Last 3 Encounters:  ?07/04/21 (!) 366 lb 9.6 oz (166.3 kg)  ?02/06/21 (!) 354 lb (160.6 kg)  ?01/02/21 (!) 347 lb (157.4 kg)  ? ?BMI Readings from Last 3 Encounters:  ?07/04/21 49.72 kg/m?  ?02/06/21 48.01 kg/m?  ?01/02/21 48.40 kg/m?  ? ? ?Assessment/Interventions: Review of patient past medical history, allergies, medications, health status, including review of consultants reports, laboratory and other test data, was performed as part of comprehensive evaluation and provision of chronic care management services.  ? ?SDOH:  (Social Determinants of Health) assessments and interventions performed: Yes ? ?SDOH Screenings  ? ?Alcohol Screen: Not on file  ?Depression (PHQ2-9): Low Risk   ? PHQ-2 Score: 0  ?Financial Resource Strain: Low Risk   ? Difficulty of Paying Living Expenses: Not very hard  ?Food Insecurity: Not on file  ?Housing: Not on file  ?Physical Activity: Not on file  ?Social Connections: Not on file  ?Stress: Not on file  ?Tobacco Use: Low Risk   ? Smoking Tobacco Use: Never  ? Smokeless Tobacco Use: Never  ? Passive  Exposure: Not on file  ?Transportation Needs: Not on file  ? ? ?[place] ? ?Allergies  ?Allergen Reactions  ? Penicillins Other (See Comments)  ?  Unknown childhood allergic reaction ?Has patient had a PCN reaction causing immediate rash, facial/tongue/throat swelling, SOB or lightheadedness with hypotension: Unknown ?Has patient had a PCN reaction causing severe rash involving mucus membranes or skin necrosis: Unknown ?Has patient had a PCN reaction that required hospitalization: No ?Has patient had a PCN reaction occurring within the last 10 years: No ?If all of the above answers are "NO",  then may proceed with Cephalosporin use. ?  ? Tizanidine Other (See Comments)  ?  07/26/20: Pt does not recognize drug and does not remember being allergic to it.  ? ? ?Medications Reviewed Today   ? ? Reviewed by Tomasa Blase, [facility] (Pharmacist) on 08/29/21 at 1129  Med List Status: <None>  ? ?Medication Order Taking? Sig Documenting Provider Last Dose Status Informant  ?allopurinol (ZYLOPRIM) 100 MG tablet [id number] Yes Take 1 tablet (100 mg total) by mouth daily. Biagio Borg, MD Taking Active   ?amLODipine (NORVASC) 10 MG tablet [id number] Yes Take 1 tablet (10 mg total) by mouth daily. Biagio Borg, MD Taking Active   ?apixaban (ELIQUIS) 5 MG TABS tablet [id number] Yes Take 5 mg by mouth 2 (two) times daily. [provider] Taking Active   ?atorvastatin (LIPITOR) 10 MG tablet [id number] Yes TAKE 1 TABLET EVERY DAY Biagio Borg, MD Taking Active   ?carvedilol (COREG) 12.5 MG tablet [id number] Yes TAKE 1 TABLET TWICE DAILY WITH MEALS Biagio Borg, MD Taking Active   ?Cholecalciferol (THERA-D 2000) 50 MCG (2000 UT) TABS [id number] Yes 1 tab by mouth once daily Biagio Borg, MD Taking Active   ?cloNIDine (CATAPRES) 0.2 MG tablet [id number] Yes Take 0.2 mg by mouth 2 (two) times daily. [provider] Taking Active   ?colchicine 0.6 MG tablet [id number] Yes Take 1 every hour until pain improved or  diarrhea, then once daily as needed Biagio Borg, MD Taking Active   ?diltiazem (CARDIZEM CD) 180 MG 24 hr capsule [id number] No Take 180 mg by mouth daily.  ?Patient not taking: Reported on 08/29/2021  ? Provider, Historical,

## 2021-12-05 DIAGNOSIS — E1122 Type 2 diabetes mellitus with diabetic chronic kidney disease: Secondary | ICD-10-CM | POA: Diagnosis not present

## 2021-12-05 DIAGNOSIS — Z7984 Long term (current) use of oral hypoglycemic drugs: Secondary | ICD-10-CM | POA: Diagnosis not present

## 2021-12-05 DIAGNOSIS — I4891 Unspecified atrial fibrillation: Secondary | ICD-10-CM

## 2021-12-05 DIAGNOSIS — E785 Hyperlipidemia, unspecified: Secondary | ICD-10-CM | POA: Diagnosis not present

## 2021-12-05 DIAGNOSIS — E1159 Type 2 diabetes mellitus with other circulatory complications: Secondary | ICD-10-CM

## 2021-12-05 DIAGNOSIS — N1831 Chronic kidney disease, stage 3a: Secondary | ICD-10-CM | POA: Diagnosis not present

## 2021-12-05 DIAGNOSIS — I129 Hypertensive chronic kidney disease with stage 1 through stage 4 chronic kidney disease, or unspecified chronic kidney disease: Secondary | ICD-10-CM | POA: Diagnosis not present

## 2021-12-20 ENCOUNTER — Other Ambulatory Visit: Payer: Self-pay | Admitting: Internal Medicine

## 2021-12-20 NOTE — Telephone Encounter (Signed)
Please refill as per office routine med refill policy (all routine meds to be refilled for 3 mo or monthly (per pt preference) up to one year from last visit, then month to month grace period for 3 mo, then further med refills will have to be denied) ? ?

## 2021-12-29 ENCOUNTER — Other Ambulatory Visit (INDEPENDENT_AMBULATORY_CARE_PROVIDER_SITE_OTHER): Payer: Medicare HMO

## 2021-12-29 DIAGNOSIS — E1169 Type 2 diabetes mellitus with other specified complication: Secondary | ICD-10-CM

## 2021-12-29 DIAGNOSIS — E538 Deficiency of other specified B group vitamins: Secondary | ICD-10-CM

## 2021-12-29 DIAGNOSIS — E559 Vitamin D deficiency, unspecified: Secondary | ICD-10-CM | POA: Diagnosis not present

## 2021-12-29 DIAGNOSIS — E669 Obesity, unspecified: Secondary | ICD-10-CM

## 2021-12-29 LAB — HEPATIC FUNCTION PANEL
ALT: 12 U/L (ref 0–53)
AST: 10 U/L (ref 0–37)
Albumin: 3.5 g/dL (ref 3.5–5.2)
Alkaline Phosphatase: 115 U/L (ref 39–117)
Bilirubin, Direct: 0.1 mg/dL (ref 0.0–0.3)
Total Bilirubin: 0.4 mg/dL (ref 0.2–1.2)
Total Protein: 6.9 g/dL (ref 6.0–8.3)

## 2021-12-29 LAB — BASIC METABOLIC PANEL
BUN: 35 mg/dL — ABNORMAL HIGH (ref 6–23)
CO2: 25 mEq/L (ref 19–32)
Calcium: 9 mg/dL (ref 8.4–10.5)
Chloride: 105 mEq/L (ref 96–112)
Creatinine, Ser: 2.24 mg/dL — ABNORMAL HIGH (ref 0.40–1.50)
GFR: 29.69 mL/min — ABNORMAL LOW (ref >60.00)
Glucose, Bld: 293 mg/dL — ABNORMAL HIGH (ref 70–99)
Potassium: 5.1 mEq/L (ref 3.5–5.1)
Sodium: 139 mEq/L (ref 135–145)

## 2021-12-29 LAB — VITAMIN D 25 HYDROXY (VIT D DEFICIENCY, FRACTURES): VITD: 18.47 ng/mL — ABNORMAL LOW (ref 30.00–100.00)

## 2021-12-29 LAB — HEMOGLOBIN A1C: Hgb A1c MFr Bld: 8.4 % — ABNORMAL HIGH (ref 4.6–6.5)

## 2021-12-29 LAB — LIPID PANEL
Cholesterol: 138 mg/dL (ref 0–200)
HDL: 44.1 mg/dL (ref >39.00)
LDL Cholesterol: 61 mg/dL (ref 0–99)
NonHDL: 94.26
Total CHOL/HDL Ratio: 3
Triglycerides: 164 mg/dL — ABNORMAL HIGH (ref 0.0–149.0)
VLDL: 32.8 mg/dL (ref 0.0–40.0)

## 2021-12-29 LAB — VITAMIN B12: Vitamin B-12: >1504 pg/mL — ABNORMAL HIGH (ref 211–911)

## 2022-01-02 ENCOUNTER — Encounter: Payer: Self-pay | Admitting: Internal Medicine

## 2022-01-02 ENCOUNTER — Other Ambulatory Visit: Payer: Self-pay | Admitting: Internal Medicine

## 2022-01-02 ENCOUNTER — Ambulatory Visit (INDEPENDENT_AMBULATORY_CARE_PROVIDER_SITE_OTHER): Payer: Medicare HMO | Admitting: Internal Medicine

## 2022-01-02 VITALS — BP 136/78 | HR 57 | Temp 97.7°F | Ht 72.0 in | Wt 385.0 lb

## 2022-01-02 DIAGNOSIS — E669 Obesity, unspecified: Secondary | ICD-10-CM

## 2022-01-02 DIAGNOSIS — Z0001 Encounter for general adult medical examination with abnormal findings: Secondary | ICD-10-CM

## 2022-01-02 DIAGNOSIS — E559 Vitamin D deficiency, unspecified: Secondary | ICD-10-CM | POA: Diagnosis not present

## 2022-01-02 DIAGNOSIS — E1169 Type 2 diabetes mellitus with other specified complication: Secondary | ICD-10-CM | POA: Diagnosis not present

## 2022-01-02 DIAGNOSIS — E7800 Pure hypercholesterolemia, unspecified: Secondary | ICD-10-CM

## 2022-01-02 DIAGNOSIS — N1831 Chronic kidney disease, stage 3a: Secondary | ICD-10-CM | POA: Diagnosis not present

## 2022-01-02 DIAGNOSIS — Z6841 Body Mass Index (BMI) 40.0 and over, adult: Secondary | ICD-10-CM | POA: Diagnosis not present

## 2022-01-02 DIAGNOSIS — I509 Heart failure, unspecified: Secondary | ICD-10-CM | POA: Diagnosis not present

## 2022-01-02 DIAGNOSIS — E538 Deficiency of other specified B group vitamins: Secondary | ICD-10-CM

## 2022-01-02 DIAGNOSIS — E1122 Type 2 diabetes mellitus with diabetic chronic kidney disease: Secondary | ICD-10-CM | POA: Diagnosis not present

## 2022-01-02 MED ORDER — OZEMPIC (0.25 OR 0.5 MG/DOSE) 2 MG/1.5ML ~~LOC~~ SOPN
0.5000 mg | PEN_INJECTOR | SUBCUTANEOUS | 3 refills | Status: DC
Start: 2022-01-02 — End: 2022-01-02

## 2022-01-02 NOTE — Patient Instructions (Signed)
Please take all new medication as prescribed - the ozempic if ok with the insurance ? ?Please continue all other medications as before, and refills have been done if requested. ? ?Please have the pharmacy call with any other refills you may need. ? ?Please continue your efforts at being more active, low cholesterol diet, and weight control. ? ?You are otherwise up to date with prevention measures today. ? ?Please keep your appointments with your specialists as you may have planned ? ?Please make an Appointment to return in 6 months, or sooner if needed, also with Lab Appointment for testing done 3-5 days before at the [facility] (so this is for TWO appointments - please see the scheduling desk as you leave) ? ?Due to the ongoing Covid 19 pandemic, our lab now requires an appointment for any labs done at our office.  If you need labs done and do not have an appointment, please call our office ahead of time to schedule before presenting to the lab for your testing. ? ? ?

## 2022-01-02 NOTE — Progress Notes (Signed)
Patient ID: [name], male   DOB: May 13, 1956, 66 y.o.   MRN: [id number] ? ? ? ?     Chief Complaint:: wellness exam and dm, ckd, low b12, htn ? ?     HPI:  Gerald Day is a 66 y.o. male here for wellness exam; plans to see optho on his own, declines colonoscopy, o/w up to date ?         ?              Also very difficult to lose wt recently despite exercise, lower calories; has had increased knee pain and less activity overall.  Taking B12.  Not taking Vitamin D  Sees renal yearly.  Willing to try ozempic for wt loss and dm,  Pt denies chest pain, increased sob or doe, wheezing, orthopnea, PND, increased LE swelling, palpitations, dizziness or syncope.   Pt denies polydipsia, polyuria, or new focal neuro s/s.    Pt denies fever, wt loss, night sweats, loss of appetite, or other constitutional symptoms  No other new complaints ?  ?Wt Readings from Last 3 Encounters:  ?01/02/22 (!) 385 lb (174.6 kg)  ?07/04/21 (!) 366 lb 9.6 oz (166.3 kg)  ?02/06/21 (!) 354 lb (160.6 kg)  ? ?BP Readings from Last 3 Encounters:  ?01/02/22 136/78  ?07/04/21 (!) 168/76  ?02/06/21 130/68  ? ?Immunization History  ?Administered Date(s) Administered  ? Fluad Quad(high Dose 65+) 07/04/2021  ? PFIZER(Purple Top)SARS-COV-2 Vaccination 02/26/2020, 03/27/2020  ? Pneumococcal Polysaccharide-23 10/27/2013, 07/04/2021  ? Td 08/28/2008  ? Tdap 12/22/2018  ? ?Health Maintenance Due  ?Topic Date Due  ? OPHTHALMOLOGY EXAM  Never done  ? ?  ? ?Past Medical History:  ?Diagnosis Date  ? Arthritis   ? IN KNEES  ? Cellulitis 10/26/2013  ? LOWER RT EXTREMITY  ? CELLULITIS/ABSCESS, LEG 06/27/2007  ? COLONIC POLYPS   ? CONGESTIVE HEART FAILURE   ? DIABETES MELLITUS, TYPE II   ? DIVERTICULOSIS, COLON   ? ERECTILE DYSFUNCTION, ORGANIC   ? GOUT NOS   ? Headache   ? HYPERLIPIDEMIA   ? HYPERTENSION   ? LOW BACK PAIN   ? Morbid obesity ([place])   ? SLEEP APNEA, OBSTRUCTIVE   ? USES CPAP  ? ?Past Surgical History:  ?Procedure Laterality Date  ? APPENDECTOMY     ? CARDIOVERSION N/A 08/02/2020  ? Procedure: CARDIOVERSION;  Surgeon: Sanda Klein, MD;  Location: [facility];  Service: Cardiovascular;  Laterality: N/A;  ? FOOT SURGERY    ? LT  ? TEE WITHOUT CARDIOVERSION N/A 08/02/2020  ? Procedure: TRANSESOPHAGEAL ECHOCARDIOGRAM (TEE);  Surgeon: Sanda Klein, MD;  Location: [place];  Service: Cardiovascular;  Laterality: N/A;  ? TENOTOMY / FLEXOR TENDON TRANSFER Right 03/15/2015  ? Procedure: TENOTOMY HT REPAIR/ULCER DEBRIDEMENT/GRAFT PREP/ACELL GRAFT APPLICATION;  Surgeon: Jana Half, DPM;  Location: [place];  Service: Podiatry;  Laterality: Right;  [place]  ? ? reports that he has never smoked. He has never used smokeless tobacco. He reports that he does not currently use alcohol after a past usage of about 3.0 - 4.0 standard drinks per week. He reports that he does not use drugs. ?family history includes Asthma in an other family member; Cardiomyopathy in his father. ?Allergies  ?Allergen Reactions  ? Penicillins Other (See Comments)  ?  Unknown childhood allergic reaction ?Has patient had a PCN reaction causing immediate rash, facial/tongue/throat swelling, SOB or lightheadedness with hypotension: Unknown ?Has patient had a PCN reaction causing severe rash  involving mucus membranes or skin necrosis: Unknown ?Has patient had a PCN reaction that required hospitalization: No ?Has patient had a PCN reaction occurring within the last 10 years: No ?If all of the above answers are "NO", then may proceed with Cephalosporin use. ?  ? Tizanidine Other (See Comments)  ?  07/26/20: Pt does not recognize drug and does not remember being allergic to it.  ? ?Current Outpatient Medications on File Prior to Visit  ?Medication Sig Dispense Refill  ? allopurinol (ZYLOPRIM) 100 MG tablet TAKE 1 TABLET EVERY DAY 90 tablet 0  ? amLODipine (NORVASC) 10 MG tablet Take 1 tablet (10 mg total) by mouth daily. 90 tablet 3  ? apixaban (ELIQUIS) 5 MG TABS tablet TAKE 1 TABLET TWICE DAILY  180 tablet 2  ? atorvastatin (LIPITOR) 10 MG tablet TAKE 1 TABLET EVERY DAY 90 tablet 3  ? carvedilol (COREG) 12.5 MG tablet TAKE 1 TABLET TWICE DAILY WITH MEALS 180 tablet 0  ? Cholecalciferol (THERA-D 2000) 50 MCG (2000 UT) TABS 1 tab by mouth once daily 90 tablet 99  ? cloNIDine (CATAPRES) 0.2 MG tablet TAKE 1 TABLET TWICE DAILY 180 tablet 2  ? diltiazem (CARDIZEM CD) 180 MG 24 hr capsule TAKE 1 CAPSULE EVERY DAY 90 capsule 0  ? furosemide (LASIX) 80 MG tablet Take 1 tablet (80 mg total) by mouth daily. 90 tablet 3  ? metFORMIN (GLUCOPHAGE) 500 MG tablet TAKE 2 TABLETS (1,000 MG TOTAL) BY MOUTH 2 (TWO) TIMES DAILY WITH A MEAL. 360 tablet 0  ? potassium chloride SA (KLOR-CON) 20 MEQ tablet TAKE 1 TABLET EVERY DAY 90 tablet 3  ? telmisartan (MICARDIS) 80 MG tablet TAKE 1 TABLET EVERY DAY 90 tablet 3  ? vitamin B-12 (CYANOCOBALAMIN) 1000 MCG tablet Take 1 tablet (1,000 mcg total) by mouth daily. 90 tablet 3  ? ?No current facility-administered medications on file prior to visit.  ? ?     ROS:  All others reviewed and negative. ? ?Objective  ? ?     PE:  BP 136/78 (BP Location: Right Arm, Patient Position: Sitting, Cuff Size: Large)   Pulse (!) 57   Temp 97.7 ?F (36.5 ?C) (Oral)   Ht 6' (1.829 m)   Wt (!) 385 lb (174.6 kg)   SpO2 98%   BMI 52.22 kg/m?  ? ?              Constitutional: Pt appears in NAD ?              HENT: Head: NCAT.  ?              Right Ear: External ear normal.   ?              Left Ear: External ear normal.  ?              Eyes: . Pupils are equal, round, and reactive to light. Conjunctivae and EOM are normal ?              Nose: without d/c or deformity ?              Neck: Neck supple. Gross normal ROM ?              Cardiovascular: Normal rate and regular rhythm.   ?              Pulmonary/Chest: Effort normal and breath sounds without rales or wheezing.  ?  Abd:  Soft, NT, ND, + BS, no organomegaly ?              Neurological: Pt is alert. At baseline orientation, motor  grossly intact ?              Skin: Skin is warm. No rashes, no other new lesions, LE edema - chronic 1+ to knees ?              Psychiatric: Pt behavior is normal without agitation  ? ?Micro: none ? ?Cardiac tracings I have personally interpreted today:  none ? ?Pertinent Radiological findings (summarize): none  ? ?Lab Results  ?Component Value Date  ? WBC 6.6 06/30/2021  ? HGB 12.0 (L) 06/30/2021  ? HCT 35.8 (L) 06/30/2021  ? PLT 214.0 06/30/2021  ? GLUCOSE 293 (H) 12/29/2021  ? CHOL 138 12/29/2021  ? TRIG 164.0 (H) 12/29/2021  ? HDL 44.10 12/29/2021  ? LDLDIRECT 83.0 12/19/2018  ? [place] 61 12/29/2021  ? ALT 12 12/29/2021  ? AST 10 12/29/2021  ? NA 139 12/29/2021  ? K 5.1 12/29/2021  ? CL 105 12/29/2021  ? CREATININE 2.24 (H) 12/29/2021  ? BUN 35 (H) 12/29/2021  ? CO2 25 12/29/2021  ? TSH 4.30 12/31/2020  ? PSA 0.35 12/31/2020  ? INR 1.4 (H) 07/26/2020  ? HGBA1C 8.4 (H) 12/29/2021  ? MICROALBUR 159.7 (H) 12/31/2020  ? ?Assessment/Plan:  ?MAXXWELL EDGETT is a 66 y.o. White or Caucasian [1] male with  has a past medical history of Arthritis, Cellulitis (10/26/2013), CELLULITIS/ABSCESS, LEG (06/27/2007), COLONIC POLYPS, CONGESTIVE HEART FAILURE, DIABETES MELLITUS, TYPE II, DIVERTICULOSIS, COLON, ERECTILE DYSFUNCTION, ORGANIC, GOUT NOS, Headache, HYPERLIPIDEMIA, HYPERTENSION, LOW BACK PAIN, Morbid obesity ([place]), and SLEEP APNEA, OBSTRUCTIVE. ? ?Encounter for well adult exam with abnormal findings ?Age and sex appropriate education and counseling updated with regular exercise and diet ?Referrals for preventative services - plans to call optho exam soon, declines colonoscopy ?Immunizations addressed - none needed ?Smoking counseling  - none needed ?Evidence for depression or other mood disorder - none significant ?Most recent labs reviewed. ?I have personally reviewed and have noted: ?1) the patient's medical and social history ?2) The patient's current medications and supplements ?3) The patient's height, weight, and  BMI have been recorded in the chart ? ? ?Obesity (BMI 35.0-39.9 without comorbidity) ?Pt ok for ozempic asd,  to f/u any worsening symptoms or concerns ? ?Stage 3a chronic kidney disease ([place]) ?Lab Results  ?C

## 2022-01-03 ENCOUNTER — Encounter: Payer: Self-pay | Admitting: Internal Medicine

## 2022-01-03 NOTE — Assessment & Plan Note (Signed)
Lab Results  ?Component Value Date  ? CREATININE 2.24 (H) 12/29/2021  ? ?Stable overall, cont to avoid nephrotoxins ? ?

## 2022-01-03 NOTE — Assessment & Plan Note (Signed)
Pt ok for ozempic asd,  to f/u any worsening symptoms or concerns ?

## 2022-01-03 NOTE — Assessment & Plan Note (Signed)
Lab Results  ?Component Value Date  ? [place] 61 12/29/2021  ? ?Stable, pt to continue current statin lipitor 10 ? ?

## 2022-01-03 NOTE — Assessment & Plan Note (Signed)
Lab Results  ?Component Value Date  ? [id number] >1504 (H) 12/29/2021  ? ?overcontrolled, to decrease the b12 to m-w-f, cont oral replacement - b12 1000 mcg qd ? ?

## 2022-01-03 NOTE — Assessment & Plan Note (Signed)
Last vitamin D ?Lab Results  ?Component Value Date  ? VD25OH 18.47 (L) 12/29/2021  ? ?Low, to start oral replacement ? ?

## 2022-01-03 NOTE — Assessment & Plan Note (Signed)
Lab Results  ?Component Value Date  ? HGBA1C 8.4 (H) 12/29/2021  ? ?Uncontrolled, to add ozempic for sugar and wt los, pt to continue current medical treatment norvasc, coreg, clonidine ? ?

## 2022-01-03 NOTE — Assessment & Plan Note (Signed)
Age and sex appropriate education and counseling updated with regular exercise and diet ?Referrals for preventative services - plans to call optho exam soon, declines colonoscopy ?Immunizations addressed - none needed ?Smoking counseling  - none needed ?Evidence for depression or other mood disorder - none significant ?Most recent labs reviewed. ?I have personally reviewed and have noted: ?1) the patient's medical and social history ?2) The patient's current medications and supplements ?3) The patient's height, weight, and BMI have been recorded in the chart ? ?

## 2022-01-19 ENCOUNTER — Other Ambulatory Visit: Payer: Self-pay | Admitting: Internal Medicine

## 2022-01-19 NOTE — Telephone Encounter (Signed)
Please refill as per office routine med refill policy (all routine meds to be refilled for 3 mo or monthly (per pt preference) up to one year from last visit, then month to month grace period for 3 mo, then further med refills will have to be denied) ? ?

## 2022-02-18 ENCOUNTER — Telehealth: Payer: Medicare HMO

## 2022-02-23 ENCOUNTER — Ambulatory Visit (INDEPENDENT_AMBULATORY_CARE_PROVIDER_SITE_OTHER): Payer: Medicare HMO

## 2022-02-23 VITALS — BP 151/78 | Ht 72.0 in | Wt 385.0 lb

## 2022-02-23 DIAGNOSIS — Z0000 Encounter for general adult medical examination without abnormal findings: Secondary | ICD-10-CM | POA: Diagnosis not present

## 2022-02-23 NOTE — Patient Instructions (Signed)
Mr. Homewood , Thank you for taking time to come for your Medicare Wellness Visit. I appreciate your ongoing commitment to your health goals. Please review the following plan we discussed and let me know if I can assist you in the future.   These are the goals we discussed:  Goals      <enter goal here>     Stay healthy     Track and Manage My Blood Pressure-Hypertension     Timeframe:  Long-Range Goal Priority:  High Start Date:   05/06/2021                          Expected End Date:  11/06/2021                     Follow Up Date June 2023   - check blood pressure daily - choose a place to take my blood pressure (home, clinic or office, retail store) - write blood pressure results in a log or diary    Why is this important?   You won't feel high blood pressure, but it can still hurt your blood vessels.  High blood pressure can cause heart or kidney problems. It can also cause a stroke.  Making lifestyle changes like losing a little weight or eating less salt will help.  Checking your blood pressure at home and at different times of the day can help to control blood pressure.  If the doctor prescribes medicine remember to take it the way the doctor ordered.  Call the office if you cannot afford the medicine or if there are questions about it.           This is a list of the screening recommended for you and due dates:  Health Maintenance  Topic Date Due   Eye exam for diabetics  Never done   Zoster (Shingles) Vaccine (1 of 2) 04/03/2022*   Colon Cancer Screening  01/04/2023*   Hemoglobin A1C  06/30/2022   Pneumonia Vaccine (2 - PCV) 07/04/2022   Complete foot exam   01/03/2023   Tetanus Vaccine  12/21/2028   Hepatitis C Screening: USPSTF Recommendation to screen - Ages 18-79 yo.  Completed   HPV Vaccine  Aged Out   Flu Shot  Discontinued   COVID-19 Vaccine  Discontinued  *Topic was postponed. The date shown is not the original due date.   Advanced directives:  No  Conditions/risks identified: None  Next appointment: Follow up in one year for your annual wellness visit.   Preventive Care 5 Years and Older, Male Preventive care refers to lifestyle choices and visits with your health care provider that can promote health and wellness. What does preventive care include? A yearly physical exam. This is also called an annual well check. Dental exams once or twice a year. Routine eye exams. Ask your health care provider how often you should have your eyes checked. Personal lifestyle choices, including: Daily care of your teeth and gums. Regular physical activity. Eating a healthy diet. Avoiding tobacco and drug use. Limiting alcohol use. Practicing safe sex. Taking low doses of aspirin every day. Taking vitamin and mineral supplements as recommended by your health care provider. What happens during an annual well check? The services and screenings done by your health care provider during your annual well check will depend on your age, overall health, lifestyle risk factors, and family history of disease. Counseling  Your health care provider may ask  you questions about your: Alcohol use. Tobacco use. Drug use. Emotional well-being. Home and relationship well-being. Sexual activity. Eating habits. History of falls. Memory and ability to understand (cognition). Work and work Statistician. Screening  You may have the following tests or measurements: Height, weight, and BMI. Blood pressure. Lipid and cholesterol levels. These may be checked every 5 years, or more frequently if you are over 25 years old. Skin check. Lung cancer screening. You may have this screening every year starting at age 61 if you have a 30-pack-year history of smoking and currently smoke or have quit within the past 15 years. Fecal occult blood test (FOBT) of the stool. You may have this test every year starting at age 65. Flexible sigmoidoscopy or colonoscopy. You may  have a sigmoidoscopy every 5 years or a colonoscopy every 10 years starting at age 54. Prostate cancer screening. Recommendations will vary depending on your family history and other risks. Hepatitis C blood test. Hepatitis B blood test. Sexually transmitted disease (STD) testing. Diabetes screening. This is done by checking your blood sugar (glucose) after you have not eaten for a while (fasting). You may have this done every 1-3 years. Abdominal aortic aneurysm (AAA) screening. You may need this if you are a current or former smoker. Osteoporosis. You may be screened starting at age 66 if you are at high risk. Talk with your health care provider about your test results, treatment options, and if necessary, the need for more tests. Vaccines  Your health care provider may recommend certain vaccines, such as: Influenza vaccine. This is recommended every year. Tetanus, diphtheria, and acellular pertussis (Tdap, Td) vaccine. You may need a Td booster every 10 years. Zoster vaccine. You may need this after age 48. Pneumococcal 13-valent conjugate (PCV13) vaccine. One dose is recommended after age 59. Pneumococcal polysaccharide (PPSV23) vaccine. One dose is recommended after age 15. Talk to your health care provider about which screenings and vaccines you need and how often you need them. This information is not intended to replace advice given to you by your health care provider. Make sure you discuss any questions you have with your health care provider. Document Released: 09/20/2015 Document Revised: 05/13/2016 Document Reviewed: 06/25/2015 Elsevier Interactive Patient Education  2017 [place] Prevention in the Home Falls can cause injuries. They can happen to people of all ages. There are many things you can do to make your home safe and to help prevent falls. What can I do on the outside of my home? Regularly fix the edges of walkways and driveways and fix any cracks. Remove  anything that might make you trip as you walk through a door, such as a raised step or threshold. Trim any bushes or trees on the path to your home. Use bright outdoor lighting. Clear any walking paths of anything that might make someone trip, such as rocks or tools. Regularly check to see if handrails are loose or broken. Make sure that both sides of any steps have handrails. Any raised decks and porches should have guardrails on the edges. Have any leaves, snow, or ice cleared regularly. Use sand or salt on walking paths during winter. Clean up any spills in your garage right away. This includes oil or grease spills. What can I do in the bathroom? Use night lights. Install grab bars by the toilet and in the tub and shower. Do not use towel bars as grab bars. Use non-skid mats or decals in the tub or shower. If  you need to sit down in the shower, use a plastic, non-slip stool. Keep the floor dry. Clean up any water that spills on the floor as soon as it happens. Remove soap buildup in the tub or shower regularly. Attach bath mats securely with double-sided non-slip rug tape. Do not have throw rugs and other things on the floor that can make you trip. What can I do in the bedroom? Use night lights. Make sure that you have a light by your bed that is easy to reach. Do not use any sheets or blankets that are too big for your bed. They should not hang down onto the floor. Have a firm chair that has side arms. You can use this for support while you get dressed. Do not have throw rugs and other things on the floor that can make you trip. What can I do in the kitchen? Clean up any spills right away. Avoid walking on wet floors. Keep items that you use a lot in easy-to-reach places. If you need to reach something above you, use a strong step stool that has a grab bar. Keep electrical cords out of the way. Do not use floor polish or wax that makes floors slippery. If you must use wax, use  non-skid floor wax. Do not have throw rugs and other things on the floor that can make you trip. What can I do with my stairs? Do not leave any items on the stairs. Make sure that there are handrails on both sides of the stairs and use them. Fix handrails that are broken or loose. Make sure that handrails are as long as the stairways. Check any carpeting to make sure that it is firmly attached to the stairs. Fix any carpet that is loose or worn. Avoid having throw rugs at the top or bottom of the stairs. If you do have throw rugs, attach them to the floor with carpet tape. Make sure that you have a light switch at the top of the stairs and the bottom of the stairs. If you do not have them, ask someone to add them for you. What else can I do to help prevent falls? Wear shoes that: Do not have high heels. Have rubber bottoms. Are comfortable and fit you well. Are closed at the toe. Do not wear sandals. If you use a stepladder: Make sure that it is fully opened. Do not climb a closed stepladder. Make sure that both sides of the stepladder are locked into place. Ask someone to hold it for you, if possible. Clearly mark and make sure that you can see: Any grab bars or handrails. First and last steps. Where the edge of each step is. Use tools that help you move around (mobility aids) if they are needed. These include: Canes. Walkers. Scooters. Crutches. Turn on the lights when you go into a dark area. Replace any light bulbs as soon as they burn out. Set up your furniture so you have a clear path. Avoid moving your furniture around. If any of your floors are uneven, fix them. If there are any pets around you, be aware of where they are. Review your medicines with your doctor. Some medicines can make you feel dizzy. This can increase your chance of falling. Ask your doctor what other things that you can do to help prevent falls. This information is not intended to replace advice given to  you by your health care provider. Make sure you discuss any questions you have with  your health care provider. Document Released: 06/20/2009 Document Revised: 01/30/2016 Document Reviewed: 09/28/2014 Elsevier Interactive Patient Education  2017 [employer].

## 2022-02-23 NOTE — Progress Notes (Signed)
Subjective:   Gerald Day is a 66 y.o. male who presents for Medicare Annual/Subsequent preventive examination.  Review of Systems    Virtual Visit via Telephone Note  I connected with  Gerald Day on 02/23/22 at  1:15 PM EDT by telephone and verified that I am speaking with the correct person using two identifiers.  Location: Patient: Home Provider: Office Persons participating in the virtual visit: patient/Nurse Health Advisor   I discussed the limitations, risks, security and privacy concerns of performing an evaluation and management service by telephone and the availability of in person appointments. The patient expressed understanding and agreed to proceed.  Interactive audio and video telecommunications were attempted between this nurse and patient, however failed, due to patient having technical difficulties OR patient did not have access to video capability.  We continued and completed visit with audio only.  Some vital signs may be absent or patient reported.   Criselda Peaches, LPN  Cardiac Risk Factors include: advanced age (>35mn, >>31women);diabetes mellitus;hypertension;male gender;obesity (BMI >30kg/m2)     Objective:    Today's Vitals   02/23/22 1307  BP: (!) 151/78  Weight: (!) 385 lb (174.6 kg)  Height: 6' (1.829 m)   Body mass index is 52.22 kg/m.     02/23/2022    1:17 PM 09/01/2020    4:12 PM 07/26/2020    8:53 PM 02/25/2018    6:11 AM 11/01/2017    6:31 AM 06/22/2017   11:51 AM 05/28/2017    2:58 PM  Advanced Directives  Does Patient Have a Medical Advance Directive? No No Yes No No No No  Type of Advance Directive   [place]     Does patient want to make changes to medical advance directive?   No - Patient declined      [place] of [place]?   No - copy requested      Would patient like information on creating a medical advance directive? No - Patient declined No - Patient declined  No -  Patient declined No - Patient declined No - Patient declined No - Patient declined    Current Medications (verified) Outpatient Encounter Medications as of 02/23/2022  Medication Sig   carvedilol (COREG) 12.5 MG tablet TAKE 1 TABLET TWICE DAILY WITH MEALS   allopurinol (ZYLOPRIM) 100 MG tablet TAKE 1 TABLET EVERY DAY   amLODipine (NORVASC) 10 MG tablet Take 1 tablet (10 mg total) by mouth daily.   apixaban (ELIQUIS) 5 MG TABS tablet TAKE 1 TABLET TWICE DAILY   atorvastatin (LIPITOR) 10 MG tablet TAKE 1 TABLET EVERY DAY   Cholecalciferol (THERA-D 2000) 50 MCG (2000 UT) TABS 1 tab by mouth once daily   cloNIDine (CATAPRES) 0.2 MG tablet TAKE 1 TABLET TWICE DAILY   diltiazem (CARDIZEM CD) 180 MG 24 hr capsule TAKE 1 CAPSULE EVERY DAY   Dulaglutide (TRULICITY) 00.09MQZ/3.0QTSOPN Inject 0.75 mg into the skin once a week.   furosemide (LASIX) 80 MG tablet Take 1 tablet (80 mg total) by mouth daily.   metFORMIN (GLUCOPHAGE) 500 MG tablet TAKE 2 TABLETS (1,000 MG TOTAL) BY MOUTH 2 (TWO) TIMES DAILY WITH A MEAL.   potassium chloride SA (KLOR-CON) 20 MEQ tablet TAKE 1 TABLET EVERY DAY   telmisartan (MICARDIS) 80 MG tablet TAKE 1 TABLET EVERY DAY   vitamin B-12 (CYANOCOBALAMIN) 1000 MCG tablet Take 1 tablet (1,000 mcg total) by mouth daily.   No facility-administered encounter  medications on file as of 02/23/2022.    Allergies (verified) Penicillins and Tizanidine   History: Past Medical History:  Diagnosis Date   Arthritis    IN KNEES   Cellulitis 10/26/2013   LOWER RT EXTREMITY   CELLULITIS/ABSCESS, LEG 06/27/2007   COLONIC POLYPS    CONGESTIVE HEART FAILURE    DIABETES MELLITUS, TYPE II    DIVERTICULOSIS, COLON    ERECTILE DYSFUNCTION, ORGANIC    GOUT NOS    Headache    HYPERLIPIDEMIA    HYPERTENSION    LOW BACK PAIN    Morbid obesity ([place])    SLEEP APNEA, OBSTRUCTIVE    USES CPAP   Past Surgical History:  Procedure Laterality Date   APPENDECTOMY     CARDIOVERSION N/A  08/02/2020   Procedure: CARDIOVERSION;  Surgeon: Sanda Klein, MD;  Location: [facility];  Service: Cardiovascular;  Laterality: N/A;   FOOT SURGERY     LT   TEE WITHOUT CARDIOVERSION N/A 08/02/2020   Procedure: TRANSESOPHAGEAL ECHOCARDIOGRAM (TEE);  Surgeon: Sanda Klein, MD;  Location: [facility] END[facility];  Service: Cardiovascular;  Laterality: N/A;   TENOTOMY / FLEXOR TENDON TRANSFER Right 03/15/2015   Procedure: TENOTOMY HT REPAIR/ULCER DEBRIDEMENT/GRAFT PREP/ACELL GRAFT APPLICATION;  Surgeon: Jana Half, DPM;  Location: [facility];  Service: Podiatry;  Laterality: Right;  HALLUX   Family History  Problem Relation Age of Onset   Cardiomyopathy Father    Asthma Other    Social History   Socioeconomic History   Marital status: Married    Spouse name: Not on file   Number of children: Not on file   Years of education: Not on file   Highest education level: Not on file  Occupational History   Occupation: retired  Tobacco Use   Smoking status: Never   Smokeless tobacco: Never  Vaping Use   Vaping Use: Never used  Substance and Sexual Activity   Alcohol use: Not Currently    Alcohol/week: 3.0 - 4.0 standard drinks of alcohol    Types: 3 - 4 Cans of beer per week    Comment: hardly any; h/o heavy use   Drug use: No   Sexual activity: Not on file  Other Topics Concern   Not on file  Social History Narrative   Not on file   Social Determinants of Health   Financial Resource Strain: Low Risk  (02/23/2022)   Overall Financial Resource Strain (CARDIA)    Difficulty of Paying Living Expenses: Not hard at all  Food Insecurity: No Food Insecurity (02/23/2022)   Hunger Vital Sign    Worried About Running Out of Food in the Last Year: Never true    Ran Out of Food in the Last Year: Never true  Transportation Needs: No Transportation Needs (02/23/2022)   PRAPARE - Hydrologist (Medical): No    Lack of Transportation (Non-Medical): No  Physical Activity:  Sufficiently Active (02/23/2022)   Exercise Vital Sign    Days of Exercise per Week: 7 days    Minutes of Exercise per Session: 30 min  Stress: No Stress Concern Present (02/23/2022)   [place]    Feeling of Stress : Not at all  Social Connections: Socially Isolated (02/23/2022)   Social Connection and Isolation Panel [NHANES]    Frequency of Communication with Friends and Family: More than three times a week    Frequency of Social Gatherings with Friends and Family: More than three times  a week    Attends Religious Services: Never    Active Member of Clubs or Organizations: No    Attends Archivist Meetings: Never    Marital Status: Widowed    Tobacco Counseling Counseling given: Not Answered   Clinical Intake: Nutrition Risk Assessment:  Has the patient had any N/V/D within the last 2 months?  No  Does the patient have any non-healing wounds?  No  Has the patient had any unintentional weight loss or weight gain?  No   Diabetes:  Is the patient diabetic?  Yes  If diabetic, was a CBG obtained today?  No  Did the patient bring in their glucometer from home?  No  How often do you monitor your CBG's? PRN.   Financial Strains and Diabetes Management:  Are you having any financial strains with the device, your supplies or your medication? No .  Does the patient want to be seen by Chronic Care Management for management of their diabetes?  No  Would the patient like to be referred to a Nutritionist or for Diabetic Management?  No   Diabetic Exams:  Diabetic Eye Exam: Completed Yes. Overdue for diabetic eye exam. Pt has been advised about the importance in completing this exam. A referral has been placed today. Message sent to referral coordinator for scheduling purposes. Advised pt to expect a call from office referred to regarding appt.  Diabetic Foot Exam: Completed Yes. Pt has been advised about the  importance in completing this exam. Pt is scheduled for diabetic foot exam on Follow by PCP.   Pre-visit preparation completed: No Diabetic?  Yes  Interpreter Needed?: No Activities of Daily Living    02/23/2022    1:16 PM  In your present state of health, do you have any difficulty performing the following activities:  Hearing? 0  Vision? 0  Difficulty concentrating or making decisions? 0  Walking or climbing stairs? 0  Dressing or bathing? 0  Doing errands, shopping? 0  Preparing Food and eating ? N  Using the Toilet? N  In the past six months, have you accidently leaked urine? N  Do you have problems with loss of bowel control? N  Managing your Medications? N  Managing your Finances? N  Housekeeping or managing your Housekeeping? N    Patient Care Team: Biagio Borg, MD as PCP - Cheral Bay, MD as PCP - Cardiology (Cardiology) [place], Darnelle Maffucci, [facility] (Inactive) as Pharmacist (Pharmacist)  Indicate any recent Medical Services you may have received from other than [facility] providers in the past year (date may be approximate).     Assessment:   This is a routine wellness examination for [place].  Hearing/Vision screen Hearing Screening - Comments:: No hearing difficulty Vision Screening - Comments:: No vision difficulty  Dietary issues and exercise activities discussed: Exercise limited by: None identified   Goals Addressed               This Visit's Progress     <enter goal here> (pt-stated)        Stay healthy.       Depression Screen    02/23/2022    1:14 PM 01/02/2022    9:10 AM 07/04/2021    8:26 AM 01/02/2021    4:34 PM 01/02/2021    4:13 PM 12/28/2019    4:18 PM 12/28/2019    3:46 PM  PHQ 2/9 Scores  PHQ - 2 Score 0 0 0 0 0 1 0  PHQ- 9  Score 0 2         Fall Risk    02/23/2022    1:17 PM 01/02/2022    9:10 AM 07/04/2021    8:26 AM 01/02/2021    4:34 PM 01/02/2021    4:13 PM  Fall Risk   Falls in the past year? 0 0 0 0 0  Number falls  in past yr: 0 0 0  0  Injury with Fall? 0 0 0  0  Risk for fall due to : No Fall Risks        FALL RISK PREVENTION PERTAINING TO THE HOME:  Any stairs in or around the home? Yes  If so, are there any without handrails? No  Home free of loose throw rugs in walkways, pet beds, electrical cords, etc? Yes  Adequate lighting in your home to reduce risk of falls? Yes   ASSISTIVE DEVICES UTILIZED TO PREVENT FALLS:  Life alert? No  Use of a cane, walker or w/c? Yes  Grab bars in the bathroom? Yes  Shower chair or bench in shower? No  Elevated toilet seat or a handicapped toilet? Yes   TIMED UP AND GO:  Was the test performed? No . Audio Visit  Cognitive Function:      02/23/2022    1:17 PM  6CIT Screen  What Year? 0 points  What month? 0 points  What time? 0 points  Count back from 20 0 points  Months in reverse 0 points  Repeat phrase 0 points  Total Score 0 points    Immunizations Immunization History  Administered Date(s) Administered   Fluad Quad(high Dose 65+) 07/04/2021   PFIZER(Purple Top)SARS-COV-2 Vaccination 02/26/2020, 03/27/2020   Pneumococcal Polysaccharide-23 10/27/2013, 07/04/2021   Td 08/28/2008   Tdap 12/22/2018    TDAP status: Up to date  Flu Vaccine status: Up to date  Pneumococcal vaccine status: Up to date  Covid-19 vaccine status: Completed vaccines  Qualifies for Shingles Vaccine? Yes   Zostavax completed No   Shingrix Completed?: No.    Education has been provided regarding the importance of this vaccine. Patient has been advised to call insurance company to determine out of pocket expense if they have not yet received this vaccine. Advised may also receive vaccine at local pharmacy or Health Dept. Verbalized acceptance and understanding.  Screening Tests Health Maintenance  Topic Date Due   OPHTHALMOLOGY EXAM  Never done   Zoster Vaccines- Shingrix (1 of 2) 04/03/2022 (Originally 12/16/1974)   COLONOSCOPY (Pts 45-24yr Insurance  coverage will need to be confirmed)  01/04/2023 (Originally 04/02/2012)   HEMOGLOBIN A1C  06/30/2022   Pneumonia Vaccine 67 Years old (2 - PCV) 07/04/2022   FOOT EXAM  01/03/2023   TETANUS/TDAP  12/21/2028   Hepatitis C Screening  Completed   HPV VACCINES  Aged Out   INFLUENZA VACCINE  Discontinued   COVID-19 Vaccine  Discontinued    Health Maintenance  Health Maintenance Due  Topic Date Due   OPHTHALMOLOGY EXAM  Never done    Colorectal cancer screening: Type of screening: Colonoscopy. Completed 04/02/10. Repeat every   years  Lung Cancer Screening: (Low Dose CT Chest recommended if Age 67-80years, 30 pack-year currently smoking OR have quit w/in 15years.) does not qualify.     Additional Screening:  Hepatitis C Screening: does qualify; Completed 06/17/18  Vision Screening: Recommended annual ophthalmology exams for early detection of glaucoma and other disorders of the eye. Is the patient up to date with their annual eye  exam?  No  Who is the provider or what is the name of the office in which the patient attends annual eye exams? Patient deferred If pt is not established with a provider, would they like to be referred to a provider to establish care? No .   Dental Screening: Recommended annual dental exams for proper oral hygiene  Community Resource Referral / Chronic Care Management:   CRR required this visit?  No   CCM required this visit?  No      Plan:     I have personally reviewed and noted the following in the patient's chart:   Medical and social history Use of alcohol, tobacco or illicit drugs  Current medications and supplements including opioid prescriptions. Patient is not currently taking opioid prescriptions. Functional ability and status Nutritional status Physical activity Advanced directives List of other physicians Hospitalizations, surgeries, and ER visits in previous 12 months Vitals Screenings to include cognitive, depression, and  falls Referrals and appointments  In addition, I have reviewed and discussed with patient certain preventive protocols, quality metrics, and best practice recommendations. A written personalized care plan for preventive services as well as general preventive health recommendations were provided to patient.     Criselda Peaches, LPN   [id number]   Nurse Notes: None

## 2022-05-01 IMAGING — CR DG CHEST 2V
2 series · 2 of 2 positions shown · non-contrast
Comparison: 12/22/2004

CLINICAL DATA: Chest pain and hypertension 2 days with shortness of
breath.

EXAM:
CHEST - 2 VIEW

[chest lat]
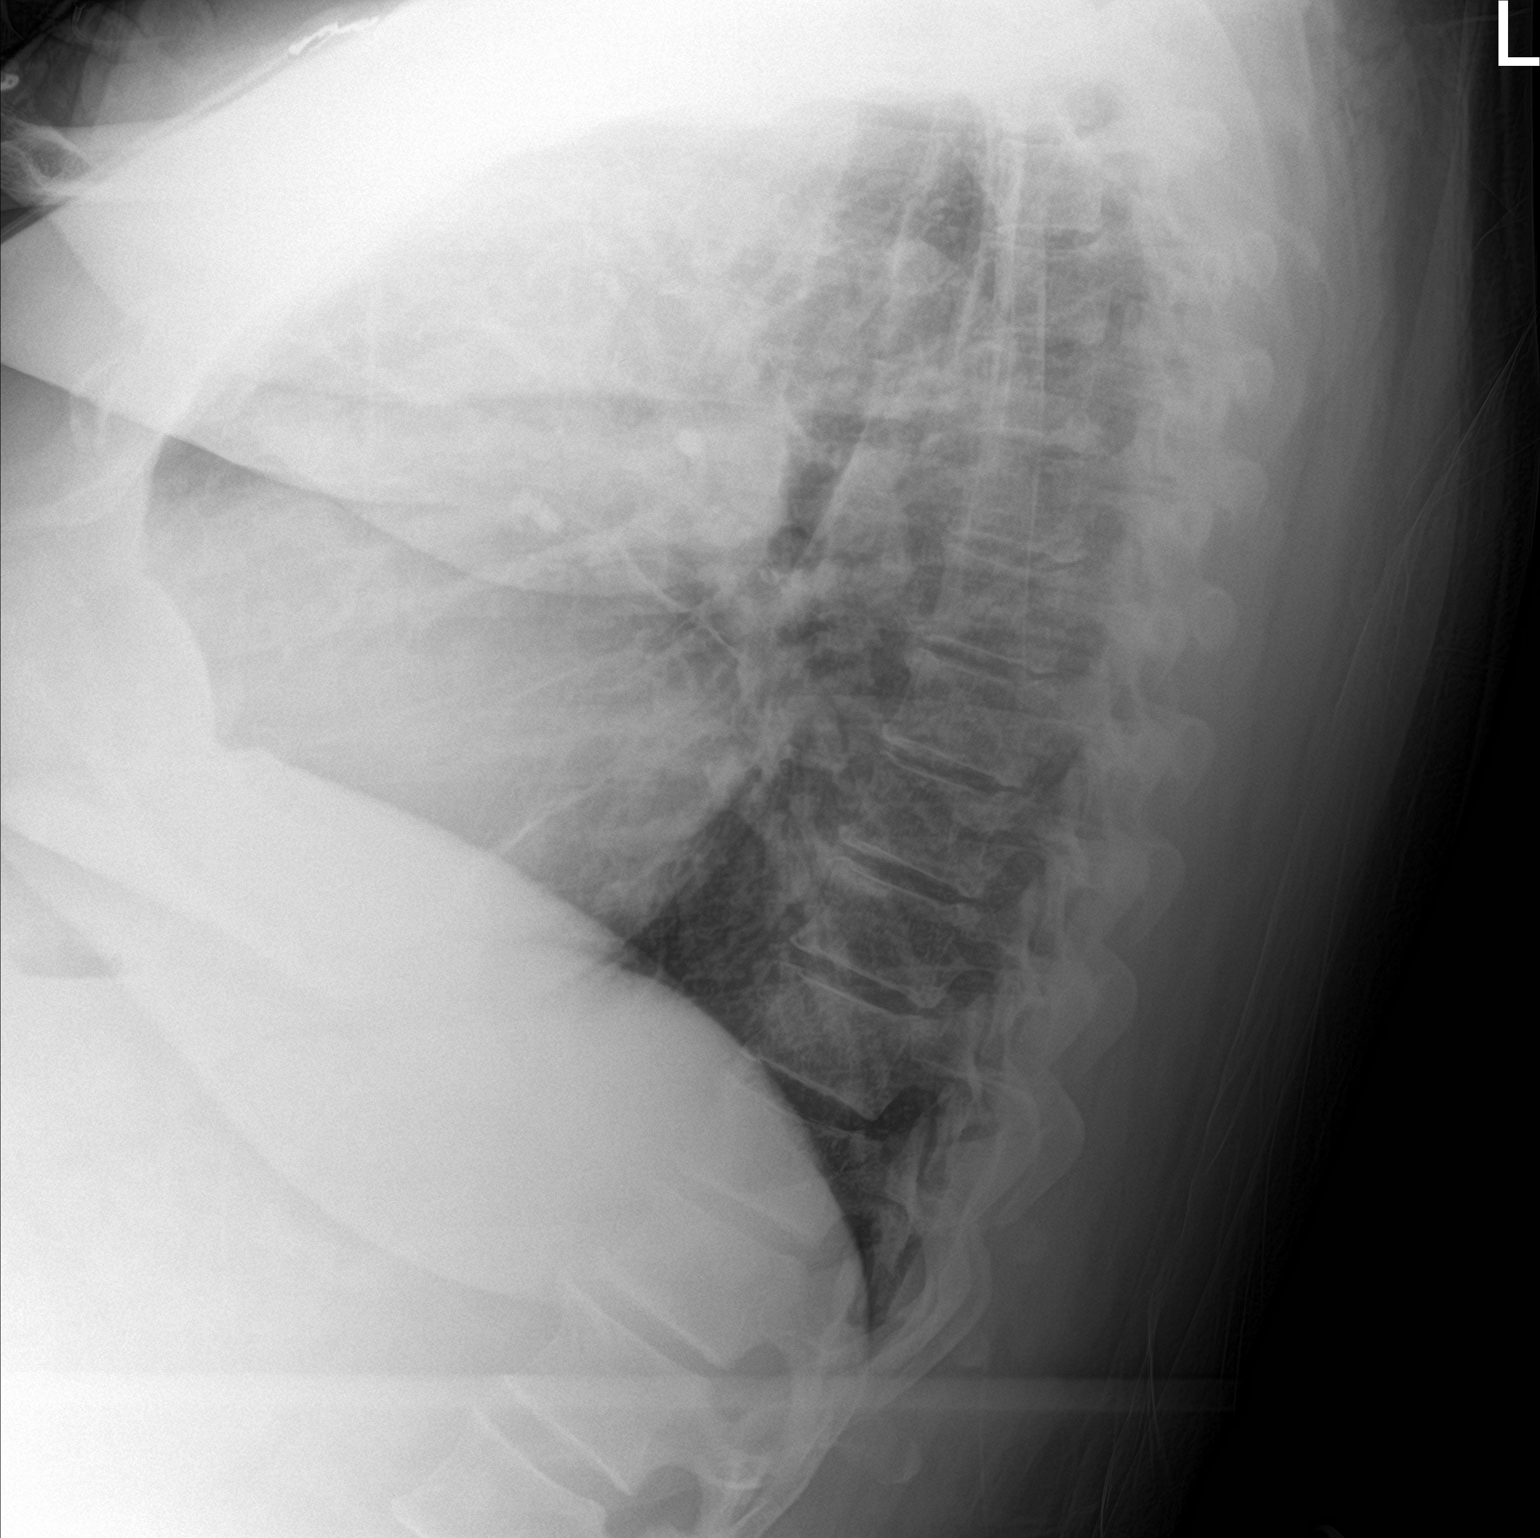

[chest ap]
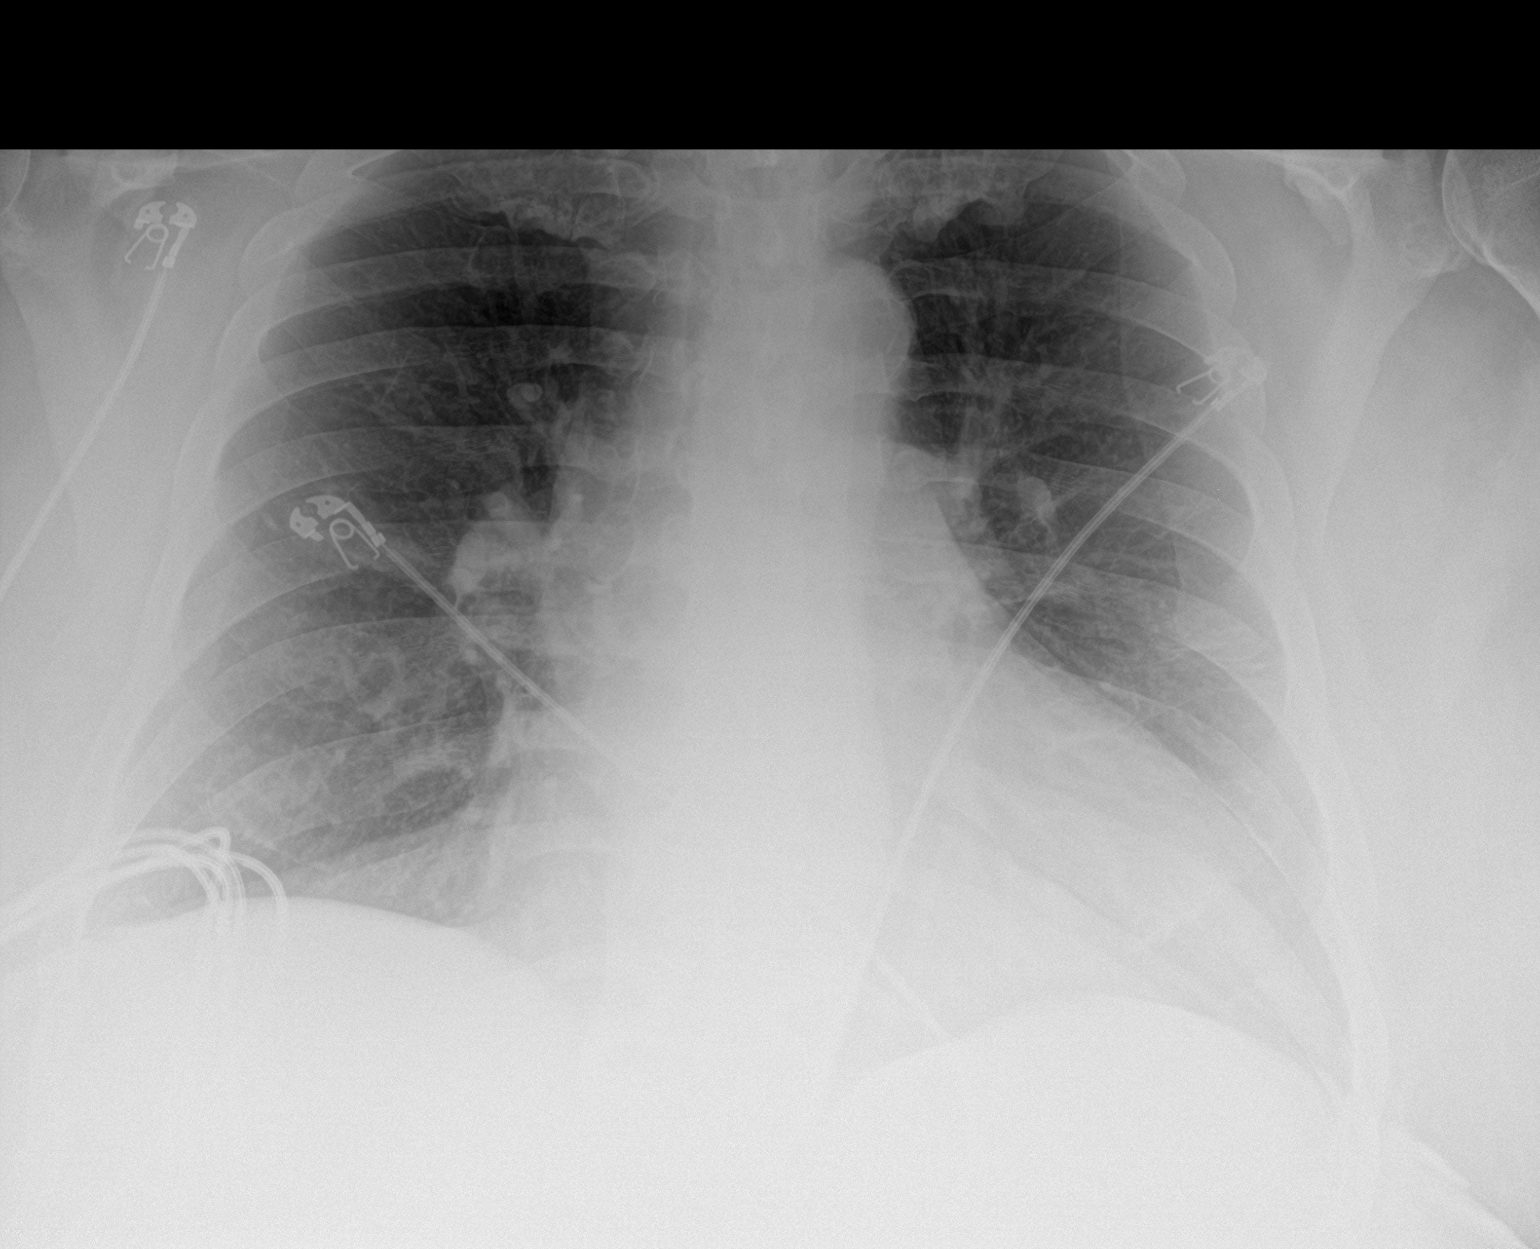

[2 of 2 positions shown; findings below may reference images not displayed]

FINDINGS: Lungs are adequately inflated without focal airspace consolidation
or effusion. Mild stable cardiomegaly. Remainder of the exam is
unchanged.
IMPRESSION: No active cardiopulmonary disease.

## 2022-05-05 ENCOUNTER — Other Ambulatory Visit: Payer: Self-pay | Admitting: Internal Medicine

## 2022-05-05 NOTE — Telephone Encounter (Signed)
,  Please refill as per office routine med refill policy (all routine meds to be refilled for 3 mo or monthly (per pt preference) up to one year from last visit, then month to month grace period for 3 mo, then further med refills will have to be denied)

## 2022-05-14 ENCOUNTER — Telehealth: Payer: Self-pay | Admitting: Internal Medicine

## 2022-05-14 ENCOUNTER — Other Ambulatory Visit: Payer: Self-pay | Admitting: Internal Medicine

## 2022-05-14 NOTE — Telephone Encounter (Signed)
Patient has scheduled visit for 10/24 - he would like labs done before this appointment.  Please let patient know when labs are ordered.

## 2022-05-14 NOTE — Telephone Encounter (Signed)
Yes, and labs are already ordered  [facility] to see the Order Review tab.   thanks

## 2022-05-14 NOTE — Telephone Encounter (Signed)
He should have labs done 3-5 days before his visit, correct?

## 2022-05-14 NOTE — Telephone Encounter (Signed)
Please refill as per office routine med refill policy (all routine meds to be refilled for 3 mo or monthly (per pt preference) up to one year from last visit, then month to month grace period for 3 mo, then further med refills will have to be denied) ? ?

## 2022-06-20 ENCOUNTER — Other Ambulatory Visit: Payer: Self-pay | Admitting: Internal Medicine

## 2022-06-20 NOTE — Telephone Encounter (Signed)
Please refill as per office routine med refill policy (all routine meds to be refilled for 3 mo or monthly (per pt preference) up to one year from last visit, then month to month grace period for 3 mo, then further med refills will have to be denied) ? ?

## 2022-06-25 ENCOUNTER — Telehealth: Payer: Self-pay

## 2022-06-25 NOTE — Telephone Encounter (Signed)
Patient is calling in asking if he can he come in tomorrow for labs prior to his appt on Tuesday.

## 2022-06-30 ENCOUNTER — Ambulatory Visit: Payer: Medicare HMO | Admitting: Internal Medicine

## 2022-07-06 ENCOUNTER — Other Ambulatory Visit: Payer: Self-pay | Admitting: Internal Medicine

## 2022-07-06 NOTE — Telephone Encounter (Signed)
Please refill as per office routine med refill policy (all routine meds to be refilled for 3 mo or monthly (per pt preference) up to one year from last visit, then month to month grace period for 3 mo, then further med refills will have to be denied) ? ?

## 2022-07-15 ENCOUNTER — Ambulatory Visit (INDEPENDENT_AMBULATORY_CARE_PROVIDER_SITE_OTHER): Payer: Medicare HMO | Admitting: Internal Medicine

## 2022-07-15 VITALS — BP 166/92 | HR 67 | Temp 98.2°F | Ht 72.0 in | Wt 378.0 lb

## 2022-07-15 DIAGNOSIS — Z125 Encounter for screening for malignant neoplasm of prostate: Secondary | ICD-10-CM | POA: Diagnosis not present

## 2022-07-15 DIAGNOSIS — E7800 Pure hypercholesterolemia, unspecified: Secondary | ICD-10-CM

## 2022-07-15 DIAGNOSIS — I10 Essential (primary) hypertension: Secondary | ICD-10-CM

## 2022-07-15 DIAGNOSIS — E538 Deficiency of other specified B group vitamins: Secondary | ICD-10-CM

## 2022-07-15 DIAGNOSIS — E669 Obesity, unspecified: Secondary | ICD-10-CM

## 2022-07-15 DIAGNOSIS — E1169 Type 2 diabetes mellitus with other specified complication: Secondary | ICD-10-CM | POA: Diagnosis not present

## 2022-07-15 DIAGNOSIS — N1831 Chronic kidney disease, stage 3a: Secondary | ICD-10-CM

## 2022-07-15 DIAGNOSIS — E559 Vitamin D deficiency, unspecified: Secondary | ICD-10-CM

## 2022-07-15 LAB — POCT GLYCOSYLATED HEMOGLOBIN (HGB A1C): Hemoglobin A1C: 7 % — AB (ref 4.0–5.6)

## 2022-07-15 MED ORDER — METFORMIN HCL 500 MG PO TABS: 1000.0000 mg | ORAL_TABLET | Freq: Two times a day (BID) | ORAL | 3 refills | Status: DC

## 2022-07-15 NOTE — Assessment & Plan Note (Addendum)
BP Readings from Last 3 Encounters:  07/15/22 (!) 166/92  02/23/22 (!) 151/78  01/02/22 136/78   Uncontrolled despite some recent wt loss, ? Renal related,, pt to continue medical treatment - 5 med regiemen as below as declines change today  Current Outpatient Medications (Endocrine & Metabolic):    Dulaglutide (TRULICITY) 6.57 QI/6.9GE SOPN, Inject 0.75 mg into the skin once a week.   metFORMIN (GLUCOPHAGE) 500 MG tablet, Take 2 tablets (1,000 mg total) by mouth 2 (two) times daily with a meal.  Current Outpatient Medications (Cardiovascular):    amLODipine (NORVASC) 10 MG tablet, TAKE 1 TABLET EVERY DAY   atorvastatin (LIPITOR) 10 MG tablet, TAKE 1 TABLET EVERY DAY   carvedilol (COREG) 12.5 MG tablet, TAKE 1 TABLET TWICE DAILY WITH MEALS   cloNIDine (CATAPRES) 0.2 MG tablet, TAKE 1 TABLET TWICE DAILY   diltiazem (CARDIZEM CD) 180 MG 24 hr capsule, TAKE 1 CAPSULE EVERY DAY   furosemide (LASIX) 80 MG tablet, Take 1 tablet (80 mg total) by mouth daily.   telmisartan (MICARDIS) 80 MG tablet, TAKE 1 TABLET EVERY DAY   Current Outpatient Medications (Analgesics):    allopurinol (ZYLOPRIM) 100 MG tablet, TAKE 1 TABLET EVERY DAY  Current Outpatient Medications (Hematological):    apixaban (ELIQUIS) 5 MG TABS tablet, TAKE 1 TABLET TWICE DAILY   vitamin B-12 (CYANOCOBALAMIN) 1000 MCG tablet, Take 1 tablet (1,000 mcg total) by mouth daily.  Current Outpatient Medications (Other):    Cholecalciferol (THERA-D 2000) 50 MCG (2000 UT) TABS, 1 tab by mouth once daily   potassium chloride SA (KLOR-CON) 20 MEQ tablet, TAKE 1 TABLET EVERY DAY

## 2022-07-15 NOTE — Telephone Encounter (Signed)
Pt is seen today at 07/15/22 @ 1:40 pm

## 2022-07-15 NOTE — Assessment & Plan Note (Signed)
Lab Results  Component Value Date   HGBA1C 8.4 (H) 12/29/2021   Uncontrolled, now on trulicity and metfomrin 1000 bid,, pt to continue current medical treatment, for A1c today

## 2022-07-15 NOTE — Assessment & Plan Note (Signed)
D/w pt - needs regular fu with renal, has not been seen for over 1 yr, pt states will call for f/u appt  Lab Results  Component Value Date   CREATININE 2.24 (H) 12/29/2021

## 2022-07-15 NOTE — Assessment & Plan Note (Signed)
Lab Results  Component Value Date   LDLCALC 61 12/29/2021   Stable, pt to continue current statin lipitor 10 mg qd

## 2022-07-15 NOTE — Addendum Note (Signed)
Addended by: Max Sane on: 07/15/2022 03:02 PM   Modules accepted: Orders

## 2022-07-15 NOTE — Patient Instructions (Signed)
Your A1c was done today  Please see your nephrology for kidney function follow up  Please continue all other medications as before, and refills have been done if requested.  Please have the pharmacy call with any other refills you may need.  Please continue your efforts at being more active, low cholesterol diet, and weight control.  Please keep your appointments with your specialists as you may have planned  Please make an Appointment to return in 6 months, or sooner if needed, also with Lab Appointment for testing done 3-5 days before at the [facility] (so this is for TWO appointments - please see the scheduling desk as you leave)

## 2022-07-15 NOTE — Progress Notes (Addendum)
Patient ID: Gerald Day, male   DOB: [birth year], 66 y.o.   MRN: [id number]        Chief Complaint: follow up HTN, HLD and DM, ckd       HPI:  Gerald Day is a 66 y.o. male here overall doing well and tolerating the trulicity, though would rather be on ozempic.  Pt denies chest pain, increased sob or doe, wheezing, orthopnea, PND, increased LE swelling, palpitations, dizziness or syncope.   Pt denies polydipsia, polyuria, or new focal neuro s/s.   Pt continues to have recurring LBP without change in severity, bowel or bladder change, fever, wt loss,  worsening LE pain/numbness/weakness, gait change or falls  Has lost several lbs.  Needs metformin renewal Wt Readings from Last 3 Encounters:  07/15/22 (!) 378 lb (171.5 kg)  02/23/22 (!) 385 lb (174.6 kg)  01/02/22 (!) 385 lb (174.6 kg)   BP Readings from Last 3 Encounters:  07/15/22 (!) 166/92  02/23/22 (!) 151/78  01/02/22 136/78         Past Medical History:  Diagnosis Date   Arthritis    IN KNEES   Cellulitis 10/26/2013   LOWER RT EXTREMITY   CELLULITIS/ABSCESS, LEG 06/27/2007   COLONIC POLYPS    CONGESTIVE HEART FAILURE    DIABETES MELLITUS, TYPE II    DIVERTICULOSIS, COLON    ERECTILE DYSFUNCTION, ORGANIC    GOUT NOS    Headache    HYPERLIPIDEMIA    HYPERTENSION    LOW BACK PAIN    Morbid obesity ([place])    SLEEP APNEA, OBSTRUCTIVE    USES CPAP   Past Surgical History:  Procedure Laterality Date   APPENDECTOMY     CARDIOVERSION N/A 08/02/2020   Procedure: CARDIOVERSION;  Surgeon: Sanda Klein, MD;  Location: [place];  Service: Cardiovascular;  Laterality: N/A;   FOOT SURGERY     LT   TEE WITHOUT CARDIOVERSION N/A 08/02/2020   Procedure: TRANSESOPHAGEAL ECHOCARDIOGRAM (TEE);  Surgeon: Sanda Klein, MD;  Location: [facility] ENDOSCOPY;  Service: Cardiovascular;  Laterality: N/A;   TENOTOMY / FLEXOR TENDON TRANSFER Right 03/15/2015   Procedure: TENOTOMY HT REPAIR/ULCER DEBRIDEMENT/GRAFT PREP/ACELL GRAFT  APPLICATION;  Surgeon: Jana Half, DPM;  Location: [place];  Service: Podiatry;  Laterality: Right;  HALLUX    reports that he has never smoked. He has never used smokeless tobacco. He reports that he does not currently use alcohol after a past usage of about 3.0 - 4.0 standard drinks of alcohol per week. He reports that he does not use drugs. family history includes Asthma in an other family member; Cardiomyopathy in his father. Allergies  Allergen Reactions   Penicillins Other (See Comments)    Unknown childhood allergic reaction Has patient had a PCN reaction causing immediate rash, facial/tongue/throat swelling, SOB or lightheadedness with hypotension: Unknown Has patient had a PCN reaction causing severe rash involving mucus membranes or skin necrosis: Unknown Has patient had a PCN reaction that required hospitalization: No Has patient had a PCN reaction occurring within the last 10 years: No If all of the above answers are "NO", then may proceed with Cephalosporin use.    Tizanidine Other (See Comments)    07/26/20: Pt does not recognize drug and does not remember being allergic to it.   Current Outpatient Medications on File Prior to Visit  Medication Sig Dispense Refill   allopurinol (ZYLOPRIM) 100 MG tablet TAKE 1 TABLET EVERY DAY 90 tablet 0   amLODipine (NORVASC) 10 MG tablet TAKE  1 TABLET EVERY DAY 90 tablet 3   apixaban (ELIQUIS) 5 MG TABS tablet TAKE 1 TABLET TWICE DAILY 180 tablet 2   atorvastatin (LIPITOR) 10 MG tablet TAKE 1 TABLET EVERY DAY 90 tablet 3   carvedilol (COREG) 12.5 MG tablet TAKE 1 TABLET TWICE DAILY WITH MEALS 180 tablet 3   Cholecalciferol (THERA-D 2000) 50 MCG (2000 UT) TABS 1 tab by mouth once daily 90 tablet 99   cloNIDine (CATAPRES) 0.2 MG tablet TAKE 1 TABLET TWICE DAILY 180 tablet 2   diltiazem (CARDIZEM CD) 180 MG 24 hr capsule TAKE 1 CAPSULE EVERY DAY 90 capsule 0   Dulaglutide (TRULICITY) 0.25 [place]/7.0WC SOPN Inject 0.75 mg into the skin once a  week. 6 mL 3   furosemide (LASIX) 80 MG tablet Take 1 tablet (80 mg total) by mouth daily. 90 tablet 3   potassium chloride SA (KLOR-CON) 20 MEQ tablet TAKE 1 TABLET EVERY DAY 90 tablet 3   telmisartan (MICARDIS) 80 MG tablet TAKE 1 TABLET EVERY DAY 90 tablet 3   vitamin B-12 (CYANOCOBALAMIN) 1000 MCG tablet Take 1 tablet (1,000 mcg total) by mouth daily. 90 tablet 3   No current facility-administered medications on file prior to visit.        ROS:  All others reviewed and negative.  Objective        PE:  BP (!) 166/92 (BP Location: Left Arm, Patient Position: Sitting, Cuff Size: Large)   Pulse 67   Temp 98.2 F (36.8 C) (Oral)   Ht 6' (1.829 m)   Wt (!) 378 lb (171.5 kg)   SpO2 96%   BMI 51.27 kg/m                 Constitutional: Pt appears in NAD               HENT: Head: NCAT.                Right Ear: External ear normal.                 Left Ear: External ear normal.                Eyes: . Pupils are equal, round, and reactive to light. Conjunctivae and EOM are normal               Nose: without d/c or deformity               Neck: Neck supple. Gross normal ROM               Cardiovascular: Normal rate and regular rhythm.                 Pulmonary/Chest: Effort normal and breath sounds without rales or wheezing.                Abd:  Soft, NT, ND, + BS, no organomegaly               Neurological: Pt is alert. At baseline orientation, motor grossly intact               Skin: Skin is warm. No rashes, no other new lesions, LE edema - chronic 1+ bilateral               Psychiatric: Pt behavior is normal without agitation   Micro: none  Cardiac tracings I have personally interpreted today:  none  Pertinent Radiological findings (summarize): none   Lab Results  Component Value Date   WBC 6.6 06/30/2021   HGB 12.0 (L) 06/30/2021   HCT 35.8 (L) 06/30/2021   PLT 214.0 06/30/2021   GLUCOSE 293 (H) 12/29/2021   CHOL 138 12/29/2021   TRIG 164.0 (H) 12/29/2021   HDL 44.10  12/29/2021   LDLDIRECT 83.0 12/19/2018   LDLCALC 61 12/29/2021   ALT 12 12/29/2021   AST 10 12/29/2021   NA 139 12/29/2021   K 5.1 12/29/2021   CL 105 12/29/2021   CREATININE 2.24 (H) 12/29/2021   BUN 35 (H) 12/29/2021   CO2 25 12/29/2021   TSH 4.30 12/31/2020   PSA 0.35 12/31/2020   INR 1.4 (H) 07/26/2020   HGBA1C 8.4 (H) 12/29/2021   MICROALBUR 159.7 (H) 12/31/2020   Hemoglobin A1C 4.0 - 5.6 % 7.0 Abnormal  8.4 High  R, CM 6.0    Assessment/Plan:  Gerald Day is a 66 y.o. White or Caucasian [1] male with  has a past medical history of Arthritis, Cellulitis (10/26/2013), CELLULITIS/ABSCESS, LEG (06/27/2007), COLONIC POLYPS, CONGESTIVE HEART FAILURE, DIABETES MELLITUS, TYPE II, DIVERTICULOSIS, COLON, ERECTILE DYSFUNCTION, ORGANIC, GOUT NOS, Headache, HYPERLIPIDEMIA, HYPERTENSION, LOW BACK PAIN, Morbid obesity ([place]), and SLEEP APNEA, OBSTRUCTIVE.  Stage 3a chronic kidney disease (HCC) D/w pt - needs regular fu with renal, has not been seen for over 1 yr, pt states will call for f/u appt  Lab Results  Component Value Date   CREATININE 2.24 (H) 12/29/2021     Hyperlipidemia Lab Results  Component Value Date   LDLCALC 61 12/29/2021   Stable, pt to continue current statin lipitor 10 mg qd   Essential hypertension BP Readings from Last 3 Encounters:  07/15/22 (!) 166/92  02/23/22 (!) 151/78  01/02/22 136/78   Uncontrolled despite some recent wt loss, ? Renal related,, pt to continue medical treatment - 5 med regiemen as below as declines change today  Current Outpatient Medications (Endocrine & Metabolic):    Dulaglutide (TRULICITY) 4.40 NU/2.7OZ SOPN, Inject 0.75 mg into the skin once a week.   metFORMIN (GLUCOPHAGE) 500 MG tablet, Take 2 tablets (1,000 mg total) by mouth 2 (two) times daily with a meal.  Current Outpatient Medications (Cardiovascular):    amLODipine (NORVASC) 10 MG tablet, TAKE 1 TABLET EVERY DAY   atorvastatin (LIPITOR) 10 MG tablet, TAKE 1  TABLET EVERY DAY   carvedilol (COREG) 12.5 MG tablet, TAKE 1 TABLET TWICE DAILY WITH MEALS   cloNIDine (CATAPRES) 0.2 MG tablet, TAKE 1 TABLET TWICE DAILY   diltiazem (CARDIZEM CD) 180 MG 24 hr capsule, TAKE 1 CAPSULE EVERY DAY   furosemide (LASIX) 80 MG tablet, Take 1 tablet (80 mg total) by mouth daily.   telmisartan (MICARDIS) 80 MG tablet, TAKE 1 TABLET EVERY DAY   Current Outpatient Medications (Analgesics):    allopurinol (ZYLOPRIM) 100 MG tablet, TAKE 1 TABLET EVERY DAY  Current Outpatient Medications (Hematological):    apixaban (ELIQUIS) 5 MG TABS tablet, TAKE 1 TABLET TWICE DAILY   vitamin B-12 (CYANOCOBALAMIN) 1000 MCG tablet, Take 1 tablet (1,000 mcg total) by mouth daily.  Current Outpatient Medications (Other):    Cholecalciferol (THERA-D 2000) 50 MCG (2000 UT) TABS, 1 tab by mouth once daily   potassium chloride SA (KLOR-CON) 20 MEQ tablet, TAKE 1 TABLET EVERY DAY   Diabetes mellitus type 2 in obese [facility]) Lab Results  Component Value Date   HGBA1C 8.4 (H) 12/29/2021   Uncontrolled, now on trulicity and metfomrin 1000 bid,, pt to continue current medical treatment, for A1c today  Followup: No follow-ups on file.  Cathlean Cower, MD 07/15/2022 2:34 PM [facility]

## 2022-09-05 ENCOUNTER — Other Ambulatory Visit: Payer: Self-pay | Admitting: Internal Medicine

## 2022-09-05 NOTE — Telephone Encounter (Signed)
Please refill as per office routine med refill policy (all routine meds to be refilled for 3 mo or monthly (per pt preference) up to one year from last visit, then month to month grace period for 3 mo, then further med refills will have to be denied) ? ?

## 2022-09-28 ENCOUNTER — Other Ambulatory Visit: Payer: Self-pay | Admitting: Internal Medicine

## 2022-09-28 NOTE — Telephone Encounter (Signed)
Please refill as per office routine med refill policy (all routine meds to be refilled for 3 mo or monthly (per pt preference) up to one year from last visit, then month to month grace period for 3 mo, then further med refills will have to be denied)

## 2022-10-07 ENCOUNTER — Other Ambulatory Visit: Payer: Self-pay | Admitting: Internal Medicine

## 2022-10-07 NOTE — Telephone Encounter (Signed)
Please refill as per office routine med refill policy (all routine meds to be refilled for 3 mo or monthly (per pt preference) up to one year from last visit, then month to month grace period for 3 mo, then further med refills will have to be denied)

## 2022-10-31 ENCOUNTER — Other Ambulatory Visit: Payer: Self-pay | Admitting: Internal Medicine

## 2022-10-31 NOTE — Telephone Encounter (Signed)
Please refill as per office routine med refill policy (all routine meds to be refilled for 3 mo or monthly (per pt preference) up to one year from last visit, then month to month grace period for 3 mo, then further med refills will have to be denied) ? ?

## 2022-12-08 ENCOUNTER — Other Ambulatory Visit: Payer: Self-pay | Admitting: Internal Medicine

## 2022-12-16 ENCOUNTER — Telehealth: Payer: Self-pay | Admitting: Internal Medicine

## 2022-12-16 NOTE — Telephone Encounter (Signed)
Prescription Request  12/16/2022  LOV: 07/15/2022  What is the name of the medication or equipment? allopurinol (ZYLOPRIM) 100 MG tablet   diltiazem (CARDIZEM CD) 180 MG 24 hr capsule  Have you contacted your pharmacy to request a refill? No   Which pharmacy would you like this sent to?      [facility] - [place], [place] - [address] [place] [address] Phone: [phone] Fax: [phone]  Patient notified that their request is being sent to the clinical staff for review and that they should receive a response within 2 business days.   Please advise at Mobile [phone] (mobile)

## 2023-01-01 ENCOUNTER — Other Ambulatory Visit: Payer: Self-pay

## 2023-01-01 MED ORDER — DILTIAZEM HCL ER COATED BEADS 180 MG PO CP24: 180.0000 mg | ORAL_CAPSULE | Freq: Every day | ORAL | 0 refills | Status: DC

## 2023-01-01 MED ORDER — ALLOPURINOL 100 MG PO TABS: 100.0000 mg | ORAL_TABLET | Freq: Every day | ORAL | 0 refills | Status: DC

## 2023-01-06 ENCOUNTER — Other Ambulatory Visit (INDEPENDENT_AMBULATORY_CARE_PROVIDER_SITE_OTHER): Payer: Medicare HMO

## 2023-01-06 DIAGNOSIS — E538 Deficiency of other specified B group vitamins: Secondary | ICD-10-CM | POA: Diagnosis not present

## 2023-01-06 DIAGNOSIS — Z125 Encounter for screening for malignant neoplasm of prostate: Secondary | ICD-10-CM

## 2023-01-06 DIAGNOSIS — E559 Vitamin D deficiency, unspecified: Secondary | ICD-10-CM | POA: Diagnosis not present

## 2023-01-06 DIAGNOSIS — E1169 Type 2 diabetes mellitus with other specified complication: Secondary | ICD-10-CM

## 2023-01-06 DIAGNOSIS — E669 Obesity, unspecified: Secondary | ICD-10-CM

## 2023-01-06 LAB — HEPATIC FUNCTION PANEL
ALT: 15 U/L (ref 0–53)
AST: 13 U/L (ref 0–37)
Albumin: 3.4 g/dL — ABNORMAL LOW (ref 3.5–5.2)
Alkaline Phosphatase: 101 U/L (ref 39–117)
Bilirubin, Direct: 0.1 mg/dL (ref 0.0–0.3)
Total Bilirubin: 0.4 mg/dL (ref 0.2–1.2)
Total Protein: 6.4 g/dL (ref 6.0–8.3)

## 2023-01-06 LAB — URINALYSIS, ROUTINE W REFLEX MICROSCOPIC
Bilirubin Urine: NEGATIVE
Ketones, ur: NEGATIVE
Leukocytes,Ua: NEGATIVE
Nitrite: NEGATIVE
Specific Gravity, Urine: 1.02 (ref 1.000–1.030)
Total Protein, Urine: >=300 — AB
Urine Glucose: 100 — AB
Urobilinogen, UA: 0.2 (ref 0.0–1.0)
pH: 7 (ref 5.0–8.0)

## 2023-01-06 LAB — VITAMIN B12: Vitamin B-12: 925 pg/mL — ABNORMAL HIGH (ref 211–911)

## 2023-01-06 LAB — CBC WITH DIFFERENTIAL/PLATELET
Basophils Absolute: 0.1 10*3/uL (ref 0.0–0.1)
Basophils Relative: 0.9 % (ref 0.0–3.0)
Eosinophils Absolute: 0.4 10*3/uL (ref 0.0–0.7)
Eosinophils Relative: 5.6 % — ABNORMAL HIGH (ref 0.0–5.0)
HCT: 34.6 % — ABNORMAL LOW (ref 39.0–52.0)
Hemoglobin: 11.7 g/dL — ABNORMAL LOW (ref 13.0–17.0)
Lymphocytes Relative: 14.8 % (ref 12.0–46.0)
Lymphs Abs: 1.1 10*3/uL (ref 0.7–4.0)
MCHC: 33.8 g/dL (ref 30.0–36.0)
MCV: 92.8 fl (ref 78.0–100.0)
Monocytes Absolute: 0.7 10*3/uL (ref 0.1–1.0)
Monocytes Relative: 8.7 % (ref 3.0–12.0)
Neutro Abs: 5.3 10*3/uL (ref 1.4–7.7)
Neutrophils Relative %: 70 % (ref 43.0–77.0)
Platelets: 231 10*3/uL (ref 150.0–400.0)
RBC: 3.73 Mil/uL — ABNORMAL LOW (ref 4.22–5.81)
RDW: 14.5 % (ref 11.5–15.5)
WBC: 7.5 10*3/uL (ref 4.0–10.5)

## 2023-01-06 LAB — LIPID PANEL
Cholesterol: 153 mg/dL (ref 0–200)
HDL: 43.7 mg/dL (ref >39.00)
LDL Cholesterol: 84 mg/dL (ref 0–99)
NonHDL: 109.25
Total CHOL/HDL Ratio: 4
Triglycerides: 124 mg/dL (ref 0.0–149.0)
VLDL: 24.8 mg/dL (ref 0.0–40.0)

## 2023-01-06 LAB — BASIC METABOLIC PANEL
BUN: 32 mg/dL — ABNORMAL HIGH (ref 6–23)
CO2: 25 mEq/L (ref 19–32)
Calcium: 8.6 mg/dL (ref 8.4–10.5)
Chloride: 109 mEq/L (ref 96–112)
Creatinine, Ser: 2.17 mg/dL — ABNORMAL HIGH (ref 0.40–1.50)
GFR: 30.62 mL/min — ABNORMAL LOW (ref >60.00)
Glucose, Bld: 139 mg/dL — ABNORMAL HIGH (ref 70–99)
Potassium: 5.2 mEq/L — ABNORMAL HIGH (ref 3.5–5.1)
Sodium: 141 mEq/L (ref 135–145)

## 2023-01-06 LAB — MICROALBUMIN / CREATININE URINE RATIO
Creatinine,U: 61.9 mg/dL
Microalb Creat Ratio: 154 mg/g — ABNORMAL HIGH (ref 0.0–30.0)
Microalb, Ur: 95.3 mg/dL — ABNORMAL HIGH (ref 0.0–1.9)

## 2023-01-06 LAB — PSA: PSA: 0.83 ng/mL (ref 0.10–4.00)

## 2023-01-06 LAB — VITAMIN D 25 HYDROXY (VIT D DEFICIENCY, FRACTURES): VITD: 16.43 ng/mL — ABNORMAL LOW (ref 30.00–100.00)

## 2023-01-06 LAB — TSH: TSH: 1.88 u[IU]/mL (ref 0.35–5.50)

## 2023-01-06 LAB — HEMOGLOBIN A1C: Hgb A1c MFr Bld: 6.8 % — ABNORMAL HIGH (ref 4.6–6.5)

## 2023-01-12 ENCOUNTER — Other Ambulatory Visit: Payer: Self-pay | Admitting: Internal Medicine

## 2023-01-13 ENCOUNTER — Ambulatory Visit (INDEPENDENT_AMBULATORY_CARE_PROVIDER_SITE_OTHER): Payer: Medicare HMO | Admitting: Internal Medicine

## 2023-01-13 ENCOUNTER — Encounter: Payer: Self-pay | Admitting: Internal Medicine

## 2023-01-13 VITALS — BP 132/84 | HR 42 | Temp 98.3°F | Ht 72.0 in | Wt 377.0 lb

## 2023-01-13 DIAGNOSIS — E538 Deficiency of other specified B group vitamins: Secondary | ICD-10-CM

## 2023-01-13 DIAGNOSIS — E669 Obesity, unspecified: Secondary | ICD-10-CM | POA: Diagnosis not present

## 2023-01-13 DIAGNOSIS — E7800 Pure hypercholesterolemia, unspecified: Secondary | ICD-10-CM

## 2023-01-13 DIAGNOSIS — N1831 Chronic kidney disease, stage 3a: Secondary | ICD-10-CM | POA: Diagnosis not present

## 2023-01-13 DIAGNOSIS — I10 Essential (primary) hypertension: Secondary | ICD-10-CM | POA: Diagnosis not present

## 2023-01-13 DIAGNOSIS — Z0001 Encounter for general adult medical examination with abnormal findings: Secondary | ICD-10-CM | POA: Diagnosis not present

## 2023-01-13 DIAGNOSIS — Z7984 Long term (current) use of oral hypoglycemic drugs: Secondary | ICD-10-CM

## 2023-01-13 DIAGNOSIS — E559 Vitamin D deficiency, unspecified: Secondary | ICD-10-CM | POA: Diagnosis not present

## 2023-01-13 DIAGNOSIS — Z23 Encounter for immunization: Secondary | ICD-10-CM

## 2023-01-13 DIAGNOSIS — E1169 Type 2 diabetes mellitus with other specified complication: Secondary | ICD-10-CM | POA: Diagnosis not present

## 2023-01-13 MED ORDER — DILTIAZEM HCL ER COATED BEADS 180 MG PO CP24: 180.0000 mg | ORAL_CAPSULE | Freq: Every day | ORAL | 3 refills | Status: DC

## 2023-01-13 MED ORDER — TIRZEPATIDE 2.5 MG/0.5ML ~~LOC~~ SOAJ: 2.5000 mg | SUBCUTANEOUS | 3 refills | Status: DC

## 2023-01-13 MED ORDER — POTASSIUM CHLORIDE CRYS ER 20 MEQ PO TBCR: 20.0000 meq | EXTENDED_RELEASE_TABLET | Freq: Every day | ORAL | 3 refills | Status: DC

## 2023-01-13 MED ORDER — FUROSEMIDE 80 MG PO TABS: 80.0000 mg | ORAL_TABLET | Freq: Every day | ORAL | 3 refills | Status: DC

## 2023-01-13 MED ORDER — APIXABAN 5 MG PO TABS: 5.0000 mg | ORAL_TABLET | Freq: Two times a day (BID) | ORAL | 3 refills | Status: AC

## 2023-01-13 MED ORDER — METFORMIN HCL 500 MG PO TABS: 1000.0000 mg | ORAL_TABLET | Freq: Two times a day (BID) | ORAL | 3 refills | Status: DC

## 2023-01-13 MED ORDER — TELMISARTAN 80 MG PO TABS: 80.0000 mg | ORAL_TABLET | Freq: Every day | ORAL | 3 refills | Status: DC

## 2023-01-13 MED ORDER — ATORVASTATIN CALCIUM 20 MG PO TABS: 20.0000 mg | ORAL_TABLET | Freq: Every day | ORAL | 3 refills | Status: DC

## 2023-01-13 MED ORDER — CLONIDINE HCL 0.2 MG PO TABS: 0.2000 mg | ORAL_TABLET | Freq: Two times a day (BID) | ORAL | 3 refills | Status: DC

## 2023-01-13 MED ORDER — ALLOPURINOL 100 MG PO TABS: 100.0000 mg | ORAL_TABLET | Freq: Every day | ORAL | 3 refills | Status: DC

## 2023-01-13 MED ORDER — CARVEDILOL 12.5 MG PO TABS: 12.5000 mg | ORAL_TABLET | Freq: Two times a day (BID) | ORAL | 3 refills | Status: DC

## 2023-01-13 MED ORDER — AMLODIPINE BESYLATE 10 MG PO TABS: 10.0000 mg | ORAL_TABLET | Freq: Every day | ORAL | 3 refills | Status: DC

## 2023-01-13 NOTE — Progress Notes (Unsigned)
Patient ID: Gerald Day, male   DOB: 12-31-1955, 67 y.o.   MRN: [id number]         Chief Complaint:: wellness exam and dm, hx colon polyp, low vit d, ckd3a, hld       HPI:  Gerald Day is a 67 y.o. male here for wellness exam, for eye exam in August 2024., pt will call for eye exam, due for colonoscopy, for shingrix at pharmacy, o'w up to date; for prevnar 20 today                        Also Pt denies chest pain, increased sob or doe, wheezing, orthopnea, PND, increased LE swelling, palpitations, dizziness or syncope.   Pt denies polydipsia, polyuria, or new focal neuro s/s.    Pt denies fever, wt loss, night sweats, loss of appetite, or other constitutional symptoms  Unable to lose wt  Wt Readings from Last 3 Encounters:  01/13/23 (!) 377 lb (171 kg)  07/15/22 (!) 378 lb (171.5 kg)  02/23/22 (!) 385 lb (174.6 kg)   BP Readings from Last 3 Encounters:  01/13/23 132/84  07/15/22 (!) 166/92  02/23/22 (!) 151/78   Immunization History  Administered Date(s) Administered   Fluad Quad(high Dose 65+) 07/04/2021   PFIZER(Purple Top)SARS-COV-2 Vaccination 02/26/2020, 03/27/2020   PNEUMOCOCCAL CONJUGATE-20 01/13/2023   Pneumococcal Polysaccharide-23 10/27/2013, 07/04/2021   Td 08/28/2008   Tdap 12/22/2018   Health Maintenance Due  Topic Date Due   OPHTHALMOLOGY EXAM  Never done   COLONOSCOPY (Pts 45-39yrs Insurance coverage will need to be confirmed)  04/02/2012   Medicare Annual Wellness (AWV)  02/24/2023      Past Medical History:  Diagnosis Date   Arthritis    IN KNEES   Cellulitis 10/26/2013   LOWER RT EXTREMITY   CELLULITIS/ABSCESS, LEG 06/27/2007   COLONIC POLYPS    CONGESTIVE HEART FAILURE    DIABETES MELLITUS, TYPE II    DIVERTICULOSIS, COLON    ERECTILE DYSFUNCTION, ORGANIC    GOUT NOS    Headache    HYPERLIPIDEMIA    HYPERTENSION    LOW BACK PAIN    Morbid obesity (HCC)    SLEEP APNEA, OBSTRUCTIVE    USES CPAP   Past Surgical History:  Procedure  Laterality Date   APPENDECTOMY     CARDIOVERSION N/A 08/02/2020   Procedure: CARDIOVERSION;  Surgeon: Thurmon Fair, MD;  Location: [facility] ENDOSCOPY;  Service: Cardiovascular;  Laterality: N/A;   FOOT SURGERY     LT   TEE WITHOUT CARDIOVERSION N/A 08/02/2020   Procedure: TRANSESOPHAGEAL ECHOCARDIOGRAM (TEE);  Surgeon: Thurmon Fair, MD;  Location: [facility] ENDOSCOPY;  Service: Cardiovascular;  Laterality: N/A;   TENOTOMY / FLEXOR TENDON TRANSFER Right 03/15/2015   Procedure: TENOTOMY HT REPAIR/ULCER DEBRIDEMENT/GRAFT PREP/ACELL GRAFT APPLICATION;  Surgeon: Sherin Quarry, DPM;  Location: [facility] OR;  Service: Podiatry;  Laterality: Right;  HALLUX    reports that he has never smoked. He has never used smokeless tobacco. He reports that he does not currently use alcohol after a past usage of about 3.0 - 4.0 standard drinks of alcohol per week. He reports that he does not use drugs. family history includes Asthma in an other family member; Cardiomyopathy in his father. Allergies  Allergen Reactions   Penicillins Other (See Comments)    Unknown childhood allergic reaction Has patient had a PCN reaction causing immediate rash, facial/tongue/throat swelling, SOB or lightheadedness with hypotension: Unknown Has patient had a PCN reaction  causing severe rash involving mucus membranes or skin necrosis: Unknown Has patient had a PCN reaction that required hospitalization: No Has patient had a PCN reaction occurring within the last 10 years: No If all of the above answers are "NO", then may proceed with Cephalosporin use.    Tizanidine Other (See Comments)    07/26/20: Pt does not recognize drug and does not remember being allergic to it.   Current Outpatient Medications on File Prior to Visit  Medication Sig Dispense Refill   Cholecalciferol (THERA-D 2000) 50 MCG (2000 UT) TABS 1 tab by mouth once daily 90 tablet 99   vitamin B-12 (CYANOCOBALAMIN) 1000 MCG tablet Take 1 tablet (1,000 mcg total) by mouth  daily. 90 tablet 3   No current facility-administered medications on file prior to visit.        ROS:  All others reviewed and negative.  Objective        PE:  BP 132/84 (BP Location: Right Arm, Patient Position: Sitting, Cuff Size: Normal)   Pulse (!) 42   Temp 98.3 F (36.8 C) (Oral)   Ht 6' (1.829 m)   Wt (!) 377 lb (171 kg)   SpO2 98%   BMI 51.13 kg/m                 Constitutional: Pt appears in NAD               HENT: Head: NCAT.                Right Ear: External ear normal.                 Left Ear: External ear normal.                Eyes: . Pupils are equal, round, and reactive to light. Conjunctivae and EOM are normal               Nose: without d/c or deformity               Neck: Neck supple. Gross normal ROM               Cardiovascular: Normal rate and regular rhythm.                 Pulmonary/Chest: Effort normal and breath sounds without rales or wheezing.                Abd:  Soft, NT, ND, + BS, no organomegaly               Neurological: Pt is alert. At baseline orientation, motor grossly intact               Skin: Skin is warm. No rashes, no other new lesions, LE edema - none               Psychiatric: Pt behavior is normal without agitation   Micro: none  Cardiac tracings I have personally interpreted today:  none  Pertinent Radiological findings (summarize): none   Lab Results  Component Value Date   WBC 7.5 01/06/2023   HGB 11.7 (L) 01/06/2023   HCT 34.6 (L) 01/06/2023   PLT 231.0 01/06/2023   GLUCOSE 139 (H) 01/06/2023   CHOL 153 01/06/2023   TRIG 124.0 01/06/2023   HDL 43.70 01/06/2023   LDLDIRECT 83.0 12/19/2018   LDLCALC 84 01/06/2023   ALT 15 01/06/2023   AST 13 01/06/2023   NA 141 01/06/2023   K  5.2 (H) 01/06/2023   CL 109 01/06/2023   CREATININE 2.17 (H) 01/06/2023   BUN 32 (H) 01/06/2023   CO2 25 01/06/2023   TSH 1.88 01/06/2023   PSA 0.83 01/06/2023   INR 1.4 (H) 07/26/2020   HGBA1C 6.8 (H) 01/06/2023   MICROALBUR 95.3 (H)  01/06/2023   Assessment/Plan:  Gerald Day is a 66 y.o. White or Caucasian [1] male with  has a past medical history of Arthritis, Cellulitis (10/26/2013), CELLULITIS/ABSCESS, LEG (06/27/2007), COLONIC POLYPS, CONGESTIVE HEART FAILURE, DIABETES MELLITUS, TYPE II, DIVERTICULOSIS, COLON, ERECTILE DYSFUNCTION, ORGANIC, GOUT NOS, Headache, HYPERLIPIDEMIA, HYPERTENSION, LOW BACK PAIN, Morbid obesity (HCC), and SLEEP APNEA, OBSTRUCTIVE.  Encounter for well adult exam with abnormal findings Age and sex appropriate education and counseling updated with regular exercise and diet Referrals for preventative services - pt will call for eye exam, for colonoscopy Immunizations addressed - for shingrix at pharmacy, for prevnar 20 today Smoking counseling  - none needed Evidence for depression or other mood disorder - none significant Most recent labs reviewed. I have personally reviewed and have noted: 1) the patient's medical and social history 2) The patient's current medications and supplements 3) The patient's height, weight, and BMI have been recorded in the chart   Vitamin D deficiency Last vitamin D Lab Results  Component Value Date   VD25OH 16.43 (L) 01/06/2023   Low, to increase oral replacement to 4000 u qd    Stage 3a chronic kidney disease (HCC) Lab Results  Component Value Date   CREATININE 2.17 (H) 01/06/2023   Stable overall, cont to avoid nephrotoxins, declines renal referral for now  Hyperlipidemia Lab Results  Component Value Date   LDLCALC 84 01/06/2023   Uncontrolled, for increased lipitor 20 mg qd   Essential hypertension BP Readings from Last 3 Encounters:  01/13/23 132/84  07/15/22 (!) 166/92  02/23/22 (!) 151/78   Stable, pt to continue medical treatment micardis 80 qd, catapress 0.2 bid, norvasc 10 qd, coreg 12.5 bid   Diabetes mellitus type 2 in obese Lab Results  Component Value Date   HGBA1C 6.8 (H) 01/06/2023   Uncontrolled, to add mounjaro  2.5 mg qweekly, cont metformin 100 bid,    B12 deficiency Lab Results  Component Value Date   VITAMINB12 925 (H) 01/06/2023   Stable, cont oral replacement - b12 1000 mcg qd  Followup: Return in about 6 months (around 07/16/2023).  Oliver Barre, MD 01/14/2023 9:18 PM [facility] [place]

## 2023-01-13 NOTE — Patient Instructions (Addendum)
Please have your Shingrix (shingles) shots done at your local pharmacy.  You had the Prevnar 20 pneumonia shot today  Ok to CHANGE the trulicity to the mounjaro 2.5 mg for better sugar AND wt loss - and call in 1 month if taking this ok for an increased dose  Please continue all other medications as before, and refills have been done as requested.  Ok to increase the Vit D3 to 4000 units per day  Please have the pharmacy call with any other refills you may need.  Please continue your efforts at being more active, low cholesterol diet, and weight control.  You are otherwise up to date with prevention measures today.  Please keep your appointments with your specialists as you may have planned  You will be contacted regarding the referral for: colonoscopy  Please make an Appointment to return in 6 months, or sooner if needed, also with Lab Appointment for testing done 3-5 days before at the FIRST FLOOR Lab (so this is for TWO appointments - please see the scheduling desk as you leave)

## 2023-01-14 ENCOUNTER — Encounter: Payer: Self-pay | Admitting: Internal Medicine

## 2023-01-14 NOTE — Assessment & Plan Note (Signed)
Lab Results  Component Value Date   LDLCALC 84 01/06/2023   Uncontrolled, for increased lipitor 20 mg qd

## 2023-01-14 NOTE — Assessment & Plan Note (Signed)
Lab Results  Component Value Date   VITAMINB12 925 (H) 01/06/2023   Stable, cont oral replacement - b12 1000 mcg qd

## 2023-01-14 NOTE — Assessment & Plan Note (Addendum)
Age and sex appropriate education and counseling updated with regular exercise and diet Referrals for preventative services - pt will call for eye exam, for colonoscopy Immunizations addressed - for shingrix at pharmacy, for prevnar 20 today Smoking counseling  - none needed Evidence for depression or other mood disorder - none significant Most recent labs reviewed. I have personally reviewed and have noted: 1) the patient's medical and social history 2) The patient's current medications and supplements 3) The patient's height, weight, and BMI have been recorded in the chart

## 2023-01-14 NOTE — Assessment & Plan Note (Addendum)
Lab Results  Component Value Date   CREATININE 2.17 (H) 01/06/2023   Stable overall, cont to avoid nephrotoxins, declines renal referral for now

## 2023-01-14 NOTE — Assessment & Plan Note (Signed)
Lab Results  Component Value Date   HGBA1C 6.8 (H) 01/06/2023   Uncontrolled, to add mounjaro 2.5 mg qweekly, cont metformin 100 bid,

## 2023-01-14 NOTE — Assessment & Plan Note (Addendum)
Last vitamin D Lab Results  Component Value Date   VD25OH 16.43 (L) 01/06/2023   Low, to increase oral replacement to 4000 u qd

## 2023-01-14 NOTE — Assessment & Plan Note (Signed)
BP Readings from Last 3 Encounters:  01/13/23 132/84  07/15/22 (!) 166/92  02/23/22 (!) 151/78   Stable, pt to continue medical treatment micardis 80 qd, catapress 0.2 bid, norvasc 10 qd, coreg 12.5 bid

## 2023-02-26 ENCOUNTER — Ambulatory Visit (INDEPENDENT_AMBULATORY_CARE_PROVIDER_SITE_OTHER): Payer: Medicare HMO | Admitting: *Deleted

## 2023-02-26 VITALS — Ht 73.0 in | Wt 375.0 lb

## 2023-02-26 DIAGNOSIS — Z0000 Encounter for general adult medical examination without abnormal findings: Secondary | ICD-10-CM

## 2023-02-26 DIAGNOSIS — Z1211 Encounter for screening for malignant neoplasm of colon: Secondary | ICD-10-CM | POA: Diagnosis not present

## 2023-02-26 NOTE — Progress Notes (Signed)
Subjective:   [name] is a 67 y.o. male who presents for Medicare Annual/Subsequent preventive examination.  Visit Complete: Virtual  I connected with  Gerald Day on 02/26/23 by a audio enabled telemedicine application and verified that I am speaking with the correct person using two identifiers.  Patient Location: Home  Provider Location: Office/Clinic  I discussed the limitations of evaluation and management by telemedicine. The patient expressed understanding and agreed to proceed.  Patient Medicare AWV questionnaire was not completed by the patient.  Review of Systems    Defer to PCP  Cardiac Risk Factors include: advanced age (>73men, >42 women);diabetes mellitus;obesity (BMI >30kg/m2);male gender     Objective:    Today's Vitals   02/26/23 1317  Weight: (!) 375 lb (170.1 kg)  Height: 6\' 1"  (1.854 m)   Body mass index is 49.48 kg/m.     02/26/2023    1:23 PM 02/23/2022    1:17 PM 09/01/2020    4:12 PM 07/26/2020    8:53 PM 02/25/2018    6:11 AM 11/01/2017    6:31 AM 06/22/2017   11:51 AM  Advanced Directives  Does Patient Have a Medical Advance Directive? No No No Yes No No No  Type of Advance Directive    Healthcare Power of Attorney     Does patient want to make changes to medical advance directive?    No - Patient declined     Copy of Healthcare Power of Attorney in Chart?    No - copy requested     Would patient like information on creating a medical advance directive? No - Patient declined No - Patient declined No - Patient declined  No - Patient declined No - Patient declined No - Patient declined    Current Medications (verified) Outpatient Encounter Medications as of 02/26/2023  Medication Sig   allopurinol (ZYLOPRIM) 100 MG tablet Take 1 tablet (100 mg total) by mouth daily.   amLODipine (NORVASC) 10 MG tablet Take 1 tablet (10 mg total) by mouth daily.   apixaban (ELIQUIS) 5 MG TABS tablet Take 1 tablet (5 mg total) by mouth 2 (two)  times daily.   atorvastatin (LIPITOR) 20 MG tablet Take 1 tablet (20 mg total) by mouth daily.   carvedilol (COREG) 12.5 MG tablet Take 1 tablet (12.5 mg total) by mouth 2 (two) times daily with a meal.   Cholecalciferol (THERA-D 2000) 50 MCG (2000 UT) TABS 1 tab by mouth once daily   cloNIDine (CATAPRES) 0.2 MG tablet Take 1 tablet (0.2 mg total) by mouth 2 (two) times daily.   diltiazem (CARDIZEM CD) 180 MG 24 hr capsule Take 1 capsule (180 mg total) by mouth daily.   furosemide (LASIX) 80 MG tablet Take 1 tablet (80 mg total) by mouth daily.   metFORMIN (GLUCOPHAGE) 500 MG tablet Take 2 tablets (1,000 mg total) by mouth 2 (two) times daily with a meal.   potassium chloride SA (KLOR-CON M) 20 MEQ tablet Take 1 tablet (20 mEq total) by mouth daily.   telmisartan (MICARDIS) 80 MG tablet Take 1 tablet (80 mg total) by mouth daily.   tirzepatide [facility]) 2.5 MG/0.5ML Pen Inject 2.5 mg into the skin once a week.   vitamin B-12 (CYANOCOBALAMIN) 1000 MCG tablet Take 1 tablet (1,000 mcg total) by mouth daily.   No facility-administered encounter medications on file as of 02/26/2023.    Allergies (verified) Penicillins and Tizanidine   History: Past Medical History:  Diagnosis Date   Arthritis  IN KNEES   Cellulitis 10/26/2013   LOWER RT EXTREMITY   CELLULITIS/ABSCESS, LEG 06/27/2007   COLONIC POLYPS    CONGESTIVE HEART FAILURE    DIABETES MELLITUS, TYPE II    DIVERTICULOSIS, COLON    ERECTILE DYSFUNCTION, ORGANIC    GOUT NOS    Headache    HYPERLIPIDEMIA    HYPERTENSION    LOW BACK PAIN    Morbid obesity (HCC)    SLEEP APNEA, OBSTRUCTIVE    USES CPAP   Past Surgical History:  Procedure Laterality Date   APPENDECTOMY     CARDIOVERSION N/A 08/02/2020   Procedure: CARDIOVERSION;  Surgeon: Thurmon Fair, MD;  Location: [facility] ENDOSCOPY;  Service: Cardiovascular;  Laterality: N/A;   FOOT SURGERY     LT   TEE WITHOUT CARDIOVERSION N/A 08/02/2020   Procedure: TRANSESOPHAGEAL  ECHOCARDIOGRAM (TEE);  Surgeon: Thurmon Fair, MD;  Location[facility] ENDOSCOPY;  Service: Cardiovascular;  Laterality: N/A;   TENOTOMY / FLEXOR TENDON TRANSFER Right 03/15/2015   Procedure: TENOTOMY HT REPAIR/ULCER DEBRIDEMENT/GRAFT PREP/ACELL GRAFT APPLICATION;  Surgeon: Sherin Quarry, DPM;  Location: [facility] OR;  Service: Podiatry;  Laterality: Right;  HALLUX   Family History  Problem Relation Age of Onset   Cardiomyopathy Father    Asthma Other    Social History   Socioeconomic History   Marital status: Married    Spouse name: Not on file   Number of children: Not on file   Years of education: Not on file   Highest education level: Not on file  Occupational History   Occupation: retired  Tobacco Use   Smoking status: Never   Smokeless tobacco: Never  Vaping Use   Vaping Use: Never used  Substance and Sexual Activity   Alcohol use: Not Currently    Alcohol/week: 3.0 - 4.0 standard drinks of alcohol    Types: 3 - 4 Cans of beer per week    Comment: hardly any; h/o heavy use   Drug use: No   Sexual activity: Not on file  Other Topics Concern   Not on file  Social History Narrative   Not on file   Social Determinants of Health   Financial Resource Strain: Low Risk  (02/26/2023)   Overall Financial Resource Strain (CARDIA)    Difficulty of Paying Living Expenses: Not hard at all  Food Insecurity: No Food Insecurity (02/26/2023)   Hunger Vital Sign    Worried About Running Out of Food in the Last Year: Never true    Ran Out of Food in the Last Year: Never true  Transportation Needs: No Transportation Needs (02/26/2023)   PRAPARE - Administrator, Civil Service (Medical): No    Lack of Transportation (Non-Medical): No  Physical Activity: Sufficiently Active (02/26/2023)   Exercise Vital Sign    Days of Exercise per Week: 7 days    Minutes of Exercise per Session: 30 min  Stress: No Stress Concern Present (02/26/2023)   [employer] of Occupational Health -  Occupational Stress Questionnaire    Feeling of Stress : Not at all  Social Connections: Socially Isolated (02/26/2023)   Social Connection and Isolation Panel [NHANES]    Frequency of Communication with Friends and Family: More than three times a week    Frequency of Social Gatherings with Friends and Family: More than three times a week    Attends Religious Services: Never    Database administrator or Organizations: No    Attends Banker Meetings: Never  Marital Status: Widowed    Tobacco Counseling Counseling given: Not Answered   Clinical Intake:  Pre-visit preparation completed: Yes  Pain : No/denies pain     Nutritional Status: BMI > 30  Obese Nutritional Risks: None Diabetes: Yes CBG done?: No Did pt. bring in CBG monitor from home?: No  How often do you need to have someone help you when you read instructions, pamphlets, or other written materials from your doctor or pharmacy?: 1 - Never What is the last grade level you completed in school?: 12th grade  Interpreter Needed?: No      Activities of Daily Living    02/26/2023    1:23 PM  In your present state of health, do you have any difficulty performing the following activities:  Hearing? 0  Vision? 0  Difficulty concentrating or making decisions? 0  Walking or climbing stairs? 1  Comment stairs a little bit difficult  Dressing or bathing? 0  Doing errands, shopping? 0  Preparing Food and eating ? N  Using the Toilet? N  In the past six months, have you accidently leaked urine? N  Do you have problems with loss of bowel control? N  Managing your Medications? N  Managing your Finances? N  Housekeeping or managing your Housekeeping? N    Patient Care Team: Corwin Levins, MD as PCP - Reatha Armour, MD as PCP - Cardiology (Cardiology) Szabat, Vinnie Level, [facility] (Inactive) as Pharmacist (Pharmacist)  Indicate any recent Medical Services you may have received from other than [facility]  providers in the past year (date may be approximate).     Assessment:   This is a routine wellness examination for Gerald Day.  Hearing/Vision screen Vision Screening - Comments:: Annual eye exam, patient is over due  Dietary issues and exercise activities discussed:     Goals Addressed               This Visit's Progress     Patient Stated (pt-stated)        Patient states he wants to continue to lose weight      Depression Screen    02/26/2023    1:22 PM 02/26/2023    1:19 PM 01/13/2023   10:42 AM 07/15/2022    1:46 PM 02/23/2022    1:14 PM 01/02/2022    9:10 AM 07/04/2021    8:26 AM  PHQ 2/9 Scores  PHQ - 2 Score 0 0 0 0 0 0 0  PHQ- 9 Score    2 0 2     Fall Risk    02/26/2023    1:19 PM 01/13/2023   10:42 AM 07/15/2022    1:46 PM 02/23/2022    1:17 PM 01/02/2022    9:10 AM  Fall Risk   Falls in the past year? 0 0 0 0 0  Number falls in past yr: 0 0  0 0  Injury with Fall? 0 0  0 0  Risk for fall due to : No Fall Risks No Fall Risks No Fall Risks No Fall Risks   Follow up Falls evaluation completed Falls evaluation completed Falls evaluation completed      MEDICARE RISK AT HOME:  Medicare Risk at Home - 02/26/23 1325     Any stairs in or around the home? Yes    If so, are there any without handrails? Yes    Home free of loose throw rugs in walkways, pet beds, electrical cords, etc? Yes    Adequate lighting  in your home to reduce risk of falls? Yes    Life alert? No    Use of a cane, walker or w/c? Yes   patient uses a walking stick   Grab bars in the bathroom? Yes    Shower chair or bench in shower? No    Elevated toilet seat or a handicapped toilet? Yes             TIMED UP AND GO:  Was the test performed?  No    Cognitive Function:        02/26/2023    1:30 PM 02/23/2022    1:17 PM  6CIT Screen  What Year? 0 points 0 points  What month? 0 points 0 points  What time? 0 points 0 points  Count back from 20 0 points 0 points  Months in reverse  0 points 0 points  Repeat phrase 0 points 0 points  Total Score 0 points 0 points    Immunizations Immunization History  Administered Date(s) Administered   Fluad Quad(high Dose 65+) 07/04/2021   PFIZER(Purple Top)SARS-COV-2 Vaccination 02/26/2020, 03/27/2020   PNEUMOCOCCAL CONJUGATE-20 01/13/2023   Pneumococcal Polysaccharide-23 10/27/2013, 07/04/2021   Td 08/28/2008   Tdap 12/22/2018    TDAP status: Up to date  Flu Vaccine status: Up to date  Pneumococcal vaccine status: Up to date  Covid-19 vaccine status: Completed vaccines  Qualifies for Shingles Vaccine? Yes   Zostavax completed No   Shingrix Completed?: No.    Education has been provided regarding the importance of this vaccine. Patient has been advised to call insurance company to determine out of pocket expense if they have not yet received this vaccine. Advised may also receive vaccine at local pharmacy or Health Dept. Verbalized acceptance and understanding.  Screening Tests Health Maintenance  Topic Date Due   OPHTHALMOLOGY EXAM  Never done   Colonoscopy  04/02/2012   Zoster Vaccines- Shingrix (1 of 2) 04/15/2023 (Originally 12/16/1974)   HEMOGLOBIN A1C  07/09/2023   Diabetic kidney evaluation - eGFR measurement  01/06/2024   Diabetic kidney evaluation - Urine ACR  01/06/2024   FOOT EXAM  01/13/2024   Medicare Annual Wellness (AWV)  02/26/2024   DTaP/Tdap/Td (3 - Td or Tdap) 12/21/2028   Pneumonia Vaccine 18+ Years old  Completed   Hepatitis C Screening  Completed   HPV VACCINES  Aged Out   INFLUENZA VACCINE  Discontinued   COVID-19 Vaccine  Discontinued    Health Maintenance  Health Maintenance Due  Topic Date Due   OPHTHALMOLOGY EXAM  Never done   Colonoscopy  04/02/2012    Colorectal cancer screening: Referral to GI placed 06//21/2024. Pt aware the office will call re: appt.  Lung Cancer Screening: (Low Dose CT Chest recommended if Age 76-80 years, 20 pack-year currently smoking OR have quit w/in  15years.) does not qualify.   Lung Cancer Screening Referral: n/a  Additional Screening:  Hepatitis C Screening: does qualify; Completed 06/17/2018  Vision Screening: Recommended annual ophthalmology exams for early detection of glaucoma and other disorders of the eye. Is the patient up to date with their annual eye exam?  No  Who is the provider or what is the name of the office in which the patient attends annual eye exams? Patient is currently looking for an eye doctor If pt is not established with a provider, would they like to be referred to a provider to establish care? No .   Dental Screening: Recommended annual dental exams for proper oral hygiene  Diabetic Foot Exam: Diabetic Foot Exam: Completed 01/13/2023  Community Resource Referral / Chronic Care Management: CRR required this visit?  No   CCM required this visit?  No     Plan:     I have personally reviewed and noted the following in the patient's chart:   Medical and social history Use of alcohol, tobacco or illicit drugs  Current medications and supplements including opioid prescriptions. Patient is not currently taking opioid prescriptions. Functional ability and status Nutritional status Physical activity Advanced directives List of other physicians Hospitalizations, surgeries, and ER visits in previous 12 months Vitals Screenings to include cognitive, depression, and falls Referrals and appointments  In addition, I have reviewed and discussed with patient certain preventive protocols, quality metrics, and best practice recommendations. A written personalized care plan for preventive services as well as general preventive health recommendations were provided to patient.     Tamela Oddi, CMA   02/26/2023   After Visit Summary: (Mail) Due to this being a telephonic visit, the after visit summary with patients personalized plan was offered to patient via mail   Nurse Notes:  Mr. Achterberg , Thank  you for taking time to come for your Medicare Wellness Visit. I appreciate your ongoing commitment to your health goals. Please review the following plan we discussed and let me know if I can assist you in the future.   These are the goals we discussed:  Goals       <enter goal here> (pt-stated)      Stay healthy.      Patient Stated (pt-stated)      Patient states he wants to continue to lose weight      Track and Manage My Blood Pressure-Hypertension      Timeframe:  Long-Range Goal Priority:  High Start Date:   05/06/2021                          Expected End Date:  11/06/2021                     Follow Up Date June 2023   - check blood pressure daily - choose a place to take my blood pressure (home, clinic or office, retail store) - write blood pressure results in a log or diary    Why is this important?   You won't feel high blood pressure, but it can still hurt your blood vessels.  High blood pressure can cause heart or kidney problems. It can also cause a stroke.  Making lifestyle changes like losing a little weight or eating less salt will help.  Checking your blood pressure at home and at different times of the day can help to control blood pressure.  If the doctor prescribes medicine remember to take it the way the doctor ordered.  Call the office if you cannot afford the medicine or if there are questions about it.           This is a list of the screening recommended for you and due dates:  Health Maintenance  Topic Date Due   Eye exam for diabetics  Never done   Colon Cancer Screening  04/02/2012   Zoster (Shingles) Vaccine (1 of 2) 04/15/2023*   Hemoglobin A1C  07/09/2023   Yearly kidney function blood test for diabetes  01/06/2024   Yearly kidney health urinalysis for diabetes  01/06/2024   Complete foot exam   01/13/2024   Medicare  Annual Wellness Visit  02/26/2024   DTaP/Tdap/Td vaccine (3 - Td or Tdap) 12/21/2028   Pneumonia Vaccine  Completed    Hepatitis C Screening  Completed   HPV Vaccine  Aged Out   Flu Shot  Discontinued   COVID-19 Vaccine  Discontinued  *Topic was postponed. The date shown is not the original due date.

## 2023-02-26 NOTE — Patient Instructions (Addendum)
Gerald Day , Thank you for taking time to come for your Medicare Wellness Visit. I appreciate your ongoing commitment to your health goals. Please review the following plan we discussed and let me know if I can assist you in the future.   These are the goals we discussed:  Goals       <enter goal here> (pt-stated)      Stay healthy.      Patient Stated (pt-stated)      Patient states he wants to continue to lose weight      Track and Manage My Blood Pressure-Hypertension      Timeframe:  Long-Range Goal Priority:  High Start Date:   05/06/2021                          Expected End Date:  11/06/2021                     Follow Up Date June 2023   - check blood pressure daily - choose a place to take my blood pressure (home, clinic or office, retail store) - write blood pressure results in a log or diary    Why is this important?   You won't feel high blood pressure, but it can still hurt your blood vessels.  High blood pressure can cause heart or kidney problems. It can also cause a stroke.  Making lifestyle changes like losing a little weight or eating less salt will help.  Checking your blood pressure at home and at different times of the day can help to control blood pressure.  If the doctor prescribes medicine remember to take it the way the doctor ordered.  Call the office if you cannot afford the medicine or if there are questions about it.           This is a list of the screening recommended for you and due dates:  Health Maintenance  Topic Date Due   Eye exam for diabetics  Never done   Colon Cancer Screening  04/02/2012   Zoster (Shingles) Vaccine (1 of 2) 04/15/2023*   Hemoglobin A1C  07/09/2023   Yearly kidney function blood test for diabetes  01/06/2024   Yearly kidney health urinalysis for diabetes  01/06/2024   Complete foot exam   01/13/2024   Medicare Annual Wellness Visit  02/26/2024   DTaP/Tdap/Td vaccine (3 - Td or Tdap) 12/21/2028   Pneumonia Vaccine   Completed   Hepatitis C Screening  Completed   HPV Vaccine  Aged Out   Flu Shot  Discontinued   COVID-19 Vaccine  Discontinued  *Topic was postponed. The date shown is not the original due date.     Health Maintenance, Male Adopting a healthy lifestyle and getting preventive care are important in promoting health and wellness. Ask your health care provider about: The right schedule for you to have regular tests and exams. Things you can do on your own to prevent diseases and keep yourself healthy. What should I know about diet, weight, and exercise? Eat a healthy diet  Eat a diet that includes plenty of vegetables, fruits, low-fat dairy products, and lean protein. Do not eat a lot of foods that are high in solid fats, added sugars, or sodium. Maintain a healthy weight Body mass index (BMI) is a measurement that can be used to identify possible weight problems. It estimates body fat based on height and weight. Your health care provider can  help determine your BMI and help you achieve or maintain a healthy weight. Get regular exercise Get regular exercise. This is one of the most important things you can do for your health. Most adults should: Exercise for at least 150 minutes each week. The exercise should increase your heart rate and make you sweat (moderate-intensity exercise). Do strengthening exercises at least twice a week. This is in addition to the moderate-intensity exercise. Spend less time sitting. Even light physical activity can be beneficial. Watch cholesterol and blood lipids Have your blood tested for lipids and cholesterol at 68 years of age, then have this test every 5 years. You may need to have your cholesterol levels checked more often if: Your lipid or cholesterol levels are high. You are older than 68 years of age. You are at high risk for heart disease. What should I know about cancer screening? Many types of cancers can be detected early and may often be  prevented. Depending on your health history and family history, you may need to have cancer screening at various ages. This may include screening for: Colorectal cancer. Prostate cancer. Skin cancer. Lung cancer. What should I know about heart disease, diabetes, and high blood pressure? Blood pressure and heart disease High blood pressure causes heart disease and increases the risk of stroke. This is more likely to develop in people who have high blood pressure readings or are overweight. Talk with your health care provider about your target blood pressure readings. Have your blood pressure checked: Every 3-5 years if you are 68-6 years of age. Every year if you are 9 years old or older. If you are between the ages of 28 and 34 and are a current or former smoker, ask your health care provider if you should have a one-time screening for abdominal aortic aneurysm (AAA). Diabetes Have regular diabetes screenings. This checks your fasting blood sugar level. Have the screening done: Once every three years after age 58 if you are at a normal weight and have a low risk for diabetes. More often and at a younger age if you are overweight or have a high risk for diabetes. What should I know about preventing infection? Hepatitis B If you have a higher risk for hepatitis B, you should be screened for this virus. Talk with your health care provider to find out if you are at risk for hepatitis B infection. Hepatitis C Blood testing is recommended for: Everyone born from [birth year] through 1966. Anyone with known risk factors for hepatitis C. Sexually transmitted infections (STIs) You should be screened each year for STIs, including gonorrhea and chlamydia, if: You are sexually active and are younger than 68 years of age. You are older than 68 years of age and your health care provider tells you that you are at risk for this type of infection. Your sexual activity has changed since you were last screened,  and you are at increased risk for chlamydia or gonorrhea. Ask your health care provider if you are at risk. Ask your health care provider about whether you are at high risk for HIV. Your health care provider may recommend a prescription medicine to help prevent HIV infection. If you choose to take medicine to prevent HIV, you should first get tested for HIV. You should then be tested every 3 months for as long as you are taking the medicine. Follow these instructions at home: Alcohol use Do not drink alcohol if your health care provider tells you not to drink. If  you drink alcohol: Limit how much you have to 0-2 drinks a day. Know how much alcohol is in your drink. In the [place] one drink equals one 12 oz bottle of beer (355 mL), one 5 oz glass of wine (148 mL), or one 1 oz glass of hard liquor (44 mL). Lifestyle Do not use any products that contain nicotine or tobacco. These products include cigarettes, chewing tobacco, and vaping devices, such as e-cigarettes. If you need help quitting, ask your health care provider. Do not use street drugs. Do not share needles. Ask your health care provider for help if you need support or information about quitting drugs. General instructions Schedule regular health, dental, and eye exams. Stay current with your vaccines. Tell your health care provider if: You often feel depressed. You have ever been abused or do not feel safe at home. Summary Adopting a healthy lifestyle and getting preventive care are important in promoting health and wellness. Follow your health care provider's instructions about healthy diet, exercising, and getting tested or screened for diseases. Follow your health care provider's instructions on monitoring your cholesterol and blood pressure. This information is not intended to replace advice given to you by your health care provider. Make sure you discuss any questions you have with your health care provider. Document Revised:  01/13/2021 Document Reviewed: 01/13/2021 Elsevier Patient Education  2024 [employer].

## 2023-03-24 LAB — HM DIABETES EYE EXAM

## 2023-03-30 ENCOUNTER — Encounter: Payer: Self-pay | Admitting: Internal Medicine

## 2023-05-09 ENCOUNTER — Inpatient Hospital Stay (HOSPITAL_COMMUNITY)
Admission: EM | Admit: 2023-05-09 | Discharge: 2023-05-14 | DRG: 291 | Disposition: A | Discharge status: Home / Self Care | Payer: Medicare HMO | Attending: Internal Medicine | Admitting: Internal Medicine

## 2023-05-09 ENCOUNTER — Inpatient Hospital Stay (HOSPITAL_COMMUNITY): Payer: Medicare HMO

## 2023-05-09 ENCOUNTER — Encounter (HOSPITAL_COMMUNITY): Payer: Self-pay

## 2023-05-09 ENCOUNTER — Emergency Department (HOSPITAL_COMMUNITY): Payer: Medicare HMO

## 2023-05-09 DIAGNOSIS — E875 Hyperkalemia: Secondary | ICD-10-CM | POA: Diagnosis not present

## 2023-05-09 DIAGNOSIS — N179 Acute kidney failure, unspecified: Secondary | ICD-10-CM | POA: Diagnosis present

## 2023-05-09 DIAGNOSIS — I10 Essential (primary) hypertension: Secondary | ICD-10-CM | POA: Diagnosis not present

## 2023-05-09 DIAGNOSIS — Z8249 Family history of ischemic heart disease and other diseases of the circulatory system: Secondary | ICD-10-CM | POA: Diagnosis not present

## 2023-05-09 DIAGNOSIS — I5033 Acute on chronic diastolic (congestive) heart failure: Secondary | ICD-10-CM | POA: Diagnosis not present

## 2023-05-09 DIAGNOSIS — D539 Nutritional anemia, unspecified: Secondary | ICD-10-CM | POA: Diagnosis present

## 2023-05-09 DIAGNOSIS — Z1152 Encounter for screening for COVID-19: Secondary | ICD-10-CM | POA: Diagnosis not present

## 2023-05-09 DIAGNOSIS — Z79899 Other long term (current) drug therapy: Secondary | ICD-10-CM

## 2023-05-09 DIAGNOSIS — N2581 Secondary hyperparathyroidism of renal origin: Secondary | ICD-10-CM | POA: Diagnosis present

## 2023-05-09 DIAGNOSIS — R001 Bradycardia, unspecified: Secondary | ICD-10-CM | POA: Insufficient documentation

## 2023-05-09 DIAGNOSIS — E669 Obesity, unspecified: Secondary | ICD-10-CM | POA: Diagnosis not present

## 2023-05-09 DIAGNOSIS — R079 Chest pain, unspecified: Secondary | ICD-10-CM | POA: Diagnosis not present

## 2023-05-09 DIAGNOSIS — Z6841 Body Mass Index (BMI) 40.0 and over, adult: Secondary | ICD-10-CM | POA: Diagnosis not present

## 2023-05-09 DIAGNOSIS — E872 Acidosis, unspecified: Secondary | ICD-10-CM | POA: Diagnosis present

## 2023-05-09 DIAGNOSIS — R0600 Dyspnea, unspecified: Secondary | ICD-10-CM | POA: Diagnosis not present

## 2023-05-09 DIAGNOSIS — J810 Acute pulmonary edema: Principal | ICD-10-CM

## 2023-05-09 DIAGNOSIS — I878 Other specified disorders of veins: Secondary | ICD-10-CM | POA: Diagnosis present

## 2023-05-09 DIAGNOSIS — N1832 Chronic kidney disease, stage 3b: Secondary | ICD-10-CM | POA: Diagnosis present

## 2023-05-09 DIAGNOSIS — Z7985 Long-term (current) use of injectable non-insulin antidiabetic drugs: Secondary | ICD-10-CM | POA: Diagnosis not present

## 2023-05-09 DIAGNOSIS — E66813 Obesity, class 3: Secondary | ICD-10-CM | POA: Diagnosis present

## 2023-05-09 DIAGNOSIS — D631 Anemia in chronic kidney disease: Secondary | ICD-10-CM | POA: Diagnosis present

## 2023-05-09 DIAGNOSIS — Z7984 Long term (current) use of oral hypoglycemic drugs: Secondary | ICD-10-CM

## 2023-05-09 DIAGNOSIS — I130 Hypertensive heart and chronic kidney disease with heart failure and stage 1 through stage 4 chronic kidney disease, or unspecified chronic kidney disease: Secondary | ICD-10-CM | POA: Diagnosis not present

## 2023-05-09 DIAGNOSIS — E1169 Type 2 diabetes mellitus with other specified complication: Secondary | ICD-10-CM | POA: Diagnosis not present

## 2023-05-09 DIAGNOSIS — I5031 Acute diastolic (congestive) heart failure: Secondary | ICD-10-CM | POA: Diagnosis not present

## 2023-05-09 DIAGNOSIS — I4891 Unspecified atrial fibrillation: Secondary | ICD-10-CM | POA: Diagnosis not present

## 2023-05-09 DIAGNOSIS — I480 Paroxysmal atrial fibrillation: Secondary | ICD-10-CM | POA: Diagnosis not present

## 2023-05-09 DIAGNOSIS — Z825 Family history of asthma and other chronic lower respiratory diseases: Secondary | ICD-10-CM

## 2023-05-09 DIAGNOSIS — D72829 Elevated white blood cell count, unspecified: Secondary | ICD-10-CM | POA: Diagnosis present

## 2023-05-09 DIAGNOSIS — E785 Hyperlipidemia, unspecified: Secondary | ICD-10-CM | POA: Diagnosis present

## 2023-05-09 DIAGNOSIS — Z7901 Long term (current) use of anticoagulants: Secondary | ICD-10-CM

## 2023-05-09 DIAGNOSIS — N189 Chronic kidney disease, unspecified: Secondary | ICD-10-CM | POA: Diagnosis not present

## 2023-05-09 DIAGNOSIS — R918 Other nonspecific abnormal finding of lung field: Secondary | ICD-10-CM | POA: Diagnosis not present

## 2023-05-09 DIAGNOSIS — R531 Weakness: Secondary | ICD-10-CM | POA: Diagnosis not present

## 2023-05-09 DIAGNOSIS — G4733 Obstructive sleep apnea (adult) (pediatric): Secondary | ICD-10-CM | POA: Diagnosis not present

## 2023-05-09 DIAGNOSIS — E1122 Type 2 diabetes mellitus with diabetic chronic kidney disease: Secondary | ICD-10-CM | POA: Diagnosis not present

## 2023-05-09 DIAGNOSIS — J811 Chronic pulmonary edema: Secondary | ICD-10-CM | POA: Diagnosis not present

## 2023-05-09 DIAGNOSIS — D649 Anemia, unspecified: Secondary | ICD-10-CM | POA: Diagnosis not present

## 2023-05-09 DIAGNOSIS — N184 Chronic kidney disease, stage 4 (severe): Secondary | ICD-10-CM | POA: Diagnosis not present

## 2023-05-09 DIAGNOSIS — I129 Hypertensive chronic kidney disease with stage 1 through stage 4 chronic kidney disease, or unspecified chronic kidney disease: Secondary | ICD-10-CM | POA: Diagnosis not present

## 2023-05-09 DIAGNOSIS — R42 Dizziness and giddiness: Secondary | ICD-10-CM | POA: Diagnosis not present

## 2023-05-09 DIAGNOSIS — E8779 Other fluid overload: Secondary | ICD-10-CM | POA: Diagnosis not present

## 2023-05-09 DIAGNOSIS — R0602 Shortness of breath: Secondary | ICD-10-CM | POA: Diagnosis not present

## 2023-05-09 LAB — BASIC METABOLIC PANEL
Anion gap: 11 (ref 5–15)
BUN: 78 mg/dL — ABNORMAL HIGH (ref 8–23)
CO2: 11 mmol/L — ABNORMAL LOW (ref 22–32)
Calcium: 8.5 mg/dL — ABNORMAL LOW (ref 8.9–10.3)
Chloride: 113 mmol/L — ABNORMAL HIGH (ref 98–111)
Creatinine, Ser: 6.11 mg/dL — ABNORMAL HIGH (ref 0.61–1.24)
GFR, Estimated: 9 mL/min — ABNORMAL LOW (ref >60)
Glucose, Bld: 151 mg/dL — ABNORMAL HIGH (ref 70–99)
Potassium: 7 mmol/L (ref 3.5–5.1)
Sodium: 135 mmol/L (ref 135–145)

## 2023-05-09 LAB — URINALYSIS, ROUTINE W REFLEX MICROSCOPIC
Bilirubin Urine: NEGATIVE
Glucose, UA: NEGATIVE mg/dL
Ketones, ur: NEGATIVE mg/dL
Leukocytes,Ua: NEGATIVE
Nitrite: NEGATIVE
Protein, ur: 30 mg/dL — AB
Specific Gravity, Urine: 1.006 (ref 1.005–1.030)
pH: 5 (ref 5.0–8.0)

## 2023-05-09 LAB — CBC WITH DIFFERENTIAL/PLATELET
Abs Immature Granulocytes: 0.1 10*3/uL — ABNORMAL HIGH (ref 0.00–0.07)
Basophils Absolute: 0.1 10*3/uL (ref 0.0–0.1)
Basophils Relative: 1 %
Eosinophils Absolute: 0.2 10*3/uL (ref 0.0–0.5)
Eosinophils Relative: 2 %
HCT: 32.4 % — ABNORMAL LOW (ref 39.0–52.0)
Hemoglobin: 10 g/dL — ABNORMAL LOW (ref 13.0–17.0)
Immature Granulocytes: 1 %
Lymphocytes Relative: 9 %
Lymphs Abs: 1 10*3/uL (ref 0.7–4.0)
MCH: 31.5 pg (ref 26.0–34.0)
MCHC: 30.9 g/dL (ref 30.0–36.0)
MCV: 102.2 fL — ABNORMAL HIGH (ref 80.0–100.0)
Monocytes Absolute: 0.9 10*3/uL (ref 0.1–1.0)
Monocytes Relative: 8 %
Neutro Abs: 8.6 10*3/uL — ABNORMAL HIGH (ref 1.7–7.7)
Neutrophils Relative %: 79 %
Platelets: 204 10*3/uL (ref 150–400)
RBC: 3.17 MIL/uL — ABNORMAL LOW (ref 4.22–5.81)
RDW: 15.6 % — ABNORMAL HIGH (ref 11.5–15.5)
WBC: 10.9 10*3/uL — ABNORMAL HIGH (ref 4.0–10.5)
nRBC: 0 % (ref 0.0–0.2)

## 2023-05-09 LAB — FERRITIN: Ferritin: 59 ng/mL (ref 24–336)

## 2023-05-09 LAB — SARS CORONAVIRUS 2 BY RT PCR: SARS Coronavirus 2 by RT PCR: NEGATIVE

## 2023-05-09 LAB — IRON AND TIBC
Iron: 37 ug/dL — ABNORMAL LOW (ref 45–182)
Saturation Ratios: 10 % — ABNORMAL LOW (ref 17.9–39.5)
TIBC: 356 ug/dL (ref 250–450)
UIBC: 319 ug/dL

## 2023-05-09 LAB — GLUCOSE, CAPILLARY
Glucose-Capillary: 129 mg/dL — ABNORMAL HIGH (ref 70–99)
Glucose-Capillary: 145 mg/dL — ABNORMAL HIGH (ref 70–99)

## 2023-05-09 LAB — POTASSIUM: Potassium: 6.4 mmol/L (ref 3.5–5.1)

## 2023-05-09 LAB — TROPONIN I (HIGH SENSITIVITY)
Troponin I (High Sensitivity): 19 ng/L — ABNORMAL HIGH (ref <18)
Troponin I (High Sensitivity): 24 ng/L — ABNORMAL HIGH (ref <18)

## 2023-05-09 LAB — BRAIN NATRIURETIC PEPTIDE: B Natriuretic Peptide: 414.9 pg/mL — ABNORMAL HIGH (ref 0.0–100.0)

## 2023-05-09 LAB — HIV ANTIBODY (ROUTINE TESTING W REFLEX): HIV Screen 4th Generation wRfx: NONREACTIVE

## 2023-05-09 LAB — CBG MONITORING, ED: Glucose-Capillary: 125 mg/dL — ABNORMAL HIGH (ref 70–99)

## 2023-05-09 MED ORDER — FUROSEMIDE 10 MG/ML IJ SOLN
80.0000 mg | Freq: Once | INTRAMUSCULAR | Status: AC
  Administered 2023-05-09: 80 mg via INTRAVENOUS
  Filled 2023-05-09: qty 8

## 2023-05-09 MED ORDER — ATORVASTATIN CALCIUM 10 MG PO TABS
20.0000 mg | ORAL_TABLET | Freq: Every day | ORAL | Status: DC
  Administered 2023-05-09 – 2023-05-14 (×6): 20 mg via ORAL
  Filled 2023-05-09 (×6): qty 2

## 2023-05-09 MED ORDER — CALCIUM GLUCONATE 10 % IV SOLN
1.0000 g | Freq: Once | INTRAVENOUS | Status: AC
  Administered 2023-05-09: 1 g via INTRAVENOUS
  Filled 2023-05-09: qty 10

## 2023-05-09 MED ORDER — SODIUM ZIRCONIUM CYCLOSILICATE 10 G PO PACK
10.0000 g | PACK | Freq: Once | ORAL | Status: AC
  Administered 2023-05-09: 10 g via ORAL
  Filled 2023-05-09: qty 1

## 2023-05-09 MED ORDER — SODIUM BICARBONATE 8.4 % IV SOLN
50.0000 meq | Freq: Once | INTRAVENOUS | Status: AC
  Administered 2023-05-09: 50 meq via INTRAVENOUS
  Filled 2023-05-09: qty 50

## 2023-05-09 MED ORDER — ALBUTEROL SULFATE (2.5 MG/3ML) 0.083% IN NEBU: 2.5000 mg | INHALATION_SOLUTION | RESPIRATORY_TRACT | Status: DC | PRN

## 2023-05-09 MED ORDER — CALCIUM GLUCONATE-NACL 1-0.675 GM/50ML-% IV SOLN
1.0000 g | Freq: Once | INTRAVENOUS | Status: AC
  Administered 2023-05-09: 1000 mg via INTRAVENOUS
  Filled 2023-05-09: qty 50

## 2023-05-09 MED ORDER — HYDRALAZINE HCL 20 MG/ML IJ SOLN
10.0000 mg | INTRAMUSCULAR | Status: DC | PRN
  Administered 2023-05-09: 10 mg via INTRAVENOUS
  Filled 2023-05-09: qty 1

## 2023-05-09 MED ORDER — ACETAMINOPHEN 650 MG RE SUPP: 650.0000 mg | Freq: Four times a day (QID) | RECTAL | Status: DC | PRN

## 2023-05-09 MED ORDER — SODIUM BICARBONATE 650 MG PO TABS
650.0000 mg | ORAL_TABLET | Freq: Two times a day (BID) | ORAL | Status: DC
  Administered 2023-05-09 – 2023-05-14 (×10): 650 mg via ORAL
  Filled 2023-05-09 (×10): qty 1

## 2023-05-09 MED ORDER — SODIUM CHLORIDE 0.9% FLUSH
3.0000 mL | Freq: Two times a day (BID) | INTRAVENOUS | Status: DC
  Administered 2023-05-09 – 2023-05-14 (×11): 3 mL via INTRAVENOUS

## 2023-05-09 MED ORDER — ACETAMINOPHEN 325 MG PO TABS
650.0000 mg | ORAL_TABLET | Freq: Four times a day (QID) | ORAL | Status: DC | PRN
  Administered 2023-05-11 – 2023-05-14 (×2): 650 mg via ORAL
  Filled 2023-05-09 (×2): qty 2

## 2023-05-09 MED ORDER — INSULIN ASPART 100 UNIT/ML IV SOLN
5.0000 [IU] | Freq: Once | INTRAVENOUS | Status: AC
  Administered 2023-05-09: 5 [IU] via INTRAVENOUS

## 2023-05-09 MED ORDER — ALLOPURINOL 100 MG PO TABS
100.0000 mg | ORAL_TABLET | Freq: Every day | ORAL | Status: DC
  Administered 2023-05-09 – 2023-05-14 (×6): 100 mg via ORAL
  Filled 2023-05-09 (×6): qty 1

## 2023-05-09 MED ORDER — ALBUTEROL SULFATE HFA 108 (90 BASE) MCG/ACT IN AERS: 2.0000 | INHALATION_SPRAY | RESPIRATORY_TRACT | Status: DC | PRN

## 2023-05-09 MED ORDER — INSULIN ASPART 100 UNIT/ML IJ SOLN
0.0000 [IU] | Freq: Three times a day (TID) | INTRAMUSCULAR | Status: DC
  Administered 2023-05-10 – 2023-05-11 (×2): 1 [IU] via SUBCUTANEOUS
  Administered 2023-05-12: 2 [IU] via SUBCUTANEOUS
  Administered 2023-05-13 (×2): 1 [IU] via SUBCUTANEOUS

## 2023-05-09 MED ORDER — DEXTROSE 50 % IV SOLN
1.0000 | Freq: Once | INTRAVENOUS | Status: AC
  Administered 2023-05-09: 50 mL via INTRAVENOUS
  Filled 2023-05-09: qty 50

## 2023-05-09 MED ORDER — APIXABAN 2.5 MG PO TABS
2.5000 mg | ORAL_TABLET | Freq: Two times a day (BID) | ORAL | Status: DC
  Administered 2023-05-09 – 2023-05-10 (×3): 2.5 mg via ORAL
  Filled 2023-05-09 (×3): qty 1

## 2023-05-09 MED ORDER — FUROSEMIDE 10 MG/ML IJ SOLN
80.0000 mg | Freq: Two times a day (BID) | INTRAMUSCULAR | Status: DC
  Administered 2023-05-09 – 2023-05-11 (×4): 80 mg via INTRAVENOUS
  Filled 2023-05-09 (×4): qty 8

## 2023-05-09 NOTE — Consult Note (Addendum)
[facility] Renal Consultation Note    Indication for Consultation:  Management of ESRD/hemodialysis; anemia, hypertension/volume and secondary hyperparathyroidism  JYN:WGNF, Len Blalock, MD  HPI: Gerald Day is a 67 y.o. male with CKD stage 3b with biopsy proven diabetic glomerulosclerosis with some tubular interstitial fibrosis but no evidence of immune mediated GN.  PMH significant for proteinuria, HTN, DMT2, HLD, sleep apnea, and A fib on eliquis.  Previously followed in the office by Dr. Arrie Aran but lost to follow up for the last 3 years with last visit 11/21/2019.  Baseline creatinine 1.5 when in the office, appears to have been trending up over last few years with new baseline likely around 2, prior to this hospitalization.  Patient seen and examined at bedside with son present.  Reports worsening dyspnea with exertion over the last 3 days.  He noted increased swelling in b/l lower extremities starting this AM with a heaviness on his chest and SOB.  Takes furosemide 40mg  once or twice daily depending on swelling.  Admits to increased weakness, heaviness in his legs for the past few days. Noted a tremor in b/l hands starting this AM.  States he started eating less about 1 month ago.  Feels like he is getting full faster.  Denies abdominal pain, orthopnea, current CP and n/v/d.  Has been taking telmisartan and potassium supplements daily.  Admits to taking Aleve 400mg  every few days for months.  No changes in urination, dysuria or hematuria.  Diabetes has been well controlled as far as he knows.  Last A1C 6.8 on 01/06/23.  Admits to increased thirst.  Reports Dr. Jonny Ruiz has been telling him to schedule an appointment with the kidney doctor every 6 months for a while but he felt fine so did not.  Has not been checking his blood pressure at home because the machine batteries are dead.   Pertinent findings in the ED include bradycardia, varying BP 113/84 - 198/64, COVID negative, Scr 6.11,  K 7, BUN 78, CO2 11, Ca 8.5, Hgb 10, WBC 10.9 and CXR with cardiomegaly with mild pulmonary edema and possible small pleural effusions.  Patient being admitted for further evaluation and management. In May of 24 patients crt was 2.17 so kind of a subacute change-  had been taking his ARB, potassium and metformin as well as aleve PTA.   Past Medical History:  Diagnosis Date   Arthritis    IN KNEES   Cellulitis 10/26/2013   LOWER RT EXTREMITY   CELLULITIS/ABSCESS, LEG 06/27/2007   COLONIC POLYPS    CONGESTIVE HEART FAILURE    DIABETES MELLITUS, TYPE II    DIVERTICULOSIS, COLON    ERECTILE DYSFUNCTION, ORGANIC    GOUT NOS    Headache    HYPERLIPIDEMIA    HYPERTENSION    LOW BACK PAIN    Morbid obesity (HCC)    SLEEP APNEA, OBSTRUCTIVE    USES CPAP   Past Surgical History:  Procedure Laterality Date   APPENDECTOMY     CARDIOVERSION N/A 08/02/2020   Procedure: CARDIOVERSION;  Surgeon: Thurmon Fair, MD;  Location: [facility] ENDOSCOPY;  Service: Cardiovascular;  Laterality: N/A;   FOOT SURGERY     LT   TEE WITHOUT CARDIOVERSION N/A 08/02/2020   Procedure: TRANSESOPHAGEAL ECHOCARDIOGRAM (TEE);  Surgeon: Thurmon Fair, MD;  Location: [facility] END[facility];  Service: Cardiovascular;  Laterality: N/A;   TENOTOMY / FLEXOR TENDON TRANSFER Right 03/15/2015   Procedure: TENOTOMY HT REPAIR/ULCER DEBRIDEMENT/GRAFT PREP/ACELL GRAFT APPLICATION;  Surgeon: Sherin Quarry, DPM;  Location: [facility] OR;  Service: Podiatry;  Laterality: Right;  HALLUX   Family History  Problem Relation Age of Onset   Cardiomyopathy Father    Asthma Other    Social History:  reports that he has never smoked. He has never used smokeless tobacco. He reports that he does not currently use alcohol after a past usage of about 3.0 - 4.0 standard drinks of alcohol per week. He reports that he does not use drugs. Allergies  Allergen Reactions   Penicillins Other (See Comments)    Unknown childhood allergic reaction Has patient had a PCN  reaction causing immediate rash, facial/tongue/throat swelling, SOB or lightheadedness with hypotension: Unknown Has patient had a PCN reaction causing severe rash involving mucus membranes or skin necrosis: Unknown Has patient had a PCN reaction that required hospitalization: No Has patient had a PCN reaction occurring within the last 10 years: No If all of the above answers are "NO", then may proceed with Cephalosporin use.    Tizanidine Other (See Comments)    07/26/20: Pt does not recognize drug and does not remember being allergic to it.   Prior to Admission medications   Medication Sig Start Date End Date Taking? Authorizing Provider  allopurinol (ZYLOPRIM) 100 MG tablet Take 1 tablet (100 mg total) by mouth daily. 01/13/23   Corwin Levins, MD  amLODipine (NORVASC) 10 MG tablet Take 1 tablet (10 mg total) by mouth daily. 01/13/23   Corwin Levins, MD  apixaban (ELIQUIS) 5 MG TABS tablet Take 1 tablet (5 mg total) by mouth 2 (two) times daily. 01/13/23   Corwin Levins, MD  atorvastatin (LIPITOR) 20 MG tablet Take 1 tablet (20 mg total) by mouth daily. 01/13/23 01/13/24  Corwin Levins, MD  carvedilol (COREG) 12.5 MG tablet Take 1 tablet (12.5 mg total) by mouth 2 (two) times daily with a meal. 01/13/23   Corwin Levins, MD  Cholecalciferol (THERA-D 2000) 50 MCG (2000 UT) TABS 1 tab by mouth once daily 07/04/21   Corwin Levins, MD  cloNIDine (CATAPRES) 0.2 MG tablet Take 1 tablet (0.2 mg total) by mouth 2 (two) times daily. 01/13/23   Corwin Levins, MD  diltiazem (CARDIZEM CD) 180 MG 24 hr capsule Take 1 capsule (180 mg total) by mouth daily. 01/13/23   Corwin Levins, MD  furosemide (LASIX) 80 MG tablet Take 1 tablet (80 mg total) by mouth daily. 01/13/23   Corwin Levins, MD  metFORMIN (GLUCOPHAGE) 500 MG tablet Take 2 tablets (1,000 mg total) by mouth 2 (two) times daily with a meal. 01/13/23   Corwin Levins, MD  potassium chloride SA (KLOR-CON M) 20 MEQ tablet Take 1 tablet (20 mEq total) by mouth daily. 01/13/23    Corwin Levins, MD  telmisartan (MICARDIS) 80 MG tablet Take 1 tablet (80 mg total) by mouth daily. 01/13/23   Corwin Levins, MD  tirzepatide [facility]) 2.5 MG/0.5ML Pen Inject 2.5 mg into the skin once a week. 01/13/23   Corwin Levins, MD  vitamin B-12 (CYANOCOBALAMIN) 1000 MCG tablet Take 1 tablet (1,000 mcg total) by mouth daily. 06/28/20   Corwin Levins, MD   Current Facility-Administered Medications  Medication Dose Route Frequency Provider Last Rate Last Admin   acetaminophen (TYLENOL) tablet 650 mg  650 mg Oral Q6H PRN Madelyn Flavors A, MD       Or   acetaminophen (TYLENOL) suppository 650 mg  650 mg Rectal Q6H PRN Clydie Braun, MD  albuterol (PROVENTIL) (2.5 MG/3ML) 0.083% nebulizer solution 2.5 mg  2.5 mg Nebulization Q4H PRN Smith, Rondell A, MD       albuterol (VENTOLIN HFA) 108 (90 Base) MCG/ACT inhaler 2 puff  2 puff Inhalation Q2H PRN Rolan Bucco, MD       apixaban (ELIQUIS) tablet 2.5 mg  2.5 mg Oral BID Smith, Rondell A, MD       furosemide (LASIX) injection 80 mg  80 mg Intravenous BID Smith, Rondell A, MD       sodium chloride flush (NS) 0.9 % injection 3 mL  3 mL Intravenous Q12H Smith, Rondell A, MD       sodium zirconium cyclosilicate (LOKELMA) packet 10 g  10 g Oral Once Rolan Bucco, MD       Current Outpatient Medications  Medication Sig Dispense Refill   allopurinol (ZYLOPRIM) 100 MG tablet Take 1 tablet (100 mg total) by mouth daily. 90 tablet 3   amLODipine (NORVASC) 10 MG tablet Take 1 tablet (10 mg total) by mouth daily. 90 tablet 3   apixaban (ELIQUIS) 5 MG TABS tablet Take 1 tablet (5 mg total) by mouth 2 (two) times daily. 180 tablet 3   atorvastatin (LIPITOR) 20 MG tablet Take 1 tablet (20 mg total) by mouth daily. 90 tablet 3   carvedilol (COREG) 12.5 MG tablet Take 1 tablet (12.5 mg total) by mouth 2 (two) times daily with a meal. 180 tablet 3   Cholecalciferol (THERA-D 2000) 50 MCG (2000 UT) TABS 1 tab by mouth once daily 90 tablet 99   cloNIDine  (CATAPRES) 0.2 MG tablet Take 1 tablet (0.2 mg total) by mouth 2 (two) times daily. 180 tablet 3   diltiazem (CARDIZEM CD) 180 MG 24 hr capsule Take 1 capsule (180 mg total) by mouth daily. 90 capsule 3   furosemide (LASIX) 80 MG tablet Take 1 tablet (80 mg total) by mouth daily. 90 tablet 3   metFORMIN (GLUCOPHAGE) 500 MG tablet Take 2 tablets (1,000 mg total) by mouth 2 (two) times daily with a meal. 360 tablet 3   potassium chloride SA (KLOR-CON M) 20 MEQ tablet Take 1 tablet (20 mEq total) by mouth daily. 90 tablet 3   telmisartan (MICARDIS) 80 MG tablet Take 1 tablet (80 mg total) by mouth daily. 90 tablet 3   tirzepatide (MOUNJARO) 2.5 MG/0.5ML Pen Inject 2.5 mg into the skin once a week. 6 mL 3   vitamin B-12 (CYANOCOBALAMIN) 1000 MCG tablet Take 1 tablet (1,000 mcg total) by mouth daily. 90 tablet 3   Labs: Basic Metabolic Panel: Recent Labs  Lab 05/09/23 0922  NA 135  K 7.0*  CL 113*  CO2 11*  GLUCOSE 151*  BUN 78*  CREATININE 6.11*  CALCIUM 8.5*    CBC: Recent Labs  Lab 05/09/23 0922  WBC 10.9*  NEUTROABS 8.6*  HGB 10.0*  HCT 32.4*  MCV 102.2*  PLT 204   CBG: Recent Labs  Lab 05/09/23 1348  GLUCAP 125*   Studies/Results: DG Chest 2 View  Result Date: 05/09/2023 CLINICAL DATA:  Increasing shortness of breath over the last 3 days. Generalized weakness. Chest pain. EXAM: CHEST - 2 VIEW COMPARISON:  Radiographs 07/29/2020, 07/26/2020 and 12/22/2004. FINDINGS: The heart is enlarged. There is vascular congestion with mildly increased interstitial and septal thickening, suspicious for mild pulmonary edema. There is no confluent airspace opacity or pneumothorax. Possible small pleural effusions on the lateral view. Mild degenerative changes in the spine without evidence of acute osseous abnormality.  IMPRESSION: Cardiomegaly with mild pulmonary edema and possible small pleural effusions. Findings suggest mild congestive heart failure. Electronically Signed   By: Carey Bullocks M.D.   On: 05/09/2023 10:22    ROS: All others negative except those listed in HPI.  Physical Exam: Vitals:   05/09/23 0911 05/09/23 0911 05/09/23 1045 05/09/23 1200  BP: (!) 142/59  113/84 (!) 184/64  Pulse: (!) 49  (!) 44 (!) 48  Resp: (!) 27  20 (!) 23  Temp:  98.1 F (36.7 C)    TempSrc:  Oral    SpO2: 100%  100% 97%  Weight:      Height:         General: well developed, pleasant, obese male in NAD Head: NCAT sclera not icteric MMM Neck: Supple. No lymphadenopathy Lungs: CTA bilaterally. +crackles in bases. Increased WOB with conversation Heart: RRR. No murmur, rubs or gallops.  Abdomen: soft, obese, nontender, +BS, no guarding, no rebound tenderness Lower extremities: 2+ edema in b/l calves/feet, +chronic skin changes Neuro: AAOx3. Moves all extremities spontaneously. +tremor in b/l hands, no asterixis Psych:  Responds to questions appropriately with a normal affect.  Assessment/Plan:  AKI on CKD stage 3b/4 - suspect possibly hemodynamically mediated ATN in setting of possible hypotension/CHF exacerbation + NSAID use. Has had progression of CKD since last seen in our office.  Previous biopsy with diabetic nephrosclerosis.  Hx significant proteinuria which was improving on labs in May 2024.  Will hold telmisartan and other hypertensives for now. Hold metformin. Check renal US, UA and alb/Cr.  Renally dose medications.   Hyperkalemia - on potassium supplements at home + telmisartan.  Stop supplements, hold telmisartan.  Given Ca gluconate, dextrose, insulin, albuterol and lokelma.  Check serial K.  If not improved may require dialysis.  Metabolic acidosis - will give amp bicarb and start 650mg  Na bicarb BID. Monitor labs.  Bradycardia - improved with 200cc fluids and 1mg  atropine. Carvedilol on hold.  Diastolic congestive HF exacerbation - pulmonary edema noted on CXR.  IV lasix 80mg  BID ordered.  Echo ordered. Per PMD.  Hypertension  -  at home on clonidine 0.2mg  BID,  amlodipine 10mg , carvedilol 12.5mg  BID, telmisartan 80mg  every day, and furosemide 80mg  every day-  BP is all over the place here-  holding BP meds for now-  use lasix only  Anemia of CKD - Hgb 10. No indication for ESA at this time.  Will check iron studies.  A fib - carvedilol on hold d/t bradycardia DMT2 - recently well controlled with last A1C 6.8. On metformin.  Hold for now.  Sleep apnea - on CPAP Obesity - Would benefit from weight loss to improve overall health and help prevent further progression of kidney disease.   Virgina Norfolk, PA-C [facility] 05/09/2023, 1:53 PM    Patient seen and examined, agree with above note with above modifications. 67 year old WM with biopsy proven diabetic kidney dz previously followed at [facility] but lost to follow up-  now presents with a subacute worsening of renal function ( crt 2.17 in May and now 6.11 with acidosis and hyperkalemia)  had been taking his ARB, potassium and metformin PTA as well as aleve)  Im hoping that most of this will be reversible-  holding ARB, metformin, potassium -  checking ua and ultrasound-  treating potassium aggressively to avoid dialysis in the short term.  Also needs diuresis so giving lasix.  Will follow closely and be sure to attempt to arrange  nephrology OP follow up.  Pt does not understand how ill he is-  is asking about going home.Annie Sable, MD 05/09/2023

## 2023-05-09 NOTE — ED Triage Notes (Signed)
Pt coming in from home with increasing shortness of breath starting Thursday with increasing generalized weekness. Pt is bradycardic at 44 per ems. Pt was given 200 cc of fluid and 1 mg of atropine. Hr increased to 70s.   Ems vitals 3l [place] for comfort from ems  Sp02 98%  Bp 176/66 Cbg 164 Rr 22

## 2023-05-09 NOTE — ED Notes (Signed)
Pt urinated unable to measure

## 2023-05-09 NOTE — Progress Notes (Signed)
   05/09/23 2130  BiPAP/CPAP/SIPAP  $ Non-Invasive Home Ventilator  Initial  $ Face Mask Large  Yes  BiPAP/CPAP/SIPAP Pt Type Adult  BiPAP/CPAP/SIPAP Resmed  Mask Type Nasal mask  Mask Size Large  Respiratory Rate 20 breaths/min  EPAP  (max 18, min 6)  Patient Home Equipment No  Auto Titrate Yes  BiPAP/CPAP /SiPAP Vitals  Pulse Rate 80  Resp 20  SpO2 96 %  Bilateral Breath Sounds Diminished  MEWS Score/Color  MEWS Score 0  MEWS Score Color Gerald Day

## 2023-05-09 NOTE — ED Provider Notes (Signed)
[place] EMERGENCY DEPARTMENT AT [facility] Provider Note   CSN: [id number] Arrival date & time: 05/09/23  0857     History  Chief Complaint  Patient presents with   Shortness of Breath    Gerald Day is a 67 y.o. male.  Patient is a 67 year old male who presents with shortness of breath.  On chart review, he has a history of diabetes, hyperlipidemia, hypertension, chronic kidney disease, paroxysmal atrial fibrillation on Eliquis.  He reports shortness of breath.  He says it started a few days ago with generalized weakness.  He has had a little bit of nasal congestion and a little bit of coughing.  He has had some associated intermittent tightness to the center of his chest and across his chest.  He denies any current chest pain.  He has some baseline leg swelling but says is actually better today than it typically is.  No known fevers.  No pleuritic type chest pain.       Home Medications Prior to Admission medications   Medication Sig Start Date End Date Taking? Authorizing Provider  allopurinol (ZYLOPRIM) 100 MG tablet Take 1 tablet (100 mg total) by mouth daily. 01/13/23   Corwin Levins, MD  amLODipine (NORVASC) 10 MG tablet Take 1 tablet (10 mg total) by mouth daily. 01/13/23   Corwin Levins, MD  apixaban (ELIQUIS) 5 MG TABS tablet Take 1 tablet (5 mg total) by mouth 2 (two) times daily. 01/13/23   Corwin Levins, MD  atorvastatin (LIPITOR) 20 MG tablet Take 1 tablet (20 mg total) by mouth daily. 01/13/23 01/13/24  Corwin Levins, MD  carvedilol (COREG) 12.5 MG tablet Take 1 tablet (12.5 mg total) by mouth 2 (two) times daily with a meal. 01/13/23   Corwin Levins, MD  Cholecalciferol (THERA-D 2000) 50 MCG (2000 UT) TABS 1 tab by mouth once daily 07/04/21   Corwin Levins, MD  cloNIDine (CATAPRES) 0.2 MG tablet Take 1 tablet (0.2 mg total) by mouth 2 (two) times daily. 01/13/23   Corwin Levins, MD  diltiazem (CARDIZEM CD) 180 MG 24 hr capsule Take 1 capsule (180 mg total) by  mouth daily. 01/13/23   Corwin Levins, MD  furosemide (LASIX) 80 MG tablet Take 1 tablet (80 mg total) by mouth daily. 01/13/23   Corwin Levins, MD  metFORMIN (GLUCOPHAGE) 500 MG tablet Take 2 tablets (1,000 mg total) by mouth 2 (two) times daily with a meal. 01/13/23   Corwin Levins, MD  potassium chloride SA (KLOR-CON M) 20 MEQ tablet Take 1 tablet (20 mEq total) by mouth daily. 01/13/23   Corwin Levins, MD  telmisartan (MICARDIS) 80 MG tablet Take 1 tablet (80 mg total) by mouth daily. 01/13/23   Corwin Levins, MD  tirzepatide [facility]) 2.5 MG/0.5ML Pen Inject 2.5 mg into the skin once a week. 01/13/23   Corwin Levins, MD  vitamin B-12 (CYANOCOBALAMIN) 1000 MCG tablet Take 1 tablet (1,000 mcg total) by mouth daily. 06/28/20   Corwin Levins, MD      Allergies    Penicillins and Tizanidine    Review of Systems   Review of Systems  Constitutional:  Positive for fatigue. Negative for chills, diaphoresis and fever.  HENT:  Negative for congestion, rhinorrhea and sneezing.   Eyes: Negative.   Respiratory:  Positive for cough, chest tightness and shortness of breath.   Cardiovascular:  Positive for leg swelling. Negative for chest pain.  Gastrointestinal:  Negative for abdominal pain, blood in stool, diarrhea, nausea and vomiting.  Genitourinary:  Negative for difficulty urinating, flank pain, frequency and hematuria.  Musculoskeletal:  Negative for arthralgias and back pain.  Skin:  Negative for rash.  Neurological:  Negative for dizziness, speech difficulty, weakness, numbness and headaches.    Physical Exam Updated Vital Signs BP (!) 184/64   Pulse (!) 48   Temp 98.1 F (36.7 C) (Oral)   Resp (!) 23   Ht 6' (1.829 m)   Wt (!) 164.7 kg   SpO2 97%   BMI 49.23 kg/m  Physical Exam Constitutional:      Appearance: He is well-developed.  HENT:     Head: Normocephalic and atraumatic.  Eyes:     Pupils: Pupils are equal, round, and reactive to light.  Cardiovascular:     Rate and Rhythm:  Normal rate and regular rhythm.     Heart sounds: Normal heart sounds.  Pulmonary:     Effort: Pulmonary effort is normal. No respiratory distress.     Breath sounds: Normal breath sounds. No wheezing or rales.  Chest:     Chest wall: No tenderness.  Abdominal:     General: Bowel sounds are normal.     Palpations: Abdomen is soft.     Tenderness: There is no abdominal tenderness. There is no guarding or rebound.  Musculoskeletal:        General: Normal range of motion.     Cervical back: Normal range of motion and neck supple.     Comments: 2-3+ pitting edema to lower extremities bilaterally  Lymphadenopathy:     Cervical: No cervical adenopathy.  Skin:    General: Skin is warm and dry.     Findings: No rash.  Neurological:     Mental Status: He is alert and oriented to person, place, and time.     ED Results / Procedures / Treatments   Labs (all labs ordered are listed, but only abnormal results are displayed) Labs Reviewed  BASIC METABOLIC PANEL - Abnormal; Notable for the following components:      Result Value   Potassium 7.0 (*)    Chloride 113 (*)    CO2 11 (*)    Glucose, Bld 151 (*)    BUN 78 (*)    Creatinine, Ser 6.11 (*)    Calcium 8.5 (*)    GFR, Estimated 9 (*)    All other components within normal limits  BRAIN NATRIURETIC PEPTIDE - Abnormal; Notable for the following components:   B Natriuretic Peptide 414.9 (*)    All other components within normal limits  CBC WITH DIFFERENTIAL/PLATELET - Abnormal; Notable for the following components:   WBC 10.9 (*)    RBC 3.17 (*)    Hemoglobin 10.0 (*)    HCT 32.4 (*)    MCV 102.2 (*)    RDW 15.6 (*)    Neutro Abs 8.6 (*)    Abs Immature Granulocytes 0.10 (*)    All other components within normal limits  TROPONIN I (HIGH SENSITIVITY) - Abnormal; Notable for the following components:   Troponin I (High Sensitivity) 19 (*)    All other components within normal limits  TROPONIN I (HIGH SENSITIVITY) - Abnormal;  Notable for the following components:   Troponin I (High Sensitivity) 24 (*)    All other components within normal limits  SARS CORONAVIRUS 2 BY RT PCR    EKG EKG Interpretation Date/Time:  Sunday May 09 2023 09:14:04 EDT Ventricular  Rate:  53 PR Interval:    QRS Duration:  111 QT Interval:  413 QTC Calculation: 388 R Axis:   87  Text Interpretation: Atrial fibrillation Borderline right axis deviation Low voltage, extremity and precordial leads Anteroseptal infarct, old similar to prior EKG.  HR slower Confirmed by Rolan Bucco [phone]) on 05/09/2023 12:07:41 PM  Radiology DG Chest 2 View  Result Date: 05/09/2023 CLINICAL DATA:  Increasing shortness of breath over the last 3 days. Generalized weakness. Chest pain. EXAM: CHEST - 2 VIEW COMPARISON:  Radiographs 07/29/2020, 07/26/2020 and 12/22/2004. FINDINGS: The heart is enlarged. There is vascular congestion with mildly increased interstitial and septal thickening, suspicious for mild pulmonary edema. There is no confluent airspace opacity or pneumothorax. Possible small pleural effusions on the lateral view. Mild degenerative changes in the spine without evidence of acute osseous abnormality. IMPRESSION: Cardiomegaly with mild pulmonary edema and possible small pleural effusions. Findings suggest mild congestive heart failure. Electronically Signed   By: Carey Bullocks M.D.   On: 05/09/2023 10:22    Procedures Procedures    Medications Ordered in ED Medications  albuterol (VENTOLIN HFA) 108 (90 Base) MCG/ACT inhaler 2 puff (has no administration in time range)  sodium zirconium cyclosilicate (LOKELMA) packet 10 g (has no administration in time range)  calcium gluconate inj 10% (1 g) URGENT USE ONLY! (has no administration in time range)  insulin aspart (novoLOG) injection 5 Units (has no administration in time range)    And  dextrose 50 % solution 50 mL (has no administration in time range)  furosemide (LASIX) injection 80  mg (80 mg Intravenous Given 05/09/23 1150)    ED Course/ Medical Decision Making/ A&P                                 Medical Decision Making Amount and/or Complexity of Data Reviewed Labs: ordered. Radiology: ordered.  Risk OTC drugs. Prescription drug management. Decision regarding hospitalization.   Patient is a 67 year old male who presents with shortness of breath and general weakness.  No focal neurologic deficits.  Chest x-ray showed evidence of pulmonary edema.  This was interpreted by me and confirmed by the radiologist.  His troponins are mildly elevated.  EKG does not show any ischemic changes.  He has noted to be bradycardic but his blood pressure is stable.  I reviewed his chart and on his last office visit his heart rate was 42 as well.  His labs reveal evidence of acute renal failure with a creatinine of 6 and a potassium of 7.  His baseline creatinine look to be in the twos.  He was given Lasix as well as Lokelma, insulin and D50 and calcium given his associated bradycardia.  I consulted with Dr. Kathrene Bongo who will see the patient and advise on dialysis.  I spoke with Dr. Katrinka Blazing who will admit the patient for further treatment.  CRITICAL CARE Performed by: Rolan Bucco Total critical care time: 70 minutes Critical care time was exclusive of separately billable procedures and treating other patients. Critical care was necessary to treat or prevent imminent or life-threatening deterioration. Critical care was time spent personally by me on the following activities: development of treatment plan with patient and/or surrogate as well as nursing, discussions with consultants, evaluation of patient's response to treatment, examination of patient, obtaining history from patient or surrogate, ordering and performing treatments and interventions, ordering and review of laboratory studies, ordering and review of  radiographic studies, pulse oximetry and re-evaluation of patient's  condition.   Final Clinical Impression(s) / ED Diagnoses Final diagnoses:  Acute pulmonary edema (HCC)  Hyperkalemia  Acute renal failure, unspecified acute renal failure type [facility])    Rx / DC Orders ED Discharge Orders     None         Rolan Bucco, MD 05/09/23 1320

## 2023-05-09 NOTE — Plan of Care (Signed)
  Problem: Education: Goal: Ability to demonstrate management of disease process will improve Outcome: Progressing Goal: Ability to verbalize understanding of medication therapies will improve Outcome: Progressing Goal: Individualized Educational Video(s) Outcome: Progressing   Problem: Activity: Goal: Capacity to carry out activities will improve Outcome: Progressing   Problem: Cardiac: Goal: Ability to achieve and maintain adequate cardiopulmonary perfusion will improve Outcome: Progressing   Problem: Education: Goal: Knowledge of General Education information will improve Description: Including pain rating scale, medication(s)/side effects and non-pharmacologic comfort measures Outcome: Progressing   Problem: Health Behavior/Discharge Planning: Goal: Ability to manage health-related needs will improve Outcome: Progressing   Problem: Clinical Measurements: Goal: Ability to maintain clinical measurements within normal limits will improve Outcome: Progressing Goal: Will remain free from infection Outcome: Progressing Goal: Diagnostic test results will improve Outcome: Progressing Goal: Respiratory complications will improve Outcome: Progressing Goal: Cardiovascular complication will be avoided Outcome: Progressing   Problem: Activity: Goal: Risk for activity intolerance will decrease Outcome: Progressing   Problem: Nutrition: Goal: Adequate nutrition will be maintained Outcome: Progressing   Problem: Coping: Goal: Level of anxiety will decrease Outcome: Progressing   Problem: Coping: Goal: Level of anxiety will decrease Outcome: Progressing   Problem: Pain Managment: Goal: General experience of comfort will improve Outcome: Progressing   Problem: Safety: Goal: Ability to remain free from injury will improve Outcome: Progressing   Problem: Skin Integrity: Goal: Risk for impaired skin integrity will decrease Outcome: Progressing

## 2023-05-09 NOTE — H&P (Signed)
History and Physical    Patient: Gerald Day:[id number] DOB: [birth year] DOA: 05/09/2023 DOS: the patient was seen and examined on 05/09/2023 PCP: Corwin Levins, MD  Patient coming from: Home  Chief Complaint:  Chief Complaint  Patient presents with   Shortness of Breath   HPI: Gerald Day is a 67 y.o. male with medical history significant of hypertension, hyperlipidemia, congestive heart failure, atrial fibrillation on chronic anticoagulation, chronic kidney disease stage IIIb, OSA, and morbid obesity who presented with complaints of progressively worsening shortness of breath over the last 3.  He initially noted that it was difficult for him to breathe whenever getting up and moving around.  Noted associated feelings of heaviness in his chest, weakness, chronic lower extremity swelling with increased heaviness feelings in his legs, and new tremor in his hands this morning.  At night he sleeps with CPAP, and had reported it being difficult for him to sleep at night.  Denies having any significant abdominal pain, nausea, vomiting, diarrhea, change in urine output,  dysuria, or blood in stools.  Patient has joint pain for which he normally takes Advil 2 pills every 2 to 3 days or so.  He has had a little bit of intermittent cough since being here in the hospital, but had not noticed it prior.  He previously had been followed by nephrology, but had not seen them and a couple of years.  In route with EMS patient was bradycardic with heart rates 44.  Patient had been given 200 cc of fluid and 1 mg of atropine with increase of heart rates into the 70s.  Placed on 3 L nasal cannula oxygen for comfort prior to arrival.   In the emergency department patient was noted to have heart rates 44-67, blood pressures 113/84 -184/64, and O2 saturations maintained on room air. Labs significant for WBC 10.9, hemoglobin 10, potassium 7, CO2 11, BUN 78, creatinine 6.11, BNP 414.9, high-sensitivity troponin  19->24.  Chest x-ray noted cardiomegaly with mild pulmonary edema and possible small pleural effusions.  Patient had initially been given furosemide 80 mg IV, 5 units of insulin, 1 amp of dextrose, calcium gluconate 1 g, albuterol, and 10 g of Lokelma p.o.  Nephrology have been formally consulted.   Review of Systems: As mentioned in the history of present illness. All other systems reviewed and are negative. Past Medical History:  Diagnosis Date   Arthritis    IN KNEES   Cellulitis 10/26/2013   LOWER RT EXTREMITY   CELLULITIS/ABSCESS, LEG 06/27/2007   COLONIC POLYPS    CONGESTIVE HEART FAILURE    DIABETES MELLITUS, TYPE II    DIVERTICULOSIS, COLON    ERECTILE DYSFUNCTION, ORGANIC    GOUT NOS    Headache    HYPERLIPIDEMIA    HYPERTENSION    LOW BACK PAIN    Morbid obesity (HCC)    SLEEP APNEA, OBSTRUCTIVE    USES CPAP   Past Surgical History:  Procedure Laterality Date   APPENDECTOMY     CARDIOVERSION N/A 08/02/2020   Procedure: CARDIOVERSION;  Surgeon: Thurmon Fair, MD;  Location: [facility] ENDOSCOPY;  Service: Cardiovascular;  Laterality: N/A;   FOOT SURGERY     LT   TEE WITHOUT CARDIOVERSION N/A 08/02/2020   Procedure: TRANSESOPHAGEAL ECHOCARDIOGRAM (TEE);  Surgeon: Thurmon Fair, MD;  Location: [facility] ENDOSCOPY;  Service: Cardiovascular;  Laterality: N/A;   TENOTOMY / FLEXOR TENDON TRANSFER Right 03/15/2015   Procedure: TENOTOMY HT REPAIR/ULCER DEBRIDEMENT/GRAFT PREP/ACELL GRAFT APPLICATION;  Surgeon: Ramon Dredge  Suzette Battiest, DPM;  Location: [facility] OR;  Service: Podiatry;  Laterality: Right;  HALLUX   Social History:  reports that he has never smoked. He has never used smokeless tobacco. He reports that he does not currently use alcohol after a past usage of about 3.0 - 4.0 standard drinks of alcohol per week. He reports that he does not use drugs.  Allergies  Allergen Reactions   Penicillins Other (See Comments)    Unknown childhood allergic reaction Has patient had a PCN reaction causing  immediate rash, facial/tongue/throat swelling, SOB or lightheadedness with hypotension: Unknown Has patient had a PCN reaction causing severe rash involving mucus membranes or skin necrosis: Unknown Has patient had a PCN reaction that required hospitalization: No Has patient had a PCN reaction occurring within the last 10 years: No If all of the above answers are "NO", then may proceed with Cephalosporin use.    Tizanidine Other (See Comments)    07/26/20: Pt does not recognize drug and does not remember being allergic to it.    Family History  Problem Relation Age of Onset   Cardiomyopathy Father    Asthma Other     Prior to Admission medications   Medication Sig Start Date End Date Taking? Authorizing Provider  allopurinol (ZYLOPRIM) 100 MG tablet Take 1 tablet (100 mg total) by mouth daily. 01/13/23   Corwin Levins, MD  amLODipine (NORVASC) 10 MG tablet Take 1 tablet (10 mg total) by mouth daily. 01/13/23   Corwin Levins, MD  apixaban (ELIQUIS) 5 MG TABS tablet Take 1 tablet (5 mg total) by mouth 2 (two) times daily. 01/13/23   Corwin Levins, MD  atorvastatin (LIPITOR) 20 MG tablet Take 1 tablet (20 mg total) by mouth daily. 01/13/23 01/13/24  Corwin Levins, MD  carvedilol (COREG) 12.5 MG tablet Take 1 tablet (12.5 mg total) by mouth 2 (two) times daily with a meal. 01/13/23   Corwin Levins, MD  Cholecalciferol (THERA-D 2000) 50 MCG (2000 UT) TABS 1 tab by mouth once daily 07/04/21   Corwin Levins, MD  cloNIDine (CATAPRES) 0.2 MG tablet Take 1 tablet (0.2 mg total) by mouth 2 (two) times daily. 01/13/23   Corwin Levins, MD  diltiazem (CARDIZEM CD) 180 MG 24 hr capsule Take 1 capsule (180 mg total) by mouth daily. 01/13/23   Corwin Levins, MD  furosemide (LASIX) 80 MG tablet Take 1 tablet (80 mg total) by mouth daily. 01/13/23   Corwin Levins, MD  metFORMIN (GLUCOPHAGE) 500 MG tablet Take 2 tablets (1,000 mg total) by mouth 2 (two) times daily with a meal. 01/13/23   Corwin Levins, MD  potassium chloride  SA (KLOR-CON M) 20 MEQ tablet Take 1 tablet (20 mEq total) by mouth daily. 01/13/23   Corwin Levins, MD  telmisartan (MICARDIS) 80 MG tablet Take 1 tablet (80 mg total) by mouth daily. 01/13/23   Corwin Levins, MD  tirzepatide [facility]) 2.5 MG/0.5ML Pen Inject 2.5 mg into the skin once a week. 01/13/23   Corwin Levins, MD  vitamin B-12 (CYANOCOBALAMIN) 1000 MCG tablet Take 1 tablet (1,000 mcg total) by mouth daily. 06/28/20   Corwin Levins, MD    Physical Exam: Vitals:   05/09/23 0911 05/09/23 0911 05/09/23 1045 05/09/23 1200  BP: (!) 142/59  113/84 (!) 184/64  Pulse: (!) 49  (!) 44 (!) 48  Resp: (!) 27  20 (!) 23  Temp:  98.1 F (36.7 C)  TempSrc:  Oral    SpO2: 100%  100% 97%  Weight:      Height:       Constitutional: Obese male currently no acute distress Eyes: PERRL, lids and conjunctivae normal ENMT: Mucous membranes are moist.  Neck: normal, supple       Respiratory: Decreased overall aeration with crackles heard in the lower lung fields.  O2 saturation currently maintained on room air. Cardiovascular: Irregular.  At the knees +2 pitting bilateral lower extremity edema.  2+ pedal pulses. No carotid bruits.  Abdomen: Protuberant abdomen.  No tenderness, no masses palpated. Bowel sounds positive.  Musculoskeletal: no clubbing / cyanosis. No joint deformity upper and lower extremities. Good ROM, no contractures. Normal muscle tone.  Skin: Venous stasis changes of the bilateral lower extremities Neurologic: CN 2-12 grossly intact.   Patient able to move all extremities. Psychiatric: Normal judgment and insight. Alert and oriented x 3. Normal mood.   Data Reviewed:  EKG revealed atrial fibrillation at 53 bpm.  Reviewed labs, imaging, pertinent records as noted above in HPI.  Assessment and Plan:  Diastolic congestive heart failure exacerbation Acute on chronic.  Patient presented with complaints of progressively worsening shortness of breath.  BNP elevated at 414.9.  Chest x-ray  concerning for cardiomegaly with pulmonary edema and small pleural effusions.  Patient had been started on Lasix 80 mg IV. -Admit to a progressive bed -Heart failure order set utilized -Strict I&Os and daily weights -Lasix 80 mg IV twice daily -Check echocardiogram -Holding beta-blocker due to bradycardia  Hyperkalemia Acute.  Potassium elevated at 7.  Patient had been calcium gluconate, dextrose, insulin, albuterol, and Lokelma 10 g. -Recheck potassium level  Atrial fibrillation on chronic anticoagulation Bradycardia  Heart rates were reported to be in 40s upon EMS arrival for which patient had received atropine.  Blood pressures have been maintained.  Unclear if this is related to medications and/or electrolyte abnormalities at this time. -Hold beta-blocker and Cardizem  Acute kidney injury superimposed on chronic kidney disease Patient presents with creatinine elevated up to 6.11 with a BUN 78.  Baseline creatinine previously noted to be around 2.  Unclear if this is secondary to hypoperfusion in setting of CHF exacerbation and/or progression of kidney disease. -Follow-up urine studies and kidney ultrasound -Continue to monitor kidney function with diuresis  Leukocytosis Acute.  WBC elevated at 10.9.  Urinalysis did not show significant signs for infection. -Recheck CBC tomorrow morning.  Macrocytic anemia Acute.  Hemoglobin 10 with elevated MCV of 102.2.  Baseline hemoglobin previously had been 11-12.  No reports of bleeding. -Check CBC in a.m.  Diabetes mellitus type 2, without long-term use of insulin On admission glucose 151.  Home medication regimen to include metformin. -Hypoglycemic protocols -Held metformin due to kidney dysfunction -CBGs before every meal with very sensitive SSI  Essential hypertension Blood pressures appear to range from 113/84 to 184/64. -Hold home blood pressure regimen as recommended by nephrology -Hydralazine IV as needed for elevated blood  pressures  OSA Patient reports using CPAP at night pressure routinely -CPAP per RT  Morbid obesity BMI 50.26 kg/m   DVT prophylaxis: Eliquis Advance Care Planning:   Code Status: Full Code    Consults: Nephrology  Family Communication: Patient's son Riki Rusk updated over the phone.  Severity of Illness: The appropriate patient status for this patient is INPATIENT. Inpatient status is judged to be reasonable and necessary in order to provide the required intensity of service to ensure the patient's safety. The patient's  presenting symptoms, physical exam findings, and initial radiographic and laboratory data in the context of their chronic comorbidities is felt to place them at high risk for further clinical deterioration. Furthermore, it is not anticipated that the patient will be medically stable for discharge from the hospital within 2 midnights of admission.   * I certify that at the point of admission it is my clinical judgment that the patient will require inpatient hospital care spanning beyond 2 midnights from the point of admission due to high intensity of service, high risk for further deterioration and high frequency of surveillance required.*  Author: Clydie Braun, MD 05/09/2023 12:49 PM  For on call review [email or web address].

## 2023-05-09 NOTE — ED Notes (Signed)
ED TO INPATIENT HANDOFF REPORT  ED Nurse Name and Phone #: Marchelle Folks RN [phone]  S Name/Age/Gender Gerald Day 67 y.o. male Room/Bed: 019C/019C  Code Status   Code Status: Full Code  Home/SNF/Other Home Patient oriented to: self, place, time, and situation Is this baseline? Yes   Triage Complete: Triage complete  Chief Complaint Acute on chronic diastolic CHF (congestive heart failure) (HCC) [I50.33]  Triage Note Pt coming in from home with increasing shortness of breath starting Thursday with increasing generalized weekness. Pt is bradycardic at 44 per ems. Pt was given 200 cc of fluid and 1 mg of atropine. Hr increased to 70s.   Ems vitals 3l [place] for comfort from ems  Sp02 98%  Bp 176/66 Cbg 164 Rr 22   Allergies Allergies  Allergen Reactions   Penicillins Other (See Comments)    Unknown childhood allergic reaction Has patient had a PCN reaction causing immediate rash, facial/tongue/throat swelling, SOB or lightheadedness with hypotension: Unknown Has patient had a PCN reaction causing severe rash involving mucus membranes or skin necrosis: Unknown Has patient had a PCN reaction that required hospitalization: No Has patient had a PCN reaction occurring within the last 10 years: No If all of the above answers are "NO", then may proceed with Cephalosporin use.    Tizanidine Other (See Comments)    07/26/20: Pt does not recognize drug and does not remember being allergic to it.    Level of Care/Admitting Diagnosis ED Disposition     ED Disposition  Admit   Condition  --   Comment  Hospital Area: [facility] [[id number]]  Level of Care: Progressive [102]  Admit to Progressive based on following criteria: MULTISYSTEM THREATS such as stable sepsis, metabolic/electrolyte imbalance with or without encephalopathy that is responding to early treatment.  May admit patient to [facility] or [facility] if equivalent level of care is available:: No   Covid Evaluation: Asymptomatic - no recent exposure (last 10 days) testing not required  Diagnosis: Acute on chronic diastolic CHF (congestive heart failure) [facility]) [[id number]]  Admitting Physician: Clydie Braun [[id number]]  Attending Physician: Clydie Braun [[id number]]  Certification:: I certify this patient will need inpatient services for at least 2 midnights  Expected Medical Readiness: 05/12/2023          B Medical/Surgery History Past Medical History:  Diagnosis Date   Arthritis    IN KNEES   Cellulitis 10/26/2013   LOWER RT EXTREMITY   CELLULITIS/ABSCESS, LEG 06/27/2007   COLONIC POLYPS    CONGESTIVE HEART FAILURE    DIABETES MELLITUS, TYPE II    DIVERTICULOSIS, COLON    ERECTILE DYSFUNCTION, ORGANIC    GOUT NOS    Headache    HYPERLIPIDEMIA    HYPERTENSION    LOW BACK PAIN    Morbid obesity (HCC)    SLEEP APNEA, OBSTRUCTIVE    USES CPAP   Past Surgical History:  Procedure Laterality Date   APPENDECTOMY     CARDIOVERSION N/A 08/02/2020   Procedure: CARDIOVERSION;  Surgeon: Thurmon Fair, MD;  Location: [facility] ENDOSCOPY;  Service: Cardiovascular;  Laterality: N/A;   FOOT SURGERY     LT   TEE WITHOUT CARDIOVERSION N/A 08/02/2020   Procedure: TRANSESOPHAGEAL ECHOCARDIOGRAM (TEE);  Surgeon: Thurmon Fair, MD;  Location: [facility] ENDOSCOPY;  Service: Cardiovascular;  Laterality: N/A;   TENOTOMY / FLEXOR TENDON TRANSFER Right 03/15/2015   Procedure: TENOTOMY HT REPAIR/ULCER DEBRIDEMENT/GRAFT PREP/ACELL GRAFT APPLICATION;  Surgeon: Sherin Quarry, DPM;  Location: [facility]  OR;  Service: Podiatry;  Laterality: Right;  HALLUX     A IV Location/Drains/Wounds Patient Lines/Drains/Airways Status     Active Line/Drains/Airways     Name Placement date Placement time Site Days   Peripheral IV 05/09/23 20 G Anterior;Left;Proximal Forearm 05/09/23  0902  Forearm  less than 1   Wound / Incision (Open or Dehisced) 07/26/20 Laceration Toe (Comment  which one) Anterior;Left Left great toe  laceration 07/26/20  2000  Toe (Comment  which one)  1017   Wound / Incision (Open or Dehisced) 08/05/20 Pretibial Left Cellulitis 08/05/20  --  Pretibial  1007            Intake/Output Last 24 hours No intake or output data in the 24 hours ending 05/09/23 1328  Labs/Imaging Results for orders placed or performed during the hospital encounter of 05/09/23 (from the past 48 hour(s))  Basic metabolic panel     Status: Abnormal   Collection Time: 05/09/23  9:22 AM  Result Value Ref Range   Sodium 135 135 - 145 mmol/L   Potassium 7.0 (HH) 3.5 - 5.1 mmol/L    Comment: HEMOLYSIS AT THIS LEVEL MAY AFFECT RESULT CRITICAL RESULT CALLED TO, READ BACK BY AND VERIFIED WITH Che Below, RN AT 1027 ON 05/09/23 BY H. HOWARD.    Chloride 113 (H) 98 - 111 mmol/L   CO2 11 (L) 22 - 32 mmol/L   Glucose, Bld 151 (H) 70 - 99 mg/dL    Comment: Glucose reference range applies only to samples taken after fasting for at least 8 hours.   BUN 78 (H) 8 - 23 mg/dL   Creatinine, Ser 4.78 (H) 0.61 - 1.24 mg/dL   Calcium 8.5 (L) 8.9 - 10.3 mg/dL   GFR, Estimated 9 (L) >60 mL/min    Comment: (NOTE) Calculated using the CKD-EPI Creatinine Equation (2021)    Anion gap 11 5 - 15    Comment: Performed at [facility], [address], [place], [place] [address]  Brain natriuretic peptide     Status: Abnormal   Collection Time: 05/09/23  9:22 AM  Result Value Ref Range   B Natriuretic Peptide 414.9 (H) 0.0 - 100.0 pg/mL    Comment: Performed at [facility], [address], [place], [place] [address]  CBC with Differential     Status: Abnormal   Collection Time: 05/09/23  9:22 AM  Result Value Ref Range   WBC 10.9 (H) 4.0 - 10.5 K/uL   RBC 3.17 (L) 4.22 - 5.81 MIL/uL   Hemoglobin 10.0 (L) 13.0 - 17.0 g/dL   HCT 57.8 (L) 46.9 - 62.9 %   MCV 102.2 (H) 80.0 - 100.0 fL   MCH 31.5 26.0 - 34.0 pg   MCHC 30.9 30.0 - 36.0 g/dL   RDW 52.8 (H) 41.3 - 24.4 %   Platelets 204 150 - 400 K/uL   nRBC 0.0  0.0 - 0.2 %   Neutrophils Relative % 79 %   Neutro Abs 8.6 (H) 1.7 - 7.7 K/uL   Lymphocytes Relative 9 %   Lymphs Abs 1.0 0.7 - 4.0 K/uL   Monocytes Relative 8 %   Monocytes Absolute 0.9 0.1 - 1.0 K/uL   Eosinophils Relative 2 %   Eosinophils Absolute 0.2 0.0 - 0.5 K/uL   Basophils Relative 1 %   Basophils Absolute 0.1 0.0 - 0.1 K/uL   Immature Granulocytes 1 %   Abs Immature Granulocytes 0.10 (H) 0.00 - 0.07 K/uL  Comment: Performed at [facility], [address], [place], [place] [address]  Troponin I (High Sensitivity)     Status: Abnormal   Collection Time: 05/09/23  9:22 AM  Result Value Ref Range   Troponin I (High Sensitivity) 19 (H) <18 ng/L    Comment: (NOTE) Elevated high sensitivity troponin I (hsTnI) values and significant  changes across serial measurements may suggest ACS but many other  chronic and acute conditions are known to elevate hsTnI results.  Refer to the "Links" section for chest pain algorithms and additional  guidance. Performed at [facility], [address], [place], [place] [address]   SARS Coronavirus 2 by RT PCR (hospital order, performed in [facility] hospital lab) *cepheid single result test* Anterior Nasal Swab     Status: None   Collection Time: 05/09/23 10:03 AM   Specimen: Anterior Nasal Swab  Result Value Ref Range   SARS Coronavirus 2 by RT PCR NEGATIVE NEGATIVE    Comment: Performed at [facility], [address] [place], [place] [address]  Troponin I (High Sensitivity)     Status: Abnormal   Collection Time: 05/09/23 10:45 AM  Result Value Ref Range   Troponin I (High Sensitivity) 24 (H) <18 ng/L    Comment: (NOTE) Elevated high sensitivity troponin I (hsTnI) values and significant  changes across serial measurements may suggest ACS but many other  chronic and acute conditions are known to elevate hsTnI results.  Refer to the "Links" section for chest pain algorithms and additional  guidance. Performed at  [facility], [address], [place], [place] [address]    DG Chest 2 View  Result Date: 05/09/2023 CLINICAL DATA:  Increasing shortness of breath over the last 3 days. Generalized weakness. Chest pain. EXAM: CHEST - 2 VIEW COMPARISON:  Radiographs 07/29/2020, 07/26/2020 and 12/22/2004. FINDINGS: The heart is enlarged. There is vascular congestion with mildly increased interstitial and septal thickening, suspicious for mild pulmonary edema. There is no confluent airspace opacity or pneumothorax. Possible small pleural effusions on the lateral view. Mild degenerative changes in the spine without evidence of acute osseous abnormality. IMPRESSION: Cardiomegaly with mild pulmonary edema and possible small pleural effusions. Findings suggest mild congestive heart failure. Electronically Signed   By: Carey Bullocks M.D.   On: 05/09/2023 10:22    Pending Labs Unresulted Labs (From admission, onward)     Start     Ordered   05/10/23 0500  Renal function panel  Daily,   R      05/09/23 1323   05/10/23 0500  CBC  Tomorrow morning,   R        05/09/23 1323   05/09/23 1329  Potassium  Once,   R        05/09/23 1328   05/09/23 1321  HIV Antibody (routine testing w rflx)  (HIV Antibody (Routine testing w reflex) panel)  Once,   R        05/09/23 1323            Vitals/Pain Today's Vitals   05/09/23 0911 05/09/23 0911 05/09/23 1045 05/09/23 1200  BP: (!) 142/59  113/84 (!) 184/64  Pulse: (!) 49  (!) 44 (!) 48  Resp: (!) 27  20 (!) 23  Temp:  98.1 F (36.7 C)    TempSrc:  Oral    SpO2: 100%  100% 97%  Weight:      Height:      PainSc:  Isolation Precautions Airborne and Contact precautions  Medications Medications  albuterol (VENTOLIN HFA) 108 (90 Base) MCG/ACT inhaler 2 puff (has no administration in time range)  sodium zirconium cyclosilicate (LOKELMA) packet 10 g (has no administration in time range)  calcium gluconate inj 10% (1 g) URGENT USE ONLY! (has no  administration in time range)  insulin aspart (novoLOG) injection 5 Units (has no administration in time range)    And  dextrose 50 % solution 50 mL (has no administration in time range)  apixaban (ELIQUIS) tablet 2.5 mg (has no administration in time range)  sodium chloride flush (NS) 0.9 % injection 3 mL (has no administration in time range)  furosemide (LASIX) injection 80 mg (has no administration in time range)  acetaminophen (TYLENOL) tablet 650 mg (has no administration in time range)    Or  acetaminophen (TYLENOL) suppository 650 mg (has no administration in time range)  albuterol (PROVENTIL) (2.5 MG/3ML) 0.083% nebulizer solution 2.5 mg (has no administration in time range)  furosemide (LASIX) injection 80 mg (80 mg Intravenous Given 05/09/23 1150)    Mobility walks with device     Focused Assessments Cardiac Assessment Handoff:    Lab Results  Component Value Date   CKTOTAL 36 10/30/2013   No results found for: "DDIMER" Does the Patient currently have chest pain? No   , Pulmonary Assessment Handoff:  Lung sounds:          R Recommendations: See Admitting Provider Note  Report given to:   Additional Notes:

## 2023-05-09 NOTE — ED Notes (Signed)
Empties 250 from urinal from previous occurrence

## 2023-05-10 ENCOUNTER — Inpatient Hospital Stay (HOSPITAL_COMMUNITY): Payer: Medicare HMO

## 2023-05-10 DIAGNOSIS — I5033 Acute on chronic diastolic (congestive) heart failure: Secondary | ICD-10-CM | POA: Diagnosis not present

## 2023-05-10 DIAGNOSIS — I5031 Acute diastolic (congestive) heart failure: Secondary | ICD-10-CM

## 2023-05-10 LAB — GLUCOSE, CAPILLARY
Glucose-Capillary: 130 mg/dL — ABNORMAL HIGH (ref 70–99)
Glucose-Capillary: 144 mg/dL — ABNORMAL HIGH (ref 70–99)
Glucose-Capillary: 135 mg/dL — ABNORMAL HIGH (ref 70–99)
Glucose-Capillary: 161 mg/dL — ABNORMAL HIGH (ref 70–99)

## 2023-05-10 LAB — CBC
HCT: 32.3 % — ABNORMAL LOW (ref 39.0–52.0)
Hemoglobin: 10.6 g/dL — ABNORMAL LOW (ref 13.0–17.0)
MCH: 32.7 pg (ref 26.0–34.0)
MCHC: 32.8 g/dL (ref 30.0–36.0)
MCV: 99.7 fL (ref 80.0–100.0)
Platelets: 196 10*3/uL (ref 150–400)
RBC: 3.24 MIL/uL — ABNORMAL LOW (ref 4.22–5.81)
RDW: 15.4 % (ref 11.5–15.5)
WBC: 10.5 10*3/uL (ref 4.0–10.5)
nRBC: 0 % (ref 0.0–0.2)

## 2023-05-10 LAB — RENAL FUNCTION PANEL
Albumin: 3.5 g/dL (ref 3.5–5.0)
Anion gap: 8 (ref 5–15)
BUN: 73 mg/dL — ABNORMAL HIGH (ref 8–23)
CO2: 13 mmol/L — ABNORMAL LOW (ref 22–32)
Calcium: 8.9 mg/dL (ref 8.9–10.3)
Chloride: 116 mmol/L — ABNORMAL HIGH (ref 98–111)
Creatinine, Ser: 5.43 mg/dL — ABNORMAL HIGH (ref 0.61–1.24)
GFR, Estimated: 11 mL/min — ABNORMAL LOW (ref >60)
Glucose, Bld: 131 mg/dL — ABNORMAL HIGH (ref 70–99)
Phosphorus: 4.6 mg/dL (ref 2.5–4.6)
Potassium: 5.5 mmol/L — ABNORMAL HIGH (ref 3.5–5.1)
Sodium: 137 mmol/L (ref 135–145)

## 2023-05-10 LAB — ECHOCARDIOGRAM COMPLETE
Area-P 1/2: 3.39 cm2
Height: 72 in
MV M vel: 1.26 m/s
MV Peak grad: 6.4 mmHg
S' Lateral: 3.9 cm
Weight: 5816 [oz_av]

## 2023-05-10 MED ORDER — CARVEDILOL 6.25 MG PO TABS
6.2500 mg | ORAL_TABLET | Freq: Two times a day (BID) | ORAL | Status: DC
  Administered 2023-05-10 – 2023-05-12 (×4): 6.25 mg via ORAL
  Filled 2023-05-10 (×4): qty 1

## 2023-05-10 MED ORDER — SODIUM ZIRCONIUM CYCLOSILICATE 10 G PO PACK
10.0000 g | PACK | Freq: Once | ORAL | Status: AC
  Administered 2023-05-10: 10 g via ORAL
  Filled 2023-05-10: qty 1

## 2023-05-10 MED ORDER — APIXABAN 5 MG PO TABS
5.0000 mg | ORAL_TABLET | Freq: Two times a day (BID) | ORAL | Status: DC
  Administered 2023-05-10 – 2023-05-14 (×8): 5 mg via ORAL
  Filled 2023-05-10 (×8): qty 1

## 2023-05-10 NOTE — Progress Notes (Signed)
Ok to adjust apixaban back to 5mg  PO BID for afib per Dr. Jomarie Longs.  Ulyses Southward, PharmD, BCIDP, AAHIVP, CPP Infectious Disease Pharmacist 05/10/2023 3:06 PM

## 2023-05-10 NOTE — Plan of Care (Signed)
Problem: Education: Goal: Ability to demonstrate management of disease process will improve 05/10/2023 0741 by Joanne Chars, RN Outcome: Progressing 05/09/2023 1742 by Joanne Chars, RN Outcome: Progressing Goal: Ability to verbalize understanding of medication therapies will improve 05/10/2023 0741 by Joanne Chars, RN Outcome: Progressing 05/09/2023 1742 by Joanne Chars, RN Outcome: Progressing Goal: Individualized Educational Video(s) 05/10/2023 0741 by Joanne Chars, RN Outcome: Progressing 05/09/2023 1742 by Joanne Chars, RN Outcome: Progressing   Problem: Activity: Goal: Capacity to carry out activities will improve 05/10/2023 0741 by Joanne Chars, RN Outcome: Progressing 05/09/2023 1742 by Joanne Chars, RN Outcome: Progressing   Problem: Cardiac: Goal: Ability to achieve and maintain adequate cardiopulmonary perfusion will improve 05/10/2023 0741 by Joanne Chars, RN Outcome: Progressing 05/09/2023 1742 by Joanne Chars, RN Outcome: Progressing   Problem: Education: Goal: Knowledge of General Education information will improve Description: Including pain rating scale, medication(s)/side effects and non-pharmacologic comfort measures 05/10/2023 0741 by Joanne Chars, RN Outcome: Progressing 05/09/2023 1742 by Joanne Chars, RN Outcome: Progressing   Problem: Health Behavior/Discharge Planning: Goal: Ability to manage health-related needs will improve 05/10/2023 0741 by Joanne Chars, RN Outcome: Progressing 05/09/2023 1742 by Joanne Chars, RN Outcome: Progressing   Problem: Clinical Measurements: Goal: Ability to maintain clinical measurements within normal limits will improve 05/10/2023 0741 by Joanne Chars, RN Outcome: Progressing 05/09/2023 1742 by Joanne Chars, RN Outcome: Progressing Goal: Will remain free from infection 05/10/2023 0741 by Joanne Chars, RN Outcome: Progressing 05/09/2023 1742 by Joanne Chars, RN Outcome: Progressing Goal: Diagnostic test results will  improve 05/10/2023 0741 by Joanne Chars, RN Outcome: Progressing 05/09/2023 1742 by Joanne Chars, RN Outcome: Progressing Goal: Respiratory complications will improve 05/10/2023 0741 by Joanne Chars, RN Outcome: Progressing 05/09/2023 1742 by Joanne Chars, RN Outcome: Progressing Goal: Cardiovascular complication will be avoided 05/10/2023 0741 by Joanne Chars, RN Outcome: Progressing 05/09/2023 1742 by Joanne Chars, RN Outcome: Progressing   Problem: Activity: Goal: Risk for activity intolerance will decrease 05/10/2023 0741 by Joanne Chars, RN Outcome: Progressing 05/09/2023 1742 by Joanne Chars, RN Outcome: Progressing   Problem: Nutrition: Goal: Adequate nutrition will be maintained 05/10/2023 0741 by Joanne Chars, RN Outcome: Progressing 05/09/2023 1742 by Joanne Chars, RN Outcome: Progressing   Problem: Coping: Goal: Level of anxiety will decrease 05/10/2023 0741 by Joanne Chars, RN Outcome: Progressing 05/09/2023 1742 by Joanne Chars, RN Outcome: Progressing   Problem: Elimination: Goal: Will not experience complications related to bowel motility 05/10/2023 0741 by Joanne Chars, RN Outcome: Progressing 05/09/2023 1742 by Joanne Chars, RN Outcome: Progressing Goal: Will not experience complications related to urinary retention 05/10/2023 0741 by Joanne Chars, RN Outcome: Progressing 05/09/2023 1742 by Joanne Chars, RN Outcome: Progressing   Problem: Pain Managment: Goal: General experience of comfort will improve 05/10/2023 0741 by Joanne Chars, RN Outcome: Progressing 05/09/2023 1742 by Joanne Chars, RN Outcome: Progressing   Problem: Safety: Goal: Ability to remain free from injury will improve 05/10/2023 0741 by Joanne Chars, RN Outcome: Progressing 05/09/2023 1742 by Joanne Chars, RN Outcome: Progressing   Problem: Skin Integrity: Goal: Risk for impaired skin integrity will decrease 05/10/2023 0741 by Joanne Chars, RN Outcome: Progressing 05/09/2023 1742 by  Joanne Chars, RN Outcome: Progressing   Problem: Education: Goal: Ability to describe self-care measures that may prevent or decrease complications (Diabetes Survival Skills Education) will improve 05/10/2023 0741 by Joanne Chars, RN Outcome: Progressing 05/09/2023 1742 by Joanne Chars, RN Outcome: Progressing Goal: Individualized Educational Video(s) 05/10/2023 0741 by Joanne Chars, RN Outcome: Progressing 05/09/2023 1742 by Joanne Chars, RN Outcome: Progressing  Problem: Coping: Goal: Ability to adjust to condition or change in health will improve 05/10/2023 0741 by Joanne Chars, RN Outcome: Progressing 05/09/2023 1742 by Joanne Chars, RN Outcome: Progressing   Problem: Fluid Volume: Goal: Ability to maintain a balanced intake and output will improve 05/10/2023 0741 by Joanne Chars, RN Outcome: Progressing 05/09/2023 1742 by Joanne Chars, RN Outcome: Progressing   Problem: Metabolic: Goal: Ability to maintain appropriate glucose levels will improve 05/10/2023 0741 by Joanne Chars, RN Outcome: Progressing 05/09/2023 1742 by Joanne Chars, RN Outcome: Progressing   Problem: Nutritional: Goal: Maintenance of adequate nutrition will improve 05/10/2023 0741 by Joanne Chars, RN Outcome: Progressing 05/09/2023 1742 by Joanne Chars, RN Outcome: Progressing Goal: Progress toward achieving an optimal weight will improve 05/10/2023 0741 by Joanne Chars, RN Outcome: Progressing 05/09/2023 1742 by Joanne Chars, RN Outcome: Progressing   Problem: Skin Integrity: Goal: Risk for impaired skin integrity will decrease 05/10/2023 0741 by Joanne Chars, RN Outcome: Progressing 05/09/2023 1742 by Joanne Chars, RN Outcome: Progressing   Problem: Tissue Perfusion: Goal: Adequacy of tissue perfusion will improve 05/10/2023 0741 by Joanne Chars, RN Outcome: Progressing 05/09/2023 1742 by Joanne Chars, RN Outcome: Progressing

## 2023-05-10 NOTE — Progress Notes (Signed)
PROGRESS NOTE    Gerald Day  WUJ:[id number] DOB: [birth year] DOA: 05/09/2023 PCP: Corwin Levins, MD  67/M with history of hypertension, dyslipidemia, diastolic CHF, paroxysmal A-fib, CKD 3B, OSA, morbid obesity presented to the ED with progressive shortness of breath, orthopnea and lower extremity edema. -In the ED with bradycardic heart rate in the 40s, hypertensive workup noted potassium of 7, BUN 78, creatinine 6.1, BNP 414, troponin 19, 14, chest x-ray noted cardiomegaly mild pulmonary edema and small pleural effusions   Subjective: -Feels better, breathing is improving  Assessment and Plan:  Acute on chronic diastolic CHF -Volume overloaded with AKI on admission, improving on diuretics, continue Lasix 80 Mg twice daily today -Appreciate nephrology input, follow-up echocardiogram -Restart carvedilol at a lower dose, heart rate in the 80s now -GDMT limited by AKI/CKD   Hyperkalemia -Improving, treated yesterday, repeat Lokelma today   Paroxysmal A-fib Continue apixaban -Holding Cardizem, resume Coreg at a lower dose   Acute kidney injury superimposed on chronic kidney disease -Creatinine 6.1 on admission, baseline around 2  -Suspect acute worsening is multifactorial, cardiorenal, ARB use and NSAID use  -Renal ultrasound negative for hydronephrosis, ARB and metformin on hold -Avoid hypotension, diuretics as above   Leukocytosis -Likely reactive, resolved   Macrocytic anemia -Check anemia panel in a.m.   Diabetes mellitus type 2, without long-term use of insulin -CBG stable, metformin on hold   OSA Patient reports using CPAP at night pressure routinely -CPAP per RT   Morbid obesity BMI 50.26 kg/m     DVT prophylaxis: Eliquis Advance Care Planning:   Code Status: Full Code     Consults: Nephrology   Family Communication: None present Disposition Plan: Home likely 2 to 3 days  Consultants:    Procedures:   Antimicrobials:    Objective: Vitals:    05/09/23 2130 05/10/23 0020 05/10/23 0457 05/10/23 0731  BP:  (!) 180/58 (!) 155/61 (!) 155/55  Pulse: 80 80 86 85  Resp: 20 18 19 20   Temp:  99.1 F (37.3 C) 98.1 F (36.7 C) 98.6 F (37 C)  TempSrc:  Oral Oral Oral  SpO2: 96% 96% 97% 96%  Weight:   (!) 164.9 kg   Height:        Intake/Output Summary (Last 24 hours) at 05/10/2023 1057 Last data filed at 05/10/2023 1016 Gross per 24 hour  Intake 480 ml  Output 4175 ml  Net -3695 ml   Filed Weights   05/09/23 0903 05/09/23 1452 05/10/23 0457  Weight: (!) 164.7 kg (!) 168.1 kg (!) 164.9 kg    Examination:  General exam: Obese chronically ill male sitting up in bed HEENT: Positive JVD CVS: S1-S2, regular rhythm Lungs: Fine basilar Rales Abdomen: Soft, nontender, bowel sounds present Extremities: 1+ edema with chronic skin changes  Psychiatry:  Mood & affect appropriate.     Data Reviewed:   CBC: Recent Labs  Lab 05/09/23 0922 05/10/23 0317  WBC 10.9* 10.5  NEUTROABS 8.6*  --   HGB 10.0* 10.6*  HCT 32.4* 32.3*  MCV 102.2* 99.7  PLT 204 196   Basic Metabolic Panel: Recent Labs  Lab 05/09/23 0922 05/09/23 1557 05/10/23 0317  NA 135  --  137  K 7.0* 6.4* 5.5*  CL 113*  --  116*  CO2 11*  --  13*  GLUCOSE 151*  --  131*  BUN 78*  --  73*  CREATININE 6.11*  --  5.43*  CALCIUM 8.5*  --  8.9  PHOS  --   --  4.6   GFR: Estimated Creatinine Clearance: 20.7 mL/min (A) (by C-G formula based on SCr of 5.43 mg/dL (H)). Liver Function Tests: Recent Labs  Lab 05/10/23 0317  ALBUMIN 3.5   No results for input(s): "LIPASE", "AMYLASE" in the last 168 hours. No results for input(s): "AMMONIA" in the last 168 hours. Coagulation Profile: No results for input(s): "INR", "PROTIME" in the last 168 hours. Cardiac Enzymes: No results for input(s): "CKTOTAL", "CKMB", "CKMBINDEX", "TROPONINI" in the last 168 hours. BNP (last 3 results) No results for input(s): "PROBNP" in the last 8760 hours. HbA1C: No results for  input(s): "HGBA1C" in the last 72 hours. CBG: Recent Labs  Lab 05/09/23 1348 05/09/23 1644 05/09/23 2055 05/10/23 0549  GLUCAP 125* 129* 145* 130*   Lipid Profile: No results for input(s): "CHOL", "HDL", "LDLCALC", "TRIG", "CHOLHDL", "LDLDIRECT" in the last 72 hours. Thyroid Function Tests: No results for input(s): "TSH", "T4TOTAL", "FREET4", "T3FREE", "THYROIDAB" in the last 72 hours. Anemia Panel: Recent Labs    05/09/23 1557  FERRITIN 59  TIBC 356  IRON 37*   Urine analysis:    Component Value Date/Time   COLORURINE STRAW (A) 05/09/2023 1605   APPEARANCEUR CLEAR 05/09/2023 1605   LABSPEC 1.006 05/09/2023 1605   PHURINE 5.0 05/09/2023 1605   GLUCOSEU NEGATIVE 05/09/2023 1605   GLUCOSEU 100 (A) 01/06/2023 0943   HGBUR MODERATE (A) 05/09/2023 1605   BILIRUBINUR NEGATIVE 05/09/2023 1605   KETONESUR NEGATIVE 05/09/2023 1605   PROTEINUR 30 (A) 05/09/2023 1605   UROBILINOGEN 0.2 01/06/2023 0943   NITRITE NEGATIVE 05/09/2023 1605   LEUKOCYTESUR NEGATIVE 05/09/2023 1605   Sepsis Labs: @LABRCNTIP (procalcitonin:4,lacticidven:4)  ) Recent Results (from the past 240 hour(s))  SARS Coronavirus 2 by RT PCR (hospital order, performed in [facility] hospital lab) *cepheid single result test* Anterior Nasal Swab     Status: None   Collection Time: 05/09/23 10:03 AM   Specimen: Anterior Nasal Swab  Result Value Ref Range Status   SARS Coronavirus 2 by RT PCR NEGATIVE NEGATIVE Final    Comment: Performed at [facility], [address], [place], [place] [address]     Radiology Studies: US RENAL  Result Date: 05/09/2023 CLINICAL DATA:  Acute kidney injury. EXAM: RENAL / URINARY TRACT ULTRASOUND COMPLETE COMPARISON:  09/21/2017 FINDINGS: Right Kidney: Renal measurements: 11.5 x 5.6 x 5.8 = volume: 195 mL. Echogenicity within normal limits. No mass or hydronephrosis visualized. Left Kidney: Renal measurements: 11.0 x 6.1 x 6.5 cm = volume: 226 mL. Echogenicity within normal  limits. No mass or hydronephrosis visualized. Bladder: Appears normal for degree of bladder distention. Other: None. IMPRESSION: Mild cortical thinning bilaterally.  No hydronephrosis. Electronically Signed   By: Kennith Center M.D.   On: 05/09/2023 16:43   DG Chest 2 View  Result Date: 05/09/2023 CLINICAL DATA:  Increasing shortness of breath over the last 3 days. Generalized weakness. Chest pain. EXAM: CHEST - 2 VIEW COMPARISON:  Radiographs 07/29/2020, 07/26/2020 and 12/22/2004. FINDINGS: The heart is enlarged. There is vascular congestion with mildly increased interstitial and septal thickening, suspicious for mild pulmonary edema. There is no confluent airspace opacity or pneumothorax. Possible small pleural effusions on the lateral view. Mild degenerative changes in the spine without evidence of acute osseous abnormality. IMPRESSION: Cardiomegaly with mild pulmonary edema and possible small pleural effusions. Findings suggest mild congestive heart failure. Electronically Signed   By: Carey Bullocks M.D.   On: 05/09/2023 10:22     Scheduled Meds:  allopurinol  100 mg Oral  Daily   apixaban  2.5 mg Oral BID   atorvastatin  20 mg Oral Daily   furosemide  80 mg Intravenous BID   insulin aspart  0-6 Units Subcutaneous TID WC   sodium bicarbonate  650 mg Oral BID   sodium chloride flush  3 mL Intravenous Q12H   Continuous Infusions:   LOS: 1 day    Time spent:    Zannie Cove, MD [facility]   05/10/2023, 10:57 AM

## 2023-05-10 NOTE — Plan of Care (Signed)
°  Problem: Clinical Measurements: °Goal: Cardiovascular complication will be avoided °Outcome: Progressing °  °Problem: Activity: °Goal: Risk for activity intolerance will decrease °Outcome: Progressing °  °

## 2023-05-10 NOTE — Progress Notes (Signed)
*  PRELIMINARY RESULTS* Echocardiogram 2D Echocardiogram has been performed.  Maren Reamer 05/10/2023, 3:40 PM

## 2023-05-10 NOTE — Progress Notes (Signed)
Mobility Specialist Progress Note:    05/10/23 1503  Mobility  Activity Ambulated with assistance in hallway  Level of Assistance Contact guard assist, steadying assist  Assistive Device Front wheel walker  Distance Ambulated (ft) 250 ft  Activity Response Tolerated well  Mobility Referral Yes  $Mobility charge 1 Mobility  Mobility Specialist Start Time (ACUTE ONLY) 1419  Mobility Specialist Stop Time (ACUTE ONLY) 1430  Mobility Specialist Time Calculation (min) (ACUTE ONLY) 11 min   Pt received in bed, agreeable to ambulate. No physical assistance needed throughout session. C/o mild SOB but VSS. Returned to room w/o fault. All needs met. Call bell in reach.   Thompson Grayer Mobility Specialist  Please contact vis Secure Chat or  Rehab Office [phone]

## 2023-05-10 NOTE — Discharge Instructions (Signed)

## 2023-05-10 NOTE — Progress Notes (Addendum)
Patient ID: Gerald Day, male   DOB: 1956/05/15, 67 y.o.   MRN: [id number] [facility] Progress Note   Assessment/ Plan:   1. Acute kidney Injury on chronic kidney disease stage IIIb: With underlying/baseline biopsy-proven diabetic chronic kidney disease.  Acute injury suspected to be hemodynamically mediated in the setting of CHF exacerbation and preceding NSAID/ARB use with possible hypotension.  Excellent response to furosemide overnight with improvement of creatinine seen on labs; continue furosemide 80 mg IV twice daily. 2.  Hyperkalemia: On potassium supplements and ARB prior to hospitalization, will continue medical management with encouragement provided by downtrending potassium.  Treated with a repeat dose of Lokelma given this morning. 3.  Metabolic acidosis: This is mixed anion gap/non-anion gap from underlying chronic kidney disease and acute injury.  Will continue sodium bicarbonate supplementation at this time as we monitor labs/renal recovery. 4.  Acute exacerbation of diastolic heart failure: Responding well to intravenous furosemide with excellent urine output noted overnight and downtrending creatinine. 5.  Hypertension: Blood pressure elevated with ongoing diuresis; antihypertensive therapy currently on hold.  Subjective:   Reports to be feeling better and notes an improvement of his shortness of breath this morning.   Objective:   BP (!) 155/55 (BP Location: Right Arm)   Pulse 85   Temp 98.6 F (37 C) (Oral)   Resp 20   Ht 6' (1.829 m)   Wt (!) 164.9 kg   SpO2 96%   BMI 49.30 kg/m   Intake/Output Summary (Last 24 hours) at 05/10/2023 7829 Last data filed at 05/10/2023 0900 Gross per 24 hour  Intake 480 ml  Output 3350 ml  Net -2870 ml   Weight change:   Physical Exam: Gen: Morbidly obese man, comfortably resting in bed, speaking on phone CVS: Pulse regular rhythm, normal rate, S1 and S2 normal Resp: Fine rales bilateral bases, no audible  rhonchi/wheeze Abd: Soft, obese, nontender, bowel sounds normal Ext: 2+ lower extremity edema with venous stasis changes  Imaging: US RENAL  Result Date: 05/09/2023 CLINICAL DATA:  Acute kidney injury. EXAM: RENAL / URINARY TRACT ULTRASOUND COMPLETE COMPARISON:  09/21/2017 FINDINGS: Right Kidney: Renal measurements: 11.5 x 5.6 x 5.8 = volume: 195 mL. Echogenicity within normal limits. No mass or hydronephrosis visualized. Left Kidney: Renal measurements: 11.0 x 6.1 x 6.5 cm = volume: 226 mL. Echogenicity within normal limits. No mass or hydronephrosis visualized. Bladder: Appears normal for degree of bladder distention. Other: None. IMPRESSION: Mild cortical thinning bilaterally.  No hydronephrosis. Electronically Signed   By: Kennith Center M.D.   On: 05/09/2023 16:43   DG Chest 2 View  Result Date: 05/09/2023 CLINICAL DATA:  Increasing shortness of breath over the last 3 days. Generalized weakness. Chest pain. EXAM: CHEST - 2 VIEW COMPARISON:  Radiographs 07/29/2020, 07/26/2020 and 12/22/2004. FINDINGS: The heart is enlarged. There is vascular congestion with mildly increased interstitial and septal thickening, suspicious for mild pulmonary edema. There is no confluent airspace opacity or pneumothorax. Possible small pleural effusions on the lateral view. Mild degenerative changes in the spine without evidence of acute osseous abnormality. IMPRESSION: Cardiomegaly with mild pulmonary edema and possible small pleural effusions. Findings suggest mild congestive heart failure. Electronically Signed   By: Carey Bullocks M.D.   On: 05/09/2023 10:22    Labs: BMET Recent Labs  Lab 05/09/23 0922 05/09/23 1557 05/10/23 0317  NA 135  --  137  K 7.0* 6.4* 5.5*  CL 113*  --  116*  CO2 11*  --  13*  GLUCOSE 151*  --  131*  BUN 78*  --  73*  CREATININE 6.11*  --  5.43*  CALCIUM 8.5*  --  8.9  PHOS  --   --  4.6   CBC Recent Labs  Lab 05/09/23 0922 05/10/23 0317  WBC 10.9* 10.5  NEUTROABS 8.6*   --   HGB 10.0* 10.6*  HCT 32.4* 32.3*  MCV 102.2* 99.7  PLT 204 196    Medications:     allopurinol  100 mg Oral Daily   apixaban  2.5 mg Oral BID   atorvastatin  20 mg Oral Daily   furosemide  80 mg Intravenous BID   insulin aspart  0-6 Units Subcutaneous TID WC   sodium bicarbonate  650 mg Oral BID   sodium chloride flush  3 mL Intravenous Q12H    Zetta Bills, MD 05/10/2023, 9:36 AM

## 2023-05-11 DIAGNOSIS — I5033 Acute on chronic diastolic (congestive) heart failure: Secondary | ICD-10-CM | POA: Diagnosis not present

## 2023-05-11 LAB — RENAL FUNCTION PANEL
Albumin: 3.2 g/dL — ABNORMAL LOW (ref 3.5–5.0)
Anion gap: 11 (ref 5–15)
BUN: 63 mg/dL — ABNORMAL HIGH (ref 8–23)
CO2: 17 mmol/L — ABNORMAL LOW (ref 22–32)
Calcium: 9 mg/dL (ref 8.9–10.3)
Chloride: 108 mmol/L (ref 98–111)
Creatinine, Ser: 4.28 mg/dL — ABNORMAL HIGH (ref 0.61–1.24)
GFR, Estimated: 14 mL/min — ABNORMAL LOW (ref >60)
Glucose, Bld: 225 mg/dL — ABNORMAL HIGH (ref 70–99)
Phosphorus: 4.8 mg/dL — ABNORMAL HIGH (ref 2.5–4.6)
Potassium: 4.2 mmol/L (ref 3.5–5.1)
Sodium: 136 mmol/L (ref 135–145)

## 2023-05-11 LAB — IRON AND TIBC
Iron: 30 ug/dL — ABNORMAL LOW (ref 45–182)
Saturation Ratios: 11 % — ABNORMAL LOW (ref 17.9–39.5)
TIBC: 286 ug/dL (ref 250–450)
UIBC: 256 ug/dL

## 2023-05-11 LAB — CBC
HCT: 32.9 % — ABNORMAL LOW (ref 39.0–52.0)
Hemoglobin: 10.8 g/dL — ABNORMAL LOW (ref 13.0–17.0)
MCH: 31.7 pg (ref 26.0–34.0)
MCHC: 32.8 g/dL (ref 30.0–36.0)
MCV: 96.5 fL (ref 80.0–100.0)
Platelets: 214 10*3/uL (ref 150–400)
RBC: 3.41 MIL/uL — ABNORMAL LOW (ref 4.22–5.81)
RDW: 15 % (ref 11.5–15.5)
WBC: 9.9 10*3/uL (ref 4.0–10.5)
nRBC: 0 % (ref 0.0–0.2)

## 2023-05-11 LAB — RETICULOCYTES
Immature Retic Fract: 17.7 % — ABNORMAL HIGH (ref 2.3–15.9)
RBC.: 3.42 MIL/uL — ABNORMAL LOW (ref 4.22–5.81)
Retic Count, Absolute: 93.7 10*3/uL (ref 19.0–186.0)
Retic Ct Pct: 2.7 % (ref 0.4–3.1)

## 2023-05-11 LAB — FERRITIN: Ferritin: 85 ng/mL (ref 24–336)

## 2023-05-11 LAB — GLUCOSE, CAPILLARY
Glucose-Capillary: 122 mg/dL — ABNORMAL HIGH (ref 70–99)
Glucose-Capillary: 184 mg/dL — ABNORMAL HIGH (ref 70–99)
Glucose-Capillary: 138 mg/dL — ABNORMAL HIGH (ref 70–99)
Glucose-Capillary: 167 mg/dL — ABNORMAL HIGH (ref 70–99)

## 2023-05-11 LAB — VITAMIN B12: Vitamin B-12: 874 pg/mL (ref 180–914)

## 2023-05-11 LAB — FOLATE: Folate: 10.7 ng/mL (ref >5.9)

## 2023-05-11 MED ORDER — FUROSEMIDE 10 MG/ML IJ SOLN
40.0000 mg | Freq: Two times a day (BID) | INTRAMUSCULAR | Status: AC
  Administered 2023-05-11 – 2023-05-12 (×3): 40 mg via INTRAVENOUS
  Filled 2023-05-11 (×3): qty 4

## 2023-05-11 NOTE — Progress Notes (Signed)
Mobility Specialist Progress Note:    05/11/23 1141  Mobility  Activity Ambulated with assistance in hallway  Level of Assistance Contact guard assist, steadying assist  Assistive Device Front wheel walker  Distance Ambulated (ft) 100 ft  Activity Response Tolerated well  Mobility Referral Yes  $Mobility charge 1 Mobility  Mobility Specialist Start Time (ACUTE ONLY) 1120  Mobility Specialist Stop Time (ACUTE ONLY) 1134  Mobility Specialist Time Calculation (min) (ACUTE ONLY) 14 min   Pt received in chair, hesitant but agreeable to ambulate. No physical assistance needed throughout session. Session limited d/t strong back pain. Returned to room w/o fault. Call bell and personal belongings in reach. All needs met.  Thompson Grayer Mobility Specialist  Please contact vis Secure Chat or  Rehab Office [phone]

## 2023-05-11 NOTE — Progress Notes (Signed)
PROGRESS NOTE    QUILLEN PON  ZOX:[id number] DOB: 30-Mar-1956 DOA: 05/09/2023 PCP: Corwin Levins, MD  67/M with history of hypertension, dyslipidemia, diastolic CHF, paroxysmal A-fib, CKD 3B, OSA, morbid obesity presented to the ED with progressive shortness of breath, orthopnea and lower extremity edema. -In the ED with bradycardic heart rate in the 40s, hypertensive workup noted potassium of 7, BUN 78, creatinine 6.1, BNP 414, troponin 19, 14, chest x-ray noted cardiomegaly mild pulmonary edema and small pleural effusions   Subjective: -Feels much better overall, breathing is improving  Assessment and Plan:  Acute on chronic diastolic CHF -Volume overloaded with AKI on admission, improving on diuretics,  -2D echo with EF of 55%, grade 1 DD and normal RV -Appreciate nephrology input -Volume status has improved, 6.1 L negative, BMP pending this morning -Restartee Coreg at a lower dose -GDMT limited by AKI/CKD   Hyperkalemia -Improving   Paroxysmal A-fib Continue apixaban -Holding Cardizem, continue Coreg   Acute kidney injury superimposed on chronic kidney disease -Creatinine 6.1 on admission, baseline around 2  -Suspect acute worsening is multifactorial, cardiorenal, ARB use and NSAID use  -Renal ultrasound negative for hydronephrosis, ARB and metformin on hold -Avoid hypotension, diuretics as above -Labs pending this morning   Leukocytosis -Likely reactive, resolved   Macrocytic anemia -Follow-up anemia panel   Diabetes mellitus type 2, without long-term use of insulin -CBG stable, metformin on hold   OSA Patient reports using CPAP at night pressure routinely -CPAP per RT   Morbid obesity BMI 50.26 kg/m     DVT prophylaxis: Eliquis Advance Care Planning:   Code Status: Full Code     Consults: Nephrology   Family Communication: None present Disposition Plan: Home likely 1 to 2 days  Consultants:    Procedures:   Antimicrobials:     Objective: Vitals:   05/10/23 2019 05/10/23 2353 05/11/23 0415 05/11/23 0850  BP: (!) 136/46 (!) 160/68  (!) 155/69  Pulse:    75  Resp: 19 18 17    Temp: 99.2 F (37.3 C) 99 F (37.2 C) 98.5 F (36.9 C) 98.7 F (37.1 C)  TempSrc: Oral Oral Oral Oral  SpO2:      Weight:   (!) 162.7 kg   Height:        Intake/Output Summary (Last 24 hours) at 05/11/2023 8119 Last data filed at 05/11/2023 1478 Gross per 24 hour  Intake 1434 ml  Output 4725 ml  Net -3291 ml   Filed Weights   05/09/23 1452 05/10/23 0457 05/11/23 0415  Weight: (!) 168.1 kg (!) 164.9 kg (!) 162.7 kg    Examination:  General exam: Obese male sitting up in bed, AAOx3 HEENT: Neck obese unable to assess JVD CVS: S1-S2, regular rhythm Lungs: Few basilar Rales Abdomen: Soft, nontender, bowel sounds present Extremities: Trace edema, chronic skin changes Psychiatry:  Mood & affect appropriate.     Data Reviewed:   CBC: Recent Labs  Lab 05/09/23 0922 05/10/23 0317 05/11/23 0900  WBC 10.9* 10.5 9.9  NEUTROABS 8.6*  --   --   HGB 10.0* 10.6* 10.8*  HCT 32.4* 32.3* 32.9*  MCV 102.2* 99.7 96.5  PLT 204 196 214   Basic Metabolic Panel: Recent Labs  Lab 05/09/23 0922 05/09/23 1557 05/10/23 0317  NA 135  --  137  K 7.0* 6.4* 5.5*  CL 113*  --  116*  CO2 11*  --  13*  GLUCOSE 151*  --  131*  BUN 78*  --  73*  CREATININE 6.11*  --  5.43*  CALCIUM 8.5*  --  8.9  PHOS  --   --  4.6   GFR: Estimated Creatinine Clearance: 20.6 mL/min (A) (by C-G formula based on SCr of 5.43 mg/dL (H)). Liver Function Tests: Recent Labs  Lab 05/10/23 0317  ALBUMIN 3.5   No results for input(s): "LIPASE", "AMYLASE" in the last 168 hours. No results for input(s): "AMMONIA" in the last 168 hours. Coagulation Profile: No results for input(s): "INR", "PROTIME" in the last 168 hours. Cardiac Enzymes: No results for input(s): "CKTOTAL", "CKMB", "CKMBINDEX", "TROPONINI" in the last 168 hours. BNP (last 3 results) No  results for input(s): "PROBNP" in the last 8760 hours. HbA1C: No results for input(s): "HGBA1C" in the last 72 hours. CBG: Recent Labs  Lab 05/10/23 0549 05/10/23 1138 05/10/23 1610 05/10/23 2108 05/11/23 0641  GLUCAP 130* 144* 135* 161* 122*   Lipid Profile: No results for input(s): "CHOL", "HDL", "LDLCALC", "TRIG", "CHOLHDL", "LDLDIRECT" in the last 72 hours. Thyroid Function Tests: No results for input(s): "TSH", "T4TOTAL", "FREET4", "T3FREE", "THYROIDAB" in the last 72 hours. Anemia Panel: Recent Labs    05/09/23 1557 05/11/23 0900  FERRITIN 59  --   TIBC 356  --   IRON 37*  --   RETICCTPCT  --  2.7   Urine analysis:    Component Value Date/Time   COLORURINE STRAW (A) 05/09/2023 1605   APPEARANCEUR CLEAR 05/09/2023 1605   LABSPEC 1.006 05/09/2023 1605   PHURINE 5.0 05/09/2023 1605   GLUCOSEU NEGATIVE 05/09/2023 1605   GLUCOSEU 100 (A) 01/06/2023 0943   HGBUR MODERATE (A) 05/09/2023 1605   BILIRUBINUR NEGATIVE 05/09/2023 1605   KETONESUR NEGATIVE 05/09/2023 1605   PROTEINUR 30 (A) 05/09/2023 1605   UROBILINOGEN 0.2 01/06/2023 0943   NITRITE NEGATIVE 05/09/2023 1605   LEUKOCYTESUR NEGATIVE 05/09/2023 1605   Sepsis Labs: @LABRCNTIP (procalcitonin:4,lacticidven:4)  ) Recent Results (from the past 240 hour(s))  SARS Coronavirus 2 by RT PCR (hospital order, performed in [facility] hospital lab) *cepheid single result test* Anterior Nasal Swab     Status: None   Collection Time: 05/09/23 10:03 AM   Specimen: Anterior Nasal Swab  Result Value Ref Range Status   SARS Coronavirus 2 by RT PCR NEGATIVE NEGATIVE Final    Comment: Performed at [facility], [address] [place], [place] [address]     Radiology Studies: ECHOCARDIOGRAM COMPLETE  Result Date: 05/10/2023    ECHOCARDIOGRAM REPORT   [name] Date of Exam: 05/10/2023 Medical Rec #:  [id number]        Height:       72.0 in Accession #:    [id number]       Weight:       363.5 lb  Date of Birth:  1955/09/20        BSA:          2.746 m Patient Age:    67 years         BP:           134/52 mmHg Patient Gender: M                HR:           90 bpm. Exam Location:  Inpatient Procedure: 2D Echo, Cardiac Doppler and Color Doppler Indications:    CHF-Acute Diastolic I50.31  History:        Patient has prior history of Echocardiogram examinations, most  recent 07/29/2020. CHF, Arrythmias:Atrial Fibrillation and                 Bradycardia; Risk Factors:Diabetes, Sleep Apnea, Hypertension,                 Dyslipidemia and Non-Smoker.  Sonographer:    Aron Baba Referring Phys: Clydie Braun  Sonographer Comments: Patient is obese, suboptimal subcostal window and no apical window. Image acquisition challenging due to respiratory motion. IMPRESSIONS  1. Left ventricular ejection fraction, by estimation, is 55 to 60%. The left ventricle has normal function. The left ventricle has no regional wall motion abnormalities. There is mild concentric left ventricular hypertrophy. Left ventricular diastolic parameters are consistent with Grade I diastolic dysfunction (impaired relaxation).  2. Right ventricular systolic function is normal. The right ventricular size is normal. Tricuspid regurgitation signal is inadequate for assessing PA pressure.  3. The mitral valve is grossly normal. Trivial mitral valve regurgitation.  4. The aortic valve is tricuspid. Aortic valve regurgitation is not visualized.  5. Aortic dilatation noted. There is mild dilatation of the ascending aorta, measuring 42 mm.  6. The inferior vena cava is normal in size with greater than 50% respiratory variability, suggesting right atrial pressure of 3 mmHg. Comparison(s): Prior images reviewed side by side. LVEF normal range at 55-60%. Mildly dilated ascending aorta at 42 mm. FINDINGS  Left Ventricle: Left ventricular ejection fraction, by estimation, is 55 to 60%. The left ventricle has normal function. The left  ventricle has no regional wall motion abnormalities. The left ventricular internal cavity size was normal in size. There is  mild concentric left ventricular hypertrophy. Left ventricular diastolic parameters are consistent with Grade I diastolic dysfunction (impaired relaxation). Right Ventricle: The right ventricular size is normal. No increase in right ventricular wall thickness. Right ventricular systolic function is normal. Tricuspid regurgitation signal is inadequate for assessing PA pressure. The tricuspid regurgitant velocity is 1.17 m/s, and with an assumed right atrial pressure of 3 mmHg, the estimated right ventricular systolic pressure is 8.5 mmHg. Left Atrium: Left atrial size was normal in size. Right Atrium: Right atrial size was normal in size. Pericardium: There is no evidence of pericardial effusion. Mitral Valve: The mitral valve is grossly normal. Trivial mitral valve regurgitation. Tricuspid Valve: The tricuspid valve is grossly normal. Tricuspid valve regurgitation is mild. Aortic Valve: The aortic valve is tricuspid. Aortic valve regurgitation is not visualized. Pulmonic Valve: The pulmonic valve was grossly normal. Pulmonic valve regurgitation is trivial. Aorta: Aortic dilatation noted. There is mild dilatation of the ascending aorta, measuring 42 mm. Venous: The inferior vena cava is normal in size with greater than 50% respiratory variability, suggesting right atrial pressure of 3 mmHg. IAS/Shunts: No atrial level shunt detected by color flow Doppler.  LEFT VENTRICLE PLAX 2D LVIDd:         5.30 cm Diastology LVIDs:         3.90 cm LV e' medial:    4.57 cm/s LV PW:         1.50 cm LV E/e' medial:  17.7 LV IVS:        1.20 cm LV e' lateral:   5.33 cm/s                        LV E/e' lateral: 15.2  RIGHT VENTRICLE RV S prime:     12.20 cm/s TAPSE (M-mode): 1.2 cm LEFT ATRIUM  Index        RIGHT ATRIUM           Index LA diam:      4.70 cm 1.71 cm/m   RA Area:     16.50 cm LA Vol  (A4C): 55.5 ml 20.21 ml/m  RA Volume:   41.20 ml  15.00 ml/m  AORTIC VALVE LVOT Vmax:   78.10 cm/s LVOT Vmean:  49.300 cm/s LVOT VTI:    0.117 m  AORTA Ao Asc diam: 4.20 cm MITRAL VALVE                TRICUSPID VALVE MV Area (PHT): 3.39 cm     TR Peak grad:   5.5 mmHg MV Decel Time: 224 msec     TR Vmax:        117.00 cm/s MR Peak grad: 6.4 mmHg MR Vmax:      126.00 cm/s   SHUNTS MV E velocity: 80.90 cm/s   Systemic VTI: 0.12 m MV A velocity: 101.00 cm/s MV E/A ratio:  0.80 Nona Dell MD Electronically signed by Nona Dell MD Signature Date/Time: 05/10/2023/3:45:37 PM    Final    US RENAL  Result Date: 05/09/2023 CLINICAL DATA:  Acute kidney injury. EXAM: RENAL / URINARY TRACT ULTRASOUND COMPLETE COMPARISON:  09/21/2017 FINDINGS: Right Kidney: Renal measurements: 11.5 x 5.6 x 5.8 = volume: 195 mL. Echogenicity within normal limits. No mass or hydronephrosis visualized. Left Kidney: Renal measurements: 11.0 x 6.1 x 6.5 cm = volume: 226 mL. Echogenicity within normal limits. No mass or hydronephrosis visualized. Bladder: Appears normal for degree of bladder distention. Other: None. IMPRESSION: Mild cortical thinning bilaterally.  No hydronephrosis. Electronically Signed   By: Kennith Center M.D.   On: 05/09/2023 16:43   DG Chest 2 View  Result Date: 05/09/2023 CLINICAL DATA:  Increasing shortness of breath over the last 3 days. Generalized weakness. Chest pain. EXAM: CHEST - 2 VIEW COMPARISON:  Radiographs 07/29/2020, 07/26/2020 and 12/22/2004. FINDINGS: The heart is enlarged. There is vascular congestion with mildly increased interstitial and septal thickening, suspicious for mild pulmonary edema. There is no confluent airspace opacity or pneumothorax. Possible small pleural effusions on the lateral view. Mild degenerative changes in the spine without evidence of acute osseous abnormality. IMPRESSION: Cardiomegaly with mild pulmonary edema and possible small pleural effusions. Findings suggest mild  congestive heart failure. Electronically Signed   By: Carey Bullocks M.D.   On: 05/09/2023 10:22     Scheduled Meds:  allopurinol  100 mg Oral Daily   apixaban  5 mg Oral BID   atorvastatin  20 mg Oral Daily   carvedilol  6.25 mg Oral BID WC   furosemide  40 mg Intravenous BID   insulin aspart  0-6 Units Subcutaneous TID WC   sodium bicarbonate  650 mg Oral BID   sodium chloride flush  3 mL Intravenous Q12H   Continuous Infusions:   LOS: 2 days    Time spent:    Zannie Cove, MD [facility]   05/11/2023, 9:39 AM

## 2023-05-11 NOTE — Plan of Care (Signed)
  Problem: Education: Goal: Ability to demonstrate management of disease process will improve Outcome: Progressing Goal: Ability to verbalize understanding of medication therapies will improve Outcome: Progressing Goal: Individualized Educational Video(s) Outcome: Progressing   Problem: Activity: Goal: Capacity to carry out activities will improve Outcome: Progressing   

## 2023-05-11 NOTE — Plan of Care (Signed)
  Problem: Education: Goal: Ability to demonstrate management of disease process will improve Outcome: Progressing Goal: Ability to verbalize understanding of medication therapies will improve Outcome: Progressing Goal: Individualized Educational Video(s) Outcome: Progressing   Problem: Activity: Goal: Capacity to carry out activities will improve Outcome: Progressing   Problem: Cardiac: Goal: Ability to achieve and maintain adequate cardiopulmonary perfusion will improve Outcome: Progressing   Problem: Education: Goal: Knowledge of General Education information will improve Description: Including pain rating scale, medication(s)/side effects and non-pharmacologic comfort measures Outcome: Progressing   Problem: Clinical Measurements: Goal: Ability to maintain clinical measurements within normal limits will improve Outcome: Progressing Goal: Will remain free from infection Outcome: Progressing Goal: Diagnostic test results will improve Outcome: Progressing Goal: Respiratory complications will improve Outcome: Progressing Goal: Cardiovascular complication will be avoided Outcome: Progressing   Problem: Activity: Goal: Risk for activity intolerance will decrease Outcome: Progressing   Problem: Nutrition: Goal: Adequate nutrition will be maintained Outcome: Progressing   Problem: Coping: Goal: Level of anxiety will decrease Outcome: Progressing   Problem: Elimination: Goal: Will not experience complications related to bowel motility Outcome: Progressing Goal: Will not experience complications related to urinary retention Outcome: Progressing   Problem: Skin Integrity: Goal: Risk for impaired skin integrity will decrease Outcome: Progressing   Problem: Education: Goal: Ability to describe self-care measures that may prevent or decrease complications (Diabetes Survival Skills Education) will improve Outcome: Progressing   Problem: Coping: Goal: Ability to adjust  to condition or change in health will improve Outcome: Progressing   Problem: Fluid Volume: Goal: Ability to maintain a balanced intake and output will improve Outcome: Progressing   Problem: Metabolic: Goal: Ability to maintain appropriate glucose levels will improve Outcome: Progressing   Problem: Nutritional: Goal: Maintenance of adequate nutrition will improve Outcome: Progressing Goal: Progress toward achieving an optimal weight will improve Outcome: Progressing   Problem: Skin Integrity: Goal: Risk for impaired skin integrity will decrease Outcome: Progressing   Problem: Tissue Perfusion: Goal: Adequacy of tissue perfusion will improve Outcome: Progressing

## 2023-05-11 NOTE — Progress Notes (Signed)
Patient ID: Gerald Day, male   DOB: 03-09-1956, 67 y.o.   MRN: [id number] [facility] Progress Note   Assessment/ Plan:   1. Acute kidney Injury on chronic kidney disease stage IIIb: With underlying/baseline biopsy-proven diabetic chronic kidney disease.  Acute injury suspected to be hemodynamically mediated in the setting of CHF exacerbation and preceding NSAID/ARB use with possible hypotension.  Awaiting labs from this morning to assess renal function, polyuric overnight and I will decrease furosemide to 40 mg twice daily. 2.  Hyperkalemia: On potassium supplements and ARB prior to hospitalization, noted to be improving on prior labs and awaiting labs from this morning. 3.  Metabolic acidosis: This is mixed anion gap/non-anion gap from underlying chronic kidney disease and acute injury.  Will continue sodium bicarbonate supplementation at this time as we monitor labs/renal recovery. 4.  Acute exacerbation of diastolic heart failure: Responding well to intravenous furosemide with excellent urine output noted overnight; awaiting labs from this morning. 5.  Hypertension: Blood pressure elevated with ongoing diuresis; antihypertensive therapy currently on hold.  Subjective:   He reports that he continues to feel better and ambulated around the hallways without problems   Objective:   BP (!) 155/69   Pulse 75   Temp 98.7 F (37.1 C) (Oral)   Resp 17   Ht 6' (1.829 m)   Wt (!) 162.7 kg   SpO2 98%   BMI 48.65 kg/m   Intake/Output Summary (Last 24 hours) at 05/11/2023 4098 Last data filed at 05/11/2023 1191 Gross per 24 hour  Intake 1194 ml  Output 4225 ml  Net -3031 ml   Weight change: -1.956 kg  Physical Exam: Gen: Comfortably sitting up on the edge of his bed using urinal CVS: Pulse regular rhythm, normal rate, S1 and S2 normal Resp: Fine rales bilateral bases, no audible rhonchi/wheeze Abd: Soft, obese, nontender, bowel sounds normal Ext: 2+ lower extremity edema  with venous stasis changes  Imaging: ECHOCARDIOGRAM COMPLETE  Result Date: 05/10/2023    ECHOCARDIOGRAM REPORT   [name] Date of Exam: 05/10/2023 Medical Rec #:  [id number]        Height:       72.0 in Accession #:    [id number]       Weight:       363.5 lb Date of Birth:  1956-05-29        BSA:          2.746 m Patient Age:    67 years         BP:           134/52 mmHg Patient Gender: M                HR:           90 bpm. Exam Location:  Inpatient Procedure: 2D Echo, Cardiac Doppler and Color Doppler Indications:    CHF-Acute Diastolic I50.31  History:        Patient has prior history of Echocardiogram examinations, most                 recent 07/29/2020. CHF, Arrythmias:Atrial Fibrillation and                 Bradycardia; Risk Factors:Diabetes, Sleep Apnea, Hypertension,                 Dyslipidemia and Non-Smoker.  Sonographer:    Aron Baba Referring Phys: Clydie Braun  Sonographer Comments: Patient is  obese, suboptimal subcostal window and no apical window. Image acquisition challenging due to respiratory motion. IMPRESSIONS  1. Left ventricular ejection fraction, by estimation, is 55 to 60%. The left ventricle has normal function. The left ventricle has no regional wall motion abnormalities. There is mild concentric left ventricular hypertrophy. Left ventricular diastolic parameters are consistent with Grade I diastolic dysfunction (impaired relaxation).  2. Right ventricular systolic function is normal. The right ventricular size is normal. Tricuspid regurgitation signal is inadequate for assessing PA pressure.  3. The mitral valve is grossly normal. Trivial mitral valve regurgitation.  4. The aortic valve is tricuspid. Aortic valve regurgitation is not visualized.  5. Aortic dilatation noted. There is mild dilatation of the ascending aorta, measuring 42 mm.  6. The inferior vena cava is normal in size with greater than 50% respiratory variability, suggesting right atrial  pressure of 3 mmHg. Comparison(s): Prior images reviewed side by side. LVEF normal range at 55-60%. Mildly dilated ascending aorta at 42 mm. FINDINGS  Left Ventricle: Left ventricular ejection fraction, by estimation, is 55 to 60%. The left ventricle has normal function. The left ventricle has no regional wall motion abnormalities. The left ventricular internal cavity size was normal in size. There is  mild concentric left ventricular hypertrophy. Left ventricular diastolic parameters are consistent with Grade I diastolic dysfunction (impaired relaxation). Right Ventricle: The right ventricular size is normal. No increase in right ventricular wall thickness. Right ventricular systolic function is normal. Tricuspid regurgitation signal is inadequate for assessing PA pressure. The tricuspid regurgitant velocity is 1.17 m/s, and with an assumed right atrial pressure of 3 mmHg, the estimated right ventricular systolic pressure is 8.5 mmHg. Left Atrium: Left atrial size was normal in size. Right Atrium: Right atrial size was normal in size. Pericardium: There is no evidence of pericardial effusion. Mitral Valve: The mitral valve is grossly normal. Trivial mitral valve regurgitation. Tricuspid Valve: The tricuspid valve is grossly normal. Tricuspid valve regurgitation is mild. Aortic Valve: The aortic valve is tricuspid. Aortic valve regurgitation is not visualized. Pulmonic Valve: The pulmonic valve was grossly normal. Pulmonic valve regurgitation is trivial. Aorta: Aortic dilatation noted. There is mild dilatation of the ascending aorta, measuring 42 mm. Venous: The inferior vena cava is normal in size with greater than 50% respiratory variability, suggesting right atrial pressure of 3 mmHg. IAS/Shunts: No atrial level shunt detected by color flow Doppler.  LEFT VENTRICLE PLAX 2D LVIDd:         5.30 cm Diastology LVIDs:         3.90 cm LV e' medial:    4.57 cm/s LV PW:         1.50 cm LV E/e' medial:  17.7 LV IVS:         1.20 cm LV e' lateral:   5.33 cm/s                        LV E/e' lateral: 15.2  RIGHT VENTRICLE RV S prime:     12.20 cm/s TAPSE (M-mode): 1.2 cm LEFT ATRIUM           Index        RIGHT ATRIUM           Index LA diam:      4.70 cm 1.71 cm/m   RA Area:     16.50 cm LA Vol (A4C): 55.5 ml 20.21 ml/m  RA Volume:   41.20 ml  15.00 ml/m  AORTIC VALVE LVOT  Vmax:   78.10 cm/s LVOT Vmean:  49.300 cm/s LVOT VTI:    0.117 m  AORTA Ao Asc diam: 4.20 cm MITRAL VALVE                TRICUSPID VALVE MV Area (PHT): 3.39 cm     TR Peak grad:   5.5 mmHg MV Decel Time: 224 msec     TR Vmax:        117.00 cm/s MR Peak grad: 6.4 mmHg MR Vmax:      126.00 cm/s   SHUNTS MV E velocity: 80.90 cm/s   Systemic VTI: 0.12 m MV A velocity: 101.00 cm/s MV E/A ratio:  0.80 Nona Dell MD Electronically signed by Nona Dell MD Signature Date/Time: 05/10/2023/3:45:37 PM    Final    US RENAL  Result Date: 05/09/2023 CLINICAL DATA:  Acute kidney injury. EXAM: RENAL / URINARY TRACT ULTRASOUND COMPLETE COMPARISON:  09/21/2017 FINDINGS: Right Kidney: Renal measurements: 11.5 x 5.6 x 5.8 = volume: 195 mL. Echogenicity within normal limits. No mass or hydronephrosis visualized. Left Kidney: Renal measurements: 11.0 x 6.1 x 6.5 cm = volume: 226 mL. Echogenicity within normal limits. No mass or hydronephrosis visualized. Bladder: Appears normal for degree of bladder distention. Other: None. IMPRESSION: Mild cortical thinning bilaterally.  No hydronephrosis. Electronically Signed   By: Kennith Center M.D.   On: 05/09/2023 16:43   DG Chest 2 View  Result Date: 05/09/2023 CLINICAL DATA:  Increasing shortness of breath over the last 3 days. Generalized weakness. Chest pain. EXAM: CHEST - 2 VIEW COMPARISON:  Radiographs 07/29/2020, 07/26/2020 and 12/22/2004. FINDINGS: The heart is enlarged. There is vascular congestion with mildly increased interstitial and septal thickening, suspicious for mild pulmonary edema. There is no confluent airspace  opacity or pneumothorax. Possible small pleural effusions on the lateral view. Mild degenerative changes in the spine without evidence of acute osseous abnormality. IMPRESSION: Cardiomegaly with mild pulmonary edema and possible small pleural effusions. Findings suggest mild congestive heart failure. Electronically Signed   By: Carey Bullocks M.D.   On: 05/09/2023 10:22    Labs: BMET Recent Labs  Lab 05/09/23 0922 05/09/23 1557 05/10/23 0317  NA 135  --  137  K 7.0* 6.4* 5.5*  CL 113*  --  116*  CO2 11*  --  13*  GLUCOSE 151*  --  131*  BUN 78*  --  73*  CREATININE 6.11*  --  5.43*  CALCIUM 8.5*  --  8.9  PHOS  --   --  4.6   CBC Recent Labs  Lab 05/09/23 0922 05/10/23 0317  WBC 10.9* 10.5  NEUTROABS 8.6*  --   HGB 10.0* 10.6*  HCT 32.4* 32.3*  MCV 102.2* 99.7  PLT 204 196    Medications:     allopurinol  100 mg Oral Daily   apixaban  5 mg Oral BID   atorvastatin  20 mg Oral Daily   carvedilol  6.25 mg Oral BID WC   furosemide  80 mg Intravenous BID   insulin aspart  0-6 Units Subcutaneous TID WC   sodium bicarbonate  650 mg Oral BID   sodium chloride flush  3 mL Intravenous Q12H    Zetta Bills, MD 05/11/2023, 9:06 AM

## 2023-05-12 DIAGNOSIS — D72829 Elevated white blood cell count, unspecified: Secondary | ICD-10-CM | POA: Diagnosis not present

## 2023-05-12 DIAGNOSIS — I480 Paroxysmal atrial fibrillation: Secondary | ICD-10-CM | POA: Diagnosis not present

## 2023-05-12 DIAGNOSIS — N179 Acute kidney failure, unspecified: Secondary | ICD-10-CM | POA: Diagnosis not present

## 2023-05-12 DIAGNOSIS — I5033 Acute on chronic diastolic (congestive) heart failure: Secondary | ICD-10-CM | POA: Diagnosis not present

## 2023-05-12 LAB — RENAL FUNCTION PANEL
Albumin: 2.9 g/dL — ABNORMAL LOW (ref 3.5–5.0)
Anion gap: 12 (ref 5–15)
BUN: 66 mg/dL — ABNORMAL HIGH (ref 8–23)
CO2: 18 mmol/L — ABNORMAL LOW (ref 22–32)
Calcium: 8.6 mg/dL — ABNORMAL LOW (ref 8.9–10.3)
Chloride: 107 mmol/L (ref 98–111)
Creatinine, Ser: 3.99 mg/dL — ABNORMAL HIGH (ref 0.61–1.24)
GFR, Estimated: 16 mL/min — ABNORMAL LOW (ref >60)
Glucose, Bld: 127 mg/dL — ABNORMAL HIGH (ref 70–99)
Phosphorus: 4.8 mg/dL — ABNORMAL HIGH (ref 2.5–4.6)
Potassium: 3.7 mmol/L (ref 3.5–5.1)
Sodium: 137 mmol/L (ref 135–145)

## 2023-05-12 LAB — GLUCOSE, CAPILLARY
Glucose-Capillary: 123 mg/dL — ABNORMAL HIGH (ref 70–99)
Glucose-Capillary: 208 mg/dL — ABNORMAL HIGH (ref 70–99)
Glucose-Capillary: 122 mg/dL — ABNORMAL HIGH (ref 70–99)
Glucose-Capillary: 157 mg/dL — ABNORMAL HIGH (ref 70–99)

## 2023-05-12 MED ORDER — FUROSEMIDE 40 MG PO TABS
80.0000 mg | ORAL_TABLET | Freq: Every day | ORAL | Status: DC
  Administered 2023-05-13 – 2023-05-14 (×2): 80 mg via ORAL
  Filled 2023-05-12 (×2): qty 2

## 2023-05-12 MED ORDER — CARVEDILOL 12.5 MG PO TABS
12.5000 mg | ORAL_TABLET | Freq: Two times a day (BID) | ORAL | Status: DC
  Administered 2023-05-12 – 2023-05-14 (×4): 12.5 mg via ORAL
  Filled 2023-05-12 (×4): qty 1

## 2023-05-12 NOTE — Assessment & Plan Note (Signed)
Calculated BMI id 50.2

## 2023-05-12 NOTE — Hospital Course (Addendum)
Gerald Day was admitted to the hospital with the working diagnosis of heart failure exacerbation.   67/M with history of hypertension, dyslipidemia, diastolic CHF, paroxysmal A-fib, CKD 3B, OSA, obesity class 3, presented to the ED with progressive shortness of breath, orthopnea and lower extremity edema. Because persistent symptoms EMS was called and he was found bradycardic 44 bpm. In the ED his blood pressure was 113/84, HR 44 to 67, temp 98.1 and 02 saturation 100%, lungs with rales and decreased breath sounds, heart with S1 and S2 present and irregular, abdomen with no distention, positive lower extremity edema ++.  EMS gave 200 cc IV fluids, atropine and transported to the ED.   Na 135, K 7.0. cl 113, bicarbonate 11, glucose 151, bun 78, cr 6.11.  BNP 414 High sensitive troponin 19 and 24  Wbc 10, hgb 10, plt 204  Covid 19 negative   Chest radiograph with cardiomegaly, bilateral hilar vascular congestion, bilateral small pleural effusions.   EKG 53 bpm, right axis deviation, normal intervals, atrial fibrillation rhythm, q waves V1 and V2, no significant ST segment or T wave changes.   Patient had aggressive medical therapy for hyperkalemia along with diuresis with furosemide.  He did not require hemodialysis.   His renal function has been improving. Now transitioned to oral furosemide.  Will need close follow up as outpatient.

## 2023-05-12 NOTE — Assessment & Plan Note (Addendum)
CKD stage 3b, Hyperkalemia.   Volume status has improved.  At the time of his discharge his renal function had a serum cr of 3,29 with K at 3,7 and serum 20.  Na 139 P 4,0   Plan to continue diuresis with furosemide 80 mg po daily. Follow up renal function as outpatient.  Hold on ARB and K supplementation.

## 2023-05-12 NOTE — Assessment & Plan Note (Signed)
Reactive leukocytosis, now resolved.

## 2023-05-12 NOTE — Assessment & Plan Note (Addendum)
Echocardiogram with preserved LV systolic function with EF 55 to 60%, mild LVH, RV systolic function preserved, no significant valvular disease.   Patient was placed on IV furosemide, negative fluid balance was achieved, - 11,297 ml, with significant improvement in his symptoms.  His weight went down by about 10 Kg.   Continue carvedilol, will continue to hold on ARB for now.  Continue with loop diuretic furosemide 80 mg daily. Limited pharmacologic options due to reduced GFR and recovering AKI.

## 2023-05-12 NOTE — Assessment & Plan Note (Addendum)
He was placed on insulin sliding sclae for glucose cover and monitoring.  His glucose has remained stable, at the time of his discharge his fasting glucose was 127 mg/dl.

## 2023-05-12 NOTE — TOC Initial Note (Signed)
Transition of Care [facility]) - Initial/Assessment Note    Patient Details  Name: Gerald Day MRN: [id number] Date of Birth: [birth year]  Transition of Care [facility]) CM/SW Contact:    Leone Haven, RN Phone Number: 05/12/2023, 3:03 PM  Clinical Narrative:                 From home alone, has PCP and insurance on file, states has no HH services in place at this time, he does have a walker and a w/chair just in case he needs it.   States son will transport him  home at [employer] and son is support system, states [facility] [address] and [facility].  Pta self ambulatory   Expected Discharge Plan: Home/Self Care Barriers to Discharge: Continued Medical Work up   Patient Goals and CMS Choice Patient states their goals for this hospitalization and ongoing recovery are:: return home   Choice offered to / list presented to : NA      Expected Discharge Plan and Services In-house Referral: NA Discharge Planning Services: CM Consult Post Acute Care Choice: NA Living arrangements for the past 2 months: Single Family Home                 DME Arranged: N/A DME Agency: NA       HH Arranged: NA          Prior Living Arrangements/Services Living arrangements for the past 2 months: Single Family Home Lives with:: Self Patient language and need for interpreter reviewed:: Yes Do you feel safe going back to the place where you live?: Yes      Need for Family Participation in Patient Care: Yes (Comment) Care giver support system in place?: Yes (comment) Current home services: DME (walker,  w/chair) Criminal Activity/Legal Involvement Pertinent to Current Situation/Hospitalization: No - Comment as needed  Activities of Daily Living Home Assistive Devices/Equipment: Cane (specify quad or straight) ADL Screening (condition at time of admission) Patient's cognitive ability adequate to safely complete daily activities?: Yes Is the patient deaf or have  difficulty hearing?: No Does the patient have difficulty seeing, even when wearing glasses/contacts?: No Does the patient have difficulty concentrating, remembering, or making decisions?: No Patient able to express need for assistance with ADLs?: Yes Does the patient have difficulty dressing or bathing?: No Independently performs ADLs?: Yes (appropriate for developmental age) Does the patient have difficulty walking or climbing stairs?: Yes Weakness of Legs: Both Weakness of Arms/Hands: Both  Permission Sought/Granted Permission sought to share information with : Case Manager Permission granted to share information with : Yes, Verbal Permission Granted              Emotional Assessment   Attitude/Demeanor/Rapport: Engaged Affect (typically observed): Appropriate Orientation: : Oriented to Self, Oriented to Place, Oriented to  Time, Oriented to Situation   Psych Involvement: No (comment)  Admission diagnosis:  Hyperkalemia [E87.5] Acute pulmonary edema (HCC) [J81.0] Acute on chronic diastolic CHF (congestive heart failure) (HCC) [I50.33] Acute renal failure, unspecified acute renal failure type (HCC) [N17.9] Patient Active Problem List   Diagnosis Date Noted   Acute on chronic diastolic CHF (congestive heart failure) (HCC) 05/09/2023   Hyperkalemia 05/09/2023   Bradycardia 05/09/2023   Leukocytosis 05/09/2023   Macrocytic anemia 05/09/2023   Obesity (BMI 35.0-39.9 without comorbidity) 07/06/2021   Low back pain 07/06/2021   Microhematuria 01/02/2021   Right knee pain 10/26/2020   Anticoagulated 08/28/2020   Edema leg 08/28/2020  Paroxysmal atrial fibrillation (HCC)    Sepsis due to cellulitis (HCC) 07/26/2020   Stage 3a chronic kidney disease (HCC) 07/04/2020   Vitamin D deficiency 12/28/2019   B12 deficiency 12/28/2019   Glaucoma 12/22/2018   Radiculopathy, lumbar region 01/17/2018   Constipation 06/18/2017   Acute kidney injury superimposed on chronic kidney  disease (HCC) 06/02/2017   Chronic midline low back pain without sciatica 05/14/2017   Proteinuria 11/22/2016   Skin lesion 11/12/2015   Preop exam for internal medicine 01/16/2015   Cellulitis and abscess of leg 10/26/2013   DM foot ulcer (HCC) 10/23/2013   Encounter for well adult exam with abnormal findings 07/05/2011   History of colonic polyps 09/05/2009   COLONIC POLYPS 03/14/2008   DIVERTICULOSIS, COLON 03/14/2008   Hyperlipidemia 06/27/2007   CELLULITIS/ABSCESS, LEG 06/27/2007   Diabetes mellitus type 2 in obese 05/21/2007   GOUT NOS 05/21/2007   Morbid obesity (HCC) 05/21/2007   ERECTILE DYSFUNCTION 05/21/2007   OSA (obstructive sleep apnea) 05/21/2007   Essential hypertension 05/21/2007   PCP:  Corwin Levins, MD Pharmacy:   [id number] [place], [place] - [address] [place] [address] Phone: [phone] Fax: [phone]  [facility] - [address] [place] - [address] [place] [address] Phone: [phone] Fax: [phone]     Social Determinants of Health (SDOH) Social History: SDOH Screenings   Food Insecurity: No Food Insecurity (02/26/2023)  Housing: Low Risk  (02/26/2023)  Transportation Needs: No Transportation Needs (02/26/2023)  Utilities: Not At Risk (02/26/2023)  Alcohol Screen: Low Risk  (02/26/2023)  Depression (PHQ2-9): Low Risk  (02/26/2023)  Financial Resource Strain: Low Risk  (02/26/2023)  Physical Activity: Sufficiently Active (02/26/2023)  Social Connections: Socially Isolated (02/26/2023)  Stress: No Stress Concern Present (02/26/2023)  Tobacco Use: Low Risk  (05/09/2023)   SDOH Interventions:     Readmission Risk Interventions     No data to display

## 2023-05-12 NOTE — Plan of Care (Signed)

## 2023-05-12 NOTE — Care Management Important Message (Signed)
Important Message  Patient Details  Name: Gerald Day MRN: [id number] Date of Birth: September 02, 1956   Medicare Important Message Given:  Yes     Renie Ora 05/12/2023, 9:13 AM

## 2023-05-12 NOTE — Assessment & Plan Note (Signed)
Stable hgb

## 2023-05-12 NOTE — Assessment & Plan Note (Signed)
 Continue rate control with carvedilol and anticoagulation with apixaban.

## 2023-05-12 NOTE — Progress Notes (Signed)
  Progress Note   Patient: Gerald Day HYQ:[id number] DOB: [birth year] DOA: 05/09/2023     3 DOS: the patient was seen and examined on 05/12/2023   Brief hospital course: Mr. Abrigo was admitted to the hospital with the working diagnosis of heart failure exacerbation.   67/M with history of hypertension, dyslipidemia, diastolic CHF, paroxysmal A-fib, CKD 3B, OSA, morbid obesity presented to the ED with progressive shortness of breath, orthopnea and lower extremity edema. -In the ED with bradycardic heart rate in the 40s, hypertensive workup noted potassium of 7, BUN 78, creatinine 6.1, BNP 414, troponin 19, 14, chest x-ray noted cardiomegaly mild pulmonary edema and small pleural effusion  Assessment and Plan: * Acute on chronic diastolic CHF (congestive heart failure) (HCC) Volume status has improved. Plan to continue diuresis with oral furosemide. Limited pharmacologic options due to reduced GFR and recovering AKI.   Acute kidney injury superimposed on chronic kidney disease (HCC) Hyperkalemia.   Renal function has been recovering.  Volume status has improved.  Continue diuresis with oral loop  diuretic, follow up renal function in am.   Paroxysmal atrial fibrillation (HCC) Continue rate control with carvedilol and anticoagulation with apixaban.   Leukocytosis Reactive leukocytosis, now resolved.   Macrocytic anemia Stable hgb   Diabetes mellitus type 2 in obese Continue insulin sliding sclae for glucose cover and monitoring.  Patient is tolerating po well.   Essential hypertension Continue blood pressure monitoring.   OSA (obstructive sleep apnea) Cpap as outpatient.   Morbid obesity (HCC) Calculated BMI id 50.2         Subjective: Patient with no chest pain or dyspnea, edema has been improving.   Physical Exam: Vitals:   05/12/23 0717 05/12/23 0800 05/12/23 1133 05/12/23 1516  BP: (!) 158/76  135/63 (!) 154/92  Pulse: 79  81 79  Resp: 16  16 15   Temp:  98.4 F (36.9 C)  98.4 F (36.9 C) 98.1 F (36.7 C)  TempSrc: Oral  Oral Oral  SpO2: 97%  99% 96%  Weight:  (!) 160.2 kg    Height:  6' (1.829 m)     Neurology awake and alert ENT with mild pallor Cardiovascular with S1 and S2 present and regular with no gallops, rubs or murmurs Respiratory with no rales or wheezing, no rhonchi Abdomen with no distention  Data Reviewed:    Family Communication: no family at the bedside   Disposition: Status is: Inpatient  Planned Discharge Destination: Home     Author: Coralie Keens, MD 05/12/2023 5:50 PM  For on call review [email or web address].

## 2023-05-12 NOTE — Assessment & Plan Note (Signed)
Cpap as outpatient

## 2023-05-12 NOTE — Assessment & Plan Note (Addendum)
Continue carvedilol for blood pressure control. Hold on telmisartan.  Follow up as outpatient.

## 2023-05-12 NOTE — Progress Notes (Signed)
Patient ID: Gerald Day, male   DOB: February 09, 1956, 67 y.o.   MRN: [id number] [name]   Assessment/ Plan:   1. Acute kidney Injury on chronic kidney disease stage IIIb: With underlying/baseline biopsy-proven diabetic chronic kidney disease.  Acute injury suspected to be hemodynamically mediated in the setting of CHF exacerbation and preceding NSAID/ARB use with possible hypotension.  Renal function improving with diuresis/supportive management and I will transition him to oral furosemide starting tomorrow. 2.  Hyperkalemia: On potassium supplements and ARB prior to hospitalization-now corrected with medical management. 3.  Metabolic acidosis: This is mixed anion gap/non-anion gap from underlying chronic kidney disease and acute injury.  Will continue sodium bicarbonate supplementation at this time as we monitor labs/renal recovery. 4.  Acute exacerbation of diastolic heart failure: Responding well to intravenous furosemide with excellent urine output noted overnight; improving renal function/volume status.  Transition to oral furosemide tomorrow. 5.  Hypertension: Blood pressure elevated with ongoing diuresis; will increase carvedilol to 12.5 mg twice daily (his usual outpatient dose).  Subjective:   Reports that his breathing is getting back towards baseline.  Denies any chest pain.   Objective:   BP (!) 158/76 (BP Location: Left Arm)   Pulse 79   Temp 98.4 F (36.9 C) (Oral)   Resp 16   Ht 6' (1.829 m)   Wt (!) 160.2 kg   SpO2 97%   BMI 47.90 kg/m   Intake/Output Summary (Last 24 hours) at 05/12/2023 0859 Last data filed at 05/12/2023 4098 Gross per 24 hour  Intake 954 ml  Output 3500 ml  Net -2546 ml   Weight change:   Physical Exam: Gen: Comfortably napping in recliner, awakens to conversation CVS: Pulse regular rhythm, normal rate, S1 and S2 normal Resp: Fine rales bilateral bases, no audible rhonchi/wheeze Abd: Soft, obese, nontender, bowel  sounds normal Ext: 2+ lower extremity edema with venous stasis changes  Imaging: ECHOCARDIOGRAM COMPLETE  Result Date: 05/10/2023    ECHOCARDIOGRAM REPORT   [name] Date of Exam: 05/10/2023 Medical Rec #:  [id number]        Height:       72.0 in Accession #:    [id number]       Weight:       363.5 lb Date of Birth:  1956-02-28        BSA:          2.746 m Patient Age:    67 years         BP:           134/52 mmHg Patient Gender: M                HR:           90 bpm. Exam Location:  Inpatient Procedure: 2D Echo, Cardiac Doppler and Color Doppler Indications:    CHF-Acute Diastolic I50.31  History:        Patient has prior history of Echocardiogram examinations, most                 recent 07/29/2020. CHF, Arrythmias:Atrial Fibrillation and                 Bradycardia; Risk Factors:Diabetes, Sleep Apnea, Hypertension,                 Dyslipidemia and Non-Smoker.  Sonographer:    Aron Baba Referring Phys: Clydie Braun  Sonographer Comments: Patient is obese, suboptimal subcostal window  and no apical window. Image acquisition challenging due to respiratory motion. IMPRESSIONS  1. Left ventricular ejection fraction, by estimation, is 55 to 60%. The left ventricle has normal function. The left ventricle has no regional wall motion abnormalities. There is mild concentric left ventricular hypertrophy. Left ventricular diastolic parameters are consistent with Grade I diastolic dysfunction (impaired relaxation).  2. Right ventricular systolic function is normal. The right ventricular size is normal. Tricuspid regurgitation signal is inadequate for assessing PA pressure.  3. The mitral valve is grossly normal. Trivial mitral valve regurgitation.  4. The aortic valve is tricuspid. Aortic valve regurgitation is not visualized.  5. Aortic dilatation noted. There is mild dilatation of the ascending aorta, measuring 42 mm.  6. The inferior vena cava is normal in size with greater than 50%  respiratory variability, suggesting right atrial pressure of 3 mmHg. Comparison(s): Prior images reviewed side by side. LVEF normal range at 55-60%. Mildly dilated ascending aorta at 42 mm. FINDINGS  Left Ventricle: Left ventricular ejection fraction, by estimation, is 55 to 60%. The left ventricle has normal function. The left ventricle has no regional wall motion abnormalities. The left ventricular internal cavity size was normal in size. There is  mild concentric left ventricular hypertrophy. Left ventricular diastolic parameters are consistent with Grade I diastolic dysfunction (impaired relaxation). Right Ventricle: The right ventricular size is normal. No increase in right ventricular wall thickness. Right ventricular systolic function is normal. Tricuspid regurgitation signal is inadequate for assessing PA pressure. The tricuspid regurgitant velocity is 1.17 m/s, and with an assumed right atrial pressure of 3 mmHg, the estimated right ventricular systolic pressure is 8.5 mmHg. Left Atrium: Left atrial size was normal in size. Right Atrium: Right atrial size was normal in size. Pericardium: There is no evidence of pericardial effusion. Mitral Valve: The mitral valve is grossly normal. Trivial mitral valve regurgitation. Tricuspid Valve: The tricuspid valve is grossly normal. Tricuspid valve regurgitation is mild. Aortic Valve: The aortic valve is tricuspid. Aortic valve regurgitation is not visualized. Pulmonic Valve: The pulmonic valve was grossly normal. Pulmonic valve regurgitation is trivial. Aorta: Aortic dilatation noted. There is mild dilatation of the ascending aorta, measuring 42 mm. Venous: The inferior vena cava is normal in size with greater than 50% respiratory variability, suggesting right atrial pressure of 3 mmHg. IAS/Shunts: No atrial level shunt detected by color flow Doppler.  LEFT VENTRICLE PLAX 2D LVIDd:         5.30 cm Diastology LVIDs:         3.90 cm LV e' medial:    4.57 cm/s LV PW:          1.50 cm LV E/e' medial:  17.7 LV IVS:        1.20 cm LV e' lateral:   5.33 cm/s                        LV E/e' lateral: 15.2  RIGHT VENTRICLE RV S prime:     12.20 cm/s TAPSE (M-mode): 1.2 cm LEFT ATRIUM           Index        RIGHT ATRIUM           Index LA diam:      4.70 cm 1.71 cm/m   RA Area:     16.50 cm LA Vol (A4C): 55.5 ml 20.21 ml/m  RA Volume:   41.20 ml  15.00 ml/m  AORTIC VALVE LVOT Vmax:   78.10  cm/s LVOT Vmean:  49.300 cm/s LVOT VTI:    0.117 m  AORTA Ao Asc diam: 4.20 cm MITRAL VALVE                TRICUSPID VALVE MV Area (PHT): 3.39 cm     TR Peak grad:   5.5 mmHg MV Decel Time: 224 msec     TR Vmax:        117.00 cm/s MR Peak grad: 6.4 mmHg MR Vmax:      126.00 cm/s   SHUNTS MV E velocity: 80.90 cm/s   Systemic VTI: 0.12 m MV A velocity: 101.00 cm/s MV E/A ratio:  0.80 Nona Dell MD Electronically signed by Nona Dell MD Signature Date/Time: 05/10/2023/3:45:37 PM    Final     Labs: BMET Recent Labs  Lab 05/09/23 7846 05/09/23 1557 05/10/23 0317 05/11/23 0900 05/12/23 0242  NA 135  --  137 136 137  K 7.0* 6.4* 5.5* 4.2 3.7  CL 113*  --  116* 108 107  CO2 11*  --  13* 17* 18*  GLUCOSE 151*  --  131* 225* 127*  BUN 78*  --  73* 63* 66*  CREATININE 6.11*  --  5.43* 4.28* 3.99*  CALCIUM 8.5*  --  8.9 9.0 8.6*  PHOS  --   --  4.6 4.8* 4.8*   CBC Recent Labs  Lab 05/09/23 0922 05/10/23 0317 05/11/23 0900  WBC 10.9* 10.5 9.9  NEUTROABS 8.6*  --   --   HGB 10.0* 10.6* 10.8*  HCT 32.4* 32.3* 32.9*  MCV 102.2* 99.7 96.5  PLT 204 196 214    Medications:     allopurinol  100 mg Oral Daily   apixaban  5 mg Oral BID   atorvastatin  20 mg Oral Daily   carvedilol  6.25 mg Oral BID WC   furosemide  40 mg Intravenous BID   insulin aspart  0-6 Units Subcutaneous TID WC   sodium bicarbonate  650 mg Oral BID   sodium chloride flush  3 mL Intravenous Q12H    Zetta Bills, MD 05/12/2023, 8:59 AM

## 2023-05-13 ENCOUNTER — Other Ambulatory Visit: Payer: Self-pay

## 2023-05-13 DIAGNOSIS — I480 Paroxysmal atrial fibrillation: Secondary | ICD-10-CM | POA: Diagnosis not present

## 2023-05-13 DIAGNOSIS — I5033 Acute on chronic diastolic (congestive) heart failure: Secondary | ICD-10-CM | POA: Diagnosis not present

## 2023-05-13 DIAGNOSIS — N179 Acute kidney failure, unspecified: Secondary | ICD-10-CM | POA: Diagnosis not present

## 2023-05-13 DIAGNOSIS — D72829 Elevated white blood cell count, unspecified: Secondary | ICD-10-CM | POA: Diagnosis not present

## 2023-05-13 LAB — RENAL FUNCTION PANEL
Albumin: 2.9 g/dL — ABNORMAL LOW (ref 3.5–5.0)
Anion gap: 10 (ref 5–15)
BUN: 68 mg/dL — ABNORMAL HIGH (ref 8–23)
CO2: 21 mmol/L — ABNORMAL LOW (ref 22–32)
Calcium: 8.6 mg/dL — ABNORMAL LOW (ref 8.9–10.3)
Chloride: 106 mmol/L (ref 98–111)
Creatinine, Ser: 4.11 mg/dL — ABNORMAL HIGH (ref 0.61–1.24)
GFR, Estimated: 15 mL/min — ABNORMAL LOW (ref >60)
Glucose, Bld: 129 mg/dL — ABNORMAL HIGH (ref 70–99)
Phosphorus: 4.6 mg/dL (ref 2.5–4.6)
Potassium: 4.1 mmol/L (ref 3.5–5.1)
Sodium: 137 mmol/L (ref 135–145)

## 2023-05-13 LAB — GLUCOSE, CAPILLARY
Glucose-Capillary: 127 mg/dL — ABNORMAL HIGH (ref 70–99)
Glucose-Capillary: 171 mg/dL — ABNORMAL HIGH (ref 70–99)
Glucose-Capillary: 153 mg/dL — ABNORMAL HIGH (ref 70–99)
Glucose-Capillary: 161 mg/dL — ABNORMAL HIGH (ref 70–99)

## 2023-05-13 NOTE — Progress Notes (Signed)
Mobility Specialist Progress Note:    05/13/23 1049  Mobility  Activity Ambulated with assistance in hallway  Level of Assistance Contact guard assist, steadying assist  Assistive Device Front wheel walker  Distance Ambulated (ft) 300 ft  Activity Response Tolerated well  Mobility Referral Yes  $Mobility charge 1 Mobility  Mobility Specialist Start Time (ACUTE ONLY) 0920  Mobility Specialist Stop Time (ACUTE ONLY) 0935  Mobility Specialist Time Calculation (min) (ACUTE ONLY) 15 min   Received pt in chair having no complaints and agreeable to mobility. Pt was asymptomatic throughout ambulation and returned to room w/o fault. Left in chair w/ call bell in reach and all needs met.   Thompson Grayer Mobility Specialist  Please contact vis Secure Chat or  Rehab Office [phone]

## 2023-05-13 NOTE — Progress Notes (Signed)
Patient ID: Gerald Day, male   DOB: 1955-11-18, 67 y.o.   MRN: [id number] [facility] Progress Note   Assessment/ Plan:   1. Acute kidney Injury on chronic kidney disease stage IIIb: With underlying/baseline biopsy-proven diabetic chronic kidney disease.  Acute injury suspected to be hemodynamically mediated in the setting of CHF exacerbation and preceding NSAID/ARB use with possible hypotension.  Creatinine slightly higher on labs this morning and possibly indicative of mild intravascular volume contraction further justifying the transition from intravenous to oral furosemide beginning today.  May possibly able to discharge him tomorrow if renal function stable/better. 2.  Hyperkalemia: On potassium supplements and ARB prior to hospitalization-now corrected with medical management.  Would hold on restarting ARB until renal function back to baseline (creatinine in the low 2s). 3.  Metabolic acidosis: This is mixed anion gap/non-anion gap from underlying chronic kidney disease and acute injury.  Will continue sodium bicarbonate supplementation at this time as we monitor labs/renal recovery. 4.  Acute exacerbation of diastolic heart failure: Responding well to intravenous furosemide with excellent urine output noted overnight; improving renal function/volume status.  Starting oral furosemide today. 5.  Hypertension: Blood pressure elevated with ongoing diuresis; increased carvedilol to 12.5 mg twice daily yesterday (his usual outpatient dose).  Subjective:   Reports that his breathing is getting back towards baseline.  Denies any chest pain.   Objective:   BP (!) 167/87 (BP Location: Left Arm)   Pulse 97   Temp 97.6 F (36.4 C) (Oral)   Resp 16   Ht 6' (1.829 m)   Wt (!) 159 kg   SpO2 91%   BMI 47.55 kg/m   Intake/Output Summary (Last 24 hours) at 05/13/2023 0836 Last data filed at 05/13/2023 2536 Gross per 24 hour  Intake 840 ml  Output 2275 ml  Net -1435 ml   Weight  change:   Physical Exam: Gen: Sitting up comfortably on the side of his bed CVS: Pulse regular rhythm, normal rate, S1 and S2 normal Resp: Distant breath sounds bilaterally without audible rhonchi/rales Abd: Soft, obese, nontender, bowel sounds normal Ext: 1-2+ lower extremity edema with venous stasis changes  Imaging: No results found.  Labs: BMET Recent Labs  Lab 05/09/23 0922 05/09/23 1557 05/10/23 0317 05/11/23 0900 05/12/23 0242 05/13/23 0249  NA 135  --  137 136 137 137  K 7.0* 6.4* 5.5* 4.2 3.7 4.1  CL 113*  --  116* 108 107 106  CO2 11*  --  13* 17* 18* 21*  GLUCOSE 151*  --  131* 225* 127* 129*  BUN 78*  --  73* 63* 66* 68*  CREATININE 6.11*  --  5.43* 4.28* 3.99* 4.11*  CALCIUM 8.5*  --  8.9 9.0 8.6* 8.6*  PHOS  --   --  4.6 4.8* 4.8* 4.6   CBC Recent Labs  Lab 05/09/23 0922 05/10/23 0317 05/11/23 0900  WBC 10.9* 10.5 9.9  NEUTROABS 8.6*  --   --   HGB 10.0* 10.6* 10.8*  HCT 32.4* 32.3* 32.9*  MCV 102.2* 99.7 96.5  PLT 204 196 214    Medications:     allopurinol  100 mg Oral Daily   apixaban  5 mg Oral BID   atorvastatin  20 mg Oral Daily   carvedilol  12.5 mg Oral BID WC   furosemide  80 mg Oral Daily   insulin aspart  0-6 Units Subcutaneous TID WC   sodium bicarbonate  650 mg Oral BID   sodium chloride  flush  3 mL Intravenous Q12H    Zetta Bills, MD 05/13/2023, 8:36 AM

## 2023-05-13 NOTE — Progress Notes (Signed)
  Progress Note   Patient: Gerald Day:[id number] DOB: 09-06-1956 DOA: 05/09/2023     4 DOS: the patient was seen and examined on 05/13/2023   Brief hospital course: Mr. Heidorn was admitted to the hospital with the working diagnosis of heart failure exacerbation.   67/M with history of hypertension, dyslipidemia, diastolic CHF, paroxysmal A-fib, CKD 3B, OSA, morbid obesity presented to the ED with progressive shortness of breath, orthopnea and lower extremity edema. Because persistent symptoms EMS was called and he was found bradycardic 44 bpm. He received 200 cc IV fluids, atropine and was transported to the ED. On his initial physical examination in the ED his blood pressure was 113/84, 184/64, HR 44 to 67, RR 27 and 02 saturation 97%, heart with S1 and S2 present, bradycardic and irregular, respiratory with rales bilaterally, and decreased breath sounds, abdomen wit no distention, positive lower extremity edema.   workup noted potassium of 7, BUN 78, creatinine 6.1, BNP 414, troponin 19, 14, chest x-ray noted cardiomegaly mild pulmonary edema and small pleural effusion  Assessment and Plan: * Acute on chronic diastolic CHF (congestive heart failure) (HCC) Echocardiogram with preserved LV systolic function with EF 55 to 60%, mild LVH, RV systolic function preserved, no significant valvular disease.   Continue furosemide 80 mg po daily for diuresis. Continue carvedilol.  Limited pharmacologic options due to reduced GFR and recovering AKI.   Acute kidney injury superimposed on chronic kidney disease (HCC) CKD stage 3b, Hyperkalemia.   Volume status has improved, renal function today with a serum cr of 4,11 with K at 4,1 and serum bicarbonate at 21. Na 137 and P 4.6  Plan to continue diuresis with furosemide 80 mg po daily. Will hold on oral bicarbonate, since acidosis has improved.  Follow up renal function in am.   Paroxysmal atrial fibrillation (HCC) Continue rate control with  carvedilol and anticoagulation with apixaban.   Leukocytosis Reactive leukocytosis, now resolved.   Macrocytic anemia Stable hgb   Diabetes mellitus type 2 in obese Continue insulin sliding sclae for glucose cover and monitoring.  Patient is tolerating po well.   Essential hypertension Continue blood pressure monitoring.   OSA (obstructive sleep apnea) Cpap as outpatient.   Class 3 obesity (HCC) Calculated BMI id 50.2         Subjective: Patient is feeling better, dyspnea and edema have improved, no chest pain, no PND or orthopnea.   Physical Exam: Vitals:   05/13/23 0059 05/13/23 0454 05/13/23 0637 05/13/23 0718  BP: (!) 153/82 (!) 119/48 (!) 162/73 (!) 167/87  Pulse: 86 79 86 97  Resp: 18 18  16   Temp: 98.5 F (36.9 C) 97.6 F (36.4 C)  97.6 F (36.4 C)  TempSrc: Oral Oral  Oral  SpO2: 96% 98%  91%  Weight:  (!) 159 kg    Height:       Neurology awake and alert ENT with mild pallor Cardiovascular with S1 and S2 present and regular with no gallops rubs or murmurs Respiratory with no rales or wheezing, no rhonchi Abdomen with no distention  Trace non pitting lower extremity edema, positive mild lymphedema  Data Reviewed:    Family Communication: no family at the bedside   Disposition: Status is: Inpatient Remains inpatient appropriate because: renal function monitoring   Planned Discharge Destination: Home    Author: Coralie Keens, MD 05/13/2023 2:18 PM  For on call review [email or web address].

## 2023-05-13 NOTE — Plan of Care (Signed)

## 2023-05-13 NOTE — Plan of Care (Signed)
  Problem: Activity: Goal: Capacity to carry out activities will improve Outcome: Progressing   Problem: Cardiac: Goal: Ability to achieve and maintain adequate cardiopulmonary perfusion will improve Outcome: Progressing   Problem: Health Behavior/Discharge Planning: Goal: Ability to manage health-related needs will improve Outcome: Progressing   Problem: Clinical Measurements: Goal: Ability to maintain clinical measurements within normal limits will improve Outcome: Progressing Goal: Cardiovascular complication will be avoided Outcome: Progressing   Problem: Activity: Goal: Risk for activity intolerance will decrease Outcome: Progressing   Problem: Elimination: Goal: Will not experience complications related to bowel motility Outcome: Progressing   Problem: Pain Managment: Goal: General experience of comfort will improve Outcome: Progressing   Problem: Safety: Goal: Ability to remain free from injury will improve Outcome: Progressing   Problem: Fluid Volume: Goal: Ability to maintain a balanced intake and output will improve Outcome: Progressing   Problem: Health Behavior/Discharge Planning: Goal: Ability to identify and utilize available resources and services will improve Outcome: Progressing Goal: Ability to manage health-related needs will improve Outcome: Progressing   Problem: Nutritional: Goal: Progress toward achieving an optimal weight will improve Outcome: Progressing

## 2023-05-14 ENCOUNTER — Telehealth: Payer: Self-pay

## 2023-05-14 ENCOUNTER — Other Ambulatory Visit (HOSPITAL_COMMUNITY): Payer: Self-pay

## 2023-05-14 DIAGNOSIS — D72829 Elevated white blood cell count, unspecified: Secondary | ICD-10-CM | POA: Diagnosis not present

## 2023-05-14 DIAGNOSIS — I480 Paroxysmal atrial fibrillation: Secondary | ICD-10-CM | POA: Diagnosis not present

## 2023-05-14 DIAGNOSIS — I5033 Acute on chronic diastolic (congestive) heart failure: Secondary | ICD-10-CM | POA: Diagnosis not present

## 2023-05-14 DIAGNOSIS — N179 Acute kidney failure, unspecified: Secondary | ICD-10-CM

## 2023-05-14 LAB — RENAL FUNCTION PANEL
Albumin: 2.9 g/dL — ABNORMAL LOW (ref 3.5–5.0)
Anion gap: 11 (ref 5–15)
BUN: 67 mg/dL — ABNORMAL HIGH (ref 8–23)
CO2: 20 mmol/L — ABNORMAL LOW (ref 22–32)
Calcium: 8.4 mg/dL — ABNORMAL LOW (ref 8.9–10.3)
Chloride: 108 mmol/L (ref 98–111)
Creatinine, Ser: 3.29 mg/dL — ABNORMAL HIGH (ref 0.61–1.24)
GFR, Estimated: 20 mL/min — ABNORMAL LOW (ref >60)
Glucose, Bld: 127 mg/dL — ABNORMAL HIGH (ref 70–99)
Phosphorus: 4 mg/dL (ref 2.5–4.6)
Potassium: 3.7 mmol/L (ref 3.5–5.1)
Sodium: 139 mmol/L (ref 135–145)

## 2023-05-14 LAB — GLUCOSE, CAPILLARY: Glucose-Capillary: 125 mg/dL — ABNORMAL HIGH (ref 70–99)

## 2023-05-14 MED ORDER — AMLODIPINE BESYLATE 10 MG PO TABS
10.0000 mg | ORAL_TABLET | Freq: Every day | ORAL | Status: DC
  Administered 2023-05-14: 10 mg via ORAL
  Filled 2023-05-14: qty 1

## 2023-05-14 MED ORDER — SODIUM BICARBONATE 650 MG PO TABS
650.0000 mg | ORAL_TABLET | Freq: Two times a day (BID) | ORAL | 0 refills | Status: AC
  Filled 2023-05-14: qty 42, 21d supply, fill #0

## 2023-05-14 NOTE — Progress Notes (Signed)
Patient ID: Gerald Day, male   DOB: 1956-08-02, 67 y.o.   MRN: [id number] [facility] Progress Note   Assessment/ Plan:   1. Acute kidney Injury on chronic kidney disease stage IIIb: With underlying/baseline biopsy-proven diabetic chronic kidney disease.  Acute injury suspected to be hemodynamically mediated in the setting of CHF exacerbation and preceding NSAID/ARB use with possible hypotension.  Transitioned to oral furosemide yesterday and maintains decent urine output with improvement of renal function/creatinine noted on labs this morning.  Appears stable enough to discharge with outpatient renal follow-up in 3 to 4 weeks (which I will set up). 2.  Hyperkalemia: On potassium supplements and ARB prior to hospitalization-now corrected with medical management.  Would hold on restarting ARB until renal function back to baseline (creatinine in the low 2s). 3.  Metabolic acidosis: This is mixed anion gap/non-anion gap from underlying chronic kidney disease and acute injury.  Recommend discharging home on 3 weeks of sodium bicarbonate 650 mg twice daily. 4.  Acute exacerbation of diastolic heart failure: Responding well to intravenous furosemide with excellent urine output noted overnight; improving renal function/volume status.  Remains hypervolemic and to continue outpatient diuretics. 5.  Hypertension: Blood pressure elevated with ongoing diuresis; increased carvedilol to 12.5 mg twice daily yesterday (his usual outpatient dose).  Subjective:   Denies any acute events overnight and breathing is back to baseline.   Objective:   BP (!) 153/82 (BP Location: Right Arm)   Pulse 74   Temp 97.7 F (36.5 C) (Oral)   Resp 16   Ht 6' (1.829 m)   Wt (!) 159.1 kg   SpO2 98%   BMI 47.57 kg/m   Intake/Output Summary (Last 24 hours) at 05/14/2023 0826 Last data filed at 05/14/2023 0506 Gross per 24 hour  Intake 240 ml  Output 1775 ml  Net -1535 ml   Weight change: -1.11  kg  Physical Exam: Gen: Sitting up comfortably on the side of his bed CVS: Pulse regular rhythm, normal rate, S1 and S2 normal Resp: Distant breath sounds bilaterally without audible rhonchi/rales Abd: Soft, obese, nontender, bowel sounds normal Ext: 1-2+ lower extremity edema with venous stasis changes  Imaging: No results found.  Labs: BMET Recent Labs  Lab 05/09/23 0922 05/09/23 1557 05/10/23 0317 05/11/23 0900 05/12/23 0242 05/13/23 0249 05/14/23 0326  NA 135  --  137 136 137 137 139  K 7.0* 6.4* 5.5* 4.2 3.7 4.1 3.7  CL 113*  --  116* 108 107 106 108  CO2 11*  --  13* 17* 18* 21* 20*  GLUCOSE 151*  --  131* 225* 127* 129* 127*  BUN 78*  --  73* 63* 66* 68* 67*  CREATININE 6.11*  --  5.43* 4.28* 3.99* 4.11* 3.29*  CALCIUM 8.5*  --  8.9 9.0 8.6* 8.6* 8.4*  PHOS  --   --  4.6 4.8* 4.8* 4.6 4.0   CBC Recent Labs  Lab 05/09/23 0922 05/10/23 0317 05/11/23 0900  WBC 10.9* 10.5 9.9  NEUTROABS 8.6*  --   --   HGB 10.0* 10.6* 10.8*  HCT 32.4* 32.3* 32.9*  MCV 102.2* 99.7 96.5  PLT 204 196 214    Medications:     allopurinol  100 mg Oral Daily   apixaban  5 mg Oral BID   atorvastatin  20 mg Oral Daily   carvedilol  12.5 mg Oral BID WC   furosemide  80 mg Oral Daily   insulin aspart  0-6 Units Subcutaneous TID  WC   sodium bicarbonate  650 mg Oral BID   sodium chloride flush  3 mL Intravenous Q12H    Zetta Bills, MD 05/14/2023, 8:26 AM

## 2023-05-14 NOTE — Evaluation (Signed)
Occupational Therapy Evaluation Patient Details Name: Gerald Day MRN: [id number] DOB: [birth year] Today's Date: 05/14/2023   History of Present Illness Patient presenting to the ED with SOB and weakness on 05/09/23. Found to be bradycardic and admitted with CHF exacerbation. PMH:  diabetes, hyperlipidemia, hypertension, chronic kidney disease, paroxysmal atrial fibrillation on Eliquis.   Clinical Impression   Prior to this admission, patient living alone, driving, and managing all his ADLs. Currently, patient is at his baseline for ADLs, and requiring contact guard assist for functional mobility. Education provided to patient with regard to managing his fluid intake and salt to prevent further hospital admissions, as well AE education for lower body dressing. Patient does not require any further OT services acutely or at discharge. OT signing off at this time. Please re-consult if further needs arise.       If plan is discharge home, recommend the following: Assist for transportation (initially)    Functional Status Assessment  Patient has had a recent decline in their functional status and demonstrates the ability to make significant improvements in function in a reasonable and predictable amount of time.  Equipment Recommendations  None recommended by OT (Patient has DME needed)    Recommendations for Other Services       Precautions / Restrictions Precautions Precautions: None Restrictions Weight Bearing Restrictions: No      Mobility Bed Mobility Overal bed mobility: Modified Independent                  Transfers Overall transfer level: Needs assistance   Transfers: Sit to/from Stand Sit to Stand: Contact guard assist           General transfer comment: minimal increased effort with sit<>stands, complaining of R knee pain, able to complete without assist      Balance Overall balance assessment: Mild deficits observed, not formally tested                                          ADL either performed or assessed with clinical judgement   ADL Overall ADL's : At baseline                                       General ADL Comments: Patient is at baseline for his ADL management, educated on long handled shoe horn and other AE for ease in socks and shoes     Vision Baseline Vision/History: 1 Wears glasses (Readers) Ability to See in Adequate Light: 0 Adequate Patient Visual Report: No change from baseline Vision Assessment?: Yes Eye Alignment: Within Functional Limits Ocular Range of Motion: Within Functional Limits Alignment/Gaze Preference: Within Defined Limits Tracking/Visual Pursuits: Able to track stimulus in all quads without difficulty Saccades: Within functional limits Convergence: Within functional limits     Perception Perception: Not tested       Praxis Praxis: Not tested       Pertinent Vitals/Pain Pain Assessment Pain Assessment: 0-10 Pain Score: 4  Pain Location: R knee Pain Descriptors / Indicators: Discomfort, Grimacing, Guarding Pain Intervention(s): Monitored during session, Limited activity within patient's tolerance, Patient requesting pain meds-RN notified     Extremity/Trunk Assessment Upper Extremity Assessment Upper Extremity Assessment: Overall WFL for tasks assessed   Lower Extremity Assessment Lower Extremity Assessment: Overall WFL for tasks assessed;Defer to PT evaluation  Cervical / Trunk Assessment Cervical / Trunk Assessment: Normal   Communication Communication Communication: No apparent difficulties   Cognition Arousal: Alert Behavior During Therapy: WFL for tasks assessed/performed Overall Cognitive Status: Within Functional Limits for tasks assessed                                 General Comments: Minimal pauses with answers, but cognitively appropriate throughout     General Comments  VSS    Exercises     Shoulder  Instructions      Home Living Family/patient expects to be discharged to:: Private residence Living Arrangements: Alone Available Help at Discharge: Family;Available 24 hours/day Type of Home: House Home Access: Stairs to enter;Ramped entrance (pt uses ramp) Entrance Stairs-Number of Steps: 4 Entrance Stairs-Rails: Can reach both Home Layout: One level     Bathroom Shower/Tub: Walk-in shower;Tub/shower unit   Bathroom Toilet: Handicapped height     Home Equipment: Agricultural consultant (2 wheels);Wheelchair - manual;Cane - single point;Grab bars - tub/shower;Shower seat - built in   Additional Comments: SP for community ambulation      Prior Functioning/Environment Prior Level of Function : Independent/Modified Independent;Driving             Mobility Comments: walks with his cane ADLs Comments: drives, manages his own medications, and IADLs        OT Problem List: Decreased activity tolerance      OT Treatment/Interventions:      OT Goals(Current goals can be found in the care plan section) Acute Rehab OT Goals Patient Stated Goal: to go home OT Goal Formulation: With patient Time For Goal Achievement: 05/28/23 Potential to Achieve Goals: Good  OT Frequency:      Co-evaluation              AM-PAC OT "6 Clicks" Daily Activity     Outcome Measure Help from another person eating meals?: None Help from another person taking care of personal grooming?: None Help from another person toileting, which includes using toliet, bedpan, or urinal?: None Help from another person bathing (including washing, rinsing, drying)?: None Help from another person to put on and taking off regular upper body clothing?: None Help from another person to put on and taking off regular lower body clothing?: None 6 Click Score: 24   End of Session Nurse Communication: Mobility status;Patient requests pain meds  Activity Tolerance: Patient tolerated treatment well Patient left: in  bed;with call bell/phone within reach (sitting EOB at end of session)  OT Visit Diagnosis: Muscle weakness (generalized) (M62.81);Pain Pain - Right/Left: Right Pain - part of body: Knee                Time: 1610-9604 OT Time Calculation (min): 14 min Charges:  OT General Charges $OT Visit: 1 Visit OT Evaluation $OT Eval Moderate Complexity: 1 Mod  Pollyann Glen E. Keymani Mclean, OTR/L Acute Rehabilitation Services [phone]   Cherlyn Cushing 05/14/2023, 8:46 AM

## 2023-05-14 NOTE — Progress Notes (Signed)
   05/13/23 2124  BiPAP/CPAP/SIPAP  BiPAP/CPAP/SIPAP Pt Type Adult  BiPAP/CPAP/SIPAP Resmed  Mask Type Nasal mask  FiO2 (%) 21 %  Patient Home Equipment Yes  Safety Check Completed by RT for Home Unit Yes, no issues noted

## 2023-05-14 NOTE — Evaluation (Signed)
Physical Therapy Evaluation Patient Details Name: Gerald Day MRN: [id number] DOB: 1955/12/19 Today's Date: 05/14/2023  History of Present Illness  Patient presenting to the ED with SOB and weakness on 05/09/23. Found to be bradycardic and admitted with CHF exacerbation. PMH:  diabetes, hyperlipidemia, hypertension, chronic kidney disease, paroxysmal atrial fibrillation on Eliquis.  Clinical Impression  Pt presents to therapy at functional mobility baseline. Pt was supervision for mobility with RW. Pt has no further questions and no therapy needs post discharge. Acute PT signing off.       If plan is discharge home, recommend the following: Help with stairs or ramp for entrance;A little help with walking and/or transfers   Can travel by private vehicle        Equipment Recommendations None recommended by PT (pt owns equipment)  Recommendations for Other Services       Functional Status Assessment Patient has had a recent decline in their functional status and demonstrates the ability to make significant improvements in function in a reasonable and predictable amount of time.     Precautions / Restrictions Precautions Precautions: None Restrictions Weight Bearing Restrictions: No      Mobility  Bed Mobility Overal bed mobility:  (nt, in chair upon arrival)                  Transfers Overall transfer level: Needs assistance Equipment used: Rolling walker (2 wheels) Transfers: Sit to/from Stand Sit to Stand: Supervision           General transfer comment: able to complete w/o assistance    Ambulation/Gait Ambulation/Gait assistance: Supervision Gait Distance (Feet): 70 Feet Assistive device: Rolling walker (2 wheels) Gait Pattern/deviations: Step-through pattern, Trunk flexed       General Gait Details: cues to keep RW close, steady gait with no LOB        Balance Overall balance assessment: No apparent balance deficits (not formally assessed)                                            Pertinent Vitals/Pain Pain Assessment Pain Assessment: Faces Faces Pain Scale: Hurts little more Pain Location: R knee Pain Descriptors / Indicators: Discomfort, Grimacing, Guarding Pain Intervention(s): Limited activity within patient's tolerance, Monitored during session, Repositioned, RN gave pain meds during session    Home Living Family/patient expects to be discharged to:: Private residence Living Arrangements: Alone Available Help at Discharge: Family;Available 24 hours/day Type of Home: House Home Access: Stairs to enter;Ramped entrance (pt uses ramp) Entrance Stairs-Rails: Can reach both Entrance Stairs-Number of Steps: 4   Home Layout: One level Home Equipment: Agricultural consultant (2 wheels);Wheelchair - manual;Cane - single point;Grab bars - tub/shower;Shower seat - built in Additional Comments: SP for community ambulation    Prior Function Prior Level of Function : Independent/Modified Independent;Driving             Mobility Comments: SP for community ambulation ADLs Comments: drives, manages his own medications, and IADLs     Extremity/Trunk Assessment   Upper Extremity Assessment Upper Extremity Assessment: Defer to OT evaluation    Lower Extremity Assessment Lower Extremity Assessment: Overall WFL for tasks assessed    Cervical / Trunk Assessment Cervical / Trunk Assessment: Normal  Communication   Communication Communication: No apparent difficulties  Cognition Arousal: Alert Behavior During Therapy: WFL for tasks assessed/performed Overall Cognitive Status: Within Functional Limits for  tasks assessed                    General Comments General comments (skin integrity, edema, etc.): VSS on room air    Exercises     Assessment/Plan    PT Assessment Patient does not need any further PT services  PT Problem List          AM-PAC PT "6 Clicks" Mobility  Outcome Measure Help  needed turning from your back to your side while in a flat bed without using bedrails?: None Help needed moving from lying on your back to sitting on the side of a flat bed without using bedrails?: None Help needed moving to and from a bed to a chair (including a wheelchair)?: A Little Help needed standing up from a chair using your arms (e.g., wheelchair or bedside chair)?: A Little Help needed to walk in hospital room?: A Little Help needed climbing 3-5 steps with a railing? : A Little 6 Click Score: 20    End of Session   Activity Tolerance: Patient tolerated treatment well Patient left: in chair;with call bell/phone within reach (no chair alarm upon arrival) Nurse Communication: Mobility status PT Visit Diagnosis: Difficulty in walking, not elsewhere classified (R26.2)    Time: 1610-9604 PT Time Calculation (min) (ACUTE ONLY): 13 min   Charges:   PT Evaluation $PT Eval Low Complexity: 1 Low   PT General Charges $$ ACUTE PT VISIT: 1 Visit         [name], PT, DPT Secure Chat Preferred  Rehab Office [phone]   [name] 05/14/2023, 9:43 AM

## 2023-05-14 NOTE — Progress Notes (Signed)
Heart Failure Navigator Progress Note  Assessed for Heart & Vascular TOC clinic readiness.  Patient will follow up with renal as outpatient. No HF TOC per MD.   Sharen Hones, PharmD, BCPS Heart Failure Stewardship Pharmacist Phone [phone]

## 2023-05-14 NOTE — Progress Notes (Signed)
Explained discharge instructions to patient. Reviewed follow up appointment and next medication administration times. Also reviewed education. Patient verbalized having an understanding for instructions given. All belongings are in the patient's possession to include his personal CPAP machice. Will pick up his TOC meds in route to the dc lounge to await his ride home. His IV and telemetry were removed by his nurse prior to my assisting with his discharge. No other needs verbalized. Transporting downstairs to the discharge lounge.

## 2023-05-14 NOTE — Discharge Summary (Signed)
Physician Discharge Summary   Patient: Gerald Day MRN: [id number] DOB: Jun 03, 1956  Admit date:     05/09/2023  Discharge date: 05/14/23  Discharge Physician: York Ram Harlee Pursifull   PCP: Corwin Levins, MD   Recommendations at discharge:    Patient will hold on telmisartan and Kcl due to hyperkalemia and recovering acute renal failure.  Hold on diltiazem due to bradycardia.  Continue carvedilol. Resume diuresis with furosemide 80 mg daily.   Discharge Diagnoses: Principal Problem:   Acute on chronic diastolic CHF (congestive heart failure) (HCC) Active Problems:   Acute kidney injury superimposed on chronic kidney disease (HCC)   Paroxysmal atrial fibrillation (HCC)   Leukocytosis   Macrocytic anemia   Diabetes mellitus type 2 in obese   Essential hypertension   OSA (obstructive sleep apnea)   Class 3 obesity (HCC)  Resolved Problems:   * No resolved hospital problems. [facility], The Course: Mr. Gerald Day was admitted to the hospital with the working diagnosis of heart failure exacerbation.   67/M with history of hypertension, dyslipidemia, diastolic CHF, paroxysmal A-fib, CKD 3B, OSA, obesity class 3, presented to the ED with progressive shortness of breath, orthopnea and lower extremity edema. Because persistent symptoms EMS was called and he was found bradycardic 44 bpm. In the ED his blood pressure was 113/84, HR 44 to 67, temp 98.1 and 02 saturation 100%, lungs with rales and decreased breath sounds, heart with S1 and S2 present and irregular, abdomen with no distention, positive lower extremity edema ++.  EMS gave 200 cc IV fluids, atropine and transported to the ED.   Na 135, K 7.0. cl 113, bicarbonate 11, glucose 151, bun 78, cr 6.11.  BNP 414 High sensitive troponin 19 and 24  Wbc 10, hgb 10, plt 204  Covid 19 negative   Chest radiograph with cardiomegaly, bilateral hilar vascular congestion, bilateral small pleural effusions.   EKG 53 bpm, right axis deviation,  normal intervals, atrial fibrillation rhythm, q waves V1 and V2, no significant ST segment or T wave changes.   Patient had aggressive medical therapy for hyperkalemia along with diuresis with furosemide.  He did not require hemodialysis.   His renal function has been improving. Now transitioned to oral furosemide.  Will need close follow up as outpatient.    Assessment and Plan: * Acute on chronic diastolic CHF (congestive heart failure) (HCC) Echocardiogram with preserved LV systolic function with EF 55 to 60%, mild LVH, RV systolic function preserved, no significant valvular disease.   Patient was placed on IV furosemide, negative fluid balance was achieved, - 11,297 ml, with significant improvement in his symptoms.  His weight went down by about 10 Kg.   Continue carvedilol, will continue to hold on ARB for now.  Continue with loop diuretic furosemide 80 mg daily. Limited pharmacologic options due to reduced GFR and recovering AKI.   Acute kidney injury superimposed on chronic kidney disease (HCC) CKD stage 3b, Hyperkalemia.   Volume status has improved.  At the time of his discharge his renal function had a serum cr of 3,29 with K at 3,7 and serum 20.  Na 139 P 4,0   Plan to continue diuresis with furosemide 80 mg po daily. Follow up renal function as outpatient.  Hold on ARB and K supplementation.   Paroxysmal atrial fibrillation (HCC) Continue rate control with carvedilol and anticoagulation with apixaban.   Leukocytosis Reactive leukocytosis, now resolved.   Macrocytic anemia Stable hgb   Diabetes mellitus type 2 in  obese He was placed on insulin sliding sclae for glucose cover and monitoring.  His glucose has remained stable, at the time of his discharge his fasting glucose was 127 mg/dl.   Essential hypertension Continue carvedilol for blood pressure control. Hold on telmisartan.  Follow up as outpatient.   OSA (obstructive sleep apnea) Cpap as outpatient.    Class 3 obesity (HCC) Calculated BMI id 50.2          Consultants: nephrology  Procedures performed: none   Disposition: Home Diet recommendation:  Cardiac and Carb modified diet DISCHARGE MEDICATION: Allergies as of 05/14/2023       Reactions   Penicillins Other (See Comments)   Unknown childhood allergic reaction Has patient had a PCN reaction causing immediate rash, facial/tongue/throat swelling, SOB or lightheadedness with hypotension: Unknown Has patient had a PCN reaction causing severe rash involving mucus membranes or skin necrosis: Unknown Has patient had a PCN reaction that required hospitalization: No Has patient had a PCN reaction occurring within the last 10 years: No If all of the above answers are "NO", then may proceed with Cephalosporin use.   Tizanidine Other (See Comments)   07/26/20: Pt does not recognize drug and does not remember being allergic to it.        Medication List     STOP taking these medications    diltiazem 180 MG 24 hr capsule Commonly known as: CARDIZEM CD   metFORMIN 500 MG tablet Commonly known as: GLUCOPHAGE   potassium chloride SA 20 MEQ tablet Commonly known as: KLOR-CON M   telmisartan 80 MG tablet Commonly known as: MICARDIS       TAKE these medications    allopurinol 100 MG tablet Commonly known as: ZYLOPRIM Take 1 tablet (100 mg total) by mouth daily.   amLODipine 10 MG tablet Commonly known as: NORVASC Take 1 tablet (10 mg total) by mouth daily.   apixaban 5 MG Tabs tablet Commonly known as: Eliquis Take 1 tablet (5 mg total) by mouth 2 (two) times daily.   atorvastatin 20 MG tablet Commonly known as: Lipitor Take 1 tablet (20 mg total) by mouth daily.   carvedilol 12.5 MG tablet Commonly known as: COREG Take 1 tablet (12.5 mg total) by mouth 2 (two) times daily with a meal.   cloNIDine 0.2 MG tablet Commonly known as: CATAPRES Take 1 tablet (0.2 mg total) by mouth 2 (two) times daily.    cyanocobalamin 1000 MCG tablet Commonly known as: VITAMIN B12 Take 1 tablet (1,000 mcg total) by mouth daily.   furosemide 80 MG tablet Commonly known as: LASIX Take 1 tablet (80 mg total) by mouth daily.   sodium bicarbonate 650 MG tablet Take 1 tablet (650 mg total) by mouth 2 (two) times daily for 21 days.   Thera-D 2000 50 MCG (2000 UT) Tabs Generic drug: Cholecalciferol 1 tab by mouth once daily        Discharge Exam: Filed Weights   05/12/23 0800 05/13/23 0454 05/14/23 0502  Weight: (!) 160.2 kg (!) 159 kg (!) 159.1 kg   BP (!) 153/82 (BP Location: Right Arm)   Pulse 74   Temp 97.7 F (36.5 C) (Oral)   Resp 16   Ht 6' (1.829 m)   Wt (!) 159.1 kg   SpO2 98%   BMI 47.57 kg/m   Patient is feeling better with no dyspnea or chest pain, his edema has improved, no PND or orthopnea.   Neurology awake and alert ENT with mild pallor  Cardiovascular with S1 and S2 present, irregular with no gallops, rubs or murmurs No JVD Trace non pitting lower extremity edema (some lymphedema) Respiratory with no rales or wheezing Abdomen with no distention   Condition at discharge: stable  The results of significant diagnostics from this hospitalization (including imaging, microbiology, ancillary and laboratory) are listed below for reference.   Imaging Studies: ECHOCARDIOGRAM COMPLETE  Result Date: 05/10/2023    ECHOCARDIOGRAM REPORT   Patient Name:   ZEUS PROPP Date of Exam: 05/10/2023 Medical Rec #:  [id number]        Height:       72.0 in Accession #:    [id number]       Weight:       363.5 lb Date of Birth:  [birth year]        BSA:          2.746 m Patient Age:    67 years         BP:           134/52 mmHg Patient Gender: M                HR:           90 bpm. Exam Location:  Inpatient Procedure: 2D Echo, Cardiac Doppler and Color Doppler Indications:    CHF-Acute Diastolic I50.31  History:        Patient has prior history of Echocardiogram examinations, most                  recent 07/29/2020. CHF, Arrythmias:Atrial Fibrillation and                 Bradycardia; Risk Factors:Diabetes, Sleep Apnea, Hypertension,                 Dyslipidemia and Non-Smoker.  Sonographer:    Aron Baba Referring Phys: Clydie Braun  Sonographer Comments: Patient is obese, suboptimal subcostal window and no apical window. Image acquisition challenging due to respiratory motion. IMPRESSIONS  1. Left ventricular ejection fraction, by estimation, is 55 to 60%. The left ventricle has normal function. The left ventricle has no regional wall motion abnormalities. There is mild concentric left ventricular hypertrophy. Left ventricular diastolic parameters are consistent with Grade I diastolic dysfunction (impaired relaxation).  2. Right ventricular systolic function is normal. The right ventricular size is normal. Tricuspid regurgitation signal is inadequate for assessing PA pressure.  3. The mitral valve is grossly normal. Trivial mitral valve regurgitation.  4. The aortic valve is tricuspid. Aortic valve regurgitation is not visualized.  5. Aortic dilatation noted. There is mild dilatation of the ascending aorta, measuring 42 mm.  6. The inferior vena cava is normal in size with greater than 50% respiratory variability, suggesting right atrial pressure of 3 mmHg. Comparison(s): Prior images reviewed side by side. LVEF normal range at 55-60%. Mildly dilated ascending aorta at 42 mm. FINDINGS  Left Ventricle: Left ventricular ejection fraction, by estimation, is 55 to 60%. The left ventricle has normal function. The left ventricle has no regional wall motion abnormalities. The left ventricular internal cavity size was normal in size. There is  mild concentric left ventricular hypertrophy. Left ventricular diastolic parameters are consistent with Grade I diastolic dysfunction (impaired relaxation). Right Ventricle: The right ventricular size is normal. No increase in right ventricular wall thickness. Right  ventricular systolic function is normal. Tricuspid regurgitation signal is inadequate for assessing PA pressure. The tricuspid regurgitant velocity is 1.17 m/s, and with an assumed  right atrial pressure of 3 mmHg, the estimated right ventricular systolic pressure is 8.5 mmHg. Left Atrium: Left atrial size was normal in size. Right Atrium: Right atrial size was normal in size. Pericardium: There is no evidence of pericardial effusion. Mitral Valve: The mitral valve is grossly normal. Trivial mitral valve regurgitation. Tricuspid Valve: The tricuspid valve is grossly normal. Tricuspid valve regurgitation is mild. Aortic Valve: The aortic valve is tricuspid. Aortic valve regurgitation is not visualized. Pulmonic Valve: The pulmonic valve was grossly normal. Pulmonic valve regurgitation is trivial. Aorta: Aortic dilatation noted. There is mild dilatation of the ascending aorta, measuring 42 mm. Venous: The inferior vena cava is normal in size with greater than 50% respiratory variability, suggesting right atrial pressure of 3 mmHg. IAS/Shunts: No atrial level shunt detected by color flow Doppler.  LEFT VENTRICLE PLAX 2D LVIDd:         5.30 cm Diastology LVIDs:         3.90 cm LV e' medial:    4.57 cm/s LV PW:         1.50 cm LV E/e' medial:  17.7 LV IVS:        1.20 cm LV e' lateral:   5.33 cm/s                        LV E/e' lateral: 15.2  RIGHT VENTRICLE RV S prime:     12.20 cm/s TAPSE (M-mode): 1.2 cm LEFT ATRIUM           Index        RIGHT ATRIUM           Index LA diam:      4.70 cm 1.71 cm/m   RA Area:     16.50 cm LA Vol (A4C): 55.5 ml 20.21 ml/m  RA Volume:   41.20 ml  15.00 ml/m  AORTIC VALVE LVOT Vmax:   78.10 cm/s LVOT Vmean:  49.300 cm/s LVOT VTI:    0.117 m  AORTA Ao Asc diam: 4.20 cm MITRAL VALVE                TRICUSPID VALVE MV Area (PHT): 3.39 cm     TR Peak grad:   5.5 mmHg MV Decel Time: 224 msec     TR Vmax:        117.00 cm/s MR Peak grad: 6.4 mmHg MR Vmax:      126.00 cm/s   SHUNTS MV E  velocity: 80.90 cm/s   Systemic VTI: 0.12 m MV A velocity: 101.00 cm/s MV E/A ratio:  0.80 Nona Dell MD Electronically signed by Nona Dell MD Signature Date/Time: 05/10/2023/3:45:37 PM    Final    US RENAL  Result Date: 05/09/2023 CLINICAL DATA:  Acute kidney injury. EXAM: RENAL / URINARY TRACT ULTRASOUND COMPLETE COMPARISON:  09/21/2017 FINDINGS: Right Kidney: Renal measurements: 11.5 x 5.6 x 5.8 = volume: 195 mL. Echogenicity within normal limits. No mass or hydronephrosis visualized. Left Kidney: Renal measurements: 11.0 x 6.1 x 6.5 cm = volume: 226 mL. Echogenicity within normal limits. No mass or hydronephrosis visualized. Bladder: Appears normal for degree of bladder distention. Other: None. IMPRESSION: Mild cortical thinning bilaterally.  No hydronephrosis. Electronically Signed   By: Kennith Center M.D.   On: 05/09/2023 16:43   DG Chest 2 View  Result Date: 05/09/2023 CLINICAL DATA:  Increasing shortness of breath over the last 3 days. Generalized weakness. Chest pain. EXAM: CHEST - 2 VIEW COMPARISON:  Radiographs  07/29/2020, 07/26/2020 and 12/22/2004. FINDINGS: The heart is enlarged. There is vascular congestion with mildly increased interstitial and septal thickening, suspicious for mild pulmonary edema. There is no confluent airspace opacity or pneumothorax. Possible small pleural effusions on the lateral view. Mild degenerative changes in the spine without evidence of acute osseous abnormality. IMPRESSION: Cardiomegaly with mild pulmonary edema and possible small pleural effusions. Findings suggest mild congestive heart failure. Electronically Signed   By: Carey Bullocks M.D.   On: 05/09/2023 10:22    Microbiology: Results for orders placed or performed during the hospital encounter of 05/09/23  SARS Coronavirus 2 by RT PCR (hospital order, performed in [facility] hospital lab) *cepheid single result test* Anterior Nasal Swab     Status: None   Collection Time: 05/09/23 10:03 AM    Specimen: Anterior Nasal Swab  Result Value Ref Range Status   SARS Coronavirus 2 by RT PCR NEGATIVE NEGATIVE Final    Comment: Performed at [facility], [address], [place], [place] [address]    Labs: CBC: Recent Labs  Lab 05/09/23 [id number] 05/10/23 0317 05/11/23 0900  WBC 10.9* 10.5 9.9  NEUTROABS 8.6*  --   --   HGB 10.0* 10.6* 10.8*  HCT 32.4* 32.3* 32.9*  MCV 102.2* 99.7 96.5  PLT 204 196 214   Basic Metabolic Panel: Recent Labs  Lab 05/10/23 0317 05/11/23 0900 05/12/23 0242 05/13/23 0249 05/14/23 0326  NA 137 136 137 137 139  K 5.5* 4.2 3.7 4.1 3.7  CL 116* 108 107 106 108  CO2 13* 17* 18* 21* 20*  GLUCOSE 131* 225* 127* 129* 127*  BUN 73* 63* 66* 68* 67*  CREATININE 5.43* 4.28* 3.99* 4.11* 3.29*  CALCIUM 8.9 9.0 8.6* 8.6* 8.4*  PHOS 4.6 4.8* 4.8* 4.6 4.0   Liver Function Tests: Recent Labs  Lab 05/10/23 0317 05/11/23 0900 05/12/23 0242 05/13/23 0249 05/14/23 0326  ALBUMIN 3.5 3.2* 2.9* 2.9* 2.9*   CBG: Recent Labs  Lab 05/13/23 0559 05/13/23 1116 05/13/23 1605 05/13/23 2103 05/14/23 0555  GLUCAP 127* 171* 153* 161* 125*    Discharge time spent: greater than 30 minutes.  Signed: Coralie Keens, MD [facility] 05/14/2023

## 2023-05-14 NOTE — Plan of Care (Signed)

## 2023-05-14 NOTE — Consult Note (Signed)
   [facility]  [facility] Inpatient Consult   05/14/2023  Gerald Day 07-Apr-1956 [id number]  [facility] [[facility]]  Accounta ble Care Organization [ACO] Patient:  [name] Medicare   Primary Care Provider: Corwin Levins, MD with [facility]  The patient was screened for risk score for post hospital follow up needs with HF with Diabetes, AKI with Chronic Kidney Disease  The patient was assessed for potential [facility]) Care Management service needs for post hospital transition for care coordination. Review of patient's electronic medical record reveals patient is admitted with HF and with Stage IIIB Kidney dz.    Plan: [facility] Liaison will continue to follow progress and disposition to asess for post hospital community care coordination/management needs.  Referral request for community care coordination: anticipate [facility] Transitions of Care Team follow up. Will request follow up with Care Coordination team for follow up Kidney Disease.   [facility] Care Management/Population Health does not replace or interfere with any arrangements made by the Inpatient Transition of Care team.   For questions contact:   Charlesetta Shanks, RN, BSN, CCM [place]  [facility], [facility] Liaison Direct Dial: [phone] or secure chat Website: [name]

## 2023-05-17 ENCOUNTER — Telehealth: Payer: Self-pay

## 2023-05-17 NOTE — Transitions of Care (Post Inpatient/ED Visit) (Signed)
   05/17/2023  Name: Gerald Day MRN: [id number] DOB: 1956-01-26  Today's TOC FU Call Status: Today's TOC FU Call Status:: Unsuccessful Call (1st Attempt) Unsuccessful Call (1st Attempt) Date: 05/17/23  Attempted to reach the patient regarding the most recent Inpatient/ED visit.  Follow Up Plan: Additional outreach attempts will be made to reach the patient to complete the Transitions of Care (Post Inpatient/ED visit) call.    Antionette Fairy, RN,BSN,CCM [facility] Care Management Care Management Community Coordinator Direct Phone: [phone] Toll Free: [phone] Fax: [phone]

## 2023-05-18 ENCOUNTER — Telehealth: Payer: Self-pay

## 2023-05-18 NOTE — Transitions of Care (Post Inpatient/ED Visit) (Signed)
05/18/2023  Name: Gerald Day MRN: [id number] DOB: 06/01/1956  Today's TOC FU Call Status: Today's TOC FU Call Status:: Successful TOC FU Call Completed TOC FU Call Complete Date: 05/18/23 Patient's Name and Date of Birth confirmed.  Transition Care Management Follow-up Telephone Call Date of Discharge: 05/14/23 Discharge Facility: [facility]) Type of Discharge: Inpatient Admission Primary Inpatient Discharge Diagnosis:: "acute pulmonary edema" How have you been since you were released from the hospital?: Better (Pt pleased to voice how well he is doing -has had no further breathing issues or other sxs.He is eating & sleeping well. BP yest was 150/70. Denies any swelling.) Any questions or concerns?: No  Items Reviewed: Did you receive and understand the discharge instructions provided?: Yes Medications obtained,verified, and reconciled?: Yes (Medications Reviewed) Any new allergies since your discharge?: No Dietary orders reviewed?: Yes Type of Diet Ordered:: low salt/heart healthy Do you have support at home?: Yes People in Home: alone Name of Support/Comfort Primary Source: sons live nearby-able to assist as needed  Medications Reviewed Today: Medications Reviewed Today     Reviewed by Charlyn Minerva, RN (Registered Nurse) on 05/18/23 at 1042  Med List Status: <None>   Medication Order Taking? Sig Documenting Provider Last Dose Status Informant  allopurinol (ZYLOPRIM) 100 MG tablet [id number] Yes Take 1 tablet (100 mg total) by mouth daily. Corwin Levins, MD Taking Active Self  amLODipine (NORVASC) 10 MG tablet [id number] Yes Take 1 tablet (10 mg total) by mouth daily. Corwin Levins, MD Taking Active Self  apixaban (ELIQUIS) 5 MG TABS tablet [id number] Yes Take 1 tablet (5 mg total) by mouth 2 (two) times daily. Corwin Levins, MD Taking Active Self  atorvastatin (LIPITOR) 20 MG tablet [id number] Yes Take 1 tablet (20 mg total) by mouth daily. Corwin Levins,  MD Taking Active Self  carvedilol (COREG) 12.5 MG tablet [id number] Yes Take 1 tablet (12.5 mg total) by mouth 2 (two) times daily with a meal. Corwin Levins, MD Taking Active Self  Cholecalciferol (THERA-D 2000) 50 MCG (2000 UT) TABS [id number] Yes 1 tab by mouth once daily Corwin Levins, MD Taking Active Self  cloNIDine (CATAPRES) 0.2 MG tablet [id number] Yes Take 1 tablet (0.2 mg total) by mouth 2 (two) times daily. Corwin Levins, MD Taking Active Self  furosemide (LASIX) 80 MG tablet [id number] Yes Take 1 tablet (80 mg total) by mouth daily. Corwin Levins, MD Taking Active Self  sodium bicarbonate 650 MG tablet [id number] Yes Take 1 tablet (650 mg total) by mouth 2 (two) times daily for 21 days. Arrien, York Ram, MD Taking Active   vitamin B-12 (CYANOCOBALAMIN) 1000 MCG tablet [id number] Yes Take 1 tablet (1,000 mcg total) by mouth daily. Corwin Levins, MD Taking Active Self            Home Care and Equipment/Supplies: Were Home Health Services Ordered?: NA Any new equipment or medical supplies ordered?: NA  Functional Questionnaire: Do you need assistance with bathing/showering or dressing?: No Do you need assistance with meal preparation?: No Do you need assistance with eating?: No Do you have difficulty maintaining continence: No Do you need assistance with getting out of bed/getting out of a chair/moving?: No Do you have difficulty managing or taking your medications?: No  Follow up appointments reviewed: PCP Follow-up appointment confirmed?: Yes Date of PCP follow-up appointment?: 05/25/23 Follow-up Provider: Dr. Laray Anger Follow-up appointment confirmed?: NA Do you need transportation to your follow-up appointment?: No Do  you understand care options if your condition(s) worsen?: Yes-patient verbalized understanding  SDOH Interventions Today    Flowsheet Row Most Recent Value  SDOH Interventions   Food Insecurity Interventions Intervention Not Indicated   Transportation Interventions Intervention Not Indicated      .thtno Interventions Today    Flowsheet Row Most Recent Value  Chronic Disease   Chronic disease during today's visit Hypertension (HTN), Congestive Heart Failure (CHF)  General Interventions   General Interventions Discussed/Reviewed General Interventions Discussed, Doctor Visits, Referral to Nurse, Durable Medical Equipment (DME)  [pt agreeable to RN follow up for ongoing support & education-follow up call scheduled with assigned nurse]  Doctor Visits Discussed/Reviewed PCP, Specialist, Doctor Visits Discussed  Durable Medical Equipment (DME) BP Cuff, Other  [scale-wgt monitoring]  PCP/Specialist Visits Compliance with follow-up visit  Education Interventions   Education Provided Provided Education  Provided Verbal Education On Nutrition, When to see the doctor  Nutrition Interventions   Nutrition Discussed/Reviewed Adding fruits and vegetables, Decreasing salt, Decreasing sugar intake, Increasing proteins, Decreasing fats, Fluid intake, Nutrition Discussed, Portion sizes  Pharmacy Interventions   Pharmacy Dicussed/Reviewed Pharmacy Topics Discussed, Medications and their functions  Safety Interventions   Safety Discussed/Reviewed Safety Discussed       Alessandra Grout [facility] Care Management Care Management Community Coordinator Direct Phone: [phone] Toll Free: [phone] Fax: [phone]

## 2023-05-25 ENCOUNTER — Ambulatory Visit: Payer: Medicare HMO | Admitting: Internal Medicine

## 2023-05-25 ENCOUNTER — Encounter: Payer: Self-pay | Admitting: Internal Medicine

## 2023-05-25 VITALS — BP 130/82 | HR 88 | Temp 98.4°F | Ht 72.0 in | Wt 359.0 lb

## 2023-05-25 DIAGNOSIS — N1831 Chronic kidney disease, stage 3a: Secondary | ICD-10-CM

## 2023-05-25 DIAGNOSIS — E1169 Type 2 diabetes mellitus with other specified complication: Secondary | ICD-10-CM | POA: Diagnosis not present

## 2023-05-25 DIAGNOSIS — E559 Vitamin D deficiency, unspecified: Secondary | ICD-10-CM

## 2023-05-25 DIAGNOSIS — E669 Obesity, unspecified: Secondary | ICD-10-CM

## 2023-05-25 DIAGNOSIS — Z7985 Long-term (current) use of injectable non-insulin antidiabetic drugs: Secondary | ICD-10-CM

## 2023-05-25 DIAGNOSIS — I5033 Acute on chronic diastolic (congestive) heart failure: Secondary | ICD-10-CM

## 2023-05-25 DIAGNOSIS — I10 Essential (primary) hypertension: Secondary | ICD-10-CM | POA: Diagnosis not present

## 2023-05-25 MED ORDER — FUROSEMIDE 80 MG PO TABS: ORAL_TABLET | ORAL | 11 refills | Status: DC

## 2023-05-25 MED ORDER — TIRZEPATIDE 5 MG/0.5ML ~~LOC~~ SOAJ: 5.0000 mg | SUBCUTANEOUS | 3 refills | Status: DC

## 2023-05-25 NOTE — Assessment & Plan Note (Signed)
With 5 lbs increase post d/c - for incresaed lasix 80 qam and 40 qpm, f/u renal with lab in 1 wk as planned

## 2023-05-25 NOTE — Assessment & Plan Note (Signed)
Lab Results  Component Value Date   CREATININE 3.29 (H) 05/14/2023   Stable overall, cont to avoid nephrotoxins

## 2023-05-25 NOTE — Assessment & Plan Note (Signed)
Lab Results  Component Value Date   VITAMINB12 874 05/11/2023   Stable, cont oral replacement - b12 1000 mcg qd

## 2023-05-25 NOTE — Patient Instructions (Addendum)
Ok to increase the lasix to 80 mg in the am, and 40 mg in the PM  Ok to increase the Mounjaro to 5 mg weekly  Please continue all other medications as before, and refills have been done if requested.  Please have the pharmacy call with any other refills you may need.  Please continue your efforts at being more active, low cholesterol diet, and weight control.  Please keep your appointments with your specialists as you may have planned - renal in 1 wk  Please make an Appointment to return in Nov 6, or sooner if needed

## 2023-05-25 NOTE — Assessment & Plan Note (Signed)
Lab Results  Component Value Date   HGBA1C 6.8 (H) 01/06/2023   Uncontrolled, pt to increase mounjaro 5 mg weekly for better sugar and wt control

## 2023-05-25 NOTE — Assessment & Plan Note (Signed)
Last vitamin D Lab Results  Component Value Date   VD25OH 16.43 (L) 01/06/2023   Low, to start oral replacement

## 2023-05-25 NOTE — Progress Notes (Signed)
Patient ID: Gerald Day, male   DOB: 02-05-1956, 67 y.o.   MRN: [id number]        Chief Complaint: follow up post hospn sept 1 - 6 2024       HPI:  Gerald Day is a 67 y.o. male here with above with hyperkalemia, AKI on telmisartan, bradycarida, with holding telmisartan, KCL, diltiazem, metformin, and to continue cardizem and resumed lasix 80 every day.  Pt denies chest pain, increased sob or doe, wheezing, orthopnea, PND, increased LE swelling, palpitations, dizziness or syncope, but has gained several lbs on current med regimen with lasix 80 qam.  Used to take 80 bid at one point.  Sugars have been mild increased and unable to lose fat wt as well.    Transitional Care Management elements noted today: 1)  Date of D/C: as above 2)  Medication reconciliation:  done today at end visit 3)  Review of D/C summary or other information:  done today 4)  Review of need for f/u on pending diagnostic tests and treatments:  done today - none 5)  Review of need for Interaction with other providers who will assume or resume care of pt specific problems: done today- for renal f/u in 1 wk 6)  Education of patient/family/guardian or caregiver: none needed        Wt Readings from Last 3 Encounters:  05/25/23 (!) 359 lb (162.8 kg)  05/14/23 (!) 350 lb 12 oz (159.1 kg)  02/26/23 (!) 375 lb (170.1 kg)   BP Readings from Last 3 Encounters:  05/25/23 130/82  05/14/23 (!) 153/82  01/13/23 132/84         Past Medical History:  Diagnosis Date   Arthritis    IN KNEES   Cellulitis 10/26/2013   LOWER RT EXTREMITY   CELLULITIS/ABSCESS, LEG 06/27/2007   COLONIC POLYPS    CONGESTIVE HEART FAILURE    DIABETES MELLITUS, TYPE II    DIVERTICULOSIS, COLON    ERECTILE DYSFUNCTION, ORGANIC    GOUT NOS    Headache    HYPERLIPIDEMIA    HYPERTENSION    LOW BACK PAIN    Morbid obesity (HCC)    SLEEP APNEA, OBSTRUCTIVE    USES CPAP   Past Surgical History:  Procedure Laterality Date   APPENDECTOMY      CARDIOVERSION N/A 08/02/2020   Procedure: CARDIOVERSION;  Surgeon: Thurmon Fair, MD;  Location: [facility] ENDOSCOPY;  Service: Cardiovascular;  Laterality: N/A;   FOOT SURGERY     LT   TEE WITHOUT CARDIOVERSION N/A 08/02/2020   Procedure: TRANSESOPHAGEAL ECHOCARDIOGRAM (TEE);  Surgeon: Thurmon Fair, MD;  Location: [facility] ENDOSCOPY;  Service: Cardiovascular;  Laterality: N/A;   TENOTOMY / FLEXOR TENDON TRANSFER Right 03/15/2015   Procedure: TENOTOMY HT REPAIR/ULCER DEBRIDEMENT/GRAFT PREP/ACELL GRAFT APPLICATION;  Surgeon: Sherin Quarry, DPM;  Location: [facility] OR;  Service: Podiatry;  Laterality: Right;  HALLUX    reports that he has never smoked. He has never used smokeless tobacco. He reports that he does not currently use alcohol after a past usage of about 3.0 - 4.0 standard drinks of alcohol per week. He reports that he does not use drugs. family history includes Asthma in an other family member; Cardiomyopathy in his father. Allergies  Allergen Reactions   Penicillins Other (See Comments)    Unknown childhood allergic reaction Has patient had a PCN reaction causing immediate rash, facial/tongue/throat swelling, SOB or lightheadedness with hypotension: Unknown Has patient had a PCN reaction causing severe rash involving mucus membranes  or skin necrosis: Unknown Has patient had a PCN reaction that required hospitalization: No Has patient had a PCN reaction occurring within the last 10 years: No If all of the above answers are "NO", then may proceed with Cephalosporin use.    Tizanidine Other (See Comments)    07/26/20: Pt does not recognize drug and does not remember being allergic to it.   Current Outpatient Medications on File Prior to Visit  Medication Sig Dispense Refill   allopurinol (ZYLOPRIM) 100 MG tablet Take 1 tablet (100 mg total) by mouth daily. 90 tablet 3   amLODipine (NORVASC) 10 MG tablet Take 1 tablet (10 mg total) by mouth daily. 90 tablet 3   apixaban (ELIQUIS) 5 MG TABS  tablet Take 1 tablet (5 mg total) by mouth 2 (two) times daily. 180 tablet 3   atorvastatin (LIPITOR) 20 MG tablet Take 1 tablet (20 mg total) by mouth daily. 90 tablet 3   carvedilol (COREG) 12.5 MG tablet Take 1 tablet (12.5 mg total) by mouth 2 (two) times daily with a meal. 180 tablet 3   Cholecalciferol (THERA-D 2000) 50 MCG (2000 UT) TABS 1 tab by mouth once daily 90 tablet 99   cloNIDine (CATAPRES) 0.2 MG tablet Take 1 tablet (0.2 mg total) by mouth 2 (two) times daily. 180 tablet 3   sodium bicarbonate 650 MG tablet Take 1 tablet (650 mg total) by mouth 2 (two) times daily for 21 days. 42 tablet 0   vitamin B-12 (CYANOCOBALAMIN) 1000 MCG tablet Take 1 tablet (1,000 mcg total) by mouth daily. 90 tablet 3   No current facility-administered medications on file prior to visit.        ROS:  All others reviewed and negative.  Objective        PE:  BP 130/82 (BP Location: Right Arm, Patient Position: Sitting, Cuff Size: Normal)   Pulse 88   Temp 98.4 F (36.9 C) (Oral)   Ht 6' (1.829 m)   Wt (!) 359 lb (162.8 kg)   SpO2 98%   BMI 48.69 kg/m                 Constitutional: Pt appears in NAD               HENT: Head: NCAT.                Right Ear: External ear normal.                 Left Ear: External ear normal.                Eyes: . Pupils are equal, round, and reactive to light. Conjunctivae and EOM are normal               Nose: without d/c or deformity               Neck: Neck supple. Gross normal ROM               Cardiovascular: Normal rate and regular rhythm.                 Pulmonary/Chest: Effort normal and breath sounds without rales or wheezing.                Abd:  Soft, NT, ND, + BS, no organomegaly               Neurological: Pt is alert. At baseline orientation, motor grossly intact  Skin: Skin is warm. No rashes, no other new lesions, LE edema - 1+ chronic (improved over 2-3+ chronic previously)               Psychiatric: Pt behavior is normal  without agitation   Micro: none  Cardiac tracings I have personally interpreted today:  none  Pertinent Radiological findings (summarize): none   Lab Results  Component Value Date   WBC 9.9 05/11/2023   HGB 10.8 (L) 05/11/2023   HCT 32.9 (L) 05/11/2023   PLT 214 05/11/2023   GLUCOSE 127 (H) 05/14/2023   CHOL 153 01/06/2023   TRIG 124.0 01/06/2023   HDL 43.70 01/06/2023   LDLDIRECT 83.0 12/19/2018   LDLCALC 84 01/06/2023   ALT 15 01/06/2023   AST 13 01/06/2023   NA 139 05/14/2023   K 3.7 05/14/2023   CL 108 05/14/2023   CREATININE 3.29 (H) 05/14/2023   BUN 67 (H) 05/14/2023   CO2 20 (L) 05/14/2023   TSH 1.88 01/06/2023   PSA 0.83 01/06/2023   INR 1.4 (H) 07/26/2020   HGBA1C 6.8 (H) 01/06/2023   MICROALBUR 95.3 (H) 01/06/2023   Assessment/Plan:  Gerald Day is a 67 y.o. White or Caucasian [1] male with  has a past medical history of Arthritis, Cellulitis (10/26/2013), CELLULITIS/ABSCESS, LEG (06/27/2007), COLONIC POLYPS, CONGESTIVE HEART FAILURE, DIABETES MELLITUS, TYPE II, DIVERTICULOSIS, COLON, ERECTILE DYSFUNCTION, ORGANIC, GOUT NOS, Headache, HYPERLIPIDEMIA, HYPERTENSION, LOW BACK PAIN, Morbid obesity (HCC), and SLEEP APNEA, OBSTRUCTIVE.  Vitamin D deficiency Last vitamin D Lab Results  Component Value Date   VD25OH 16.43 (L) 01/06/2023   Low, to start oral replacement   B12 deficiency Lab Results  Component Value Date   VITAMINB12 874 05/11/2023   Stable, cont oral replacement - b12 1000 mcg qd   Diabetes mellitus type 2 in obese Lab Results  Component Value Date   HGBA1C 6.8 (H) 01/06/2023   Uncontrolled, pt to increase mounjaro 5 mg weekly for better sugar and wt control   Essential hypertension BP Readings from Last 3 Encounters:  05/25/23 130/82  05/14/23 (!) 153/82  01/13/23 132/84   Stable, pt to continue medical treatment norvasc 10 every day, coreg 12.5 bid, catapres 0.2 bid   Stage 3a chronic kidney disease (HCC) Lab Results   Component Value Date   CREATININE 3.29 (H) 05/14/2023   Stable overall, cont to avoid nephrotoxins   Acute on chronic diastolic CHF (congestive heart failure) (HCC) With 5 lbs increase post d/c - for incresaed lasix 80 qam and 40 qpm, f/u renal with lab in 1 wk as planned  Followup: Return in about 7 weeks (around 07/14/2023).  Oliver Barre, MD 05/25/2023 1:00 PM [facility]

## 2023-05-25 NOTE — Assessment & Plan Note (Signed)
BP Readings from Last 3 Encounters:  05/25/23 130/82  05/14/23 (!) 153/82  01/13/23 132/84   Stable, pt to continue medical treatment norvasc 10 every day, coreg 12.5 bid, catapres 0.2 bid

## 2023-05-31 ENCOUNTER — Ambulatory Visit: Payer: Self-pay

## 2023-05-31 NOTE — Patient Outreach (Signed)
Care Coordination   Initial Visit Note   05/31/2023 Name: Gerald Day MRN: [id number] DOB: 31-Aug-1956  Gerald Day is a 67 y.o. year old male who sees Corwin Levins, MD for primary care. I spoke with  [name] by phone today.  What matters to the patients health and wellness today?  [name] reports he is feeling better since discharge. He reports he continues daily weights. He denies any edema or signs/symptoms of HF exacerbation.  He reports he is not going to take [facility] due to cost-declines referral to clinical pharmacist.   Goals Addressed             This Visit's Progress    continue to improve post hospitalization       Interventions Today    Flowsheet Row Most Recent Value  Chronic Disease   Chronic disease during today's visit Hypertension (HTN), Congestive Heart Failure (CHF), Chronic Kidney Disease/End Stage Renal Disease (ESRD), Diabetes  General Interventions   General Interventions Discussed/Reviewed General Interventions Discussed, Communication with  Doctor Visits Discussed/Reviewed Doctor Visits Discussed, PCP, Specialist  PCP/Specialist Visits Compliance with follow-up visit  [reviewed upcoming scheduled appointments]  Communication with PCP/Specialists  [update patient not taking Mounjaro and renal appointment schedule.]  Exercise Interventions   Exercise Discussed/Reviewed Exercise Discussed  [discussed importance of exercise in controlling diabetes]  Education Interventions   Provided Verbal Education On When to see the doctor, Medication, Blood Sugar Monitoring, Exercise  [reviewed signs/symptoms of HF exacerbation. advised to take medications as prescribed,  attend provider appointments as schedueld empahsized importance of renal follow up and lab. discussed daily weights]  Pharmacy Interventions   Pharmacy Dicussed/Reviewed Pharmacy Topics Discussed            SDOH assessments and interventions completed:  Yes recently completed.  Patient denies any changes.   Care Coordination Interventions:  Yes, provided   Follow up plan: Follow up call scheduled for 06/11/23    Encounter Outcome:  Patient Visit Completed   Kathyrn Sheriff, RN, MSN, BSN, CCM Care Management Coordinator [phone]

## 2023-05-31 NOTE — Patient Instructions (Signed)
Visit Information  Thank you for taking time to visit with me today. Please don't hesitate to contact me if I can be of assistance to you.   Following are the goals we discussed today:  Continue to take medications as prescribed. Continue to attend provider visits as scheduled Continue to eat healthy, lean meats, vegetables, fruits, avoid saturated and transfats Continue daily weights and monitoring for signs/symptoms of worsening condition. Notify provider if questions or concerns.   Our next appointment is by telephone on 06/11/23 at 9:30 am  Please call the care guide team at [phone] if you need to cancel or reschedule your appointment.   If you are experiencing a Mental Health or Behavioral Health Crisis or need someone to talk to, please call the Suicide and Crisis Lifeline: 988 call the [place] National Suicide Prevention Lifeline: (562)777-6887 or TTY: 719-267-0149 TTY 9085596669) to talk to a trained counselor call 1-800-273-TALK (toll free, 24 hour hotline)  Kathyrn Sheriff, RN, MSN, BSN, CCM Care Management Coordinator [phone]

## 2023-06-07 DIAGNOSIS — N1832 Chronic kidney disease, stage 3b: Secondary | ICD-10-CM | POA: Diagnosis not present

## 2023-06-07 DIAGNOSIS — N189 Chronic kidney disease, unspecified: Secondary | ICD-10-CM | POA: Diagnosis not present

## 2023-06-07 DIAGNOSIS — R809 Proteinuria, unspecified: Secondary | ICD-10-CM | POA: Diagnosis not present

## 2023-06-07 DIAGNOSIS — N179 Acute kidney failure, unspecified: Secondary | ICD-10-CM | POA: Diagnosis not present

## 2023-06-07 DIAGNOSIS — D631 Anemia in chronic kidney disease: Secondary | ICD-10-CM | POA: Diagnosis not present

## 2023-06-07 DIAGNOSIS — I129 Hypertensive chronic kidney disease with stage 1 through stage 4 chronic kidney disease, or unspecified chronic kidney disease: Secondary | ICD-10-CM | POA: Diagnosis not present

## 2023-06-07 DIAGNOSIS — M109 Gout, unspecified: Secondary | ICD-10-CM | POA: Diagnosis not present

## 2023-06-07 DIAGNOSIS — E785 Hyperlipidemia, unspecified: Secondary | ICD-10-CM | POA: Diagnosis not present

## 2023-06-07 DIAGNOSIS — N2581 Secondary hyperparathyroidism of renal origin: Secondary | ICD-10-CM | POA: Diagnosis not present

## 2023-06-07 DIAGNOSIS — I509 Heart failure, unspecified: Secondary | ICD-10-CM | POA: Diagnosis not present

## 2023-06-08 LAB — LAB REPORT - SCANNED
Creatinine, POC: 55.4 mg/dL
EGFR: 31

## 2023-06-09 ENCOUNTER — Other Ambulatory Visit (HOSPITAL_COMMUNITY): Payer: Self-pay

## 2023-06-11 ENCOUNTER — Ambulatory Visit: Payer: Self-pay

## 2023-06-11 NOTE — Patient Instructions (Addendum)
Visit Information  Thank you for taking time to visit with me today. Please don't hesitate to contact me if I can be of assistance to you.   Following are the goals we discussed today:  Continue to take medications as prescribed. Continue to attend provider visits as scheduled Continue to eat healthy, lean meats, vegetables, fruits, avoid saturated and transfats Discuss with provider any health questions or concerns   Our next appointment is by telephone on 07/21/23 at 9:30 am  Please call the care guide team at [phone] if you need to cancel or reschedule your appointment.   If you are experiencing a Mental Health or Behavioral Health Crisis or need someone to talk to, please call the Suicide and Crisis Lifeline: 988 call the [place] National Suicide Prevention Lifeline: 763-325-6732 or TTY: 787-858-8135 TTY 631 821 5471) to talk to a trained counselor call 1-800-273-TALK (toll free, 24 hour hotline)  Kathyrn Sheriff, RN, MSN, BSN, CCM Care Management Coordinator [phone]

## 2023-06-11 NOTE — Patient Outreach (Signed)
Care Coordination   Follow Up Visit Note   06/11/2023 Name: RYLEE TOOLE MRN: [id number] DOB: 21-Dec-1955  CARLESS FABRIS is a 67 y.o. year old male who sees Corwin Levins, MD for primary care. I spoke with  [name] by phone today.  What matters to the patients health and wellness today?  Mr. Castrogiovanni reports he is doing well. He denies any signs/symptoms of heart failure exacerbation. He reports he has all his medications and is taking without any difficulty, but is not taking Mounjaro and declines clinical pharmacy referral for any type of assistance. Patient is not checking his blood sugars; states he does not know where his meter is. Currently he is not taking any medication for diabetes. Patient expressed he will discuss with Primary provider at next office visit-scheduled for 07/14/23.  A1C 6.8 on 01/06/23. He decline educational material at this time. He is without questions or concerns.   Goals Addressed             This Visit's Progress    continue to improve post hospitalization       Interventions Today    Flowsheet Row Most Recent Value  Chronic Disease   Chronic disease during today's visit Hypertension (HTN), Congestive Heart Failure (CHF), Atrial Fibrillation (AFib), Chronic Kidney Disease/End Stage Renal Disease (ESRD), Diabetes  General Interventions   General Interventions Discussed/Reviewed General Interventions Reviewed, Doctor Visits, Communication with  [Evaluation of current treatment plan for health conditions and patients adherence to plan.]  Doctor Visits Discussed/Reviewed PCP  PCP/Specialist Visits Compliance with follow-up visit  [reviewed upcoming provider visits. confirmed patient has transportation]  Communication with PCP/Specialists  [re: patient management of diabetes]  Exercise Interventions   Exercise Discussed/Reviewed Physical Activity  Physical Activity Discussed/Reviewed Physical Activity Discussed  Education Interventions   Education  Provided Provided Education  [discussed importance of monitoring Blood sugar and how diabaetes can affect cardiac health. advised to obtain a glucose meter and discuss with provider at upcoming visit.]  Provided Verbal Education On When to see the doctor, Medication, Other, Blood Sugar Monitoring, Labs  [reviewed signs/symptoms of HF exacerbation, encouraged to continue to weight daily, monitor blood pressure and take to provider office at next visit. advised to contact provider with heatlh questions/concerns as needed.]  Labs Reviewed Hgb A1c  [A1C 6.8 on 01/06/23 down from 7.0 on 07/15/22]  Nutrition Interventions   Nutrition Discussed/Reviewed Nutrition Discussed  [encouraged to eat healthy, avoid concentrated sweets, monitor carbohydrate intake]  Pharmacy Interventions   Pharmacy Dicussed/Reviewed Pharmacy Topics Reviewed            SDOH assessments and interventions completed:  No  Care Coordination Interventions:  Yes, provided   Follow up plan: Follow up call scheduled for 07/21/23    Encounter Outcome:  Patient Visit Completed   Kathyrn Sheriff, RN, MSN, BSN, CCM Care Management Coordinator [phone]

## 2023-07-07 ENCOUNTER — Other Ambulatory Visit: Payer: Medicare HMO

## 2023-07-07 ENCOUNTER — Other Ambulatory Visit (INDEPENDENT_AMBULATORY_CARE_PROVIDER_SITE_OTHER): Payer: Medicare HMO

## 2023-07-07 DIAGNOSIS — E669 Obesity, unspecified: Secondary | ICD-10-CM | POA: Diagnosis not present

## 2023-07-07 DIAGNOSIS — E1169 Type 2 diabetes mellitus with other specified complication: Secondary | ICD-10-CM | POA: Diagnosis not present

## 2023-07-07 DIAGNOSIS — E559 Vitamin D deficiency, unspecified: Secondary | ICD-10-CM

## 2023-07-07 DIAGNOSIS — E538 Deficiency of other specified B group vitamins: Secondary | ICD-10-CM

## 2023-07-07 LAB — BASIC METABOLIC PANEL
BUN: 69 mg/dL — ABNORMAL HIGH (ref 6–23)
CO2: 26 meq/L (ref 19–32)
Calcium: 9.4 mg/dL (ref 8.4–10.5)
Chloride: 106 meq/L (ref 96–112)
Creatinine, Ser: 2.78 mg/dL — ABNORMAL HIGH (ref 0.40–1.50)
GFR: 22.67 mL/min — ABNORMAL LOW (ref >60.00)
Glucose, Bld: 200 mg/dL — ABNORMAL HIGH (ref 70–99)
Potassium: 4.9 meq/L (ref 3.5–5.1)
Sodium: 140 meq/L (ref 135–145)

## 2023-07-07 LAB — VITAMIN D 25 HYDROXY (VIT D DEFICIENCY, FRACTURES): VITD: 25.31 ng/mL — ABNORMAL LOW (ref 30.00–100.00)

## 2023-07-07 LAB — HEPATIC FUNCTION PANEL
ALT: 23 U/L (ref 0–53)
AST: 14 U/L (ref 0–37)
Albumin: 3.8 g/dL (ref 3.5–5.2)
Alkaline Phosphatase: 99 U/L (ref 39–117)
Bilirubin, Direct: 0 mg/dL (ref 0.0–0.3)
Total Bilirubin: 0.4 mg/dL (ref 0.2–1.2)
Total Protein: 7.5 g/dL (ref 6.0–8.3)

## 2023-07-07 LAB — LIPID PANEL
Cholesterol: 111 mg/dL (ref 0–200)
HDL: 36.3 mg/dL — ABNORMAL LOW (ref >39.00)
LDL Cholesterol: 47 mg/dL (ref 0–99)
NonHDL: 74.86
Total CHOL/HDL Ratio: 3
Triglycerides: 137 mg/dL (ref 0.0–149.0)
VLDL: 27.4 mg/dL (ref 0.0–40.0)

## 2023-07-07 LAB — HEMOGLOBIN A1C: Hgb A1c MFr Bld: 7.5 % — ABNORMAL HIGH (ref 4.6–6.5)

## 2023-07-07 LAB — VITAMIN B12: Vitamin B-12: 908 pg/mL (ref 211–911)

## 2023-07-14 ENCOUNTER — Ambulatory Visit: Payer: Medicare HMO | Admitting: Internal Medicine

## 2023-07-14 ENCOUNTER — Encounter: Payer: Self-pay | Admitting: Internal Medicine

## 2023-07-14 VITALS — BP 140/80 | HR 72 | Temp 98.1°F | Ht 72.0 in | Wt 359.0 lb

## 2023-07-14 DIAGNOSIS — E7800 Pure hypercholesterolemia, unspecified: Secondary | ICD-10-CM | POA: Diagnosis not present

## 2023-07-14 DIAGNOSIS — N1831 Chronic kidney disease, stage 3a: Secondary | ICD-10-CM

## 2023-07-14 DIAGNOSIS — Z23 Encounter for immunization: Secondary | ICD-10-CM

## 2023-07-14 DIAGNOSIS — I10 Essential (primary) hypertension: Secondary | ICD-10-CM

## 2023-07-14 DIAGNOSIS — E669 Obesity, unspecified: Secondary | ICD-10-CM | POA: Diagnosis not present

## 2023-07-14 DIAGNOSIS — E538 Deficiency of other specified B group vitamins: Secondary | ICD-10-CM | POA: Diagnosis not present

## 2023-07-14 DIAGNOSIS — E1169 Type 2 diabetes mellitus with other specified complication: Secondary | ICD-10-CM

## 2023-07-14 DIAGNOSIS — E559 Vitamin D deficiency, unspecified: Secondary | ICD-10-CM

## 2023-07-14 DIAGNOSIS — Z6841 Body Mass Index (BMI) 40.0 and over, adult: Secondary | ICD-10-CM

## 2023-07-14 NOTE — Patient Instructions (Signed)
You had the flu shot today  Please consider having the RSV shot at the pharmacy since you are around the grandkids regularly  Please continue all other medications as before, and refills have been done if requested.  Please have the pharmacy call with any other refills you may need.  Please continue your efforts at being more active, low cholesterol diet, and weight control.  Please keep your appointments with your specialists as you may have planned -renal in 6 months  We can hold on lab testing today  Please make an Appointment to return in 6 months, or sooner if needed

## 2023-07-14 NOTE — Progress Notes (Unsigned)
Patient ID: Gerald Day, male   DOB: Dec 18, 1955, 67 y.o.   MRN: [id number]        Chief Complaint: follow up htn, dm, low vit d, hld, ckd3a       HPI:  Gerald Day is a 67 y.o. male here overall doing ok,  Pt denies chest pain, increased sob or doe, wheezing, orthopnea, PND, increased LE swelling, palpitations, dizziness or syncope.   Pt denies polydipsia, polyuria, or new focal neuro s/s.    Pt denies fever, wt loss, night sweats, loss of appetite, or other constitutional symptoms   Due for flu shot today  BP has been controlled at home.  May be willling to restart mounjaro but too expensive for  now in donut hole. Wt stable Wt Readings from Last 3 Encounters:  07/14/23 (!) 359 lb (162.8 kg)  05/25/23 (!) 359 lb (162.8 kg)  05/14/23 (!) 350 lb 12 oz (159.1 kg)   BP Readings from Last 3 Encounters:  07/14/23 (!) 140/80  05/25/23 130/82  05/14/23 (!) 153/82         Past Medical History:  Diagnosis Date   Arthritis    IN KNEES   Cellulitis 10/26/2013   LOWER RT EXTREMITY   CELLULITIS/ABSCESS, LEG 06/27/2007   COLONIC POLYPS    CONGESTIVE HEART FAILURE    DIABETES MELLITUS, TYPE II    DIVERTICULOSIS, COLON    ERECTILE DYSFUNCTION, ORGANIC    GOUT NOS    Headache    HYPERLIPIDEMIA    HYPERTENSION    LOW BACK PAIN    Morbid obesity (HCC)    SLEEP APNEA, OBSTRUCTIVE    USES CPAP   Past Surgical History:  Procedure Laterality Date   APPENDECTOMY     CARDIOVERSION N/A 08/02/2020   Procedure: CARDIOVERSION;  Surgeon: Thurmon Fair, MD;  Location: [facility] ENDOSCOPY;  Service: Cardiovascular;  Laterality: N/A;   FOOT SURGERY     LT   TEE WITHOUT CARDIOVERSION N/A 08/02/2020   Procedure: TRANSESOPHAGEAL ECHOCARDIOGRAM (TEE);  Surgeon: Thurmon Fair, MD;  Location: [facility] ENDOS[facility];  Service: Cardiovascular;  Laterality: N/A;   TENOTOMY / FLEXOR TENDON TRANSFER Right 03/15/2015   Procedure: TENOTOMY HT REPAIR/ULCER DEBRIDEMENT/GRAFT PREP/ACELL GRAFT APPLICATION;  Surgeon: Sherin Quarry, DPM;  Location: [facility] OR;  Service: Podiatry;  Laterality: Right;  HALLUX    reports that he has never smoked. He has never used smokeless tobacco. He reports that he does not currently use alcohol after a past usage of about 3.0 - 4.0 standard drinks of alcohol per week. He reports that he does not use drugs. family history includes Asthma in an other family member; Cardiomyopathy in his father. Allergies  Allergen Reactions   Penicillins Other (See Comments)    Unknown childhood allergic reaction Has patient had a PCN reaction causing immediate rash, facial/tongue/throat swelling, SOB or lightheadedness with hypotension: Unknown Has patient had a PCN reaction causing severe rash involving mucus membranes or skin necrosis: Unknown Has patient had a PCN reaction that required hospitalization: No Has patient had a PCN reaction occurring within the last 10 years: No If all of the above answers are "NO", then may proceed with Cephalosporin use.    Tizanidine Other (See Comments)    07/26/20: Pt does not recognize drug and does not remember being allergic to it.   Current Outpatient Medications on File Prior to Visit  Medication Sig Dispense Refill   allopurinol (ZYLOPRIM) 100 MG tablet Take 1 tablet (100 mg total) by mouth daily.  90 tablet 3   amLODipine (NORVASC) 10 MG tablet Take 1 tablet (10 mg total) by mouth daily. 90 tablet 3   apixaban (ELIQUIS) 5 MG TABS tablet Take 1 tablet (5 mg total) by mouth 2 (two) times daily. 180 tablet 3   atorvastatin (LIPITOR) 20 MG tablet Take 1 tablet (20 mg total) by mouth daily. 90 tablet 3   carvedilol (COREG) 12.5 MG tablet Take 1 tablet (12.5 mg total) by mouth 2 (two) times daily with a meal. 180 tablet 3   Cholecalciferol (THERA-D 2000) 50 MCG (2000 UT) TABS 1 tab by mouth once daily 90 tablet 99   cloNIDine (CATAPRES) 0.2 MG tablet Take 1 tablet (0.2 mg total) by mouth 2 (two) times daily. 180 tablet 3   furosemide (LASIX) 80 MG tablet 1 tab  by mouth in the AM, and 1/2 in the PM 45 tablet 11   tirzepatide (MOUNJARO) 5 MG/0.5ML Pen Inject 5 mg into the skin once a week. 6 mL 3   vitamin B-12 (CYANOCOBALAMIN) 1000 MCG tablet Take 1 tablet (1,000 mcg total) by mouth daily. 90 tablet 3   No current facility-administered medications on file prior to visit.        ROS:  All others reviewed and negative.  Objective        PE:  BP (!) 140/80 (BP Location: Right Arm, Patient Position: Sitting, Cuff Size: Normal)   Pulse 72   Temp 98.1 F (36.7 C) (Oral)   Ht 6' (1.829 m)   Wt (!) 359 lb (162.8 kg)   SpO2 98%   BMI 48.69 kg/m                 Constitutional: Pt appears in NAD               HENT: Head: NCAT.                Right Ear: External ear normal.                 Left Ear: External ear normal.                Eyes: . Pupils are equal, round, and reactive to light. Conjunctivae and EOM are normal               Nose: without d/c or deformity               Neck: Neck supple. Gross normal ROM               Cardiovascular: Normal rate and regular rhythm.                 Pulmonary/Chest: Effort normal and breath sounds without rales or wheezing.                Abd:  Soft, NT, ND, + BS, no organomegaly               Neurological: Pt is alert. At baseline orientation, motor grossly intact               Skin: Skin is warm. No rashes, no other new lesions, LE edema - trace bilateral               Psychiatric: Pt behavior is normal without agitation   Micro: none  Cardiac tracings I have personally interpreted today:  none  Pertinent Radiological findings (summarize): none   Lab Results  Component Value Date   WBC 9.9  05/11/2023   HGB 10.8 (L) 05/11/2023   HCT 32.9 (L) 05/11/2023   PLT 214 05/11/2023   GLUCOSE 200 (H) 07/07/2023   CHOL 111 07/07/2023   TRIG 137.0 07/07/2023   HDL 36.30 (L) 07/07/2023   LDLDIRECT 83.0 12/19/2018   LDLCALC 47 07/07/2023   ALT 23 07/07/2023   AST 14 07/07/2023   NA 140 07/07/2023   K  4.9 07/07/2023   CL 106 07/07/2023   CREATININE 2.78 (H) 07/07/2023   BUN 69 (H) 07/07/2023   CO2 26 07/07/2023   TSH 1.88 01/06/2023   PSA 0.83 01/06/2023   INR 1.4 (H) 07/26/2020   HGBA1C 7.5 (H) 07/07/2023   MICROALBUR 95.3 (H) 01/06/2023   Assessment/Plan:  Gerald Day is a 67 y.o. White or Caucasian [1] male with  has a past medical history of Arthritis, Cellulitis (10/26/2013), CELLULITIS/ABSCESS, LEG (06/27/2007), COLONIC POLYPS, CONGESTIVE HEART FAILURE, DIABETES MELLITUS, TYPE II, DIVERTICULOSIS, COLON, ERECTILE DYSFUNCTION, ORGANIC, GOUT NOS, Headache, HYPERLIPIDEMIA, HYPERTENSION, LOW BACK PAIN, Morbid obesity (HCC), and SLEEP APNEA, OBSTRUCTIVE.  Vitamin D deficiency Last vitamin D Lab Results  Component Value Date   VD25OH 25.31 (L) 07/07/2023   Low, to start oral replacement   Stage 3a chronic kidney disease (HCC) Lab Results  Component Value Date   CREATININE 2.78 (H) 07/07/2023   Stable overall, cont to avoid nephrotoxins   Hyperlipidemia Lab Results  Component Value Date   LDLCALC 47 07/07/2023   Stable, pt to continue current statin lipitor 20 qd   Essential hypertension BP Readings from Last 3 Encounters:  07/14/23 (!) 140/80  05/25/23 130/82  05/14/23 (!) 153/82   Mild uncontrolled pt to continue medical treatment norvasc 10 every day, coreg 12.5 bid, clonidine 0.2 bid, delcines other change for now   Diabetes mellitus type 2 in obese Lab Results  Component Value Date   HGBA1C 7.5 (H) 07/07/2023   Uncontroled, pt plans to restart monajro 5 mg in jan 2025 when affordable   B12 deficiency Lab Results  Component Value Date   VITAMINB12 908 07/07/2023   Stable, cont oral replacement - b12 1000 mcg qd  Followup: Return in about 6 months (around 01/11/2024).  Oliver Barre, MD 07/15/2023 7:38 PM [facility]

## 2023-07-15 ENCOUNTER — Encounter: Payer: Self-pay | Admitting: Internal Medicine

## 2023-07-15 NOTE — Assessment & Plan Note (Signed)
Lab Results  Component Value Date   VITAMINB12 908 07/07/2023   Stable, cont oral replacement - b12 1000 mcg qd

## 2023-07-15 NOTE — Assessment & Plan Note (Signed)
Lab Results  Component Value Date   CREATININE 2.78 (H) 07/07/2023   Stable overall, cont to avoid nephrotoxins

## 2023-07-15 NOTE — Assessment & Plan Note (Signed)
Lab Results  Component Value Date   LDLCALC 47 07/07/2023   Stable, pt to continue current statin lipitor 20 qd

## 2023-07-15 NOTE — Assessment & Plan Note (Signed)
Lab Results  Component Value Date   HGBA1C 7.5 (H) 07/07/2023   Uncontroled, pt plans to restart monajro 5 mg in jan 2025 when affordable

## 2023-07-15 NOTE — Assessment & Plan Note (Signed)
BP Readings from Last 3 Encounters:  07/14/23 (!) 140/80  05/25/23 130/82  05/14/23 (!) 153/82   Mild uncontrolled pt to continue medical treatment norvasc 10 every day, coreg 12.5 bid, clonidine 0.2 bid, delcines other change for now

## 2023-07-15 NOTE — Assessment & Plan Note (Signed)
Last vitamin D Lab Results  Component Value Date   VD25OH 25.31 (L) 07/07/2023   Low, to start oral replacement

## 2023-07-21 ENCOUNTER — Ambulatory Visit: Payer: Self-pay

## 2023-07-21 NOTE — Patient Instructions (Signed)
Visit Information  Thank you for taking time to visit with me today. Please don't hesitate to contact me if I can be of assistance to you.   Following are the goals we discussed today:  Continue to take medications as prescribed. Continue to attend provider visits as scheduled Continue to eat healthy, lean meats, vegetables, fruits, avoid saturated and transfats Contact provider with health questions or concerns as needed Continue to check blood pressure routinely and contact provider if questions or concerns  Our next appointment is by telephone on 08/19/23 at 9:30 am  Please call the care guide team at [phone] if you need to cancel or reschedule your appointment.   If you are experiencing a Mental Health or Behavioral Health Crisis or need someone to talk to, please call the Suicide and Crisis Lifeline: 988 call the [place] National Suicide Prevention Lifeline: 337-516-1326 or TTY: [phone] TTY 873-493-9192) to talk to a trained counselor call 1-800-273-TALK (toll free, 24 hour hotline)  Kathyrn Sheriff, RN, MSN, BSN, CCM Care Management Coordinator [phone]

## 2023-07-21 NOTE — Patient Outreach (Signed)
Care Coordination   Follow Up Visit Note   07/21/2023 Name: Gerald Day MRN: [id number] DOB: 20-Feb-1956  Gerald Day is a 67 y.o. year old male who sees Gerald Levins, MD for primary care. I spoke with  Gerald Day by phone today.  What matters to the patients health and wellness today? Gerald Day reports he is doing well. He reports attended Primary provider office visit.  A1C 7.5 on 07/07/23 increased from 6.8 on 01/06/23. Gerald Day reports the last Blood pressure checked was 125/80. He reports an upcoming nephrology appointment the end of December. Denies any questions or concerns at this time.   Goals Addressed             This Visit's Progress    COMPLETED: continue to improve post hospitalization       Interventions Today    Flowsheet Row Most Recent Value  Chronic Disease   Chronic disease during today's visit Hypertension (HTN), Congestive Heart Failure (CHF), Atrial Fibrillation (AFib), Chronic Kidney Disease/End Stage Renal Disease (ESRD), Diabetes  General Interventions   General Interventions Discussed/Reviewed General Interventions Reviewed, Doctor Visits, Communication with  [Evaluation of current treatment plan for health conditions and patients adherence to plan.]  Doctor Visits Discussed/Reviewed PCP  PCP/Specialist Visits Compliance with follow-up visit  [reviewed upcoming provider visits. confirmed patient has transportation]  Communication with PCP/Specialists  [re: patient management of diabetes]  Exercise Interventions   Exercise Discussed/Reviewed Physical Activity  Physical Activity Discussed/Reviewed Physical Activity Discussed  Education Interventions   Education Provided Provided Education  [discussed importance of monitoring Blood sugar and how diabaetes can affect cardiac health. advised to obtain a glucose meter and discuss with provider at upcoming visit.]  Provided Verbal Education On When to see the doctor, Medication, Other, Blood  Sugar Monitoring, Labs  [reviewed signs/symptoms of HF exacerbation, encouraged to continue to weight daily, monitor blood pressure and take to provider office at next visit. advised to contact provider with heatlh questions/concerns as needed.]  Labs Reviewed Hgb A1c  [A1C 6.8 on 01/06/23 down from 7.0 on 07/15/22]  Nutrition Interventions   Nutrition Discussed/Reviewed Nutrition Discussed  [encouraged to eat healthy, avoid concentrated sweets, monitor carbohydrate intake]  Pharmacy Interventions   Pharmacy Dicussed/Reviewed Pharmacy Topics Reviewed           Health Management       Interventions Today    Flowsheet Row Most Recent Value  Chronic Disease   Chronic disease during today's visit Diabetes, Hypertension (HTN), Congestive Heart Failure (CHF)  General Interventions   General Interventions Discussed/Reviewed General Interventions Reviewed, Durable Medical Equipment (DME)  [Evaluation of current treatment plan for health condition and patient's adherence to plan.]  Durable Medical Equipment (DME) BP Cuff  [encouraged patient to reach out to [employer] regarding OTC benefit to obtain scales]  Education Interventions   Education Provided Provided Education  [advised to continue to take medications as prescribed, attend provider visits as recommended, contact provier with health questions or concerns as needed. reviewed patient instructions per PCP office visit 07/14/23.]  Provided Verbal Education On Medication, Nutrition, When to see the doctor, Exercise, Labs, [employer]  [reinforced importance of daily weights, exercise/activity in management of health conditions. reviewed signs/symptoms of HF exacerbation. encouraged patient to obtain scales and begin to weigh daily]  Labs Reviewed Hgb A1c  [reviewed A1C 7.5 (increased)]  Nutrition Interventions   Nutrition Discussed/Reviewed Nutrition Reviewed  Pharmacy Interventions   Pharmacy Dicussed/Reviewed Pharmacy  Topics Discussed  SDOH assessments and interventions completed:  No  Care Coordination Interventions:  Yes, provided   Follow up plan: Follow up call scheduled for 08/19/23    Encounter Outcome:  Patient Visit Completed

## 2023-08-19 ENCOUNTER — Ambulatory Visit: Payer: Self-pay

## 2023-08-19 NOTE — Patient Outreach (Signed)
  Care Coordination   Follow Up Visit Note   08/19/2023 Name: Gerald Day MRN: [id number] DOB: 16-Feb-1956  Gerald Day is a 67 y.o. year old male who sees Corwin Levins, MD for primary care. I spoke with  [name] by phone today.  What matters to the patients health and wellness today?  Mr. Pezzullo reports he is doing well. He denies any signs/symptoms of HF exacerbation. Patient states he is not going to check blood sugar and will instead follow up with primary provider for routine A1C checks. He also states he does not want to start taking Mounjaro. Encouraged to discuss with  providers. Mr. Angon is receptive to educational articles about diabetes today.  Goals Addressed             This Visit's Progress    Health Management       Interventions Today    Flowsheet Row Most Recent Value  Chronic Disease   Chronic disease during today's visit Diabetes, Congestive Heart Failure (CHF)  General Interventions   General Interventions Discussed/Reviewed General Interventions Reviewed, Doctor Visits  [Evaluation of current treatment plan for health condition and patient's adherence to plan.]  Doctor Visits Discussed/Reviewed Doctor Visits Reviewed, PCP, Specialist  PCP/Specialist Visits Compliance with follow-up visit  [reviewed upcoming appointments including nephrology appointment scheduled for the beginning of January.]  Exercise Interventions   Exercise Discussed/Reviewed Exercise Reviewed  Education Interventions   Education Provided Provided Education, Provided Printed Education  [sent education: diabetes and diet,  Hbg a1C test,  checking our blood sugar at home, the abc's of diabetes]  Provided Verbal Education On Nutrition, Exercise, Medication, When to see the doctor, Blood Sugar Monitoring  [discussed how exercised can help lower blood sugar and impact overall health, reviewed signs/symptoms of HF exacerbation, discussed nutrition and portion control]  Nutrition  Interventions   Nutrition Discussed/Reviewed Nutrition Discussed  Pharmacy Interventions   Pharmacy Dicussed/Reviewed Pharmacy Topics Reviewed, Pharmacy Topics Discussed  [Medication review completed. discussed Mounjaro-patient declines to take. RNCM discussed clinical pharmacist referral for education and assistance. patient declines]            SDOH assessments and interventions completed:  No  Care Coordination Interventions:  Yes, provided   Follow up plan: Follow up call scheduled for 09/20/23    Encounter Outcome:  Patient Visit Completed   Kathyrn Sheriff, RN, MSN, BSN, CCM Care Management Coordinator [phone]

## 2023-08-19 NOTE — Patient Instructions (Signed)
Visit Information  Thank you for taking time to visit with me today. Please don't hesitate to contact me if I can be of assistance to you.   Following are the goals we discussed today:  Continue to take medications as prescribed. Continue to attend provider visits as scheduled Continue to eat healthy, lean meats, vegetables, fruits, avoid saturated and transfats. Watch Portion sizes. Contact provider with health questions or concerns as needed Review educational article sent via mail. Contact your RN case manager with questions.  Our next appointment is by telephone on 09/20/23 at 9:30 am  Please call the care guide team at [phone] if you need to cancel or reschedule your appointment.   If you are experiencing a Mental Health or Behavioral Health Crisis or need someone to talk to, please call the Suicide and Crisis Lifeline: 988 call the [place] National Suicide Prevention Lifeline: (587)027-1421 or TTY: 3804041630 TTY [phone]) to talk to a trained counselor   Kathyrn Sheriff, RN, MSN, BSN, CCM Care Management Coordinator [phone]

## 2023-09-09 ENCOUNTER — Other Ambulatory Visit: Payer: Self-pay | Admitting: Internal Medicine

## 2023-09-09 MED ORDER — TIRZEPATIDE 5 MG/0.5ML ~~LOC~~ SOAJ: 5.0000 mg | SUBCUTANEOUS | 3 refills | Status: DC

## 2023-09-20 ENCOUNTER — Telehealth: Payer: Self-pay

## 2023-09-20 NOTE — Patient Outreach (Signed)
  Care Coordination   09/20/2023 Name: JAYVYN HASELTON MRN: [id number] DOB: 12/11/1955   Care Coordination Outreach Attempts:  An unsuccessful outreach was attempted for an appointment today.  Follow Up Plan:  Additional outreach attempts will be made to offer the patient complex care management information and services.   Encounter Outcome:  No Answer   Care Coordination Interventions:  No, not indicated    Heddy Shutter, RN, MSN, BSN, CCM Care Management Coordinator [phone]

## 2023-09-30 DIAGNOSIS — M109 Gout, unspecified: Secondary | ICD-10-CM | POA: Diagnosis not present

## 2023-09-30 DIAGNOSIS — E785 Hyperlipidemia, unspecified: Secondary | ICD-10-CM | POA: Diagnosis not present

## 2023-09-30 DIAGNOSIS — N1832 Chronic kidney disease, stage 3b: Secondary | ICD-10-CM | POA: Diagnosis not present

## 2023-09-30 DIAGNOSIS — R809 Proteinuria, unspecified: Secondary | ICD-10-CM | POA: Diagnosis not present

## 2023-09-30 DIAGNOSIS — N2581 Secondary hyperparathyroidism of renal origin: Secondary | ICD-10-CM | POA: Diagnosis not present

## 2023-09-30 DIAGNOSIS — N179 Acute kidney failure, unspecified: Secondary | ICD-10-CM | POA: Diagnosis not present

## 2023-09-30 DIAGNOSIS — D631 Anemia in chronic kidney disease: Secondary | ICD-10-CM | POA: Diagnosis not present

## 2023-09-30 DIAGNOSIS — I129 Hypertensive chronic kidney disease with stage 1 through stage 4 chronic kidney disease, or unspecified chronic kidney disease: Secondary | ICD-10-CM | POA: Diagnosis not present

## 2023-09-30 DIAGNOSIS — N189 Chronic kidney disease, unspecified: Secondary | ICD-10-CM | POA: Diagnosis not present

## 2023-09-30 DIAGNOSIS — I10 Essential (primary) hypertension: Secondary | ICD-10-CM | POA: Diagnosis not present

## 2023-10-07 ENCOUNTER — Ambulatory Visit: Payer: Self-pay

## 2023-10-07 NOTE — Patient Outreach (Signed)
  Care Coordination   Follow Up Visit Note   10/07/2023 Name: Gerald Day MRN: [id number] DOB: [birth year]  Gerald Day is a 68 y.o. year old male who sees Corwin Levins, MD for primary care. I spoke with  [name] by phone today.  What matters to the patients health and wellness today?  [name] states, "Everything is fine". He reports he had follow up with nephrologist last week. States he did get scales and weight self about twice a week. He states his Blood pressure is "good". Patient reports last check at nephrology visit it was around 128/64. He denies any questions or problems at this time. Declines any additional educational material. Patient states he will contact RNCM if care management needs in the future.  Goals Addressed             This Visit's Progress    COMPLETED: Health Management       Interventions Today    Flowsheet Row Most Recent Value  Chronic Disease   Chronic disease during today's visit Diabetes, Hypertension (HTN), Congestive Heart Failure (CHF)  General Interventions   General Interventions Discussed/Reviewed General Interventions Reviewed, Doctor Visits  [Evaluation of current treatment plan for health condition and patient's adherence to plan. reveiwed care management disciplines and encoruaged patient to call RNCM if care management needs in the future.]  Doctor Visits Discussed/Reviewed Doctor Visits Reviewed  Exercise Interventions   Exercise Discussed/Reviewed Physical Activity  Physical Activity Discussed/Reviewed Physical Activity Reviewed  [assessed activity level. patient reports he remains active with walking and doing things around the house.]  Education Interventions   Education Provided Provided Education  Provided Verbal Education On When to see the doctor, Medication, Exercise, Blood Sugar Monitoring  [reviewed signs/symptoms of HF exacerbation and when to call the doctor. advised to take medications as prescribed, attend  provider visits as scheduled/recommended, eat healthy, continue to remain active]  Pharmacy Interventions   Pharmacy Dicussed/Reviewed Pharmacy Topics Reviewed  [medications reviewed]            SDOH assessments and interventions completed:  No  SDOH Interventions Today    Flowsheet Row Most Recent Value  SDOH Interventions   Food Insecurity Interventions Intervention Not Indicated  Housing Interventions Intervention Not Indicated  Transportation Interventions Intervention Not Indicated  Utilities Interventions Intervention Not Indicated     Care Coordination Interventions:  Yes, provided   Follow up plan: No further intervention required.   Encounter Outcome:  Patient Visit Completed

## 2023-10-07 NOTE — Patient Instructions (Signed)
Visit Information  Thank you for taking time to visit with me today. Please don't hesitate to contact me if I can be of assistance to you.   Following are the goals we discussed today:  It is very important to manage your Chronic Conditions that affects your overall health. Continue to take medications as prescribed. Continue to attend provider visits as scheduled Continue to eat healthy, lean meats, vegetables, fruits, avoid saturated and transfats Contact provider with health questions or concerns as needed Monitor your blood sugars to ensure that they stay in an acceptable range. Discuss with your doctor Primary Care Provider Continue to check blood pressure routinely and contact provider if questions or concerns Continue to monitor weights for any signs/symptoms of heart failure worsening. Notify provider if any questions or concerns.  If you are experiencing a Mental Health or Behavioral Health Crisis or need someone to talk to, please call the Suicide and Crisis Lifeline: 988 call the [place] National Suicide Prevention Lifeline: (973)179-1661 or TTY: 863-552-1358 TTY 9725335357) to talk to a trained counselor  Kathyrn Sheriff, RN, MSN, BSN, CCM [place]  [facility], Population Health Case Manager Phone: [phone]

## 2024-01-05 DIAGNOSIS — I5032 Chronic diastolic (congestive) heart failure: Secondary | ICD-10-CM | POA: Diagnosis not present

## 2024-01-05 DIAGNOSIS — N1832 Chronic kidney disease, stage 3b: Secondary | ICD-10-CM | POA: Diagnosis not present

## 2024-01-05 DIAGNOSIS — E785 Hyperlipidemia, unspecified: Secondary | ICD-10-CM | POA: Diagnosis not present

## 2024-01-05 DIAGNOSIS — I130 Hypertensive heart and chronic kidney disease with heart failure and stage 1 through stage 4 chronic kidney disease, or unspecified chronic kidney disease: Secondary | ICD-10-CM | POA: Diagnosis not present

## 2024-01-05 DIAGNOSIS — N179 Acute kidney failure, unspecified: Secondary | ICD-10-CM | POA: Diagnosis not present

## 2024-01-05 DIAGNOSIS — N2581 Secondary hyperparathyroidism of renal origin: Secondary | ICD-10-CM | POA: Diagnosis not present

## 2024-01-05 DIAGNOSIS — M109 Gout, unspecified: Secondary | ICD-10-CM | POA: Diagnosis not present

## 2024-01-05 DIAGNOSIS — N189 Chronic kidney disease, unspecified: Secondary | ICD-10-CM | POA: Diagnosis not present

## 2024-01-05 DIAGNOSIS — E1122 Type 2 diabetes mellitus with diabetic chronic kidney disease: Secondary | ICD-10-CM | POA: Diagnosis not present

## 2024-01-05 DIAGNOSIS — D631 Anemia in chronic kidney disease: Secondary | ICD-10-CM | POA: Diagnosis not present

## 2024-01-06 LAB — LAB REPORT - SCANNED
Creatinine, POC: 54.2 mg/dL
EGFR: 33

## 2024-01-11 ENCOUNTER — Encounter: Payer: Self-pay | Admitting: Internal Medicine

## 2024-01-11 ENCOUNTER — Ambulatory Visit (INDEPENDENT_AMBULATORY_CARE_PROVIDER_SITE_OTHER): Payer: Medicare HMO | Admitting: Internal Medicine

## 2024-01-11 VITALS — BP 134/78 | HR 62 | Temp 98.1°F | Ht 72.0 in | Wt 372.0 lb

## 2024-01-11 DIAGNOSIS — E559 Vitamin D deficiency, unspecified: Secondary | ICD-10-CM

## 2024-01-11 DIAGNOSIS — Z125 Encounter for screening for malignant neoplasm of prostate: Secondary | ICD-10-CM

## 2024-01-11 DIAGNOSIS — I10 Essential (primary) hypertension: Secondary | ICD-10-CM

## 2024-01-11 DIAGNOSIS — Z0001 Encounter for general adult medical examination with abnormal findings: Secondary | ICD-10-CM

## 2024-01-11 DIAGNOSIS — Z6841 Body Mass Index (BMI) 40.0 and over, adult: Secondary | ICD-10-CM

## 2024-01-11 DIAGNOSIS — E1169 Type 2 diabetes mellitus with other specified complication: Secondary | ICD-10-CM | POA: Diagnosis not present

## 2024-01-11 DIAGNOSIS — Z7985 Long-term (current) use of injectable non-insulin antidiabetic drugs: Secondary | ICD-10-CM

## 2024-01-11 DIAGNOSIS — E669 Obesity, unspecified: Secondary | ICD-10-CM

## 2024-01-11 DIAGNOSIS — E7800 Pure hypercholesterolemia, unspecified: Secondary | ICD-10-CM

## 2024-01-11 DIAGNOSIS — E538 Deficiency of other specified B group vitamins: Secondary | ICD-10-CM

## 2024-01-11 LAB — HEPATIC FUNCTION PANEL
ALT: 15 U/L (ref 0–53)
AST: 11 U/L (ref 0–37)
Albumin: 3.8 g/dL (ref 3.5–5.2)
Alkaline Phosphatase: 119 U/L — ABNORMAL HIGH (ref 39–117)
Bilirubin, Direct: 0.1 mg/dL (ref 0.0–0.3)
Total Bilirubin: 0.6 mg/dL (ref 0.2–1.2)
Total Protein: 7 g/dL (ref 6.0–8.3)

## 2024-01-11 LAB — CBC WITH DIFFERENTIAL/PLATELET
Basophils Absolute: 0.1 10*3/uL (ref 0.0–0.1)
Basophils Relative: 0.8 % (ref 0.0–3.0)
Eosinophils Absolute: 0.5 10*3/uL (ref 0.0–0.7)
Eosinophils Relative: 6.1 % — ABNORMAL HIGH (ref 0.0–5.0)
HCT: 36.4 % — ABNORMAL LOW (ref 39.0–52.0)
Hemoglobin: 12.3 g/dL — ABNORMAL LOW (ref 13.0–17.0)
Lymphocytes Relative: 13.5 % (ref 12.0–46.0)
Lymphs Abs: 1.2 10*3/uL (ref 0.7–4.0)
MCHC: 33.9 g/dL (ref 30.0–36.0)
MCV: 94 fl (ref 78.0–100.0)
Monocytes Absolute: 0.7 10*3/uL (ref 0.1–1.0)
Monocytes Relative: 7.5 % (ref 3.0–12.0)
Neutro Abs: 6.3 10*3/uL (ref 1.4–7.7)
Neutrophils Relative %: 72.1 % (ref 43.0–77.0)
Platelets: 215 10*3/uL (ref 150.0–400.0)
RBC: 3.87 Mil/uL — ABNORMAL LOW (ref 4.22–5.81)
RDW: 14.1 % (ref 11.5–15.5)
WBC: 8.8 10*3/uL (ref 4.0–10.5)

## 2024-01-11 LAB — BASIC METABOLIC PANEL WITH GFR
BUN: 57 mg/dL — ABNORMAL HIGH (ref 6–23)
CO2: 26 meq/L (ref 19–32)
Calcium: 9.1 mg/dL (ref 8.4–10.5)
Chloride: 100 meq/L (ref 96–112)
Creatinine, Ser: 2.51 mg/dL — ABNORMAL HIGH (ref 0.40–1.50)
GFR: 25.53 mL/min — ABNORMAL LOW (ref >60.00)
Glucose, Bld: 287 mg/dL — ABNORMAL HIGH (ref 70–99)
Potassium: 5 meq/L (ref 3.5–5.1)
Sodium: 135 meq/L (ref 135–145)

## 2024-01-11 LAB — PSA: PSA: 0.72 ng/mL (ref 0.10–4.00)

## 2024-01-11 LAB — URINALYSIS, ROUTINE W REFLEX MICROSCOPIC
Bilirubin Urine: NEGATIVE
Ketones, ur: NEGATIVE
Leukocytes,Ua: NEGATIVE
Nitrite: NEGATIVE
Specific Gravity, Urine: 1.02 (ref 1.000–1.030)
Total Protein, Urine: >=300 — AB
Urine Glucose: 100 — AB
Urobilinogen, UA: 0.2 (ref 0.0–1.0)
pH: 6 (ref 5.0–8.0)

## 2024-01-11 LAB — HEMOGLOBIN A1C: Hgb A1c MFr Bld: 9.8 % — ABNORMAL HIGH (ref 4.6–6.5)

## 2024-01-11 LAB — LIPID PANEL
Cholesterol: 130 mg/dL (ref 0–200)
HDL: 38.8 mg/dL — ABNORMAL LOW (ref >39.00)
LDL Cholesterol: 64 mg/dL (ref 0–99)
NonHDL: 91.57
Total CHOL/HDL Ratio: 3
Triglycerides: 140 mg/dL (ref 0.0–149.0)
VLDL: 28 mg/dL (ref 0.0–40.0)

## 2024-01-11 LAB — MICROALBUMIN / CREATININE URINE RATIO
Creatinine,U: 63 mg/dL
Microalb Creat Ratio: 1539.9 mg/g — ABNORMAL HIGH (ref 0.0–30.0)
Microalb, Ur: 96.9 mg/dL — ABNORMAL HIGH (ref 0.0–1.9)

## 2024-01-11 LAB — TSH: TSH: 3.07 u[IU]/mL (ref 0.35–5.50)

## 2024-01-11 LAB — VITAMIN B12: Vitamin B-12: 1105 pg/mL — ABNORMAL HIGH (ref 211–911)

## 2024-01-11 LAB — VITAMIN D 25 HYDROXY (VIT D DEFICIENCY, FRACTURES): VITD: 26.36 ng/mL — ABNORMAL LOW (ref 30.00–100.00)

## 2024-01-11 MED ORDER — TIRZEPATIDE 2.5 MG/0.5ML ~~LOC~~ SOAJ: 2.5000 mg | SUBCUTANEOUS | 11 refills | Status: DC

## 2024-01-11 MED ORDER — TIRZEPATIDE 2.5 MG/0.5ML ~~LOC~~ SOAJ
2.5000 mg | SUBCUTANEOUS | 11 refills | Status: DC
Start: 2024-01-11 — End: 2024-01-11

## 2024-01-11 NOTE — Assessment & Plan Note (Signed)
 Lab Results  Component Value Date   HGBA1C 7.5 (H) 07/07/2023   uncontrolled, pt to tray again to start mounjaro  2.5 mg weekly with intent to titrate

## 2024-01-11 NOTE — Assessment & Plan Note (Signed)
 Age and sex appropriate education and counseling updated with regular exercise and diet Referrals for preventative services - for cologuard  - has kit at home Immunizations addressed - none needed Smoking counseling  - none needed Evidence for depression or other mood disorder - none significant Most recent labs reviewed. I have personally reviewed and have noted: 1) the patient's medical and social history 2) The patient's current medications and supplements 3) The patient's height, weight, and BMI have been recorded in the chart

## 2024-01-11 NOTE — Assessment & Plan Note (Signed)
 Last vitamin D  Lab Results  Component Value Date   VD25OH 25.31 (L) 07/07/2023   Low, to use oral replacement

## 2024-01-11 NOTE — Assessment & Plan Note (Signed)
 BP Readings from Last 3 Encounters:  01/11/24 134/78  07/14/23 (!) 140/80  05/25/23 130/82   Stable and seems improved on trial again per renal with f/u bmp in 1 month

## 2024-01-11 NOTE — Assessment & Plan Note (Signed)
 Lab Results  Component Value Date   VITAMINB12 908 07/07/2023   Stable, cont oral replacement - b12 1000 mcg qd

## 2024-01-11 NOTE — Progress Notes (Signed)
 Patient ID: Gerald Day, male   DOB: [birth year], 68 y.o.   MRN: [id number]         Chief Complaint:: wellness exam and dm with supermorbid obesity, hld, htn, low vit d and b12       HPI:  Gerald Day is a 68 y.o. male here for wellness exam; declines colonoscopy but or cologuard per insurance kit at home now, for shingrix at pharmacy, o/w up to date                        Also saw renal Dr C with restart telmisartan  40 mg for 30 day trial with BMP after.  Pt denies chest pain, increased sob or doe, wheezing, orthopnea, PND, increased LE swelling, palpitations, dizziness or syncope.   Pt denies polydipsia, polyuria, or new focal neuro s/s.    Pt denies fever, wt loss, night sweats, loss of appetite, or other constitutional symptoms  mounjaro  was too expensive last year, but willing to try again.    Wt Readings from Last 3 Encounters:  01/11/24 (!) 372 lb (168.7 kg)  07/14/23 (!) 359 lb (162.8 kg)  05/25/23 (!) 359 lb (162.8 kg)   BP Readings from Last 3 Encounters:  01/11/24 134/78  07/14/23 (!) 140/80  05/25/23 130/82   Immunization History  Administered Date(s) Administered   Fluad Quad(high Dose 65+) 07/04/2021   Fluad Trivalent(High Dose 65+) 07/14/2023   PFIZER(Purple Top)SARS-COV-2 Vaccination 02/26/2020, 03/27/2020   PNEUMOCOCCAL CONJUGATE-20 01/13/2023   Pneumococcal Polysaccharide-23 10/27/2013, 07/04/2021   Td 08/28/2008   Tdap 12/22/2018   Health Maintenance Due  Topic Date Due   Zoster Vaccines- Shingrix (1 of 2) Never done   Diabetic kidney evaluation - Urine ACR  01/06/2024   HEMOGLOBIN A1C  01/05/2024   Medicare Annual Wellness (AWV)  02/26/2024      Past Medical History:  Diagnosis Date   Arthritis    IN KNEES   Cellulitis 10/26/2013   LOWER RT EXTREMITY   CELLULITIS/ABSCESS, LEG 06/27/2007   COLONIC POLYPS    CONGESTIVE HEART FAILURE    DIABETES MELLITUS, TYPE II    DIVERTICULOSIS, COLON    ERECTILE DYSFUNCTION, ORGANIC    GOUT NOS     Headache    HYPERLIPIDEMIA    HYPERTENSION    LOW BACK PAIN    Morbid obesity (HCC)    SLEEP APNEA, OBSTRUCTIVE    USES CPAP   Past Surgical History:  Procedure Laterality Date   APPENDECTOMY     CARDIOVERSION N/A 08/02/2020   Procedure: CARDIOVERSION;  Surgeon: Luana Rumple, MD;  Location: [facility] ENDOSCOPY;  Service: Cardiovascular;  Laterality: N/A;   FOOT SURGERY     LT   TEE WITHOUT CARDIOVERSION N/A 08/02/2020   Procedure: TRANSESOPHAGEAL ECHOCARDIOGRAM (TEE);  Surgeon: Luana Rumple, MD;  Location: [facility] ENDOSCOPY;  Service: Cardiovascular;  Laterality: N/A;   TENOTOMY / FLEXOR TENDON TRANSFER Right 03/15/2015   Procedure: TENOTOMY HT REPAIR/ULCER DEBRIDEMENT/GRAFT PREP/ACELL GRAFT APPLICATION;  Surgeon: Norva Beecham, DPM;  Location: [facility] OR;  Service: Podiatry;  Laterality: Right;  HALLUX    reports that he has never smoked. He has never used smokeless tobacco. He reports that he does not currently use alcohol after a past usage of about 3.0 - 4.0 standard drinks of alcohol per week. He reports that he does not use drugs. family history includes Asthma in an other family member; Cardiomyopathy in his father. Allergies  Allergen Reactions   Penicillins Other (See  Comments)    Unknown childhood allergic reaction Has patient had a PCN reaction causing immediate rash, facial/tongue/throat swelling, SOB or lightheadedness with hypotension: Unknown Has patient had a PCN reaction causing severe rash involving mucus membranes or skin necrosis: Unknown Has patient had a PCN reaction that required hospitalization: No Has patient had a PCN reaction occurring within the last 10 years: No If all of the above answers are "NO", then may proceed with Cephalosporin use.    Tizanidine  Other (See Comments)    07/26/20: Pt does not recognize drug and does not remember being allergic to it.   Current Outpatient Medications on File Prior to Visit  Medication Sig Dispense Refill   allopurinol   (ZYLOPRIM ) 100 MG tablet Take 1 tablet (100 mg total) by mouth daily. 90 tablet 3   amLODipine  (NORVASC ) 10 MG tablet Take 1 tablet (10 mg total) by mouth daily. 90 tablet 3   apixaban  (ELIQUIS ) 5 MG TABS tablet Take 1 tablet (5 mg total) by mouth 2 (two) times daily. 180 tablet 3   atorvastatin  (LIPITOR) 20 MG tablet Take 1 tablet (20 mg total) by mouth daily. 90 tablet 3   carvedilol  (COREG ) 12.5 MG tablet Take 1 tablet (12.5 mg total) by mouth 2 (two) times daily with a meal. 180 tablet 3   Cholecalciferol (THERA-D 2000) 50 MCG (2000 UT) TABS 1 tab by mouth once daily 90 tablet 99   cloNIDine  (CATAPRES ) 0.2 MG tablet Take 1 tablet (0.2 mg total) by mouth 2 (two) times daily. 180 tablet 3   furosemide  (LASIX ) 80 MG tablet 1 tab by mouth in the AM, and 1/2 in the PM 45 tablet 11   telmisartan  (MICARDIS ) 40 MG tablet Take 40 mg by mouth daily.     vitamin B-12 (CYANOCOBALAMIN ) 1000 MCG tablet Take 1 tablet (1,000 mcg total) by mouth daily. 90 tablet 3   No current facility-administered medications on file prior to visit.        ROS:  All others reviewed and negative.  Objective        PE:  BP 134/78 (BP Location: Right Arm, Patient Position: Sitting, Cuff Size: Normal)   Pulse 62   Temp 98.1 F (36.7 C) (Oral)   Ht 6' (1.829 m)   Wt (!) 372 lb (168.7 kg)   SpO2 98%   BMI 50.45 kg/m                 Constitutional: Pt appears in NAD               HENT: Head: NCAT.                Right Ear: External ear normal.                 Left Ear: External ear normal.                Eyes: . Pupils are equal, round, and reactive to light. Conjunctivae and EOM are normal               Nose: without d/c or deformity               Neck: Neck supple. Gross normal ROM               Cardiovascular: Normal rate and regular rhythm.                 Pulmonary/Chest: Effort normal and breath sounds without rales or wheezing.  Abd:  Soft, NT, ND, + BS, no organomegaly                Neurological: Pt is alert. At baseline orientation, motor grossly intact               Skin: Skin is warm. No rashes, no other new lesions, LE edema - none               Psychiatric: Pt behavior is normal without agitation   Micro: none  Cardiac tracings I have personally interpreted today:  none  Pertinent Radiological findings (summarize): none   Lab Results  Component Value Date   WBC 9.9 05/11/2023   HGB 10.8 (L) 05/11/2023   HCT 32.9 (L) 05/11/2023   PLT 214 05/11/2023   GLUCOSE 200 (H) 07/07/2023   CHOL 111 07/07/2023   TRIG 137.0 07/07/2023   HDL 36.30 (L) 07/07/2023   LDLDIRECT 83.0 12/19/2018   LDLCALC 47 07/07/2023   ALT 23 07/07/2023   AST 14 07/07/2023   NA 140 07/07/2023   K 4.9 07/07/2023   CL 106 07/07/2023   CREATININE 2.78 (H) 07/07/2023   BUN 69 (H) 07/07/2023   CO2 26 07/07/2023   TSH 1.88 01/06/2023   PSA 0.83 01/06/2023   INR 1.4 (H) 07/26/2020   HGBA1C 7.5 (H) 07/07/2023   MICROALBUR 95.3 (H) 01/06/2023   Assessment/Plan:  WILMON CRITCHER is a 68 y.o. White or Caucasian [1] male with  has a past medical history of Arthritis, Cellulitis (10/26/2013), CELLULITIS/ABSCESS, LEG (06/27/2007), COLONIC POLYPS, CONGESTIVE HEART FAILURE, DIABETES MELLITUS, TYPE II, DIVERTICULOSIS, COLON, ERECTILE DYSFUNCTION, ORGANIC, GOUT NOS, Headache, HYPERLIPIDEMIA, HYPERTENSION, LOW BACK PAIN, Morbid obesity (HCC), and SLEEP APNEA, OBSTRUCTIVE.  Encounter for well adult exam with abnormal findings Age and sex appropriate education and counseling updated with regular exercise and diet Referrals for preventative services - for cologuard  - has kit at home Immunizations addressed - none needed Smoking counseling  - none needed Evidence for depression or other mood disorder - none significant Most recent labs reviewed. I have personally reviewed and have noted: 1) the patient's medical and social history 2) The patient's current medications and supplements 3) The  patient's height, weight, and BMI have been recorded in the chart   Diabetes mellitus type 2 in obese Lab Results  Component Value Date   HGBA1C 7.5 (H) 07/07/2023   uncontrolled, pt to tray again to start mounjaro  2.5 mg weekly with intent to titrate   Hyperlipidemia Lab Results  Component Value Date   LDLCALC 47 07/07/2023   Stable, pt to continue current statin lipitor 20 mg qd   Essential hypertension BP Readings from Last 3 Encounters:  01/11/24 134/78  07/14/23 (!) 140/80  05/25/23 130/82   Stable and seems improved on trial again per renal with f/u bmp in 1 month   Vitamin D  deficiency Last vitamin D  Lab Results  Component Value Date   VD25OH 25.31 (L) 07/07/2023   Low, to use oral replacement   B12 deficiency Lab Results  Component Value Date   VITAMINB12 908 07/07/2023   Stable, cont oral replacement - b12 1000 mcg qd  Followup: Return in about 6 months (around 07/13/2024).  Rosalia Colonel, MD 01/11/2024 1:05 PM [facility]

## 2024-01-11 NOTE — Assessment & Plan Note (Signed)
 Lab Results  Component Value Date   LDLCALC 47 07/07/2023   Stable, pt to continue current statin lipitor 20 mg qd

## 2024-01-11 NOTE — Patient Instructions (Addendum)
 Please have your Shingrix (shingles) shots done at your local pharmacy.  Please take all new medication as prescribed - the mounjaro  2.5 mg at the [facility] [address]; and CALL in 1  month if you are doing well with stomach issues, so we can increase to 5 mg, and then even higher if you are tolerating well  Please continue all other medications as before, and refills have been done if requested.  Please have the pharmacy call with any other refills you may need.  Please continue your efforts at being more active, low cholesterol diet, and weight control.  Please keep your appointments with your specialists as you may have planned - Renal - Dr C  Please go to the LAB at the blood drawing area for the tests to be done  You will be contacted by phone if any changes need to be made immediately.  Otherwise, you will receive a letter about your results with an explanation, but please check with MyChart first.  Please make an Appointment to return in 6 months, or sooner if needed

## 2024-01-16 ENCOUNTER — Other Ambulatory Visit: Payer: Self-pay | Admitting: Internal Medicine

## 2024-01-17 ENCOUNTER — Other Ambulatory Visit: Payer: Self-pay

## 2024-01-26 DIAGNOSIS — N1832 Chronic kidney disease, stage 3b: Secondary | ICD-10-CM | POA: Diagnosis not present

## 2024-01-26 DIAGNOSIS — I10 Essential (primary) hypertension: Secondary | ICD-10-CM | POA: Diagnosis not present

## 2024-02-01 ENCOUNTER — Other Ambulatory Visit: Payer: Self-pay | Admitting: Internal Medicine

## 2024-02-01 ENCOUNTER — Other Ambulatory Visit: Payer: Self-pay

## 2024-02-28 ENCOUNTER — Ambulatory Visit (INDEPENDENT_AMBULATORY_CARE_PROVIDER_SITE_OTHER): Payer: Medicare HMO

## 2024-02-28 ENCOUNTER — Other Ambulatory Visit: Payer: Self-pay | Admitting: Internal Medicine

## 2024-02-28 ENCOUNTER — Other Ambulatory Visit: Payer: Self-pay

## 2024-02-28 VITALS — BP 120/65 | HR 55 | Ht 72.0 in | Wt 372.0 lb

## 2024-02-28 DIAGNOSIS — Z0000 Encounter for general adult medical examination without abnormal findings: Secondary | ICD-10-CM | POA: Diagnosis not present

## 2024-02-28 NOTE — Patient Instructions (Signed)
 Gerald Day , Thank you for taking time out of your busy schedule to complete your Annual Wellness Visit with me. I enjoyed our conversation and look forward to speaking with you again next year. I, as well as your care team,  appreciate your ongoing commitment to your health goals. Please review the following plan we discussed and let me know if I can assist you in the future. Your Game plan/ To Do List    Referrals: If you haven't heard from the office you've been referred to, please reach out to them at the phone provided.  Please call [facility], at [phone], to get scheduled for a colonoscopy.  Follow up Visits: Next Medicare AWV with our clinical staff: 02/28/2025.   Have you seen your provider in the last 6 months (3 months if uncontrolled diabetes)? Yes Next Office Visit with your provider: 07/13/2024.  Clinician Recommendations:  Aim for 30 minutes of exercise or brisk walking, 6-8 glasses of water, and 5 servings of fruits and vegetables each day. You are due for a colonoscopy.  Remember to call your insurance carrier to find out about a eye doctor, as you are due for a exam.        This is a list of the screening recommended for you and due dates:  Health Maintenance  Topic Date Due   Zoster (Shingles) Vaccine (1 of 2) Never done   Medicare Annual Wellness Visit  02/26/2024   Colon Cancer Screening  01/10/2025*   Eye exam for diabetics  03/23/2024   Hemoglobin A1C  07/13/2024   Yearly kidney function blood test for diabetes  01/10/2025   Yearly kidney health urinalysis for diabetes  01/10/2025   Complete foot exam   01/10/2025   DTaP/Tdap/Td vaccine (3 - Td or Tdap) 12/21/2028   Pneumococcal Vaccine for age over 61  Completed   Hepatitis C Screening  Completed   HPV Vaccine  Aged Out   Meningitis B Vaccine  Aged Out   Flu Shot  Discontinued   COVID-19 Vaccine  Discontinued  *Topic was postponed. The date shown is not the original due date.     Advanced directives: (Declined) Advance directive discussed with you today. Even though you declined this today, please call our office should you change your mind, and we can give you the proper paperwork for you to fill out. Advance Care Planning is important because it:  [x]  Makes sure you receive the medical care that is consistent with your values, goals, and preferences  [x]  It provides guidance to your family and loved ones and reduces their decisional burden about whether or not they are making the right decisions based on your wishes.  Follow the link provided in your after visit summary or read over the paperwork we have mailed to you to help you started getting your Advance Directives in place. If you need assistance in completing these, please reach out to us  so that we can help you!  See attachments for Preventive Care and Fall Prevention Tips.

## 2024-02-28 NOTE — Progress Notes (Signed)
 Subjective:   Gerald Day is a 68 y.o. who presents for a Medicare Wellness preventive visit.  As a reminder, Annual Wellness Visits don't include a physical exam, and some assessments may be limited, especially if this visit is performed virtually. We may recommend an in-person follow-up visit with your provider if needed.  Visit Complete: Virtual I connected with  Ozell JONELLE Search on 02/28/24 by a audio enabled telemedicine application and verified that I am speaking with the correct person using two identifiers.  Patient Location: Home  Provider Location: Home Office  I discussed the limitations of evaluation and management by telemedicine. The patient expressed understanding and agreed to proceed.  Vital Signs: Because this visit was a virtual/telehealth visit, some criteria may be missing or patient reported. Any vitals not documented were not able to be obtained and vitals that have been documented are patient reported.  VideoDeclined- This patient declined Librarian, academic. Therefore the visit was completed with audio only.  Persons Participating in Visit: Patient.  AWV Questionnaire: No: Patient Medicare AWV questionnaire was not completed prior to this visit.  Cardiac Risk Factors include: hypertension;male gender;diabetes mellitus;advanced age (>81men, >62 women);dyslipidemia;Other (see comment);obesity (BMI >30kg/m2), Risk factor comments: OSA, CKD stage3,  A-Fib     Objective:    Today's Vitals   02/28/24 1054  BP: 120/65  Pulse: (!) 55  Weight: (!) 372 lb (168.7 kg)  Height: 6' (1.829 m)   Body mass index is 50.45 kg/m.     02/28/2024   11:03 AM 05/13/2023   10:22 AM 05/09/2023    9:06 AM 02/26/2023    1:23 PM 02/23/2022    1:17 PM 09/01/2020    4:12 PM 07/26/2020    8:53 PM  Advanced Directives  Does Patient Have a Medical Advance Directive? No No No No No No Yes  Type of Advance Directive       Healthcare Power of Attorney   Does patient want to make changes to medical advance directive?       No - Patient declined  Copy of Healthcare Power of Attorney in Chart?       No - copy requested  Would patient like information on creating a medical advance directive?  Yes (Inpatient - patient defers creating a medical advance directive and declines information at this time)  No - Patient declined No - Patient declined No - Patient declined     Current Medications (verified) Outpatient Encounter Medications as of 02/28/2024  Medication Sig   allopurinol  (ZYLOPRIM ) 100 MG tablet Take 1 tablet (100 mg total) by mouth daily.   amLODipine  (NORVASC ) 10 MG tablet TAKE 1 TABLET EVERY DAY   apixaban  (ELIQUIS ) 5 MG TABS tablet Take 1 tablet (5 mg total) by mouth 2 (two) times daily.   atorvastatin  (LIPITOR) 20 MG tablet TAKE 1 TABLET EVERY DAY   carvedilol  (COREG ) 12.5 MG tablet TAKE 1 TABLET TWICE DAILY WITH MEALS   Cholecalciferol (THERA-D 2000) 50 MCG (2000 UT) TABS 1 tab by mouth once daily   cloNIDine  (CATAPRES ) 0.2 MG tablet TAKE 1 TABLET TWICE DAILY   telmisartan  (MICARDIS ) 40 MG tablet Take 40 mg by mouth daily.   tirzepatide  (MOUNJARO ) 2.5 MG/0.5ML Pen Inject 2.5 mg into the skin once a week.   vitamin B-12 (CYANOCOBALAMIN ) 1000 MCG tablet Take 1 tablet (1,000 mcg total) by mouth daily.   [DISCONTINUED] furosemide  (LASIX ) 80 MG tablet 1 tab by mouth in the AM, and 1/2 in the PM  No facility-administered encounter medications on file as of 02/28/2024.    Allergies (verified) Penicillins and Tizanidine    History: Past Medical History:  Diagnosis Date   Arthritis    IN KNEES   Cellulitis 10/26/2013   LOWER RT EXTREMITY   CELLULITIS/ABSCESS, LEG 06/27/2007   COLONIC POLYPS    CONGESTIVE HEART FAILURE    DIABETES MELLITUS, TYPE II    DIVERTICULOSIS, COLON    ERECTILE DYSFUNCTION, ORGANIC    GOUT NOS    Headache    HYPERLIPIDEMIA    HYPERTENSION    LOW BACK PAIN    Morbid obesity (HCC)    SLEEP APNEA,  OBSTRUCTIVE    USES CPAP   Past Surgical History:  Procedure Laterality Date   APPENDECTOMY     CARDIOVERSION N/A 08/02/2020   Procedure: CARDIOVERSION;  Surgeon: Francyne Headland, MD;  Location: [facility];  Service: Cardiovascular;  Laterality: N/A;   FOOT SURGERY     LT   TEE WITHOUT CARDIOVERSION N/A 08/02/2020   Procedure: TRANSESOPHAGEAL ECHOCARDIOGRAM (TEE);  Surgeon: Francyne Headland, MD;  Location: [facility] ENDOSCOPY;  Service: Cardiovascular;  Laterality: N/A;   TENOTOMY / FLEXOR TENDON TRANSFER Right 03/15/2015   Procedure: TENOTOMY HT REPAIR/ULCER DEBRIDEMENT/GRAFT PREP/ACELL GRAFT APPLICATION;  Surgeon: Selinda JAYSON Slovak, DPM;  Location: [facility] OR;  Service: Podiatry;  Laterality: Right;  HALLUX   Family History  Problem Relation Age of Onset   Cardiomyopathy Father    Asthma Other    Social History   Socioeconomic History   Marital status: Widowed    Spouse name: Not on file   Number of children: 3   Years of education: Not on file   Highest education level: Not on file  Occupational History   Occupation: retired  Tobacco Use   Smoking status: Never   Smokeless tobacco: Never  Vaping Use   Vaping status: Never Used  Substance and Sexual Activity   Alcohol use: Not Currently    Alcohol/week: 3.0 - 4.0 standard drinks of alcohol    Types: 3 - 4 Cans of beer per week    Comment: hardly any; h/o heavy use   Drug use: No   Sexual activity: Not on file  Other Topics Concern   Not on file  Social History Narrative   Lives alone/2025   Social Drivers of Health   Financial Resource Strain: Low Risk  (02/28/2024)   Overall Financial Resource Strain (CARDIA)    Difficulty of Paying Living Expenses: Not very hard  Food Insecurity: No Food Insecurity (02/28/2024)   Hunger Vital Sign    Worried About Running Out of Food in the Last Year: Never true    Ran Out of Food in the Last Year: Never true  Transportation Needs: No Transportation Needs (02/28/2024)   PRAPARE -  Administrator, Civil Service (Medical): No    Lack of Transportation (Non-Medical): No  Physical Activity: Inactive (02/28/2024)   Exercise Vital Sign    Days of Exercise per Week: 0 days    Minutes of Exercise per Session: 0 min  Stress: No Stress Concern Present (02/28/2024)   [employer] of Occupational Health - Occupational Stress Questionnaire    Feeling of Stress: Not at all  Social Connections: Socially Isolated (02/28/2024)   Social Connection and Isolation Panel    Frequency of Communication with Friends and Family: More than three times a week    Frequency of Social Gatherings with Friends and Family: More than three times a week  Attends Religious Services: Never    Active Member of Clubs or Organizations: No    Attends Banker Meetings: Never    Marital Status: Widowed    Tobacco Counseling Counseling given: Not Answered    Clinical Intake:  Pre-visit preparation completed: Yes  Pain : No/denies pain     BMI - recorded: 50.45 Nutritional Status: BMI > 30  Obese Nutritional Risks: None Diabetes: Yes CBG done?: No Did pt. bring in CBG monitor from home?: Yes  Lab Results  Component Value Date   HGBA1C 9.8 (H) 01/11/2024   HGBA1C 7.5 (H) 07/07/2023   HGBA1C 6.8 (H) 01/06/2023     How often do you need to have someone help you when you read instructions, pamphlets, or other written materials from your doctor or pharmacy?: 1 - Never  Interpreter Needed?: No  Information entered by :: Cranston Koors, RMA   Activities of Daily Living     02/28/2024   10:57 AM 05/13/2023   10:22 AM  In your present state of health, do you have any difficulty performing the following activities:  Hearing? 0 0  Vision? 0 0  Difficulty concentrating or making decisions? 0 0  Walking or climbing stairs? 0 1  Dressing or bathing? 0 0  Doing errands, shopping? 0 0  Preparing Food and eating ? N   Using the Toilet? N   In the past six months,  have you accidently leaked urine? N   Do you have problems with loss of bowel control? N   Managing your Medications? N   Managing your Finances? N   Housekeeping or managing your Housekeeping? N     Patient Care Team: Norleen Lynwood ORN, MD as PCP - Diedre Lavona Lynwood, MD as PCP - Cardiology (Cardiology) Szabat, Toribio BROCKS, [facility] (Inactive) as Pharmacist (Pharmacist)  I have updated your Care Teams any recent Medical Services you may have received from other providers in the past year.     Assessment:   This is a routine wellness examination for Favian.  Hearing/Vision screen Hearing Screening - Comments:: Denies hearing difficulties   Vision Screening - Comments:: Denies vision issues./No one per pt    Goals Addressed               This Visit's Progress     <enter goal here> (pt-stated)   On track     Stay healthy.       Depression Screen     02/28/2024   11:05 AM 01/11/2024    9:04 AM 07/14/2023   10:47 AM 05/25/2023    9:51 AM 02/26/2023    1:22 PM 02/26/2023    1:19 PM 01/13/2023   10:42 AM  PHQ 2/9 Scores  PHQ - 2 Score 0 0 0 0 0 0 0  PHQ- 9 Score 1          Fall Risk     02/28/2024   11:03 AM 01/11/2024    9:07 AM 07/14/2023   10:47 AM 05/25/2023    9:50 AM 02/26/2023    1:19 PM  Fall Risk   Falls in the past year? 0 0 0 0 0  Number falls in past yr: 0 0 0 0 0  Injury with Fall? 0 0 0 0 0  Risk for fall due to : No Fall Risks No Fall Risks No Fall Risks No Fall Risks No Fall Risks  Follow up Falls evaluation completed;Falls prevention discussed Falls evaluation completed Falls evaluation completed Falls  evaluation completed Falls evaluation completed    MEDICARE RISK AT HOME:  Medicare Risk at Home Any stairs in or around the home?: Yes If so, are there any without handrails?: No Home free of loose throw rugs in walkways, pet beds, electrical cords, etc?: Yes Adequate lighting in your home to reduce risk of falls?: Yes Life alert?: No Use of a cane,  walker or w/c?: No Grab bars in the bathroom?: Yes Shower chair or bench in shower?: No Elevated toilet seat or a handicapped toilet?: No  TIMED UP AND GO:  Was the test performed?  No  Cognitive Function: Declined/Normal: No cognitive concerns noted by patient or family. Patient alert, oriented, able to answer questions appropriately and recall recent events. No signs of memory loss or confusion.        02/26/2023    1:30 PM 02/23/2022    1:17 PM  6CIT Screen  What Year? 0 points 0 points  What month? 0 points 0 points  What time? 0 points 0 points  Count back from 20 0 points 0 points  Months in reverse 0 points 0 points  Repeat phrase 0 points 0 points  Total Score 0 points 0 points    Immunizations Immunization History  Administered Date(s) Administered   Fluad Quad(high Dose 65+) 07/04/2021   Fluad Trivalent(High Dose 65+) 07/14/2023   PFIZER(Purple Top)SARS-COV-2 Vaccination 02/26/2020, 03/27/2020   PNEUMOCOCCAL CONJUGATE-20 01/13/2023   Pneumococcal Polysaccharide-23 10/27/2013, 07/04/2021   Td 08/28/2008   Tdap 12/22/2018    Screening Tests Health Maintenance  Topic Date Due   Zoster Vaccines- Shingrix (1 of 2) Never done   Medicare Annual Wellness (AWV)  02/26/2024   Colonoscopy  01/10/2025 (Originally 04/02/2012)   OPHTHALMOLOGY EXAM  03/23/2024   HEMOGLOBIN A1C  07/13/2024   Diabetic kidney evaluation - eGFR measurement  01/10/2025   Diabetic kidney evaluation - Urine ACR  01/10/2025   FOOT EXAM  01/10/2025   DTaP/Tdap/Td (3 - Td or Tdap) 12/21/2028   Pneumococcal Vaccine: 50+ Years  Completed   Hepatitis C Screening  Completed   HPV VACCINES  Aged Out   Meningococcal B Vaccine  Aged Out   INFLUENZA VACCINE  Discontinued   COVID-19 Vaccine  Discontinued    Health Maintenance  Health Maintenance Due  Topic Date Due   Zoster Vaccines- Shingrix (1 of 2) Never done   Medicare Annual Wellness (AWV)  02/26/2024   Health Maintenance Items  Addressed: Referral sent to GI for colonoscopy, See Nurse Notes at the end of this note  Additional Screening:  Vision Screening: Recommended annual ophthalmology exams for early detection of glaucoma and other disorders of the eye. Would you like a referral to an eye doctor? Yes    Dental Screening: Recommended annual dental exams for proper oral hygiene  Community Resource Referral / Chronic Care Management: CRR required this visit?  No   CCM required this visit?  No   Plan:    I have personally reviewed and noted the following in the patient's chart:   Medical and social history Use of alcohol, tobacco or illicit drugs  Current medications and supplements including opioid prescriptions. Patient is not currently taking opioid prescriptions. Functional ability and status Nutritional status Physical activity Advanced directives List of other physicians Hospitalizations, surgeries, and ER visits in previous 12 months Vitals Screenings to include cognitive, depression, and falls Referrals and appointments  In addition, I have reviewed and discussed with patient certain preventive protocols, quality metrics, and best practice  recommendations. A written personalized care plan for preventive services as well as general preventive health recommendations were provided to patient.   Libertie Hausler L Rafael Salway, CMA   02/28/2024   After Visit Summary: (Mail) Due to this being a telephonic visit, the after visit summary with patients personalized plan was offered to patient via mail   Notes: Patient is due for an diabetic eye exam.  I informed patient to seek out who is in his network for ophthalmology.  He is also due for a colonoscopy, and an order has been placed today.  Patient declines the Shingrix vaccine.

## 2024-03-24 ENCOUNTER — Other Ambulatory Visit: Payer: Self-pay | Admitting: Internal Medicine

## 2024-03-30 ENCOUNTER — Telehealth: Payer: Self-pay | Admitting: Internal Medicine

## 2024-03-30 NOTE — Telephone Encounter (Signed)
 Copied from CRM [id number]. Topic: Clinical - Prescription Issue >> Mar 30, 2024 12:01 PM [place] wrote: Reason for CRM: Patient calling requesting to go up I dosage of Mounjaro  to the 5mg - also send to [facility]

## 2024-03-31 DIAGNOSIS — N189 Chronic kidney disease, unspecified: Secondary | ICD-10-CM | POA: Diagnosis not present

## 2024-03-31 DIAGNOSIS — E1122 Type 2 diabetes mellitus with diabetic chronic kidney disease: Secondary | ICD-10-CM | POA: Diagnosis not present

## 2024-03-31 DIAGNOSIS — R809 Proteinuria, unspecified: Secondary | ICD-10-CM | POA: Diagnosis not present

## 2024-03-31 DIAGNOSIS — I130 Hypertensive heart and chronic kidney disease with heart failure and stage 1 through stage 4 chronic kidney disease, or unspecified chronic kidney disease: Secondary | ICD-10-CM | POA: Diagnosis not present

## 2024-03-31 DIAGNOSIS — N179 Acute kidney failure, unspecified: Secondary | ICD-10-CM | POA: Diagnosis not present

## 2024-03-31 DIAGNOSIS — D631 Anemia in chronic kidney disease: Secondary | ICD-10-CM | POA: Diagnosis not present

## 2024-03-31 DIAGNOSIS — N2581 Secondary hyperparathyroidism of renal origin: Secondary | ICD-10-CM | POA: Diagnosis not present

## 2024-03-31 DIAGNOSIS — E785 Hyperlipidemia, unspecified: Secondary | ICD-10-CM | POA: Diagnosis not present

## 2024-03-31 DIAGNOSIS — N1832 Chronic kidney disease, stage 3b: Secondary | ICD-10-CM | POA: Diagnosis not present

## 2024-03-31 DIAGNOSIS — I5032 Chronic diastolic (congestive) heart failure: Secondary | ICD-10-CM | POA: Diagnosis not present

## 2024-03-31 DIAGNOSIS — M109 Gout, unspecified: Secondary | ICD-10-CM | POA: Diagnosis not present

## 2024-03-31 MED ORDER — TIRZEPATIDE 5 MG/0.5ML ~~LOC~~ SOAJ
5.0000 mg | SUBCUTANEOUS | 3 refills | Status: DC
Start: 2024-03-31 — End: 2024-07-03

## 2024-03-31 NOTE — Telephone Encounter (Signed)
 Ok this is done

## 2024-05-05 DIAGNOSIS — D631 Anemia in chronic kidney disease: Secondary | ICD-10-CM | POA: Diagnosis not present

## 2024-05-05 DIAGNOSIS — I5032 Chronic diastolic (congestive) heart failure: Secondary | ICD-10-CM | POA: Diagnosis not present

## 2024-05-05 DIAGNOSIS — E1122 Type 2 diabetes mellitus with diabetic chronic kidney disease: Secondary | ICD-10-CM | POA: Diagnosis not present

## 2024-05-05 DIAGNOSIS — N2581 Secondary hyperparathyroidism of renal origin: Secondary | ICD-10-CM | POA: Diagnosis not present

## 2024-05-05 DIAGNOSIS — N179 Acute kidney failure, unspecified: Secondary | ICD-10-CM | POA: Diagnosis not present

## 2024-05-05 DIAGNOSIS — N1832 Chronic kidney disease, stage 3b: Secondary | ICD-10-CM | POA: Diagnosis not present

## 2024-05-05 DIAGNOSIS — E785 Hyperlipidemia, unspecified: Secondary | ICD-10-CM | POA: Diagnosis not present

## 2024-05-05 DIAGNOSIS — R809 Proteinuria, unspecified: Secondary | ICD-10-CM | POA: Diagnosis not present

## 2024-05-05 DIAGNOSIS — M109 Gout, unspecified: Secondary | ICD-10-CM | POA: Diagnosis not present

## 2024-05-05 DIAGNOSIS — I130 Hypertensive heart and chronic kidney disease with heart failure and stage 1 through stage 4 chronic kidney disease, or unspecified chronic kidney disease: Secondary | ICD-10-CM | POA: Diagnosis not present

## 2024-05-16 ENCOUNTER — Ambulatory Visit: Admitting: Internal Medicine

## 2024-05-18 ENCOUNTER — Ambulatory Visit (INDEPENDENT_AMBULATORY_CARE_PROVIDER_SITE_OTHER): Admitting: Internal Medicine

## 2024-05-18 ENCOUNTER — Encounter: Payer: Self-pay | Admitting: Internal Medicine

## 2024-05-18 VITALS — BP 132/74 | HR 61 | Temp 98.1°F | Ht 72.0 in | Wt 345.2 lb

## 2024-05-18 DIAGNOSIS — E669 Obesity, unspecified: Secondary | ICD-10-CM

## 2024-05-18 DIAGNOSIS — M109 Gout, unspecified: Secondary | ICD-10-CM | POA: Diagnosis not present

## 2024-05-18 DIAGNOSIS — E1169 Type 2 diabetes mellitus with other specified complication: Secondary | ICD-10-CM

## 2024-05-18 DIAGNOSIS — Z7985 Long-term (current) use of injectable non-insulin antidiabetic drugs: Secondary | ICD-10-CM

## 2024-05-18 DIAGNOSIS — R55 Syncope and collapse: Secondary | ICD-10-CM | POA: Diagnosis not present

## 2024-05-18 DIAGNOSIS — E7800 Pure hypercholesterolemia, unspecified: Secondary | ICD-10-CM

## 2024-05-18 DIAGNOSIS — N1832 Chronic kidney disease, stage 3b: Secondary | ICD-10-CM

## 2024-05-18 DIAGNOSIS — I10 Essential (primary) hypertension: Secondary | ICD-10-CM

## 2024-05-18 DIAGNOSIS — E559 Vitamin D deficiency, unspecified: Secondary | ICD-10-CM

## 2024-05-18 MED ORDER — COLCHICINE 0.6 MG PO TABS: 0.6000 mg | ORAL_TABLET | Freq: Every day | ORAL | 3 refills | Status: AC

## 2024-05-18 MED ORDER — PREDNISONE 10 MG PO TABS: ORAL_TABLET | ORAL | 0 refills | Status: AC

## 2024-05-18 NOTE — Assessment & Plan Note (Signed)
 BP Readings from Last 3 Encounters:  05/18/24 132/74  02/28/24 120/65  01/11/24 134/78   Stable, pt to continue medical treatment norvasc  10 every day, coreg  12. 5 bid, clonidine  0.2 bid, micardis 

## 2024-05-18 NOTE — Assessment & Plan Note (Signed)
 C/w likely vasovagal episode, pt declines ecg and labs today or referral to ED

## 2024-05-18 NOTE — Assessment & Plan Note (Signed)
 Last vitamin D  Lab Results  Component Value Date   VD25OH 26.36 (L) 01/11/2024   Low, to start oral replacement

## 2024-05-18 NOTE — Assessment & Plan Note (Signed)
 Lab Results  Component Value Date   HGBA1C 9.8 (H) 01/11/2024   uncontrolled, pt to continue current medical treatment mounjaro  5 mg weekly, has lost signficant wt loss with this, pt declines change today

## 2024-05-18 NOTE — Assessment & Plan Note (Signed)
 Lab Results  Component Value Date   CREATININE 2.51 (H) 01/11/2024   Stable overall, cont to avoid nephrotoxins

## 2024-05-18 NOTE — Assessment & Plan Note (Signed)
 Mod left wrist acute episode, for depomedrol 80 mg IM, and prednisone  taper, add colchicine  0.6 mg every day,  to f/u any worsening symptoms or concerns

## 2024-05-18 NOTE — Progress Notes (Signed)
 Patient ID: Gerald Day, male   DOB: 08-Aug-1956, 68 y.o.   MRN: [id number]        Chief Complaint: follow up acute gouty arthritis left wrist, dm, syncope, ckd3b, htn, low vit d       HPI:  Gerald Day is a 68 y.o. male here with c/o 2 days onset severe left wrist pain, redness and swelling without fever, or trauma, similar it seems to previous acute gout symptoms.  Good compliance with allopurinol  100 mg but tx limited due to ckd3b.  Pt denies chest pain, increased sob or doe, wheezing, orthopnea, PND, increased LE swelling, palpitations, except did have episode of syncope in the BR at home 2 days ago after BM and then standing up.  Has several abrasions small to left elbow, forehead but no significant swelling or other overt injury.  Seen per EMS, pt declined going to ED, and declines need for further eval today.   Pt denies polydipsia, polyuria, or new focal neuro s/s.  .       Wt Readings from Last 3 Encounters:  05/18/24 (!) 345 lb 3.2 oz (156.6 kg)  02/28/24 (!) 372 lb (168.7 kg)  01/11/24 (!) 372 lb (168.7 kg)   BP Readings from Last 3 Encounters:  05/18/24 132/74  02/28/24 120/65  01/11/24 134/78         Past Medical History:  Diagnosis Date   Arthritis    IN KNEES   Cellulitis 10/26/2013   LOWER RT EXTREMITY   CELLULITIS/ABSCESS, LEG 06/27/2007   COLONIC POLYPS    CONGESTIVE HEART FAILURE    DIABETES MELLITUS, TYPE II    DIVERTICULOSIS, COLON    ERECTILE DYSFUNCTION, ORGANIC    GOUT NOS    Headache    HYPERLIPIDEMIA    HYPERTENSION    LOW BACK PAIN    Morbid obesity (HCC)    SLEEP APNEA, OBSTRUCTIVE    USES CPAP   Past Surgical History:  Procedure Laterality Date   APPENDECTOMY     CARDIOVERSION N/A 08/02/2020   Procedure: CARDIOVERSION;  Surgeon: Francyne Headland, MD;  Location: [facility] ENDOSCOPY;  Service: Cardiovascular;  Laterality: N/A;   FOOT SURGERY     LT   TEE WITHOUT CARDIOVERSION N/A 08/02/2020   Procedure: TRANSESOPHAGEAL ECHOCARDIOGRAM (TEE);   Surgeon: Francyne Headland, MD;  Location: [facility] ENDOSCOPY;  Service: Cardiovascular;  Laterality: N/A;   TENOTOMY / FLEXOR TENDON TRANSFER Right 03/15/2015   Procedure: TENOTOMY HT REPAIR/ULCER DEBRIDEMENT/GRAFT PREP/ACELL GRAFT APPLICATION;  Surgeon: Selinda JAYSON Slovak, DPM;  Location: [facility] OR;  Service: Podiatry;  Laterality: Right;  HALLUX    reports that he has never smoked. He has never used smokeless tobacco. He reports that he does not currently use alcohol after a past usage of about 3.0 - 4.0 standard drinks of alcohol per week. He reports that he does not use drugs. family history includes Asthma in an other family member; Cardiomyopathy in his father. Allergies  Allergen Reactions   Penicillins Other (See Comments)    Unknown childhood allergic reaction Has patient had a PCN reaction causing immediate rash, facial/tongue/throat swelling, SOB or lightheadedness with hypotension: Unknown Has patient had a PCN reaction causing severe rash involving mucus membranes or skin necrosis: Unknown Has patient had a PCN reaction that required hospitalization: No Has patient had a PCN reaction occurring within the last 10 years: No If all of the above answers are NO, then may proceed with Cephalosporin use.    Tizanidine  Other (See Comments)  07/26/20: Pt does not recognize drug and does not remember being allergic to it.   Current Outpatient Medications on File Prior to Visit  Medication Sig Dispense Refill   allopurinol  (ZYLOPRIM ) 100 MG tablet TAKE 1 TABLET EVERY DAY 90 tablet 3   amLODipine  (NORVASC ) 10 MG tablet TAKE 1 TABLET EVERY DAY 90 tablet 3   apixaban  (ELIQUIS ) 5 MG TABS tablet Take 1 tablet (5 mg total) by mouth 2 (two) times daily. 180 tablet 3   atorvastatin  (LIPITOR) 20 MG tablet TAKE 1 TABLET EVERY DAY 90 tablet 3   carvedilol  (COREG ) 12.5 MG tablet TAKE 1 TABLET TWICE DAILY WITH MEALS 180 tablet 3   Cholecalciferol (THERA-D 2000) 50 MCG (2000 UT) TABS 1 tab by mouth once daily 90  tablet 99   cloNIDine  (CATAPRES ) 0.2 MG tablet TAKE 1 TABLET TWICE DAILY 180 tablet 3   furosemide  (LASIX ) 80 MG tablet TAKE 1 TABLET EVERY DAY 90 tablet 3   telmisartan  (MICARDIS ) 40 MG tablet Take 40 mg by mouth daily.     tirzepatide  (MOUNJARO ) 5 MG/0.5ML Pen Inject 5 mg into the skin once a week. 6 mL 3   vitamin B-12 (CYANOCOBALAMIN ) 1000 MCG tablet Take 1 tablet (1,000 mcg total) by mouth daily. 90 tablet 3   No current facility-administered medications on file prior to visit.        ROS:  All others reviewed and negative.  Objective        PE:  BP 132/74   Pulse 61   Temp 98.1 F (36.7 C)   Ht 6' (1.829 m)   Wt (!) 345 lb 3.2 oz (156.6 kg)   SpO2 99%   BMI 46.82 kg/m                 Constitutional: Pt appears in NAD               HENT: Head: NCAT.                Right Ear: External ear normal.                 Left Ear: External ear normal.                Eyes: . Pupils are equal, round, and reactive to light. Conjunctivae and EOM are normal               Nose: without d/c or deformity               Neck: Neck supple. Gross normal ROM               Cardiovascular: Normal rate and regular rhythm.                 Pulmonary/Chest: Effort normal and breath sounds without rales or wheezing.                Abd:  Soft, NT, ND, + BS, no organomegaly               Neurological: Pt is alert. At baseline orientation, motor grossly intact;  left wirst with 2+ red, tender swelling and reduced ROM               Skin: Skin is warm., LE edema - trace to 1+ bilateral chronic               Psychiatric: Pt behavior is normal without agitation   Micro: none  Cardiac tracings I  have personally interpreted today:  none  Pertinent Radiological findings (summarize): none   Lab Results  Component Value Date   WBC 8.8 01/11/2024   HGB 12.3 (L) 01/11/2024   HCT 36.4 (L) 01/11/2024   PLT 215.0 01/11/2024   GLUCOSE 287 (H) 01/11/2024   CHOL 130 01/11/2024   TRIG 140.0 01/11/2024   HDL  38.80 (L) 01/11/2024   LDLDIRECT 83.0 12/19/2018   LDLCALC 64 01/11/2024   ALT 15 01/11/2024   AST 11 01/11/2024   NA 135 01/11/2024   K 5.0 01/11/2024   CL 100 01/11/2024   CREATININE 2.51 (H) 01/11/2024   BUN 57 (H) 01/11/2024   CO2 26 01/11/2024   TSH 3.07 01/11/2024   PSA 0.72 01/11/2024   INR 1.4 (H) 07/26/2020   HGBA1C 9.8 (H) 01/11/2024   MICROALBUR 96.9 (H) 01/11/2024   Assessment/Plan:  KEINO PLACENCIA is a 69 y.o. White or Caucasian [1] male with  has a past medical history of Arthritis, Cellulitis (10/26/2013), CELLULITIS/ABSCESS, LEG (06/27/2007), COLONIC POLYPS, CONGESTIVE HEART FAILURE, DIABETES MELLITUS, TYPE II, DIVERTICULOSIS, COLON, ERECTILE DYSFUNCTION, ORGANIC, GOUT NOS, Headache, HYPERLIPIDEMIA, HYPERTENSION, LOW BACK PAIN, Morbid obesity (HCC), and SLEEP APNEA, OBSTRUCTIVE.  Acute gouty arthritis Mod left wrist acute episode, for depomedrol 80 mg IM, and prednisone  taper, add colchicine  0.6 mg every day,  to f/u any worsening symptoms or concerns  CKD stage 3b, GFR 30-44 ml/min (HCC) Lab Results  Component Value Date   CREATININE 2.51 (H) 01/11/2024   Stable overall, cont to avoid nephrotoxins  Diabetes mellitus type 2 in obese Lab Results  Component Value Date   HGBA1C 9.8 (H) 01/11/2024   uncontrolled, pt to continue current medical treatment mounjaro  5 mg weekly, has lost signficant wt loss with this, pt declines change today   Essential hypertension BP Readings from Last 3 Encounters:  05/18/24 132/74  02/28/24 120/65  01/11/24 134/78   Stable, pt to continue medical treatment norvasc  10 every day, coreg  12. 5 bid, clonidine  0.2 bid, micardis     Hyperlipidemia Lab Results  Component Value Date   LDLCALC 64 01/11/2024   Stable, pt to continue current statin lipitor 20 qd   Vitamin D  deficiency Last vitamin D  Lab Results  Component Value Date   VD25OH 26.36 (L) 01/11/2024   Low, to start oral replacement   Syncope C/w likely  vasovagal episode, pt declines ecg and labs today or referral to ED  Followup: Return in about 6 months (around 11/15/2024).  Lynwood Rush, MD 05/18/2024 8:08 PM [facility]

## 2024-05-18 NOTE — Assessment & Plan Note (Signed)
 Lab Results  Component Value Date   LDLCALC 64 01/11/2024   Stable, pt to continue current statin lipitor 20 qd

## 2024-05-18 NOTE — Patient Instructions (Addendum)
 You had the steroid shot today  Please take all new medication as prescribed - the prednisone  for a short time  Please take all new medication as prescribed - the colchicine  once per day indefinitely to help prevent the gout  Please continue all other medications as before, and refills have been done if requested.  Please have the pharmacy call with any other refills you may need  Please keep your appointments with your specialists as you may have planned  Please make an Appointment to return in 6 months, or sooner if needed

## 2024-06-16 DIAGNOSIS — E1122 Type 2 diabetes mellitus with diabetic chronic kidney disease: Secondary | ICD-10-CM | POA: Diagnosis not present

## 2024-06-16 DIAGNOSIS — D631 Anemia in chronic kidney disease: Secondary | ICD-10-CM | POA: Diagnosis not present

## 2024-06-16 DIAGNOSIS — N2581 Secondary hyperparathyroidism of renal origin: Secondary | ICD-10-CM | POA: Diagnosis not present

## 2024-06-16 DIAGNOSIS — I10 Essential (primary) hypertension: Secondary | ICD-10-CM | POA: Diagnosis not present

## 2024-06-16 DIAGNOSIS — N189 Chronic kidney disease, unspecified: Secondary | ICD-10-CM | POA: Diagnosis not present

## 2024-06-16 DIAGNOSIS — N184 Chronic kidney disease, stage 4 (severe): Secondary | ICD-10-CM | POA: Diagnosis not present

## 2024-06-16 DIAGNOSIS — N179 Acute kidney failure, unspecified: Secondary | ICD-10-CM | POA: Diagnosis not present

## 2024-06-16 DIAGNOSIS — E785 Hyperlipidemia, unspecified: Secondary | ICD-10-CM | POA: Diagnosis not present

## 2024-06-16 DIAGNOSIS — R809 Proteinuria, unspecified: Secondary | ICD-10-CM | POA: Diagnosis not present

## 2024-06-16 DIAGNOSIS — E1129 Type 2 diabetes mellitus with other diabetic kidney complication: Secondary | ICD-10-CM | POA: Diagnosis not present

## 2024-06-16 DIAGNOSIS — I130 Hypertensive heart and chronic kidney disease with heart failure and stage 1 through stage 4 chronic kidney disease, or unspecified chronic kidney disease: Secondary | ICD-10-CM | POA: Diagnosis not present

## 2024-07-03 ENCOUNTER — Telehealth: Payer: Self-pay

## 2024-07-03 MED ORDER — TIRZEPATIDE 7.5 MG/0.5ML ~~LOC~~ SOAJ: 7.5000 mg | SUBCUTANEOUS | 3 refills | Status: DC

## 2024-07-03 NOTE — Addendum Note (Signed)
 Addended by: NORLEEN LYNWOOD ORN on: 07/03/2024 04:42 PM   Modules accepted: Orders

## 2024-07-03 NOTE — Telephone Encounter (Signed)
 Copied from CRM [id number]. Topic: Clinical - Medication Question >> Jul 03, 2024  8:48 AM Gerald Day wrote: Reason for CRM: Patient was advised by Dr. Norleen to give a call once he has completed tirzepatide  (MOUNJARO ) 5 MG/0.5ML Pen to be moved to next dosage. Sent to [facility]

## 2024-07-03 NOTE — Telephone Encounter (Signed)
Ok this is done thanks

## 2024-07-13 ENCOUNTER — Ambulatory Visit (INDEPENDENT_AMBULATORY_CARE_PROVIDER_SITE_OTHER): Admitting: Internal Medicine

## 2024-07-13 ENCOUNTER — Encounter: Payer: Self-pay | Admitting: Internal Medicine

## 2024-07-13 ENCOUNTER — Ambulatory Visit: Payer: Self-pay | Admitting: Internal Medicine

## 2024-07-13 VITALS — BP 138/80 | HR 70 | Temp 98.0°F | Ht 72.0 in | Wt 334.0 lb

## 2024-07-13 DIAGNOSIS — I10 Essential (primary) hypertension: Secondary | ICD-10-CM

## 2024-07-13 DIAGNOSIS — N1832 Chronic kidney disease, stage 3b: Secondary | ICD-10-CM

## 2024-07-13 DIAGNOSIS — E538 Deficiency of other specified B group vitamins: Secondary | ICD-10-CM

## 2024-07-13 DIAGNOSIS — M109 Gout, unspecified: Secondary | ICD-10-CM

## 2024-07-13 DIAGNOSIS — E559 Vitamin D deficiency, unspecified: Secondary | ICD-10-CM | POA: Diagnosis not present

## 2024-07-13 DIAGNOSIS — Z23 Encounter for immunization: Secondary | ICD-10-CM | POA: Diagnosis not present

## 2024-07-13 DIAGNOSIS — E1122 Type 2 diabetes mellitus with diabetic chronic kidney disease: Secondary | ICD-10-CM

## 2024-07-13 LAB — HEMOGLOBIN A1C: Hgb A1c MFr Bld: 6.8 % — ABNORMAL HIGH (ref 4.6–6.5)

## 2024-07-13 LAB — BASIC METABOLIC PANEL WITH GFR
BUN: 68 mg/dL — ABNORMAL HIGH (ref 6–23)
CO2: 24 meq/L (ref 19–32)
Calcium: 9.1 mg/dL (ref 8.4–10.5)
Chloride: 103 meq/L (ref 96–112)
Creatinine, Ser: 3.14 mg/dL — ABNORMAL HIGH (ref 0.40–1.50)
GFR: 19.45 mL/min — ABNORMAL LOW (ref >60.00)
Glucose, Bld: 152 mg/dL — ABNORMAL HIGH (ref 70–99)
Potassium: 4.8 meq/L (ref 3.5–5.1)
Sodium: 136 meq/L (ref 135–145)

## 2024-07-13 LAB — LIPID PANEL
Cholesterol: 86 mg/dL (ref 0–200)
HDL: 29.8 mg/dL — ABNORMAL LOW (ref >39.00)
LDL Cholesterol: 37 mg/dL (ref 0–99)
NonHDL: 55.97
Total CHOL/HDL Ratio: 3
Triglycerides: 97 mg/dL (ref 0.0–149.0)
VLDL: 19.4 mg/dL (ref 0.0–40.0)

## 2024-07-13 LAB — HEPATIC FUNCTION PANEL
ALT: 10 U/L (ref 0–53)
AST: 8 U/L (ref 0–37)
Albumin: 3.9 g/dL (ref 3.5–5.2)
Alkaline Phosphatase: 85 U/L (ref 39–117)
Bilirubin, Direct: 0.1 mg/dL (ref 0.0–0.3)
Total Bilirubin: 0.5 mg/dL (ref 0.2–1.2)
Total Protein: 7.3 g/dL (ref 6.0–8.3)

## 2024-07-13 NOTE — Patient Instructions (Addendum)
 You had the flu shot today  Please continue all other medications as before, and let us  know in 1 or more months if you would need the increased dose for the mounjaro   Please have the pharmacy call with any other refills you may need.  Please continue your efforts at being more active, low cholesterol diet, and weight control.  Please keep your appointments with your specialists as you may have planned - kidney doctor in Jan 2026  Please go to the LAB at the blood drawing area for the tests to be done  You will be contacted by phone if any changes need to be made immediately.  Otherwise, you will receive a letter about your results with an explanation, but please check with MyChart first.  Please make an Appointment to return in 6 months, or sooner if needed

## 2024-07-13 NOTE — Progress Notes (Signed)
 Patient ID: Gerald Day, male   DOB: 1956/05/18, 68 y.o.   MRN: [id number]        Chief Complaint: follow up HTN, HLD, DM with CKD 3b and low vit d       HPI:  Gerald Day is a 68 y.o. male here overall doing ok, has lost significant wt with mounjaro , Pt denies chest pain, increased sob or doe, wheezing, orthopnea, PND, increased LE swelling, palpitations, dizziness or syncope.   Pt denies polydipsia, polyuria, or new focal neuro s/s.    Pt denies fever, wt loss, night sweats, loss of appetite, or other constitutional symptoms  Due for flu shot.  Did see renal recently and has reported stable renal fx.  Has f/u there jan 2026       Wt Readings from Last 3 Encounters:  07/13/24 (!) 334 lb (151.5 kg)  05/18/24 (!) 345 lb 3.2 oz (156.6 kg)  02/28/24 (!) 372 lb (168.7 kg)   BP Readings from Last 3 Encounters:  07/13/24 138/80  05/18/24 132/74  02/28/24 120/65         Past Medical History:  Diagnosis Date   Arthritis    IN KNEES   Cellulitis 10/26/2013   LOWER RT EXTREMITY   CELLULITIS/ABSCESS, LEG 06/27/2007   COLONIC POLYPS    CONGESTIVE HEART FAILURE    DIABETES MELLITUS, TYPE II    DIVERTICULOSIS, COLON    ERECTILE DYSFUNCTION, ORGANIC    GOUT NOS    Headache    HYPERLIPIDEMIA    HYPERTENSION    LOW BACK PAIN    Morbid obesity (HCC)    SLEEP APNEA, OBSTRUCTIVE    USES CPAP   Past Surgical History:  Procedure Laterality Date   APPENDECTOMY     CARDIOVERSION N/A 08/02/2020   Procedure: CARDIOVERSION;  Surgeon: Francyne Headland, MD;  Location: [facility] ENDOSCOPY;  Service: Cardiovascular;  Laterality: N/A;   FOOT SURGERY     LT   TEE WITHOUT CARDIOVERSION N/A 08/02/2020   Procedure: TRANSESOPHAGEAL ECHOCARDIOGRAM (TEE);  Surgeon: Francyne Headland, MD;  Location: [facility] ENDOSCOPY;  Service: Cardiovascular;  Laterality: N/A;   TENOTOMY / FLEXOR TENDON TRANSFER Right 03/15/2015   Procedure: TENOTOMY HT REPAIR/ULCER DEBRIDEMENT/GRAFT PREP/ACELL GRAFT APPLICATION;  Surgeon: Selinda JAYSON Slovak, DPM;  Location: [facility] OR;  Service: Podiatry;  Laterality: Right;  HALLUX    reports that he has never smoked. He has never used smokeless tobacco. He reports that he does not currently use alcohol after a past usage of about 3.0 - 4.0 standard drinks of alcohol per week. He reports that he does not use drugs. family history includes Asthma in an other family member; Cardiomyopathy in his father. Allergies  Allergen Reactions   Penicillins Other (See Comments)    Unknown childhood allergic reaction Has patient had a PCN reaction causing immediate rash, facial/tongue/throat swelling, SOB or lightheadedness with hypotension: Unknown Has patient had a PCN reaction causing severe rash involving mucus membranes or skin necrosis: Unknown Has patient had a PCN reaction that required hospitalization: No Has patient had a PCN reaction occurring within the last 10 years: No If all of the above answers are NO, then may proceed with Cephalosporin use.    Tizanidine  Other (See Comments)    07/26/20: Pt does not recognize drug and does not remember being allergic to it.   Current Outpatient Medications on File Prior to Visit  Medication Sig Dispense Refill   allopurinol  (ZYLOPRIM ) 100 MG tablet TAKE 1 TABLET EVERY DAY 90 tablet  3   amLODipine  (NORVASC ) 10 MG tablet TAKE 1 TABLET EVERY DAY 90 tablet 3   apixaban  (ELIQUIS ) 5 MG TABS tablet Take 1 tablet (5 mg total) by mouth 2 (two) times daily. 180 tablet 3   atorvastatin  (LIPITOR) 20 MG tablet TAKE 1 TABLET EVERY DAY 90 tablet 3   carvedilol  (COREG ) 12.5 MG tablet TAKE 1 TABLET TWICE DAILY WITH MEALS 180 tablet 3   Cholecalciferol (THERA-D 2000) 50 MCG (2000 UT) TABS 1 tab by mouth once daily 90 tablet 99   cloNIDine  (CATAPRES ) 0.2 MG tablet TAKE 1 TABLET TWICE DAILY 180 tablet 3   colchicine  0.6 MG tablet Take 1 tablet (0.6 mg total) by mouth daily. 90 tablet 3   furosemide  (LASIX ) 80 MG tablet TAKE 1 TABLET EVERY DAY 90 tablet 3   predniSONE   (DELTASONE ) 10 MG tablet 3 tabs by mouth per day for 3 days,2tabs per day for 3 days,1tab per day for 3 days 18 tablet 0   telmisartan  (MICARDIS ) 40 MG tablet Take 20 mg by mouth daily.     tirzepatide  (MOUNJARO ) 7.5 MG/0.5ML Pen Inject 7.5 mg into the skin once a week. 6 mL 3   vitamin B-12 (CYANOCOBALAMIN ) 1000 MCG tablet Take 1 tablet (1,000 mcg total) by mouth daily. 90 tablet 3   No current facility-administered medications on file prior to visit.        ROS:  All others reviewed and negative.  Objective        PE:  BP 138/80 (BP Location: Right Arm, Patient Position: Sitting, Cuff Size: Normal)   Pulse 70   Temp 98 F (36.7 C) (Oral)   Ht 6' (1.829 m)   Wt (!) 334 lb (151.5 kg)   SpO2 97%   BMI 45.30 kg/m                 Constitutional: Pt appears in NAD               HENT: Head: NCAT.                Right Ear: External ear normal.                 Left Ear: External ear normal.                Eyes: . Pupils are equal, round, and reactive to light. Conjunctivae and EOM are normal               Nose: without d/c or deformity               Neck: Neck supple. Gross normal ROM               Cardiovascular: Normal rate and regular rhythm.                 Pulmonary/Chest: Effort normal and breath sounds without rales or wheezing.                Abd:  Soft, NT, ND, + BS, no organomegaly               Neurological: Pt is alert. At baseline orientation, motor grossly intact               Skin: Skin is warm. No rashes, no other new lesions, LE edema - none               Psychiatric: Pt behavior is normal without agitation   Micro: none  Cardiac tracings I have personally interpreted today:  none  Pertinent Radiological findings (summarize): none   Lab Results  Component Value Date   WBC 8.8 01/11/2024   HGB 12.3 (L) 01/11/2024   HCT 36.4 (L) 01/11/2024   PLT 215.0 01/11/2024   GLUCOSE 152 (H) 07/13/2024   CHOL 86 07/13/2024   TRIG 97.0 07/13/2024   HDL 29.80 (L)  07/13/2024   LDLDIRECT 83.0 12/19/2018   LDLCALC 37 07/13/2024   ALT 10 07/13/2024   AST 8 07/13/2024   NA 136 07/13/2024   K 4.8 07/13/2024   CL 103 07/13/2024   CREATININE 3.14 (H) 07/13/2024   BUN 68 (H) 07/13/2024   CO2 24 07/13/2024   TSH 3.07 01/11/2024   PSA 0.72 01/11/2024   INR 1.4 (H) 07/26/2020   HGBA1C 6.8 (H) 07/13/2024   MICROALBUR 96.9 (H) 01/11/2024   Assessment/Plan:  Gerald Day is a 68 y.o. White or Caucasian [1] male with  has a past medical history of Arthritis, Cellulitis (10/26/2013), CELLULITIS/ABSCESS, LEG (06/27/2007), COLONIC POLYPS, CONGESTIVE HEART FAILURE, DIABETES MELLITUS, TYPE II, DIVERTICULOSIS, COLON, ERECTILE DYSFUNCTION, ORGANIC, GOUT NOS, Headache, HYPERLIPIDEMIA, HYPERTENSION, LOW BACK PAIN, Morbid obesity (HCC), and SLEEP APNEA, OBSTRUCTIVE.  Vitamin D  deficiency Last vitamin D  Lab Results  Component Value Date   VD25OH 26.36 (L) 01/11/2024   Low, to start oral replacement   Essential hypertension BP Readings from Last 3 Encounters:  07/13/24 138/80  05/18/24 132/74  02/28/24 120/65   Stable, pt to continue medical treatment norvasc  10 every day, coreg  12.5 bid, catapres  0.2 mg, micardis  40 qd   Diabetes mellitus with chronic kidney disease (HCC) Lab Results  Component Value Date   CREATININE 3.14 (H) 07/13/2024   Stable overall, cont to avoid nephrotoxins, for f/u renal jan 2026 [id number] Lab Results  Component Value Date   HGBA1C 6.8 (H) 07/13/2024   Stable, pt to continue current medical treatment mounjaro  7.5 mg weekly, no change for now     B12 deficiency Lab Results  Component Value Date   VITAMINB12 1,105 (H) 01/11/2024   Stable, cont oral replacement - b12 1000 mcg qd  Followup: Return in about 6 months (around 01/10/2025).  Lynwood Rush, MD 07/16/2024 1:46 PM  [facility]

## 2024-07-16 ENCOUNTER — Encounter: Payer: Self-pay | Admitting: Internal Medicine

## 2024-07-16 NOTE — Assessment & Plan Note (Signed)
 Lab Results  Component Value Date   CREATININE 3.14 (H) 07/13/2024   Stable overall, cont to avoid nephrotoxins, for f/u renal jan 2026 [id number] Lab Results  Component Value Date   HGBA1C 6.8 (H) 07/13/2024   Stable, pt to continue current medical treatment mounjaro  7.5 mg weekly, no change for now

## 2024-07-16 NOTE — Assessment & Plan Note (Signed)
 Lab Results  Component Value Date   VITAMINB12 1,105 (H) 01/11/2024   Stable, cont oral replacement - b12 1000 mcg qd

## 2024-07-16 NOTE — Assessment & Plan Note (Signed)
 Last vitamin D  Lab Results  Component Value Date   VD25OH 26.36 (L) 01/11/2024   Low, to start oral replacement

## 2024-07-16 NOTE — Assessment & Plan Note (Signed)
 BP Readings from Last 3 Encounters:  07/13/24 138/80  05/18/24 132/74  02/28/24 120/65   Stable, pt to continue medical treatment norvasc  10 every day, coreg  12.5 bid, catapres  0.2 mg, micardis  40 qd

## 2024-08-02 ENCOUNTER — Encounter: Payer: Self-pay | Admitting: Pharmacist

## 2024-08-02 NOTE — Progress Notes (Signed)
 Pharmacy Quality Measure Review  This patient is appearing on a report for being at risk of failing the adherence measure for diabetes medications this calendar year.   Medication: Mounjaro    Last fill date: 07/03/24 for 84 day supply  Insurance report was not up to date. No action needed at this time.    Darrelyn Drum, PharmD, BCPS, CPP Clinical Pharmacist Practitioner [facility] [phone]

## 2024-08-16 NOTE — Progress Notes (Signed)
 Pharmacy Quality Measure Review  This patient is appearing on a report for being at risk of failing the adherence measure for hypertension (ACEi/ARB) medications this calendar year.   Medication: Telmisartan   Last fill date: 08/15/24 for 90 day supply  Insurance report was not up to date. No action needed at this time.   Darrelyn Drum, PharmD, BCPS, CPP Clinical Pharmacist Practitioner [facility] [phone]

## 2024-09-14 ENCOUNTER — Telehealth: Payer: Self-pay | Admitting: Internal Medicine

## 2024-09-14 ENCOUNTER — Other Ambulatory Visit: Payer: Self-pay

## 2024-09-14 MED ORDER — TIRZEPATIDE 7.5 MG/0.5ML ~~LOC~~ SOAJ: 7.5000 mg | SUBCUTANEOUS | 3 refills | Status: AC

## 2024-09-14 NOTE — Telephone Encounter (Unsigned)
 Copied from CRM #[id number]. Topic: Clinical - Medication Refill >> Sep 14, 2024  1:10 PM Deaijah H wrote: Medication: tirzepatide  (MOUNJARO ) 7.5 MG/0.5ML Pen  Has the patient contacted their pharmacy? No (Agent: If no, request that the patient contact the pharmacy for the refill. If patient does not wish to contact the pharmacy document the reason why and proceed with request.) (Agent: If yes, when and what did the pharmacy advise?)  This is the patient's preferred pharmacy:  [facility] - [place], [place] - [address] [place] [address] Phone: [phone] Fax: [phone]  Is this the correct pharmacy for this prescription? Yes If no, delete pharmacy and type the correct one.   Has the prescription been filled recently? No  Is the patient out of the medication? No, 1 more week  Has the patient been seen for an appointment in the last year OR does the patient have an upcoming appointment? Yes  Can we respond through MyChart? No  Agent: Please be advised that Rx refills may take up to 3 business days. We ask that you follow-up with your pharmacy.

## 2024-09-26 LAB — LAB REPORT - SCANNED: EGFR: 24

## 2025-01-11 ENCOUNTER — Encounter: Admitting: Family Medicine

## 2025-02-28 ENCOUNTER — Ambulatory Visit
